# Patient Record
Sex: Female | Born: 1950 | Race: White | Hispanic: No | Smoking: Never smoker
Health system: Southern US, Community
[De-identification: ages and names within clinical notes are randomized; demographics above are authoritative.]

## PROBLEM LIST (undated history)

## (undated) DIAGNOSIS — F988 Other specified behavioral and emotional disorders with onset usually occurring in childhood and adolescence: Secondary | ICD-10-CM

## (undated) DIAGNOSIS — M810 Age-related osteoporosis without current pathological fracture: Secondary | ICD-10-CM

## (undated) DIAGNOSIS — I10 Essential (primary) hypertension: Secondary | ICD-10-CM

## (undated) DIAGNOSIS — F419 Anxiety disorder, unspecified: Secondary | ICD-10-CM

## (undated) DIAGNOSIS — Z9889 Other specified postprocedural states: Secondary | ICD-10-CM

## (undated) DIAGNOSIS — E559 Vitamin D deficiency, unspecified: Secondary | ICD-10-CM

## (undated) DIAGNOSIS — F329 Major depressive disorder, single episode, unspecified: Secondary | ICD-10-CM

## (undated) DIAGNOSIS — E785 Hyperlipidemia, unspecified: Secondary | ICD-10-CM

## (undated) DIAGNOSIS — E039 Hypothyroidism, unspecified: Secondary | ICD-10-CM

## (undated) DIAGNOSIS — K219 Gastro-esophageal reflux disease without esophagitis: Secondary | ICD-10-CM

## (undated) DIAGNOSIS — R112 Nausea with vomiting, unspecified: Secondary | ICD-10-CM

## (undated) DIAGNOSIS — F32A Depression, unspecified: Secondary | ICD-10-CM

## (undated) DIAGNOSIS — T7840XA Allergy, unspecified, initial encounter: Secondary | ICD-10-CM

## (undated) DIAGNOSIS — G4730 Sleep apnea, unspecified: Secondary | ICD-10-CM

## (undated) DIAGNOSIS — M1990 Unspecified osteoarthritis, unspecified site: Secondary | ICD-10-CM

## (undated) DIAGNOSIS — D649 Anemia, unspecified: Secondary | ICD-10-CM

## (undated) HISTORY — DX: Unspecified osteoarthritis, unspecified site: M19.90

## (undated) HISTORY — DX: Vitamin D deficiency, unspecified: E55.9

## (undated) HISTORY — DX: Allergy, unspecified, initial encounter: T78.40XA

## (undated) HISTORY — DX: Gastro-esophageal reflux disease without esophagitis: K21.9

## (undated) HISTORY — DX: Depression, unspecified: F32.A

## (undated) HISTORY — DX: Anemia, unspecified: D64.9

## (undated) HISTORY — PX: FRACTURE SURGERY: SHX138

## (undated) HISTORY — DX: Essential (primary) hypertension: I10

## (undated) HISTORY — DX: Hypothyroidism, unspecified: E03.9

## (undated) HISTORY — DX: Hyperlipidemia, unspecified: E78.5

## (undated) HISTORY — DX: Other specified behavioral and emotional disorders with onset usually occurring in childhood and adolescence: F98.8

## (undated) HISTORY — DX: Age-related osteoporosis without current pathological fracture: M81.0

## (undated) HISTORY — DX: Major depressive disorder, single episode, unspecified: F32.9

## (undated) HISTORY — DX: Anxiety disorder, unspecified: F41.9

## (undated) HISTORY — DX: Sleep apnea, unspecified: G47.30

## (undated) HISTORY — PX: FOOT SURGERY: SHX648

---

## 1998-01-02 ENCOUNTER — Other Ambulatory Visit: Admission: RE | Admit: 1998-01-02 | Discharge: 1998-01-02 | Payer: Self-pay | Admitting: Orthopedic Surgery

## 1998-06-04 ENCOUNTER — Other Ambulatory Visit: Admission: RE | Admit: 1998-06-04 | Discharge: 1998-06-04 | Payer: Self-pay | Admitting: *Deleted

## 1998-11-25 ENCOUNTER — Encounter: Payer: Self-pay | Admitting: Pulmonary Disease

## 1998-11-25 ENCOUNTER — Ambulatory Visit: Admission: RE | Admit: 1998-11-25 | Discharge: 1998-11-25 | Payer: Self-pay | Admitting: Otolaryngology

## 1999-01-15 ENCOUNTER — Encounter: Payer: Self-pay | Admitting: Pulmonary Disease

## 1999-06-03 ENCOUNTER — Encounter (INDEPENDENT_AMBULATORY_CARE_PROVIDER_SITE_OTHER): Payer: Self-pay | Admitting: Specialist

## 1999-06-03 ENCOUNTER — Other Ambulatory Visit: Admission: RE | Admit: 1999-06-03 | Discharge: 1999-06-03 | Payer: Self-pay | Admitting: *Deleted

## 1999-08-19 ENCOUNTER — Other Ambulatory Visit: Admission: RE | Admit: 1999-08-19 | Discharge: 1999-08-19 | Payer: Self-pay | Admitting: *Deleted

## 2000-02-22 HISTORY — PX: TONSILLECTOMY: SUR1361

## 2000-08-03 ENCOUNTER — Encounter (INDEPENDENT_AMBULATORY_CARE_PROVIDER_SITE_OTHER): Payer: Self-pay | Admitting: *Deleted

## 2000-08-03 ENCOUNTER — Ambulatory Visit (HOSPITAL_COMMUNITY): Admission: RE | Admit: 2000-08-03 | Discharge: 2000-08-04 | Payer: Self-pay | Admitting: *Deleted

## 2000-09-26 ENCOUNTER — Other Ambulatory Visit: Admission: RE | Admit: 2000-09-26 | Discharge: 2000-09-26 | Payer: Self-pay | Admitting: *Deleted

## 2000-11-02 ENCOUNTER — Ambulatory Visit (HOSPITAL_BASED_OUTPATIENT_CLINIC_OR_DEPARTMENT_OTHER): Admission: RE | Admit: 2000-11-02 | Discharge: 2000-11-02 | Payer: Self-pay | Admitting: *Deleted

## 2001-01-08 ENCOUNTER — Inpatient Hospital Stay (HOSPITAL_COMMUNITY): Admission: EM | Admit: 2001-01-08 | Discharge: 2001-01-17 | Payer: Self-pay

## 2001-01-08 ENCOUNTER — Encounter: Payer: Self-pay | Admitting: Orthopedic Surgery

## 2001-01-09 ENCOUNTER — Encounter: Payer: Self-pay | Admitting: Orthopedic Surgery

## 2001-01-09 HISTORY — PX: IM NAILING TIBIA: SUR734

## 2001-01-10 ENCOUNTER — Encounter: Payer: Self-pay | Admitting: Orthopedic Surgery

## 2001-01-10 ENCOUNTER — Encounter: Payer: Self-pay | Admitting: Dentistry

## 2001-09-26 ENCOUNTER — Other Ambulatory Visit: Admission: RE | Admit: 2001-09-26 | Discharge: 2001-09-26 | Payer: Self-pay | Admitting: *Deleted

## 2002-06-20 ENCOUNTER — Ambulatory Visit (HOSPITAL_COMMUNITY): Admission: RE | Admit: 2002-06-20 | Discharge: 2002-06-20 | Payer: Self-pay | Admitting: Gastroenterology

## 2002-09-30 ENCOUNTER — Other Ambulatory Visit: Admission: RE | Admit: 2002-09-30 | Discharge: 2002-09-30 | Payer: Self-pay | Admitting: *Deleted

## 2003-03-18 ENCOUNTER — Ambulatory Visit (HOSPITAL_COMMUNITY): Admission: RE | Admit: 2003-03-18 | Discharge: 2003-03-18 | Payer: Self-pay | Admitting: *Deleted

## 2003-10-01 ENCOUNTER — Other Ambulatory Visit: Admission: RE | Admit: 2003-10-01 | Discharge: 2003-10-01 | Payer: Self-pay | Admitting: *Deleted

## 2003-10-13 ENCOUNTER — Encounter: Admission: RE | Admit: 2003-10-13 | Discharge: 2003-10-13 | Payer: Self-pay | Admitting: *Deleted

## 2004-04-20 ENCOUNTER — Ambulatory Visit (HOSPITAL_COMMUNITY): Admission: RE | Admit: 2004-04-20 | Discharge: 2004-04-20 | Payer: Self-pay | Admitting: *Deleted

## 2004-10-12 ENCOUNTER — Other Ambulatory Visit: Admission: RE | Admit: 2004-10-12 | Discharge: 2004-10-12 | Payer: Self-pay | Admitting: *Deleted

## 2005-06-07 ENCOUNTER — Ambulatory Visit (HOSPITAL_COMMUNITY): Admission: RE | Admit: 2005-06-07 | Discharge: 2005-06-07 | Payer: Self-pay | Admitting: Family Medicine

## 2005-09-02 ENCOUNTER — Encounter: Admission: RE | Admit: 2005-09-02 | Discharge: 2005-09-02 | Payer: Self-pay | Admitting: Gastroenterology

## 2005-11-03 ENCOUNTER — Other Ambulatory Visit: Admission: RE | Admit: 2005-11-03 | Discharge: 2005-11-03 | Payer: Self-pay | Admitting: *Deleted

## 2005-12-20 ENCOUNTER — Encounter: Admission: RE | Admit: 2005-12-20 | Discharge: 2005-12-20 | Payer: Self-pay | Admitting: *Deleted

## 2006-08-14 ENCOUNTER — Ambulatory Visit (HOSPITAL_COMMUNITY): Admission: RE | Admit: 2006-08-14 | Discharge: 2006-08-14 | Payer: Self-pay | Admitting: *Deleted

## 2006-11-21 ENCOUNTER — Other Ambulatory Visit: Admission: RE | Admit: 2006-11-21 | Discharge: 2006-11-21 | Payer: Self-pay | Admitting: *Deleted

## 2007-04-24 ENCOUNTER — Emergency Department (HOSPITAL_COMMUNITY): Admission: EM | Admit: 2007-04-24 | Discharge: 2007-04-24 | Payer: Self-pay | Admitting: Family Medicine

## 2007-08-08 ENCOUNTER — Encounter: Admission: RE | Admit: 2007-08-08 | Discharge: 2007-08-08 | Payer: Self-pay | Admitting: Gastroenterology

## 2007-08-28 ENCOUNTER — Ambulatory Visit (HOSPITAL_COMMUNITY): Admission: RE | Admit: 2007-08-28 | Discharge: 2007-08-28 | Payer: Self-pay | Admitting: *Deleted

## 2007-09-12 ENCOUNTER — Encounter: Admission: RE | Admit: 2007-09-12 | Discharge: 2007-09-12 | Payer: Self-pay | Admitting: Gastroenterology

## 2007-10-23 LAB — CONVERTED CEMR LAB: Pap Smear: NORMAL

## 2007-11-16 ENCOUNTER — Ambulatory Visit: Payer: Self-pay | Admitting: Pulmonary Disease

## 2007-11-16 DIAGNOSIS — R51 Headache: Secondary | ICD-10-CM

## 2007-11-16 DIAGNOSIS — Z86718 Personal history of other venous thrombosis and embolism: Secondary | ICD-10-CM | POA: Insufficient documentation

## 2007-11-16 DIAGNOSIS — J309 Allergic rhinitis, unspecified: Secondary | ICD-10-CM | POA: Insufficient documentation

## 2007-11-16 DIAGNOSIS — G4733 Obstructive sleep apnea (adult) (pediatric): Secondary | ICD-10-CM | POA: Insufficient documentation

## 2007-11-22 ENCOUNTER — Encounter: Payer: Self-pay | Admitting: Pulmonary Disease

## 2007-11-22 ENCOUNTER — Ambulatory Visit (HOSPITAL_BASED_OUTPATIENT_CLINIC_OR_DEPARTMENT_OTHER): Admission: RE | Admit: 2007-11-22 | Discharge: 2007-11-22 | Payer: Self-pay | Admitting: Pulmonary Disease

## 2007-12-15 ENCOUNTER — Ambulatory Visit: Payer: Self-pay | Admitting: Pulmonary Disease

## 2007-12-17 ENCOUNTER — Telehealth (INDEPENDENT_AMBULATORY_CARE_PROVIDER_SITE_OTHER): Payer: Self-pay | Admitting: *Deleted

## 2007-12-26 ENCOUNTER — Ambulatory Visit: Payer: Self-pay | Admitting: Pulmonary Disease

## 2008-01-16 ENCOUNTER — Telehealth (INDEPENDENT_AMBULATORY_CARE_PROVIDER_SITE_OTHER): Payer: Self-pay | Admitting: *Deleted

## 2008-01-21 ENCOUNTER — Ambulatory Visit: Payer: Self-pay | Admitting: Pulmonary Disease

## 2008-03-21 ENCOUNTER — Encounter: Payer: Self-pay | Admitting: Pulmonary Disease

## 2008-04-25 ENCOUNTER — Ambulatory Visit: Payer: Self-pay | Admitting: Internal Medicine

## 2008-04-25 DIAGNOSIS — D509 Iron deficiency anemia, unspecified: Secondary | ICD-10-CM | POA: Insufficient documentation

## 2008-04-25 DIAGNOSIS — E039 Hypothyroidism, unspecified: Secondary | ICD-10-CM | POA: Insufficient documentation

## 2008-04-25 LAB — CONVERTED CEMR LAB
ALT: 22 units/L (ref 0–35)
AST: 17 units/L (ref 0–37)
Albumin: 4 g/dL (ref 3.5–5.2)
Alkaline Phosphatase: 95 units/L (ref 39–117)
BUN: 9 mg/dL (ref 6–23)
Bacteria, UA: NEGATIVE
Basophils Absolute: 0.1 10*3/uL (ref 0.0–0.1)
Basophils Relative: 1.7 % (ref 0.0–3.0)
Bilirubin Urine: NEGATIVE
Bilirubin, Direct: 0.1 mg/dL (ref 0.0–0.3)
CO2: 32 meq/L (ref 19–32)
Calcium: 9.6 mg/dL (ref 8.4–10.5)
Chloride: 104 meq/L (ref 96–112)
Cholesterol: 233 mg/dL (ref 0–200)
Creatinine, Ser: 0.9 mg/dL (ref 0.4–1.2)
Crystals: NEGATIVE
Direct LDL: 138.2 mg/dL
Eosinophils Absolute: 0 10*3/uL (ref 0.0–0.7)
Eosinophils Relative: 0.3 % (ref 0.0–5.0)
GFR calc Af Amer: 83 mL/min
GFR calc non Af Amer: 69 mL/min
Glucose, Bld: 93 mg/dL (ref 70–99)
HCT: 39.5 % (ref 36.0–46.0)
HDL: 48.6 mg/dL (ref >39.0)
Hemoglobin, Urine: NEGATIVE
Hemoglobin: 13.7 g/dL (ref 12.0–15.0)
Ketones, ur: NEGATIVE mg/dL
Lymphocytes Relative: 25.3 % (ref 12.0–46.0)
MCHC: 34.7 g/dL (ref 30.0–36.0)
MCV: 87.1 fL (ref 78.0–100.0)
Monocytes Absolute: 0.5 10*3/uL (ref 0.1–1.0)
Monocytes Relative: 7.1 % (ref 3.0–12.0)
Mucus, UA: NEGATIVE
Neutro Abs: 4.3 10*3/uL (ref 1.4–7.7)
Neutrophils Relative %: 65.6 % (ref 43.0–77.0)
Nitrite: NEGATIVE
Platelets: 282 10*3/uL (ref 150–400)
Potassium: 3.7 meq/L (ref 3.5–5.1)
RBC / HPF: NONE SEEN
RBC: 4.53 M/uL (ref 3.87–5.11)
RDW: 12.7 % (ref 11.5–14.6)
Sodium: 143 meq/L (ref 135–145)
Specific Gravity, Urine: <=1.005 (ref 1.000–1.035)
TSH: 0.45 microintl units/mL (ref 0.35–5.50)
Total Bilirubin: 0.8 mg/dL (ref 0.3–1.2)
Total CHOL/HDL Ratio: 4.8
Total Protein, Urine: NEGATIVE mg/dL
Total Protein: 6.4 g/dL (ref 6.0–8.3)
Triglycerides: 217 mg/dL (ref 0–149)
Urine Glucose: NEGATIVE mg/dL
Urobilinogen, UA: 0.2 (ref 0.0–1.0)
VLDL: 43 mg/dL — ABNORMAL HIGH (ref 0–40)
WBC: 6.6 10*3/uL (ref 4.5–10.5)
pH: 6.5 (ref 5.0–8.0)

## 2008-04-28 LAB — CONVERTED CEMR LAB: Vit D, 25-Hydroxy: 26 ng/mL — ABNORMAL LOW (ref 30–89)

## 2008-09-16 ENCOUNTER — Ambulatory Visit (HOSPITAL_COMMUNITY): Admission: RE | Admit: 2008-09-16 | Discharge: 2008-09-16 | Payer: Self-pay | Admitting: Internal Medicine

## 2008-12-06 ENCOUNTER — Encounter: Admission: RE | Admit: 2008-12-06 | Discharge: 2008-12-06 | Payer: Self-pay | Admitting: Family Medicine

## 2009-10-21 ENCOUNTER — Ambulatory Visit (HOSPITAL_COMMUNITY): Admission: RE | Admit: 2009-10-21 | Discharge: 2009-10-21 | Payer: Self-pay | Admitting: Family Medicine

## 2010-03-25 NOTE — Consult Note (Signed)
Summary: Sleep Medicine/[facility]  Sleep Medicine/[facility]   Imported By: Esmeralda Links D'jimraou 11/19/2007 13:28:08  _____________________________________________________________________  External Attachment:    Type:   Image     Comment:   External Document

## 2010-03-25 NOTE — Progress Notes (Signed)
Summary: fax request/ surgery pend  Phone Note From Other Clinic   Caller: Nicholos Johns @ [facility] Call For: clance Summary of Call: needs sleep study and ov faxed to them as pt has surgery pending next week.  fax to attnNicholos Johns at [phone]. office ph# is [phone] Initial call taken by: Tivis Ringer,  January 16, 2008 11:34 AM  Follow-up for Phone Call        Faxed last OV and sleep study report to fax number provided. Attempted to notify Nicholos Johns.  [facility] records faxed.  Follow-up by: Cloyde Reams RN,  January 16, 2008 11:51 AM

## 2010-03-25 NOTE — Progress Notes (Signed)
Summary: needs ov with kc to discuss sleep study results  ---- Converted from flag ---- ---- 12/17/2007 9:58 AM, Cyndia Diver LPN wrote: pt needs ov with KC to discuss sleep study results. ------------------------------  LMOMTCB.  Cyndia Diver LPN  December 17, 2007 1:47 PM  pt is scheduled to see dr. Shelle Iron nov 4 at 12:00pm.  Cyndia Diver LPN  December 17, 2007 2:18 PM

## 2010-03-25 NOTE — Assessment & Plan Note (Signed)
Summary: NEW TO ESTABLISH/[employer]/$50/CD   Vital Signs:  Patient Profile:   60 Years Old Female Weight:      155.50 pounds O2 Sat:      92 % Temp:     97.6 degrees F oral Pulse rate:   72 / minute BP sitting:   132 / 80  (right arm) Cuff size:   regular  Vitals Entered By: Windell Norfolk (April 25, 2008 1:15 PM)                 Preventive Care Screening  Last Tetanus Booster:    Date:  04/25/2008    Next Due:  04/2018    Results:  Tdap  Colonoscopy:    Date:  02/21/2002    Next Due:  02/2012    Results:  normal   Bone Density:    Date:  11/22/2006    Next Due:  11/2008    Results:  normal std dev  Last Flu Shot:    Date:  11/22/2007    Results:  given   Pap Smear:    Date:  10/23/2007    Results:  normal   Mammogram:    Date:  07/23/2007    Results:  normal      declines h1n1 shot   Referred by:  Self Referral  Chief Complaint:  new pt .  History of Present Illness: overall doing well - did notice some lumps to the back of the neck she thinks may be lymph nodes but not sure; no URI symtoms or other in the recent past; no recent cxr; no wt loss, night sweats or other constitutional symptoms, good compliance with meds     Updated Prior Medication List: NEXIUM 40 MG  CPDR (ESOMEPRAZOLE MAGNESIUM) Take 1 tablet by mouth two times a day WELLBUTRIN XL 300 MG XR24H-TAB (BUPROPION HCL) Take 1 tablet by mouth once a day FEXOFENADINE HCL 60 MG TABS (FEXOFENADINE HCL) Take 1 tablet by mouth once a day ADDERALL 15 MG TABS (AMPHETAMINE-DEXTROAMPHETAMINE) Take 1 tablet by mouth once a day as needed when working and driving SYNTHROID 161 MCG TABS (LEVOTHYROXINE SODIUM) Take 1 tablet by mouth once a day  Current Allergies (reviewed today): ! MORPHINE ! VICODIN ! LEVAQUIN ! * YELLOW FOOD DYE ! 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) after leg fracture  ALLERGIC RHINITIS (ICD-477.9)       History of OSA, treated with surgery       Hypothyroidism       Anemia-iron deficiency       GERD       Depression - dr Nolen Mu  Past Surgical History:    Reviewed history from 11/16/2007 and no changes required:       status post NSR and UP3 (nasal septal repair)       Caesarean section       s/p foot neuroma       s/p ORIF leg after left tib/fib fx       s/p ovary cyst removed   Family History:    Reviewed history from 11/16/2007 and no changes required:       allergies: mother, sister       asthma: sister, mother       heart disease: father       cancer: father( prostate) mother (breast)       Copd:  sister. 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[employer]       Alcohol use-yes       1 child    Risk Factors:  Tobacco use:  never Alcohol use:  yes  PAP Smear History:     Date of Last PAP Smear:  10/23/2007    Results:  normal   Mammogram History:     Date of Last Mammogram:  07/23/2007    Results:  normal   Colonoscopy History:     Date of Last Colonoscopy:  02/21/2002    Results:  normal    Review of Systems  The patient denies anorexia, fever, weight loss, weight gain, vision loss, decreased hearing, hoarseness, chest pain, syncope, dyspnea on exertion, peripheral edema, prolonged cough, headaches, hemoptysis, abdominal pain, melena, hematochezia, severe indigestion/heartburn, hematuria, incontinence, muscle weakness, suspicious skin lesions, transient blindness, difficulty walking, depression, unusual weight change, abnormal bleeding, enlarged lymph nodes, angioedema, and breast masses.         all otherwise negative ,    Physical Exam  General:     alert and  overweight-appearing  (mildly) Head:     Normocephalic and atraumatic without obvious abnormalities. No apparent alopecia or balding. Eyes:     No corneal or conjunctival inflammation noted. EOMI. Perrla Ears:     External ear exam shows no significant lesions or deformities.  Otoscopic examination reveals clear canals, tympanic membranes are intact bilaterally without bulging, retraction, inflammation or discharge. Hearing is grossly normal bilaterally. Nose:     External nasal examination shows no deformity or inflammation. Nasal mucosa are pink and moist without lesions or exudates. Mouth:     Oral mucosa and oropharynx without lesions or exudates.  Neck:     supple, but 4 discrete masses noted - 2 just behind the right SCM in the post chain mid aspect (1 "pea" sized, firm and mobile, second slightly large approx 3/4 cm firm but not hard, mobile); 2 other masses noted to the are post to the Left SCM mild aspec - 1 mass subcutaneously firm, mobile and 1/2 cm, but another quite a bit larger and not contiguous, approx 1.5 cm, mobile and firm (not hard or rubbery) - all just Subcutaneously it seems  - no other LA noted Lungs:     normal respiratory effort and normal breath sounds.   Heart:     normal rate and regular rhythm.   Abdomen:     soft, non-tender, and normal bowel sounds.  no organomegaly Msk:     no joint tenderness and no joint swelling.   Extremities:     no edema, no ulcers  Neurologic:     cranial nerves II-XII intact and strength normal in all extremities.   Cervical Nodes:     as above, o/w neeg Axillary Nodes:     no R axillary adenopathy and no L axillary adenopathy.   Inguinal Nodes:     no R inguinal adenopathy and no L inguinal adenopathy.   Psych:     tense in demeanor, moderately anxious.      Impression & Recommendations:  Problem # 1:  Preventive Health Care (ICD-V70.0) Overall doing well, up to date, counseled on routine health concerns for screening  and prevention, immunizations up to date or declined, labs ordered   Orders: T-Vitamin D (25-Hydroxy) (04540-98119) TLB-BMP (Basic Metabolic Panel-BMET) (80048-METABOL) TLB-CBC Platelet - w/Differential (85025-CBCD) TLB-Hepatic/Liver Function Pnl (80076-HEPATIC) TLB-Lipid Panel (80061-LIPID) TLB-TSH (Thyroid Stimulating Hormone) (84443-TSH) TLB-Udip ONLY (81003-UDIP)   Problem # 2:  SWELLING MASS OR LUMP IN HEAD AND NECK (ICD-784.2) for cxr today, unclear etiology but somewhat suspicious as she has had this for > 2 mo, and one lump somewhat larger - she absolutetly refuses referral to ENT or gen surgury at this time, but will do the cxr Orders: T-2 View CXR, Same Day (71020.5TC)   Complete Medication List: 1)  Nexium 40 Mg Cpdr (Esomeprazole magnesium) .... Take 1 tablet by mouth two times a day 2)  Wellbutrin Xl 300 Mg Xr24h-tab (Bupropion hcl) .... Take 1 tablet by mouth once a day 3)  Fexofenadine Hcl 60 Mg Tabs (Fexofenadine hcl) .... Take 1 tablet by mouth once a day 4)  Adderall 15 Mg Tabs (Amphetamine-dextroamphetamine) .... Take 1 tablet by mouth once a day as needed when working and driving 5)  Synthroid 147 Mcg Tabs (Levothyroxine sodium) .... Take 1 tablet by mouth once a day  Other Orders: Tdap => 73yrs IM (82956) Admin 1st Vaccine (21308) TLB-Udip w/ Micro (81001-URINE)   Patient Instructions: 1)  Please go to Radiology in the basement level for your X-Ray today  2)  Please go to the Lab in the basement for your blood tests today 3)  Continue all medications that you may have been taking previously  4)  please continue your followup with your specialists as you do 5)  Please schedule a follow-up appointment in 1 year or sooner if needed     Tetanus/Td Vaccine    Vaccine Type: Tdap    Site: right deltoid    Mfr: [email or web address]    Dose: 0.5 ml    Route: IM    Given by: Windell Norfolk    Exp. Date: 04/16/2010    Lot #: [id number]

## 2010-03-25 NOTE — Assessment & Plan Note (Signed)
Summary: self referral for osa   Referred by:  Self Referral  Chief Complaint:  Sleep Consult.  History of Present Illness: the patient is a 60 year old female who comes in today for evaluation of sleep apnea and daytime hypersomnia. She has been diagnosed with obstructive sleep apnea that was moderate in nature with an RDI of 25 events per hour. This was done in October 2000. The patient elected to undergo nasal septal reconstruction as well as UP3. The patient did feel better after the surgery, but it is unclear to her whether it resolved totally her sleep apnea. The patient has been told currently that she has loud snoring, but does not know if she has pauses in her breathing during sleep. She typically goes to bed at 11 PM, and arises at 6 AM to start her day. She is not rested upon arising. She complains of 4-5 awakenings a night for unknown reasons. The patient works in [employer], and notes significant daytime sleepiness with periods of in activity and driving. She has been placed on Adderall by a psychiatric nurse practitioner, and this has helped to some degree. The patient does not have difficulty getting through movies or TV programs in the evening. Of note, the patient's weight is neutral from her sleep study in 2000. Her Epworth score today is 14.     Current Allergies: ! MORPHINE ! VICODIN ! LEVAQUIN ! * YELLOW FOOD DYE ! 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     pt is divorced.   Risk Factors:  Tobacco use:  never   Review of  Systems      See HPI   Vital Signs:  Patient Profile:   60 Years Old Female Weight:      153 pounds O2 Sat:      97 % O2 treatment:    Room Air Temp:     97.6 degrees F oral Pulse rate:   84 / minute BP sitting:   128 / 86  (left arm) Cuff size:   regular  Vitals Entered By: Cyndia Diver LPN (November 16, 2007 11:32 AM)             Is Patient Diabetic? No Comments Medications reviewed with patient Cyndia Diver LPN  November 16, 2007 11:32 AM      Physical Exam  General:     mildly overweight female in no acute distress Eyes:     PERRLA and EOMI.   Nose:     deviated septum to the right with partial obstruction Mouth:     trimmed palate and no significant narrowing posteriorly Neck:     no JVD, thyromegaly, or lymphadenopathy. Lungs:     clear to auscultation Heart:     regular rate and rhythm, no MRG Abdomen:     soft and nontender, bowel sounds present Extremities:     no significant edema, pulses intact distally Neurologic:     alert and  oriented, moves all 4 extremities      Impression & Recommendations:  Problem # 1:  OBSTRUCTIVE SLEEP APNEA (ICD-327.23) the patient has a history of moderate obstructive sleep apnea that was treated with upper airway surgery 9 years ago. Now she is having increasing symptoms during the day, and nonrestorative sleep. It is unclear whether this is due to sleep apnea or not, and I also wonder about the possibility of depression playing a role. At this point, she really needs to have a repeat sleep study in order to document whether sleep apnea is still an issue for her. This can also look for other things which may disrupt her sleep.  Medications Added to Medication List This Visit: 1)  Nexium 40 Mg Cpdr (Esomeprazole magnesium) .... Take 1 tablet by mouth two times a day 2)  Wellbutrin Xl 300 Mg Xr24h-tab (Bupropion hcl) .... Take 1 tablet by mouth once a day 3)  Fexofenadine Hcl 60 Mg Tabs (Fexofenadine hcl) .... Take  1 tablet by mouth once a day 4)  Adderall 15 Mg Tabs (Amphetamine-dextroamphetamine) .... Take 1 tablet by mouth once a day as needed when working and driving 5)  Abx For Sinus Infection    Patient Instructions: 1)  will schedule for sleep study 2)  will call for f/u apptm once the results are available.   ]

## 2010-03-25 NOTE — Assessment & Plan Note (Signed)
Summary: rov to discuss npsg/osa   Referred by:  Self Referral  Chief Complaint:  Pt is here for a f/u appt to discuss sleep study results. .  History of Present Illness: the pt comes in today for f/u of her recent sleep study.  She was found to have an ahi of 5/hr, and a rdi of 12/hr.  There was oxygen desaturation as low as 77%.  I have gone over the study in great detail with her, and answered all questions.     Prior Medications Reviewed Using: Patient Recall  Updated Prior Medication List: NEXIUM 40 MG  CPDR (ESOMEPRAZOLE MAGNESIUM) Take 1 tablet by mouth two times a day WELLBUTRIN XL 300 MG XR24H-TAB (BUPROPION HCL) Take 1 tablet by mouth once a day FEXOFENADINE HCL 60 MG TABS (FEXOFENADINE HCL) Take 1 tablet by mouth once a day ADDERALL 15 MG TABS (AMPHETAMINE-DEXTROAMPHETAMINE) Take 1 tablet by mouth once a day as needed when working and driving * ABX FOR SINUS INFECTION  SYNTHROID 125 MCG TABS (LEVOTHYROXINE SODIUM) Take 1 tablet by mouth once a day  Current Allergies (reviewed today): ! MORPHINE ! VICODIN ! LEVAQUIN ! * YELLOW FOOD DYE ! * CEFZIL      Vital Signs:  Patient Profile:   60 Years Old Female Weight:      155.13 pounds O2 Sat:      99 % O2 treatment:    Room Air Temp:     98.0 degrees F oral Pulse rate:   89 / minute BP sitting:   128 / 70  (right arm) Cuff size:   regular  Vitals Entered By: Cyndia Diver LPN (December 26, 2007 12:06 PM)             Is Patient Diabetic? No Comments Medications reviewed with patient Cyndia Diver LPN  December 26, 2007 12:06 PM      Physical Exam  General:     wd female in nad      Impression & Recommendations:  Problem # 1:  OBSTRUCTIVE SLEEP APNEA (ICD-327.23) the pt has mild osa despite surgery in the past.  I suspect it is responsible for her current symptoms, although I can't be 100% sure.  I discussed various treatment options available, including dental appliance and cpap.  She uses a  bruxism guard periodically, and really cannot tolerate it.  She would not be able to tolerate a dental applaince if this is the case.  She is willing to try cpap to see if it helps her.  If it does not, I suspect a lot of her symptoms may be due to depression.  Medications Added to Medication List This Visit: 1)  Synthroid 125 Mcg Tabs (Levothyroxine sodium) .... Take 1 tablet by mouth once a day   Patient Instructions: 1)  will set up on cpap as a trial.  please call if having issues. 2)  f/u with me in 4 weeks.   ]

## 2010-07-06 NOTE — Procedures (Signed)
NAMEMANNA, Diana Wolfe             ACCOUNT NO.:  [id number]   MEDICAL RECORD NO.:  [id number]          PATIENT TYPE:  OUT   LOCATION:  SLEEP CENTER                 FAC[facility]  [facility]   PHYSICIAN:  Barbaraann Share, MD,FCCPDATE OF BIRTH:  [birth year]   DATE OF STUDY:  11/22/2007                            NOCTURNAL POLYSOMNOGRAM   REFERRING PHYSICIAN:  Barbaraann Share, MD,FCCP   INDICATION FOR STUDY:  Hypersomnia with sleep apnea.   EPWORTH SLEEPINESS SCORE:  13.   MEDICATIONS:   SLEEP ARCHITECTURE:  The patient had a total sleep time of 334 minutes  with no slow wave sleep and significantly decreased REM.  Sleep onset  latency was mildly prolonged at 31 minutes, and REM onset was very  prolonged at 232 minutes.  Sleep efficiency was decreased at 72%.   RESPIRATORY DATA:  The patient was found to have 3 apneas and 24  hypopneas, for an AHI of 5 events per hour.  She was also found to have  40 respiratory effort related arousals, resulting in a respiratory  disturbance index of 12 events per hour.  The events occurred primarily  during REM and supine sleep, and there was mild snoring noted  throughout.   OXYGEN DATA:  There was O2 desaturation as low as 77% with the patient's  obstructive events.   CARDIAC DATA:  No clinically significant arrhythmias were noted.   MOVEMENT/PARASOMNIA:  The patient had no significant leg jerks or  abnormal behaviors.   IMPRESSION/RECOMMENDATIONS:  Mild obstructive sleep apnea with an AHI of  5 events per hour, and an RDI of 12 events per hour.  There was O2  desaturation as low as 77%.  Treatment for this degree of sleep apnea  can include weight loss alone if applicable, upper airway surgery, oral  appliance, and also continuous positive airway  pressure.  The decision to treat this mild degree of sleep apnea should  be dependent upon its impact on the patient's quality of life.      Barbaraann Share, MD,FCCP  Diplomate, American Board of  Sleep  Medicine  Electronically Signed     [facility]/MEDQ  D:  12/15/2007 17:13:08  T:  12/16/2007 03:10:47  Job:  [id number]

## 2010-07-09 NOTE — Op Note (Signed)
   NAME:  Diana Wolfe, Diana Wolfe                       ACCOUNT NO.:  [id number]   MEDICAL RECORD NO.:  [id number]                   PATIENT TYPE:  AMB   LOCATION:  ENDO                                 FACILITY:  [facility]   PHYSICIAN:  Petra Kuba, M.D.                 DATE OF BIRTH:  [birth year]   DATE OF PROCEDURE:  06/20/2002  DATE OF DISCHARGE:                                 OPERATIVE REPORT   PROCEDURE:  Colonoscopy.   INDICATIONS FOR PROCEDURE:  Iron deficiency due for screening.   Consent was signed after risks, benefits, methods, and options were  thoroughly discussed multiple times in the past.   MEDICINES USED:  Demerol 75, Versed 8.   DESCRIPTION OF PROCEDURE:  Rectal inspection was pertinent for external  hemorrhoids, small. Digital exam was negative. The pediatric video  adjustable colonoscope was inserted, easily advanced around the colon to the  cecum. This did require some abdominal pressure but no position changes. No  obvious abnormality was seen on insertion. The cecum was identified by the  appendiceal orifice and the ileocecal valve. In fact, the scope was inserted  a short ways into the terminal ileum which was normal. Photo documentation  was obtained. The scope was slowly withdrawn. The prep was adequate. There  was some liquid stool that required washing and suctioning, but on slow  withdrawal through the colon no abnormalities were seen as we slowly  withdrew back to the rectum. Specifically, no polyps, masses, signs of  bleeding, or diverticula. Once back in the rectum, the scope was retroflexed  pertinent for some internal hemorrhoids. The scope was straightened and  readvanced a short ways up the left side of the colon, air was suctioned,  scope removed. The patient tolerated the procedure well. There was no  obvious or immediate complications.   ENDOSCOPIC DIAGNOSES:  1. Internal and external hemorrhoids, small.  2. Otherwise within normal limits to the  terminal ileum.   PLAN:  Go ahead and get a CBC today since it has been a while for having it  rechecked. Decide followup based on above, but otherwise return care to Dr.  __________ for the customary health care maintenance to include yearly  rectals and guaiacs.                                               Petra Kuba, M.D.    MEM/MEDQ  D:  06/20/2002  T:  06/20/2002  Job:  [id number]   cc:   Dr. ________________________

## 2010-07-09 NOTE — Discharge Summary (Signed)
[place]. [facility]  Patient:    Diana Wolfe, Diana Wolfe Visit Number: [id number] MRN: [id number]          Service Type: Attending:  Jearld Adjutant, M.D. Dictated by:   Anise Salvo. Clark, P.A.-C. Adm. Date:  01/08/01                             Discharge Summary  REASON FOR ADMISSION:  This 60 year old female fell during martial arts training, twisting onto her right leg.  She noted a sudden onset of pain, swelling, and inability to bear weight.  She was taken to the emergency room at [facility] on January 08, 2001.  X-rays showed a closed comminuted displaced oblique mid-shaft fracture of the right tibia and right fibula.  She was admitted on January 08, 2001.  PROCEDURES/TREATMENT:  She was taken to the OR on January 09, 2001 for a closed intramedullary nailing of the right tibial with two proximal and two distal interlocking screws.  Procedure was performed under general anesthesia with a right mid-thigh tourniquet and preoperative Ancef IV.  Procedure was performed under fluoroscopic visualization and near-anatomic reapproximation of the proximal distal tibial fragments was achieved.  SIGNIFICANT FINDINGS DURING THE POSTOPERATIVE COURSE:  The patient was noted to have respiratory distress with decreasing O2 saturation and somnolence. She became tachypneic with an O2 saturation dropping to 60, she was given nasal trumpets and O2 at 10 L/min, her saturation increased to 80%.  Arterial blood gas showed a pH of 7.29, a pCO2 of 53, a pO2 of 101, and a bicarb of 23. A pulmonary consult was obtained.  They performed a spiral chest CT which revealed a pulmonary embolus with right lower lobe infiltrates.  She was placed on BiPAP, her PCA was discontinued, and she showed improvement in pulmonary status over the next 2 days.  She was placed on IV heparin and started on Coumadin, heparin was continued until Coumadin was therapeutic at which point the heparin was  discontinued.  She spent the night of November 19 and 20, 2002 in the ICU, was transferred to telemetry floor on January 10, 2001 and maintained there for 1-1/2 days prior to transfer to the fifth floor orthopedic unit.  While on the orthopedic floor, consults were obtained from PT, OT, discharge planning, and [facility] for home health followup and maintenance of her Coumadin and INR after hospitalization.  On January 16, 2001, she was noted to have some swollen lymph nodes on the right side of her neck in the cervical region, this was documented by her nurse, reevaluated on January 17, 2001, noted that the lymphadenopathy was decreasing and was then nontender.  CONDITION ON DISCHARGE:  Her fracture is repaired, it is stable with fixation, and she is tolerating 50-75% weightbearing with a CAM walker and a rolling walker.  Her pulmonary status is stable.  She is having decreasing frequency, occasional shortness of breath with exertion on room air, she is having no shortness of breath at rest on room air.  She is Coumadin for her DVT prophylaxis, her INR has been stable between 2 and 3 for the last 2-3 days, she has been taking 3.5 mg of Coumadin a day.  The right-sided cervical lymphadenopathy has decreased since yesterday, is nontender at this point, and she has no symptoms of a URI.  DISCHARGE INSTRUCTIONS:  She is sent home with a rolling walker and a CAM walker, instructed to weightbear as tolerated.  She is to follow up with [facility] for home health RN/PT/OT.  They are going to follow her Coumadin dosage and INRs.  She is sent home on the following medications.  DISCHARGE MEDICATIONS: 1. Coumadin 1 mg three p.o. q.d. until instructed otherwise by [place]. 2. Tylox 5/500 one p.o. q.4h. p.r.n. pain. 3. She is sent home on her preop dosages of Synthroid, Nexium, and Celexa. 4. She is also sent home with iron supplements and calcium with vitamin D.  DIET INSTRUCTIONS:  Regular diet as  tolerated.  ACTIVITIES:  Weightbearing as tolerated with CAM walker and rolling walker to assist with ambulation.  FOLLOWUP INSTRUCTIONS:  She has followup appointments with Dr. Shelle Iron from pulmonary, with her ENT doctor today to evaluate the lymphadenopathy, and she is to follow up Monday morning with Dr. Renae Fickle at 9 a.m. in his office.  She is also being followed by [place] for home health RN/PT/OT.  Since she is going to be on Coumadin therapy for 6 months the [facility] will arrange for transfer of her Coumadin/INR care to primary care [facility] in the near future. Dictated by:   Anise Salvo. Clark, P.A.-C. Attending:  Jearld Adjutant, M.D. DD:  01/17/01 TD:  01/18/01 Job: [id number]

## 2010-07-09 NOTE — Op Note (Signed)
[place]. [facility]  Patient:    Diana Wolfe, Diana Wolfe                      MRN: [id number] Proc. Date: 08/03/00 Adm. Date:  [id number] Attending:  Carlena Sax CC:         Barbaraann Share, M.D. [facility]   Operative Report  PREOPERATIVE DIAGNOSIS:  Obstructive sleep apnea, tonsil hypertrophy, elongated soft palate and uvula.  POSTOPERATIVE DIAGNOSIS:  Obstructive sleep apnea, tonsil hypertrophy, elongated soft palate and uvula.  OPERATION PERFORMED:  Tonsillectomy and uvulopalatopharyngoplasty using the Harmonic scalpel.  SURGEON:  Veverly Fells. Arletha Grippe, M.D.  ANESTHESIA:  General endotracheal.  INDICATIONS FOR PROCEDURE:  The patient is a 60 year old white female who give a history of daytime somnolence and morning fatigue and loud snoring at night. She is status post nasal septal reconstruction, bilateral functional endoscopic sinus surgery on June 03, 1999.  She has had an inpatient polysomnogram which did reveal an RDI of 25 events per hour with desaturations down to 92%.  She has seen Dr. Marcelyn Bruins and has failed nasal CPAP usage. Physical examination does show elongated soft palate and uvula, moderate tonsillar hypertrophy and based on her history and physical examination and failure of medical therapy, I have recommended proceeding with the above noted surgical procedure.  I have discussed extensively with her and her family the risks and benefits of surgery including the risks of general anesthesia, infection and bleeding and the possibility of velopharyngeal insufficiency with her.  I have entertained any questions, answered them appropriately. Informed consent was then obtained and the patient presents with the above noted procedure.  OPERATIVE FINDINGS:  Bilaterally enlarged tonsils and elongated soft palate and uvula.  DESCRIPTION OF PROCEDURE:  The patient was brought to the operating room and placed in supine position.  General endotracheal  anesthesia was administered via the anesthesiologist, without complication.  The patient was administered 1 gm of Ancef IV x 1 and 10 mg of Decadon IV x 1.  The head of the table was turned 90 degrees.  The patient face was draped in standard fashion.  A Crowe-Davis mouth gag was inserted into the oral cavity.  This was used to retract the mouth open.  First attention was turned to the right tonsil.  A curved Allis clamp was used to grasp the tonsil and retract it medially.  A Harmonic scalpel was then used to dissect the tonsil free from the tonsillar fossa.  Bleeding was controlled with a combination of Harmonic scalpel and suction cautery without difficulty.  The tonsil was then transected at the tongue base, removed from the oral cavity and sent to surgical pathology for permanent section analysis.  Next, attention was turned to the left tonsil. An identical procedure was carried out on this side compared to the right side with identical results.  After this was done, bleeding from the tonsillar fossa was controlled meticulously with suction cautery without difficulty. Next, the uvula and a portion of the soft palate was horizontally transected in such away as to leave enough soft palate behind to prevent velopharyngeal insufficiency.  This was also done in a way to have a posterior pharyngeal flap for flap closure.  This was performed using a Harmonic scalpel without difficulty.  After this was done, the uvula and a portion of the soft palate was removed from the oral cavity without incident.  Bleeding was controlled with the harmonic scalpel without difficulty.  The anterior and posterior  tonsillar pillars and the cut edges of the palate were then reapproximated with multiple interrupted 4-0 Vicryl suture without difficulty.  An orogastric tube was placed.  This was used to decompress the stomach contents.  It was then removed without incident. A total of 4 cc of 0.5% Marcaine solution  with 1:200,000 epinephrine were infiltrated in the palate, anterior tonsillar pillar and tongue base bilaterally.  Crowe-Davis mouth retractor was released and brought out through the oral cavity without incident.  Fluids given through the procedure were approximately 1L of crystalloid.   Estimated blood loss was less than 30 cc.  Urine output was not measured.  There were no drains, no packs.  Specimens sent were tonsils x 2, uvula and portion of soft palate.  The patient tolerated the procedure well without complications, was extubated in the operating room and transferred to the recovery room in stable condition.  Sponge, needle and instrument counts were correct at procedure.  Total duration of procedure was approximately one hour.  The patient will be admitted for overnight recovery.  If she is recovered well, she will be sent home on August 04, 2000.  She will be sent home on Augmentin elixir 400 mg p.o. t.i.d. for 10 days, Tylenol with codeine elixir 250 cc with two refills, 10 to 15 cc p.o. q.4h. p.r.n. pain and Vioxx 50 mg tablets #10 one The abdomen was soft with no tenderness, no hepatosplenomegaly appreciated. p.o. q.d. for 10 days.  She is to be on a liquid and soft diet for two weeks after surgery and light activity.  She and her family were given oral and written instructions.  They are to call for any problems with bleeding, fever or vomiting, pain, reaction to medications or any questions. She will follow up in the office for postoperative check on Thursday June 27 at 1:25 p.m. DD:  08/03/00 TD:  08/03/00 Job: [id number]

## 2010-07-09 NOTE — Op Note (Signed)
[place]. [facility]  Patient:    Diana Wolfe, Diana Wolfe Visit Number: [id number] MRN: [id number]          Service Type: MED Location: MICU 2105 01 Attending Physician:  Drema Pry Dictated by:   Jearld Adjutant, M.D. Proc. Date: 01/09/01 Admit Date:  01/08/2001                             Operative Report  PREOPERATIVE DIAGNOSIS: Closed comminuted, displaced left tibia-fibula fracture just distal to midshaft.  POSTOPERATIVE DIAGNOSIS:  Closed comminuted, displaced left tibia-fibula fracture just distal to midshaft.  PROCEDURE:  Closed intramedullary nailing with two proximal and two distal interlocks with Ace nail.  SURGEON:  Jearld Adjutant, M.D.  ANESTHESIA:  General endotracheal.  CULTURES:  None.  DRAINS:  None.  ESTIMATED BLOOD LOSS:  Less than 100 cc.  REPLACED:  Without.  TOURNIQUET TIME:  1 hour 20 minutes.  PATHOLOGIC FINDINGS AND HISTORY:  Diana Wolfe is a 60 year old female in martial arts yesterday, who was kickboxing, sustaining this fracture.  She was splinted, admitted, and taken to the operating room today, where the closed IM nailing was performed.  We used a 28 cm x 8 mm nail, reamed to 9 mm, and placed two proximal and two distal interlock screws with excellent position and alignment of the fracture on both AP and lateral views.  We slightly countersunk the proximal nail and used an end-tap, and distally we were just above the old epiphyseal line.  DESCRIPTION OF PROCEDURE:  With adequate anesthesia obtained using endotracheal technique, 1 g Ancef given IV prophylaxis, the patient was placed in supine position.  The left lower extremity was prepped from the toes to the upper thigh in the standard fashion.  After standard prepping and draping, Esmarch exsanguination was used and the tourniquet was let up to 350 mmHg.  We then made an incision just medial to the patella.  We dissected down to the medial retinaculum and  incised that longitudinally.  We then started with an awl and then the starter hand reamer and then first tried the sharp guide pin but later used the ball-tip guide pin.  This was _____ and ultimately passed it down and across the fracture.  We then subsequently reamed, and it was rather tight up to a 9, very tight intramedullary canal in the cortical endosteum, but we did pass it.  Then we reamed approximately up to an 11.  We then measured and passed a 28 cm nail down to just above the epiphyseal line and countersunk it.  This was with the fracture held reduced across as the nail went down.  We then with the proximal interlock guide system placed two interlock oblique screws.  The shortest screw we had was a 40, and it was a little long on each side, but nevertheless that was what we had to use, with good position obtained with C-arm fluoroscopy.  The end-cap was then put in place.  We then used the radiolucent guide and placed two distal interlock screws from medial to lateral.  C-arm fluoroscopy confirmed position on AP, lateral, and oblique view proximally, and AP and lateral distally, and also at the fracture site.  We then irrigated all wounds, closed the proximal wound at the knee with #1 Vicryl running on the retinaculum, 2-0 Vicryl on the subcu, and a running 3-0 Monocryl subcuticular with Steri-Strips.  All other small holes for the interlock  screws were closed with 3-0 Monocryl and Steri-Strips and benzoin.  A bulky sterile compressive dressing was applied with Ace and the patient, having tolerated the procedure well, was awakened, taken to the recovery room in satisfactory condition, to be admitted to the floor for analgesia, routine postoperative care, and physical therapy. Dictated by:   Jearld Adjutant, M.D. Attending Physician:  Drema Pry DD:  01/09/01 TD:  01/10/01 Job: [id number]

## 2010-11-15 LAB — INFLUENZA A AND B ANTIGEN (CONVERTED LAB)
Inflenza A Ag: NEGATIVE
Influenza B Ag: NEGATIVE

## 2010-12-08 ENCOUNTER — Other Ambulatory Visit (HOSPITAL_COMMUNITY): Payer: Self-pay | Admitting: Internal Medicine

## 2010-12-08 DIAGNOSIS — Z1231 Encounter for screening mammogram for malignant neoplasm of breast: Secondary | ICD-10-CM

## 2011-01-04 ENCOUNTER — Ambulatory Visit (HOSPITAL_COMMUNITY): Payer: Self-pay

## 2011-01-27 ENCOUNTER — Ambulatory Visit (HOSPITAL_COMMUNITY)
Admission: RE | Admit: 2011-01-27 | Discharge: 2011-01-27 | Disposition: A | Discharge status: Home / Self Care | Payer: PRIVATE HEALTH INSURANCE | Source: Ambulatory Visit | Attending: Internal Medicine | Admitting: Internal Medicine

## 2011-01-27 DIAGNOSIS — Z1231 Encounter for screening mammogram for malignant neoplasm of breast: Secondary | ICD-10-CM

## 2012-04-17 ENCOUNTER — Other Ambulatory Visit (HOSPITAL_COMMUNITY): Payer: Self-pay | Admitting: Internal Medicine

## 2012-04-17 DIAGNOSIS — Z1231 Encounter for screening mammogram for malignant neoplasm of breast: Secondary | ICD-10-CM

## 2012-04-24 ENCOUNTER — Ambulatory Visit (HOSPITAL_COMMUNITY)
Admission: RE | Admit: 2012-04-24 | Discharge: 2012-04-24 | Disposition: A | Discharge status: Home / Self Care | Payer: No Typology Code available for payment source | Source: Ambulatory Visit | Attending: Internal Medicine | Admitting: Internal Medicine

## 2012-04-24 DIAGNOSIS — Z1231 Encounter for screening mammogram for malignant neoplasm of breast: Secondary | ICD-10-CM

## 2013-01-08 ENCOUNTER — Other Ambulatory Visit: Payer: Self-pay | Admitting: Physician Assistant

## 2013-01-08 MED ORDER — FLUCONAZOLE 150 MG PO TABS: 150.0000 mg | ORAL_TABLET | Freq: Once | ORAL | Status: DC

## 2013-03-25 ENCOUNTER — Other Ambulatory Visit: Payer: Self-pay | Admitting: Physician Assistant

## 2013-03-25 MED ORDER — MECLIZINE HCL 25 MG PO TABS: 25.0000 mg | ORAL_TABLET | Freq: Three times a day (TID) | ORAL | Status: AC | PRN

## 2013-06-16 DIAGNOSIS — F329 Major depressive disorder, single episode, unspecified: Secondary | ICD-10-CM | POA: Insufficient documentation

## 2013-06-16 DIAGNOSIS — I10 Essential (primary) hypertension: Secondary | ICD-10-CM | POA: Insufficient documentation

## 2013-06-16 DIAGNOSIS — K219 Gastro-esophageal reflux disease without esophagitis: Secondary | ICD-10-CM | POA: Insufficient documentation

## 2013-06-16 DIAGNOSIS — F32A Depression, unspecified: Secondary | ICD-10-CM | POA: Insufficient documentation

## 2013-06-16 DIAGNOSIS — F988 Other specified behavioral and emotional disorders with onset usually occurring in childhood and adolescence: Secondary | ICD-10-CM | POA: Insufficient documentation

## 2013-06-16 DIAGNOSIS — F419 Anxiety disorder, unspecified: Secondary | ICD-10-CM | POA: Insufficient documentation

## 2013-06-16 DIAGNOSIS — E559 Vitamin D deficiency, unspecified: Secondary | ICD-10-CM | POA: Insufficient documentation

## 2013-06-16 DIAGNOSIS — E785 Hyperlipidemia, unspecified: Secondary | ICD-10-CM | POA: Insufficient documentation

## 2013-06-19 ENCOUNTER — Encounter: Payer: Self-pay | Admitting: Physician Assistant

## 2013-06-19 ENCOUNTER — Ambulatory Visit (INDEPENDENT_AMBULATORY_CARE_PROVIDER_SITE_OTHER): Payer: BC Managed Care – PPO | Admitting: Physician Assistant

## 2013-06-19 VITALS — BP 124/68 | HR 60 | Temp 98.1°F | Resp 16 | Ht 62.5 in | Wt 159.6 lb

## 2013-06-19 DIAGNOSIS — R682 Dry mouth, unspecified: Secondary | ICD-10-CM

## 2013-06-19 DIAGNOSIS — Z79899 Other long term (current) drug therapy: Secondary | ICD-10-CM

## 2013-06-19 DIAGNOSIS — K117 Disturbances of salivary secretion: Secondary | ICD-10-CM

## 2013-06-19 DIAGNOSIS — E559 Vitamin D deficiency, unspecified: Secondary | ICD-10-CM

## 2013-06-19 DIAGNOSIS — R7303 Prediabetes: Secondary | ICD-10-CM

## 2013-06-19 DIAGNOSIS — R7309 Other abnormal glucose: Secondary | ICD-10-CM

## 2013-06-19 DIAGNOSIS — M2550 Pain in unspecified joint: Secondary | ICD-10-CM

## 2013-06-19 DIAGNOSIS — Z0000 Encounter for general adult medical examination without abnormal findings: Secondary | ICD-10-CM

## 2013-06-19 LAB — HEMOGLOBIN A1C
Hgb A1c MFr Bld: 5.8 % — ABNORMAL HIGH (ref <5.7)
Mean Plasma Glucose: 120 mg/dL — ABNORMAL HIGH (ref <117)

## 2013-06-19 LAB — CBC WITH DIFFERENTIAL/PLATELET
Basophils Absolute: 0.1 10*3/uL (ref 0.0–0.1)
Basophils Relative: 1 % (ref 0–1)
Eosinophils Absolute: 0.1 10*3/uL (ref 0.0–0.7)
Eosinophils Relative: 2 % (ref 0–5)
HCT: 46 % (ref 36.0–46.0)
Hemoglobin: 15.9 g/dL — ABNORMAL HIGH (ref 12.0–15.0)
Lymphocytes Relative: 32 % (ref 12–46)
Lymphs Abs: 2.4 10*3/uL (ref 0.7–4.0)
MCH: 29.2 pg (ref 26.0–34.0)
MCHC: 34.6 g/dL (ref 30.0–36.0)
MCV: 84.6 fL (ref 78.0–100.0)
Monocytes Absolute: 0.6 10*3/uL (ref 0.1–1.0)
Monocytes Relative: 8 % (ref 3–12)
Neutro Abs: 4.2 10*3/uL (ref 1.7–7.7)
Neutrophils Relative %: 57 % (ref 43–77)
Platelets: 376 10*3/uL (ref 150–400)
RBC: 5.44 MIL/uL — ABNORMAL HIGH (ref 3.87–5.11)
RDW: 13.6 % (ref 11.5–15.5)
WBC: 7.4 10*3/uL (ref 4.0–10.5)

## 2013-06-19 LAB — SEDIMENTATION RATE: Sed Rate: 1 mm/hr (ref 0–22)

## 2013-06-19 MED ORDER — GABAPENTIN 300 MG PO CAPS: ORAL_CAPSULE | ORAL | Status: DC

## 2013-06-19 MED ORDER — LIDOCAINE 5 % EX PTCH: 1.0000 | MEDICATED_PATCH | CUTANEOUS | Status: DC

## 2013-06-19 MED ORDER — MELOXICAM 15 MG PO TABS: 15.0000 mg | ORAL_TABLET | Freq: Every day | ORAL | Status: DC

## 2013-06-19 MED ORDER — AMPHETAMINE-DEXTROAMPHETAMINE 20 MG PO TABS: 20.0000 mg | ORAL_TABLET | Freq: Every day | ORAL | Status: DC

## 2013-06-19 NOTE — Progress Notes (Signed)
Complete Physical HPI 63 y.o. female  presents for a complete physical. Her blood pressure has been controlled at home, today their BP is BP: 124/68 mmHg She does not workout but she walks a lot for her job. She denies chest pain, shortness of breath, dizziness.  She is not on cholesterol medication and denies myalgias. Her cholesterol is at goal. The cholesterol last visit was:   Lab Results  Component Value Date   CHOL 233* 04/25/2008   HDL 48.6 04/25/2008   LDLDIRECT 138.2 04/25/2008   TRIG 217* 04/25/2008   CHOLHDL 4.8 CALC 04/25/2008   She has been working on diet and exercise for prediabetes, and denies paresthesia of the feet, polydipsia and polyuria. Last A1C in the office was: 5.9 Patient is on Vitamin D supplement.   She is on thyroid medication. Her medication was not changed last visit. Patient denies heat / cold intolerance, nervousness and palpitations.  Lab Results  Component Value Date   TSH 0.45 04/25/2008  .  Patient had shingles on right upper back and states that she thinks she has continuing pain at the area that flares up. She uses lido derm patches occ and they help. She states the pain is burning/stabbing quality. She denies neck pain. Denies numbness/tingling in the right arm.   Current Medications:  Current Outpatient Prescriptions on File Prior to Visit  Medication Sig Dispense Refill  . atenolol (TENORMIN) 50 MG tablet Take 50 mg by mouth daily.      . fluconazole (DIFLUCAN) 150 MG tablet Take 1 tablet (150 mg total) by mouth once.  1 tablet  3  . levothyroxine (SYNTHROID, LEVOTHROID) 125 MCG tablet Take 125 mcg by mouth daily before breakfast.      . meclizine (ANTIVERT) 25 MG tablet Take 1 tablet (25 mg total) by mouth 3 (three) times daily as needed for dizziness or nausea.  30 tablet  1  . meloxicam (MOBIC) 15 MG tablet Take 15 mg by mouth daily.       No current facility-administered medications on file prior to visit.   Health Maintenance:   Immunization  History  Administered Date(s) Administered  . Influenza Whole 11/22/2007  . Td 04/25/2008   Tetanus: 2010 Pneumovax: declines Flu vaccine: 2014 Zostavax: declines Pap  Dr. Comer Locket 03/2012 MGM: 04/2012 DUE DEXA: 2007- would like to wait Colonoscopy: had at age 3, and has scheduled, was due last year but weather did not permit it.  EGD: N/A Eye doctor: April 2014 [facility] Dentist: Dr. Denyse Amass in [place]  Allergies:  Allergies  Allergen Reactions  . Cefprozil   . Codeine     REACTION: vomiting  . Hydrocodone-Acetaminophen   . Levofloxacin   . Morphine    Medical History:  Past Medical History  Diagnosis Date  . Hyperlipidemia   . Hypertension   . Anxiety   . GERD (gastroesophageal reflux disease)   . ADD (attention deficit disorder)   . Depression   . Vitamin D deficiency   . Hypothyroid    Surgical History:  Past Surgical History  Procedure Laterality Date  . Foot surgery Right   . Cesarean section    . Tonsillectomy  2002   Family History:  Family History  Problem Relation Age of Onset  . Heart disease Father   . Hypertension Father    Social History:  History  Substance Use Topics  . Smoking status: Never Smoker   . Smokeless tobacco: Not on file  . Alcohol Use:  Yes     Comment: occasional   Review of Systems: [X]  = complains of  [ ]  = denies  General: Fatigue [ ]  Fever [ ]  Chills [ ]  Weakness [ ]   Insomnia [X ]Weight change [ ]  Night sweats [ ]   Change in appetite [ ]  Eyes: Redness [ ]  Blurred vision [ ]  Diplopia [ ]  Discharge [ ]   ENT: Congestion [ ]  Sinus Pain [ ]  Post Nasal Drip [ ]  Sore Throat [ ]  Earache [ ]  hearing loss [ ]  Tinnitus [ ]  Snoring [ ]   Dry eye and dry throat [X]  Cardiac: Chest pain/pressure [ ]  SOB [ ]  Orthopnea [ ]   Palpitations [ ]   Paroxysmal nocturnal dyspnea[ ]  Claudication [ ]  Edema [ ]   Pulmonary: Cough [ ]  Wheezing[ ]   SOB [ ]   Pleurisy [ ]   GI: Nausea [ ]  Vomiting[ ]  Dysphagia[ ]  Heartburn[ ]  Abdominal pain [ ]   Constipation [ ] ; Diarrhea [ ]  BRBPR [ ]  Melena[ ]  Bloating [ ]  Hemorrhoids [ ]   GU: Hematuria[ ]  Dysuria [ ]  Nocturia[ ]  Urgency [ ]   Hesitancy [ ]  Discharge [ ]  Frequency [ ]   Breast:  Breast lumps [ ]   nipple discharge [ ]    Neuro: Headaches[ ]  Vertigo[ ]  Paresthesias[ ]  Spasm [ ]  Speech changes [ ]  Incoordination [ ]   Ortho: Arthritis [ ]  Joint pain [ ]  Muscle pain [ X] Joint swelling [ ]  Back Pain [ ]  Skin:  Rash [ ]   Pruritis [ ]  Change in skin lesion [ ]   Psych: Depression[ ]  Anxiety[ ]  Confusion [ ]  Memory loss [ ]   Heme/Lypmh: Bleeding [ ]  Bruising [ ]  Enlarged lymph nodes [ ]   Endocrine: Visual blurring [ ]  Paresthesia [ ]  Polyuria [ ]  Polydypsea [ ]    Heat/cold intolerance [ ]  Hypoglycemia [ ]   Physical Exam: Estimated body mass index is 28.71 kg/(m^2) as calculated from the following:   Height as of this encounter: 5' 2.5" (1.588 m).   Weight as of this encounter: 159 lb 9.6 oz (72.394 kg). BP 124/68  Pulse 60  Temp(Src) 98.1 F (36.7 C)  Resp 16  Ht 5' 2.5" (1.588 m)  Wt 159 lb 9.6 oz (72.394 kg)  BMI 28.71 kg/m2 General Appearance: Well nourished, in no apparent distress. Eyes: PERRLA, EOMs, conjunctiva no swelling or erythema, normal fundi and vessels. Sinuses: No Frontal/maxillary tenderness ENT/Mouth: Ext aud canals clear, normal light reflex with TMs without erythema, bulging.  Good dentition. No erythema, swelling, or exudate on post pharynx. Tonsils not swollen or erythematous. Hearing normal.  Neck: Supple, thyroid normal. No bruits Respiratory: Respiratory effort normal, BS equal bilaterally without rales, rhonchi, wheezing or stridor. Cardio: RRR without murmurs, rubs or gallops. Brisk peripheral pulses without edema.  Chest: symmetric, with normal excursions and percussion. Breasts: defer Abdomen: Soft, +BS. Non tender, no guarding, rebound, hernias, masses, or organomegaly. .  Lymphatics: Non tender without lymphadenopathy.  Genitourinary:  defer Musculoskeletal: Full ROM all peripheral extremities,5/5 strength, and normal gait. Skin: Warm, dry without rashes, lesions, ecchymosis.  Neuro: Cranial nerves intact, reflexes equal bilaterally. Normal muscle tone, no cerebellar symptoms. Sensation intact.  Psych: Awake and oriented X 3, normal affect, Insight and Judgment appropriate.   EKG: WNL no changes.  Assessment and Plan: Hyperlipidemia--continue medications, check lipids, decrease fatty foods, increase activity.   Hypertension-- continue medications, DASH diet, exercise and monitor at home. Call if greater than 130/80.   Anxiety- controlled  GERD (gastroesophageal reflux disease)-controlled  ADD (attention deficit disorder)- controlled, script filled  Depression- in remission  Vitamin D deficiency- check level  Hypothyroid--check TSH level, continue medications the same.   PreDM-Discussed general issues about diabetes pathophysiology and management., Educational material distributed., Suggested low cholesterol diet., Encouraged aerobic exercise., Discussed foot care., Reminded to get yearly retinal exam. Right shoulder pain- ? herpatic pain versus neck- gabapentin and lidoderm patch Dry eyes/mouth and unable to wear sleep appliance due to this, some joint pain- check for sjogrens MGM  Colonoscopy  Discussed med's effects and SE's. Screening labs and tests as requested with regular follow-up as recommended.   Quentin MullingAmanda Jemeka Wagler 10:23 AM

## 2013-06-19 NOTE — Patient Instructions (Signed)

## 2013-06-20 LAB — IRON AND TIBC
%SAT: 19 % — ABNORMAL LOW (ref 20–55)
Iron: 78 ug/dL (ref 42–145)
TIBC: 405 ug/dL (ref 250–470)
UIBC: 327 ug/dL (ref 125–400)

## 2013-06-20 LAB — SJOGRENS SYNDROME-B EXTRACTABLE NUCLEAR ANTIBODY: SSB (La) (ENA) Antibody, IgG: <1

## 2013-06-20 LAB — BASIC METABOLIC PANEL WITH GFR
BUN: 12 mg/dL (ref 6–23)
CO2: 27 mEq/L (ref 19–32)
Calcium: 9.9 mg/dL (ref 8.4–10.5)
Chloride: 103 mEq/L (ref 96–112)
Creat: 0.69 mg/dL (ref 0.50–1.10)
GFR, Est African American: >89 mL/min
GFR, Est Non African American: >89 mL/min
Glucose, Bld: 97 mg/dL (ref 70–99)
Potassium: 4.9 mEq/L (ref 3.5–5.3)
Sodium: 141 mEq/L (ref 135–145)

## 2013-06-20 LAB — HEPATIC FUNCTION PANEL
ALT: 22 U/L (ref 0–35)
AST: 17 U/L (ref 0–37)
Albumin: 4.6 g/dL (ref 3.5–5.2)
Alkaline Phosphatase: 93 U/L (ref 39–117)
Bilirubin, Direct: 0.1 mg/dL (ref 0.0–0.3)
Indirect Bilirubin: 0.4 mg/dL (ref 0.2–1.2)
Total Bilirubin: 0.5 mg/dL (ref 0.2–1.2)
Total Protein: 6.9 g/dL (ref 6.0–8.3)

## 2013-06-20 LAB — INSULIN, FASTING: Insulin fasting, serum: 11 u[IU]/mL (ref 3–28)

## 2013-06-20 LAB — URINALYSIS, ROUTINE W REFLEX MICROSCOPIC
Bilirubin Urine: NEGATIVE
Glucose, UA: NEGATIVE mg/dL
Hgb urine dipstick: NEGATIVE
Ketones, ur: NEGATIVE mg/dL
Leukocytes, UA: NEGATIVE
Nitrite: NEGATIVE
Protein, ur: NEGATIVE mg/dL
Specific Gravity, Urine: 1.023 (ref 1.005–1.030)
Urobilinogen, UA: 0.2 mg/dL (ref 0.0–1.0)
pH: 6 (ref 5.0–8.0)

## 2013-06-20 LAB — LIPID PANEL
Cholesterol: 196 mg/dL (ref 0–200)
HDL: 48 mg/dL (ref >39)
LDL Cholesterol: 97 mg/dL (ref 0–99)
Total CHOL/HDL Ratio: 4.1 Ratio
Triglycerides: 255 mg/dL — ABNORMAL HIGH (ref <150)
VLDL: 51 mg/dL — ABNORMAL HIGH (ref 0–40)

## 2013-06-20 LAB — VITAMIN D 25 HYDROXY (VIT D DEFICIENCY, FRACTURES): Vit D, 25-Hydroxy: 53 ng/mL (ref 30–89)

## 2013-06-20 LAB — VITAMIN B12: Vitamin B-12: 301 pg/mL (ref 211–911)

## 2013-06-20 LAB — MICROALBUMIN / CREATININE URINE RATIO
Creatinine, Urine: 192.8 mg/dL
Microalb Creat Ratio: 5.9 mg/g (ref 0.0–30.0)
Microalb, Ur: 1.14 mg/dL (ref 0.00–1.89)

## 2013-06-20 LAB — TSH: TSH: 0.391 u[IU]/mL (ref 0.350–4.500)

## 2013-06-20 LAB — SJOGRENS SYNDROME-A EXTRACTABLE NUCLEAR ANTIBODY: SSA (Ro) (ENA) Antibody, IgG: <1

## 2013-06-20 LAB — MAGNESIUM: Magnesium: 2 mg/dL (ref 1.5–2.5)

## 2013-07-02 ENCOUNTER — Other Ambulatory Visit: Payer: Self-pay | Admitting: Physician Assistant

## 2013-07-10 ENCOUNTER — Encounter: Payer: Self-pay | Admitting: Internal Medicine

## 2013-08-19 ENCOUNTER — Other Ambulatory Visit: Payer: Self-pay | Admitting: Physician Assistant

## 2013-08-19 MED ORDER — AMPHETAMINE-DEXTROAMPHETAMINE 20 MG PO TABS: 20.0000 mg | ORAL_TABLET | Freq: Every day | ORAL | Status: DC

## 2013-09-04 ENCOUNTER — Ambulatory Visit (INDEPENDENT_AMBULATORY_CARE_PROVIDER_SITE_OTHER): Payer: BC Managed Care – PPO | Admitting: Physician Assistant

## 2013-09-04 ENCOUNTER — Encounter: Payer: Self-pay | Admitting: Physician Assistant

## 2013-09-04 VITALS — BP 120/62 | HR 68 | Temp 97.7°F | Resp 16 | Wt 164.0 lb

## 2013-09-04 DIAGNOSIS — H9202 Otalgia, left ear: Secondary | ICD-10-CM

## 2013-09-04 DIAGNOSIS — M7711 Lateral epicondylitis, right elbow: Secondary | ICD-10-CM

## 2013-09-04 DIAGNOSIS — M7710 Lateral epicondylitis, unspecified elbow: Secondary | ICD-10-CM

## 2013-09-04 DIAGNOSIS — H9209 Otalgia, unspecified ear: Secondary | ICD-10-CM

## 2013-09-04 DIAGNOSIS — R51 Headache: Secondary | ICD-10-CM

## 2013-09-04 LAB — CBC WITH DIFFERENTIAL/PLATELET
Basophils Absolute: 0.1 10*3/uL (ref 0.0–0.1)
Basophils Relative: 1 % (ref 0–1)
Eosinophils Absolute: 0.2 10*3/uL (ref 0.0–0.7)
Eosinophils Relative: 3 % (ref 0–5)
HCT: 45.2 % (ref 36.0–46.0)
Hemoglobin: 15.5 g/dL — ABNORMAL HIGH (ref 12.0–15.0)
Lymphocytes Relative: 36 % (ref 12–46)
Lymphs Abs: 3 10*3/uL (ref 0.7–4.0)
MCH: 29 pg (ref 26.0–34.0)
MCHC: 34.3 g/dL (ref 30.0–36.0)
MCV: 84.6 fL (ref 78.0–100.0)
Monocytes Absolute: 0.6 10*3/uL (ref 0.1–1.0)
Monocytes Relative: 7 % (ref 3–12)
Neutro Abs: 4.3 10*3/uL (ref 1.7–7.7)
Neutrophils Relative %: 53 % (ref 43–77)
Platelets: 335 10*3/uL (ref 150–400)
RBC: 5.34 MIL/uL — ABNORMAL HIGH (ref 3.87–5.11)
RDW: 14.1 % (ref 11.5–15.5)
WBC: 8.2 10*3/uL (ref 4.0–10.5)

## 2013-09-04 MED ORDER — TRAMADOL HCL 50 MG PO TABS: 50.0000 mg | ORAL_TABLET | Freq: Four times a day (QID) | ORAL | Status: DC | PRN

## 2013-09-04 MED ORDER — CARISOPRODOL 250 MG PO TABS: 250.0000 mg | ORAL_TABLET | Freq: Every day | ORAL | Status: DC

## 2013-09-04 NOTE — Progress Notes (Signed)
   Subjective:    Patient ID: Diana Wolfe, female    DOB: May 07, 1950, 63 y.o.   MRN: [id number]  HPI 63 y.o. female with sharp stabbing left ear pain with headache. She travels a lot for her job, started when she was driving down from [place]. Feels some fluid in that ear, has constant post nasal drip that is unchanged, + nausea without vomiting. Sharp/stabbing pain in her left ear and temple pain. She did forget to take her allergy med while traveling. Worse with chewing, has woken her up in the middle of the night. Denies teeth pain, broken teeth, changes vision, fever, chills, CP, SOB.   Able to walk several miles with her dog, does stairs with no CP, SOB, dizziness, nausea.    Review of Systems  Constitutional: Negative for fever, chills, diaphoresis, activity change, appetite change, fatigue and unexpected weight change.  HENT: Positive for ear pain and postnasal drip. Negative for congestion, dental problem, drooling, ear discharge, facial swelling, hearing loss, mouth sores, nosebleeds, rhinorrhea, sinus pressure, sneezing, sore throat, tinnitus, trouble swallowing and voice change.   Eyes: Positive for visual disturbance. Negative for photophobia, pain, discharge, redness and itching.  Respiratory: Negative.  Negative for cough and shortness of breath.   Cardiovascular: Negative for chest pain, palpitations and leg swelling.  Gastrointestinal: Negative.   Genitourinary: Negative.   Musculoskeletal: Negative.   Neurological: Positive for headaches. Negative for dizziness, tremors, seizures, syncope, facial asymmetry, speech difficulty, weakness, light-headedness and numbness.  Psychiatric/Behavioral: Negative.        Objective:   Physical Exam  Constitutional: She is oriented to person, place, and time. She appears well-developed and well-nourished.  HENT:  Head: Normocephalic and atraumatic.  Right Ear: External ear normal.  Left Ear: External ear normal.  Nose: Nose normal.   Mouth/Throat: Oropharynx is clear and moist.  Good dentition, no abscess, + right TMJ  Eyes: Conjunctivae and EOM are normal. Pupils are equal, round, and reactive to light.  Neck: Normal range of motion. Neck supple.  Cardiovascular: Normal rate, regular rhythm and normal heart sounds.   Pulmonary/Chest: Effort normal and breath sounds normal.  Abdominal: Soft. Bowel sounds are normal. There is no tenderness.  Genitourinary: No vaginal discharge found.  Musculoskeletal: Normal range of motion.  Lymphadenopathy:    She has no cervical adenopathy.  Neurological: She is alert and oriented to person, place, and time. She has normal reflexes. She displays normal reflexes. No cranial nerve deficit. She exhibits normal muscle tone. Coordination normal.  Skin: Skin is warm and dry. No rash noted.      Assessment & Plan:  Left ear pain/jaw pain, no CP/SOB- no signs of infection but will get CBC, get back on allergy pill and will do nasal spray, get ESR to rule out TA, normal neuro exam, no exertional symptoms and normal EKG - ? TMJ, given information/meds, continue heat.   Go to the ER if any CP, SOB, nausea, dizziness, severe HA, changes vision/speech

## 2013-09-04 NOTE — Patient Instructions (Signed)
Lateral Epicondylitis (Tennis Elbow) with Rehab Lateral epicondylitis involves inflammation and pain around the outer portion of the elbow. The pain is caused by inflammation of the tendons in the forearm that bring back (extend) the wrist. Lateral epicondylittis is also called tennis elbow, because it is very common in tennis players. However, it may occur in any individual who extends the wrist repetitively. If lateral epicondylitis is left untreated, it may become a chronic problem. SYMPTOMS   Pain, tenderness, and inflammation on the outer (lateral) side of the elbow.  Pain or weakness with gripping activities.  Pain that increases with wrist twisting motions (playing tennis, using a screwdriver, opening a door or a jar).  Pain with lifting objects, including a coffee cup. CAUSES  Lateral epicondylitis is caused by inflammation of the tendons that extend the wrist. Causes of injury may include:  Repetitive stress and strain on the muscles and tendons that extend the wrist.  Sudden change in activity level or intensity.  Incorrect grip in racquet sports.  Incorrect grip size of racquet (often too large).  Incorrect hitting position or technique (usually backhand, leading with the elbow).  Using a racket that is too heavy. RISK INCREASES WITH:  Sports or occupations that require repetitive and/or strenuous forearm and wrist movements (tennis, squash, racquetball, carpentry).  Poor wrist and forearm strength and flexibility.  Failure to warm up properly before activity.  Resuming activity before healing, rehabilitation, and conditioning are complete. PREVENTION   Warm up and stretch properly before activity.  Maintain physical fitness:  Strength, flexibility, and endurance.  Cardiovascular fitness.  Wear and use properly fitted equipment.  Learn and use proper technique and have a coach correct improper technique.  Wear a tennis elbow (counterforce) brace. PROGNOSIS   The course of this condition depends on the degree of the injury. If treated properly, acute cases (symptoms lasting less than 4 weeks) are often resolved in 2 to 6 weeks. Chronic (longer lasting cases) often resolve in 3 to 6 months, but may require physical therapy. RELATED COMPLICATIONS   Frequently recurring symptoms, resulting in a chronic problem. Properly treating the problem the first time decreases frequency of recurrence.  Chronic inflammation, scarring tendon degeneration, and partial tendon tear, requiring surgery.  Delayed healing or resolution of symptoms. TREATMENT  Treatment first involves the use of ice and medicine, to reduce pain and inflammation. Strengthening and stretching exercises may help reduce discomfort, if performed regularly. These exercises may be performed at home, if the condition is an acute injury. Chronic cases may require a referral to a physical therapist for evaluation and treatment. Your caregiver may advise a corticosteroid injection, to help reduce inflammation. Rarely, surgery is needed. MEDICATION  If pain medicine is needed, nonsteroidal anti-inflammatory medicines (aspirin and ibuprofen), or other minor pain relievers (acetaminophen), are often advised.  Do not take pain medicine for 7 days before surgery.  Prescription pain relievers may be given, if your caregiver thinks they are needed. Use only as directed and only as much as you need.  Corticosteroid injections may be recommended. These injections should be reserved only for the most severe cases, because they can only be given a certain number of times. HEAT AND COLD  Cold treatment (icing) should be applied for 10 to 15 minutes every 2 to 3 hours for inflammation and pain, and immediately after activity that aggravates your symptoms. Use ice packs or an ice massage.  Heat treatment may be used before performing stretching and strengthening activities prescribed by your   caregiver, physical  therapist, or athletic trainer. Use a heat pack or a warm water soak. SEEK MEDICAL CARE IF: Symptoms get worse or do not improve in 2 weeks, despite treatment. EXERCISES  RANGE OF MOTION (ROM) AND STRETCHING EXERCISES - Epicondylitis, Lateral (Tennis Elbow) These exercises may help you when beginning to rehabilitate your injury. Your symptoms may go away with or without further involvement from your physician, physical therapist or athletic trainer. While completing these exercises, remember:   Restoring tissue flexibility helps normal motion to return to the joints. This allows healthier, less painful movement and activity.  An effective stretch should be held for at least 30 seconds.  A stretch should never be painful. You should only feel a gentle lengthening or release in the stretched tissue. RANGE OF MOTION - Wrist Flexion, Active-Assisted  Extend your right / left elbow with your fingers pointing down.*  Gently pull the back of your hand towards you, until you feel a gentle stretch on the top of your forearm.  Hold this position for __________ seconds. Repeat __________ times. Complete this exercise __________ times per day.  *If directed by your physician, physical therapist or athletic trainer, complete this stretch with your elbow bent, rather than extended. RANGE OF MOTION - Wrist Extension, Active-Assisted  Extend your right / left elbow and turn your palm upwards.*  Gently pull your palm and fingertips back, so your wrist extends and your fingers point more toward the ground.  You should feel a gentle stretch on the inside of your forearm.  Hold this position for __________ seconds. Repeat __________ times. Complete this exercise __________ times per day. *If directed by your physician, physical therapist or athletic trainer, complete this stretch with your elbow bent, rather than extended. STRETCH - Wrist Flexion  Place the back of your right / left hand on a tabletop,  leaving your elbow slightly bent. Your fingers should point away from your body.  Gently press the back of your hand down onto the table by straightening your elbow. You should feel a stretch on the top of your forearm.  Hold this position for __________ seconds. Repeat __________ times. Complete this stretch __________ times per day.  STRETCH - Wrist Extension   Place your right / left fingertips on a tabletop, leaving your elbow slightly bent. Your fingers should point backwards.  Gently press your fingers and palm down onto the table by straightening your elbow. You should feel a stretch on the inside of your forearm.  Hold this position for __________ seconds. Repeat __________ times. Complete this stretch __________ times per day.  STRENGTHENING EXERCISES - Epicondylitis, Lateral (Tennis Elbow) These exercises may help you when beginning to rehabilitate your injury. They may resolve your symptoms with or without further involvement from your physician, physical therapist or athletic trainer. While completing these exercises, remember:   Muscles can gain both the endurance and the strength needed for everyday activities through controlled exercises.  Complete these exercises as instructed by your physician, physical therapist or athletic trainer. Increase the resistance and repetitions only as guided.  You may experience muscle soreness or fatigue, but the pain or discomfort you are trying to eliminate should never worsen during these exercises. If this pain does get worse, stop and make sure you are following the directions exactly. If the pain is still present after adjustments, discontinue the exercise until you can discuss the trouble with your caregiver. STRENGTH - Wrist Flexors  Sit with your right / left forearm palm-up   and fully supported on a table or countertop. Your elbow should be resting below the height of your shoulder. Allow your wrist to extend over the edge of the  surface.  Loosely holding a __________ weight, or a piece of rubber exercise band or tubing, slowly curl your hand up toward your forearm.  Hold this position for __________ seconds. Slowly lower the wrist back to the starting position in a controlled manner. Repeat __________ times. Complete this exercise __________ times per day.  STRENGTH - Wrist Extensors  Sit with your right / left forearm palm-down and fully supported on a table or countertop. Your elbow should be resting below the height of your shoulder. Allow your wrist to extend over the edge of the surface.  Loosely holding a __________ weight, or a piece of rubber exercise band or tubing, slowly curl your hand up toward your forearm.  Hold this position for __________ seconds. Slowly lower the wrist back to the starting position in a controlled manner. Repeat __________ times. Complete this exercise __________ times per day.  STRENGTH - Ulnar Deviators  Stand with a ____________________ weight in your right / left hand, or sit while holding a rubber exercise band or tubing, with your healthy arm supported on a table or countertop.  Move your wrist, so that your pinkie travels toward your forearm and your thumb moves away from your forearm.  Hold this position for __________ seconds and then slowly lower the wrist back to the starting position. Repeat __________ times. Complete this exercise __________ times per day STRENGTH - Radial Deviators  Stand with a ____________________ weight in your right / left hand, or sit while holding a rubber exercise band or tubing, with your injured arm supported on a table or countertop.  Raise your hand upward in front of you or pull up on the rubber tubing.  Hold this position for __________ seconds and then slowly lower the wrist back to the starting position. Repeat __________ times. Complete this exercise __________ times per day. STRENGTH - Forearm Supinators   Sit with your right /  left forearm supported on a table, keeping your elbow below shoulder height. Rest your hand over the edge, palm down.  Gently grip a hammer or a soup ladle.  Without moving your elbow, slowly turn your palm and hand upward to a "thumbs-up" position.  Hold this position for __________ seconds. Slowly return to the starting position. Repeat __________ times. Complete this exercise __________ times per day.  STRENGTH - Forearm Pronators   Sit with your right / left forearm supported on a table, keeping your elbow below shoulder height. Rest your hand over the edge, palm up.  Gently grip a hammer or a soup ladle.  Without moving your elbow, slowly turn your palm and hand upward to a "thumbs-up" position.  Hold this position for __________ seconds. Slowly return to the starting position. Repeat __________ times. Complete this exercise __________ times per day.  STRENGTH - Grip  Grasp a tennis ball, a dense sponge, or a large, rolled sock in your hand.  Squeeze as hard as you can, without increasing any pain.  Hold this position for __________ seconds. Release your grip slowly. Repeat __________ times. Complete this exercise __________ times per day.  STRENGTH - Elbow Extensors, Isometric  Stand or sit upright, on a firm surface. Place your right / left arm so that your palm faces your stomach, and it is at the height of your waist.  Place your opposite hand  on the underside of your forearm. Gently push up as your right / left arm resists. Push as hard as you can with both arms, without causing any pain or movement at your right / left elbow. Hold this stationary position for __________ seconds. Gradually release the tension in both arms. Allow your muscles to relax completely before repeating. Document Released: 02/07/2005 Document Revised: 05/02/2011 Document Reviewed: 05/22/2008 [facility] Patient Information 2015 [employer] [place]. This information is not intended to replace advice given  to you by your health care provider. Make sure you discuss any questions you have with your health care provider.   What is the TMJ? The temporomandibular (tem-PUH-ro-man-DIB-yoo-ler) joint, or the TMJ, connects the upper and lower jawbones. This joint allows the jaw to open wide and move back and forth when you chew, talk, or yawn.There are also several muscles that help this joint move. There can be muscle tightness and pain in the muscle that can cause several symptoms.  What causes TMJ pain? There are many causes of TMJ pain. Repeated chewing (for example, chewing gum) and clenching your teeth can cause pain in the joint. Some TMJ pain has no obvious cause. What can I do to ease the pain? There are many things you can do to help your pain get better. When you have pain:  Eat soft foods and stay away from chewy foods (for example, taffy) Try to use both sides of your mouth to chew Don't chew gum Don't open your mouth wide (for example, during yawning or singing) Don't bite your cheeks or fingernails Lower your amount of stress and worry Applying a warm, damp washcloth to the joint may help. Over-the-counter pain medicines such as ibuprofen (one brand: Advil) or acetaminophen (one brand: Tylenol) might also help. Do not use these medicines if you are allergic to them or if your doctor told you not to use them. How can I stop the pain from coming back? When your pain is better, you can do these exercises to make your muscles stronger and to keep the pain from coming back:  Resisted mouth opening: Place your thumb or two fingers under your chin and open your mouth slowly, pushing up lightly on your chin with your thumb. Hold for three to six seconds. Close your mouth slowly. Resisted mouth closing: Place your thumbs under your chin and your two index fingers on the ridge between your mouth and the bottom of your chin. Push down lightly on your chin as you close your mouth. Tongue up: Slowly  open and close your mouth while keeping the tongue touching the roof of the mouth. Side-to-side jaw movement: Place an object about one fourth of an inch thick (for example, two tongue depressors) between your front teeth. Slowly move your jaw from side to side. Increase the thickness of the object as the exercise becomes easier Forward jaw movement: Place an object about one fourth of an inch thick between your front teeth and move the bottom jaw forward so that the bottom teeth are in front of the top teeth. Increase the thickness of the object as the exercise becomes easier. These exercises should not be painful. If it hurts to do these exercises, stop doing them and talk to your family doctor.

## 2013-09-05 LAB — SEDIMENTATION RATE: Sed Rate: 1 mm/hr (ref 0–22)

## 2013-10-15 ENCOUNTER — Other Ambulatory Visit: Payer: Self-pay | Admitting: Physician Assistant

## 2013-10-15 MED ORDER — ESTRADIOL 10 MCG VA TABS: 10.0000 ug | ORAL_TABLET | VAGINAL | Status: DC

## 2013-10-30 ENCOUNTER — Other Ambulatory Visit: Payer: Self-pay | Admitting: Physician Assistant

## 2013-10-30 MED ORDER — AMPHETAMINE-DEXTROAMPHETAMINE 20 MG PO TABS: 20.0000 mg | ORAL_TABLET | Freq: Every day | ORAL | Status: DC

## 2013-11-22 ENCOUNTER — Other Ambulatory Visit: Payer: Self-pay | Admitting: Physician Assistant

## 2013-12-03 ENCOUNTER — Other Ambulatory Visit: Payer: Self-pay | Admitting: Internal Medicine

## 2013-12-03 DIAGNOSIS — Z1231 Encounter for screening mammogram for malignant neoplasm of breast: Secondary | ICD-10-CM

## 2013-12-12 ENCOUNTER — Ambulatory Visit (HOSPITAL_COMMUNITY)
Admission: RE | Admit: 2013-12-12 | Discharge: 2013-12-12 | Disposition: A | Discharge status: Home / Self Care | Payer: BC Managed Care – PPO | Source: Ambulatory Visit | Attending: Internal Medicine | Admitting: Internal Medicine

## 2013-12-12 DIAGNOSIS — Z1231 Encounter for screening mammogram for malignant neoplasm of breast: Secondary | ICD-10-CM | POA: Insufficient documentation

## 2014-01-08 ENCOUNTER — Other Ambulatory Visit: Payer: Self-pay | Admitting: Internal Medicine

## 2014-01-09 MED ORDER — AMPHETAMINE-DEXTROAMPHETAMINE 20 MG PO TABS: ORAL_TABLET | ORAL | Status: DC

## 2014-01-23 ENCOUNTER — Other Ambulatory Visit: Payer: Self-pay

## 2014-01-23 MED ORDER — LEVOTHYROXINE SODIUM 125 MCG PO TABS: 125.0000 ug | ORAL_TABLET | Freq: Every day | ORAL | Status: DC

## 2014-03-26 ENCOUNTER — Other Ambulatory Visit: Payer: Self-pay | Admitting: Physician Assistant

## 2014-03-27 ENCOUNTER — Encounter: Payer: Self-pay | Admitting: Physician Assistant

## 2014-03-27 ENCOUNTER — Ambulatory Visit (INDEPENDENT_AMBULATORY_CARE_PROVIDER_SITE_OTHER): Payer: BLUE CROSS/BLUE SHIELD | Admitting: Physician Assistant

## 2014-03-27 VITALS — BP 130/72 | HR 60 | Temp 97.7°F | Resp 16 | Ht 62.5 in | Wt 162.0 lb

## 2014-03-27 DIAGNOSIS — I10 Essential (primary) hypertension: Secondary | ICD-10-CM

## 2014-03-27 DIAGNOSIS — N952 Postmenopausal atrophic vaginitis: Secondary | ICD-10-CM | POA: Insufficient documentation

## 2014-03-27 DIAGNOSIS — E559 Vitamin D deficiency, unspecified: Secondary | ICD-10-CM

## 2014-03-27 DIAGNOSIS — E785 Hyperlipidemia, unspecified: Secondary | ICD-10-CM

## 2014-03-27 DIAGNOSIS — G4733 Obstructive sleep apnea (adult) (pediatric): Secondary | ICD-10-CM

## 2014-03-27 DIAGNOSIS — E039 Hypothyroidism, unspecified: Secondary | ICD-10-CM

## 2014-03-27 DIAGNOSIS — F329 Major depressive disorder, single episode, unspecified: Secondary | ICD-10-CM

## 2014-03-27 DIAGNOSIS — R7309 Other abnormal glucose: Secondary | ICD-10-CM | POA: Insufficient documentation

## 2014-03-27 DIAGNOSIS — R7303 Prediabetes: Secondary | ICD-10-CM

## 2014-03-27 DIAGNOSIS — F909 Attention-deficit hyperactivity disorder, unspecified type: Secondary | ICD-10-CM

## 2014-03-27 DIAGNOSIS — F32A Depression, unspecified: Secondary | ICD-10-CM

## 2014-03-27 DIAGNOSIS — F988 Other specified behavioral and emotional disorders with onset usually occurring in childhood and adolescence: Secondary | ICD-10-CM

## 2014-03-27 LAB — CBC WITH DIFFERENTIAL/PLATELET
Basophils Absolute: 0.1 10*3/uL (ref 0.0–0.1)
Basophils Relative: 1 % (ref 0–1)
Eosinophils Absolute: 0.1 10*3/uL (ref 0.0–0.7)
Eosinophils Relative: 2 % (ref 0–5)
HCT: 46.4 % — ABNORMAL HIGH (ref 36.0–46.0)
Hemoglobin: 15.5 g/dL — ABNORMAL HIGH (ref 12.0–15.0)
Lymphocytes Relative: 28 % (ref 12–46)
Lymphs Abs: 2.1 10*3/uL (ref 0.7–4.0)
MCH: 29.1 pg (ref 26.0–34.0)
MCHC: 33.4 g/dL (ref 30.0–36.0)
MCV: 87.2 fL (ref 78.0–100.0)
MPV: 9.7 fL (ref 8.6–12.4)
Monocytes Absolute: 0.4 10*3/uL (ref 0.1–1.0)
Monocytes Relative: 6 % (ref 3–12)
Neutro Abs: 4.7 10*3/uL (ref 1.7–7.7)
Neutrophils Relative %: 63 % (ref 43–77)
Platelets: 353 10*3/uL (ref 150–400)
RBC: 5.32 MIL/uL — ABNORMAL HIGH (ref 3.87–5.11)
RDW: 14 % (ref 11.5–15.5)
WBC: 7.4 10*3/uL (ref 4.0–10.5)

## 2014-03-27 LAB — LIPID PANEL
Cholesterol: 196 mg/dL (ref 0–200)
HDL: 45 mg/dL (ref >39)
LDL Cholesterol: 98 mg/dL (ref 0–99)
Total CHOL/HDL Ratio: 4.4 Ratio
Triglycerides: 263 mg/dL — ABNORMAL HIGH (ref <150)
VLDL: 53 mg/dL — ABNORMAL HIGH (ref 0–40)

## 2014-03-27 LAB — BASIC METABOLIC PANEL WITH GFR
BUN: 10 mg/dL (ref 6–23)
CO2: 28 mEq/L (ref 19–32)
Calcium: 10.1 mg/dL (ref 8.4–10.5)
Chloride: 105 mEq/L (ref 96–112)
Creat: 0.84 mg/dL (ref 0.50–1.10)
GFR, Est African American: 87 mL/min
GFR, Est Non African American: 75 mL/min
Glucose, Bld: 88 mg/dL (ref 70–99)
Potassium: 4.2 mEq/L (ref 3.5–5.3)
Sodium: 141 mEq/L (ref 135–145)

## 2014-03-27 LAB — HEPATIC FUNCTION PANEL
ALT: 21 U/L (ref 0–35)
AST: 18 U/L (ref 0–37)
Albumin: 4.1 g/dL (ref 3.5–5.2)
Alkaline Phosphatase: 88 U/L (ref 39–117)
Bilirubin, Direct: 0.1 mg/dL (ref 0.0–0.3)
Indirect Bilirubin: 0.4 mg/dL (ref 0.2–1.2)
Total Bilirubin: 0.5 mg/dL (ref 0.2–1.2)
Total Protein: 6.6 g/dL (ref 6.0–8.3)

## 2014-03-27 LAB — HEMOGLOBIN A1C
Hgb A1c MFr Bld: 5.8 % — ABNORMAL HIGH (ref <5.7)
Mean Plasma Glucose: 120 mg/dL — ABNORMAL HIGH (ref <117)

## 2014-03-27 MED ORDER — AMPHETAMINE-DEXTROAMPHETAMINE 20 MG PO TABS: ORAL_TABLET | ORAL | Status: DC

## 2014-03-27 MED ORDER — OSPEMIFENE 60 MG PO TABS: ORAL_TABLET | ORAL | Status: DC

## 2014-03-27 MED ORDER — MELOXICAM 15 MG PO TABS: 15.0000 mg | ORAL_TABLET | Freq: Every day | ORAL | Status: DC

## 2014-03-27 NOTE — Progress Notes (Signed)
Assessment and Plan:  Hypertension: Continue medication, monitor blood pressure at home. Continue DASH diet.  Reminder to go to the ER if any CP, SOB, nausea, dizziness, severe HA, changes vision/speech, left arm numbness and tingling, and jaw pain. Cholesterol: Continue diet and exercise. Check cholesterol.  Pre-diabetes-Continue diet and exercise. Check A1C Vitamin D Def- check level and continue medications.  ADD-  Continue ADD medication, helps with focus, no AE's. The patient was counseled on the addictive nature of the medication and was encouraged to take drug holidays when not needed.  Vaginal atrophy- vagifem is very expensive- would like to try to osphena- will give samples/card  Continue diet and meds as discussed. Further disposition pending results of labs.  HPI 64 y.o. female  presents for overdue 3 month follow up with hypertension, hyperlipidemia, prediabetes and vitamin D.  Her blood pressure has been controlled at home, today their BP is BP: 130/72 mmHg  She does not workout but walks a lot for her job and still having foot pain. She denies chest pain, shortness of breath, dizziness.  She is not on cholesterol medication and denies myalgias. Her cholesterol is at goal. The cholesterol last visit was:   Lab Results  Component Value Date   CHOL 196 06/19/2013   HDL 48 06/19/2013   LDLCALC 97 06/19/2013   LDLDIRECT 138.2 04/25/2008   TRIG 255* 06/19/2013   CHOLHDL 4.1 06/19/2013    She has been working on diet and exercise for prediabetes, and denies paresthesia of the feet, polydipsia, polyuria and visual disturbances. Last A1C in the office was:  Lab Results  Component Value Date   HGBA1C 5.8* 06/19/2013  Patient is on Vitamin D supplement.   Lab Results  Component Value Date   VD25OH 47 06/19/2013  She is on the adderall, 1/2 tablet 1-2 x a day mainly when she drives.    She is on thyroid medication. Her medication was not changed last visit.   Lab Results   Component Value Date   TSH 0.391 06/19/2013  .   Current Medications:  Current Outpatient Prescriptions on File Prior to Visit  Medication Sig Dispense Refill  . atenolol (TENORMIN) 50 MG tablet TAKE 1/2 TO 1 TABLET BY MOUTH EVERY NIGHT AT BEDTIME 90 tablet 0  . carisoprodol (SOMA) 250 MG tablet Take 1 tablet (250 mg total) by mouth at bedtime. 30 tablet 0  . Estradiol (VAGIFEM) 10 MCG TABS vaginal tablet Place 1 tablet (10 mcg total) vaginally as directed. 8 tablet 3  . fluconazole (DIFLUCAN) 150 MG tablet Take 1 tablet (150 mg total) by mouth once. 1 tablet 3  . gabapentin (NEURONTIN) 300 MG capsule 1-2 pills at night, can take one during the day 90 capsule 0  . levothyroxine (SYNTHROID, LEVOTHROID) 125 MCG tablet Take 1 tablet (125 mcg total) by mouth daily. as directed 30 tablet 3  . lidocaine (LIDODERM) 5 % Place 1 patch onto the skin daily. Remove & Discard patch within 12 hours or as directed by MD 30 patch 3  . meloxicam (MOBIC) 15 MG tablet Take 1 tablet (15 mg total) by mouth daily. 90 tablet 1  . traMADol (ULTRAM) 50 MG tablet Take 1 tablet (50 mg total) by mouth every 6 (six) hours as needed. 90 tablet 0  . amphetamine-dextroamphetamine (ADDERALL) 20 MG tablet Take 1/2 to 1 tablet if needed for ADD - Need Office before Next Refill 30 tablet 0   No current facility-administered medications on file prior to visit.  Medical History:  Past Medical History  Diagnosis Date  . Hyperlipidemia   . Hypertension   . Anxiety   . GERD (gastroesophageal reflux disease)   . ADD (attention deficit disorder)   . Depression   . Vitamin D deficiency   . Hypothyroid    Allergies:  Allergies  Allergen Reactions  . Cefprozil   . Codeine     REACTION: vomiting  . Hydrocodone-Acetaminophen   . Levofloxacin   . Morphine   . Pravastatin Swelling     Review of Systems:  Review of Systems  Constitutional: Negative.   Eyes: Negative.   Respiratory: Negative.   Cardiovascular:  Negative.   Gastrointestinal: Negative.   Genitourinary: Negative.        + vaginal atrophy, normal PAP 2 years ago  Musculoskeletal: Positive for myalgias. Negative for back pain, joint pain, falls and neck pain.  Skin: Negative.   Neurological: Negative.   Endo/Heme/Allergies: Negative.   Psychiatric/Behavioral: Negative.     Family history- Review and unchanged Social history- Review and unchanged Physical Exam: BP 130/72 mmHg  Pulse 60  Temp(Src) 97.7 F (36.5 C)  Resp 16  Ht 5' 2.5" (1.588 m)  Wt 162 lb (73.483 kg)  BMI 29.14 kg/m2 Wt Readings from Last 3 Encounters:  03/27/14 162 lb (73.483 kg)  09/04/13 164 lb (74.39 kg)  06/19/13 159 lb 9.6 oz (72.394 kg)   General Appearance: Well nourished, in no apparent distress. Eyes: PERRLA, EOMs, conjunctiva no swelling or erythema Sinuses: No Frontal/maxillary tenderness ENT/Mouth: Ext aud canals clear, TMs without erythema, bulging. No erythema, swelling, or exudate on post pharynx.  Tonsils not swollen or erythematous. Hearing normal.  Neck: Supple, thyroid normal.  Respiratory: Respiratory effort normal, BS equal bilaterally without rales, rhonchi, wheezing or stridor.  Cardio: RRR with no MRGs. Brisk peripheral pulses without edema.  Abdomen: Soft, + BS,  Non tender, no guarding, rebound, hernias, masses. Lymphatics: Non tender without lymphadenopathy.  Musculoskeletal: Full ROM, 5/5 strength, Normal Skin: Warm, dry without rashes, lesions, ecchymosis.  Neuro: Cranial nerves intact. Normal muscle tone, no cerebellar symptoms. Psych: Awake and oriented X 3, normal affect, Insight and Judgment appropriate.    Quentin Mullingollier, Kenyetta Fife, PA-C 9:46 AM [facility]

## 2014-03-27 NOTE — Patient Instructions (Addendum)
Can try osphena- do one pill daily for 2-4 weeks and then can do every other day Add ENTERIC COATED low dose 81 mg Aspirin daily OR can do every other day if you have easy bruising to protect your heart and head.   Benefiber is good for constipation/diarrhea/irritable bowel syndrome, it helps with weight loss and can help lower your bad cholesterol. Please do 1-2 TBSP in the morning in water, coffee, or tea. It can take up to a month before you can see a difference with your bowel movements. It is cheapest from [employer], [employer], [employer].    VAGINAL DRYNESS OVERVIEW  Vaginal dryness, also known as atrophic vaginitis, is a common condition in postmenopausal women. This condition is also common in women who have had both ovaries removed at the time of hysterectomy.   Some women have uncomfortable symptoms of vaginal dryness, such as pain with sex, burning vaginal discomfort or itching, or abnormal vaginal discharge, while others have no symptoms at all.  VAGINAL DRYNESS CAUSES   Estrogen helps to keep the vagina moist and to maintain thickness of the vaginal lining. Vaginal dryness occurs when the ovaries produce a decreased amount of estrogen. This can occur at certain times in a woman's life, and may be permanent or temporary. Times when less estrogen is made include: ?At the time of menopause. ?After surgical removal of the ovaries, chemotherapy, or radiation therapy of the pelvis for cancer. ?After having a baby, particularly in women who breastfeed. ?While using certain medications, such as danazol, medroxyprogesterone (brand names: Provera or DepoProvera), leuprolide (brand name: Lupron), or nafarelin. When these medications are stopped, estrogen production resumes.  Women who smoke cigarettes have been shown to have an increased risk of an earlier menopause transition as compared to non-smokers. Therefore, atrophic vaginitis symptoms may appear at a younger age in this population.  VAGINAL  DRYNESS TREATMENT   There are three treatment options for women with vaginal dryness:  Vaginal lubricants and moisturizers - Vaginal lubricants and moisturizers can be purchased without a prescription. These products do not contain any hormones and have virtually no side effects. - Albolene is found in the facial cleanser section at [facility], [facility], or [employer]. It is a large jar with a blue top. This is the best lubricant for women because it is hypoallergenic. -Natural lubricants, such as olive, avocado or peanut oil, are easily available products that may be used as a lubricant with sex.  -Vaginal moisturizes (eg, Replens, Moist Again, Vagisil, K-Y Silk-E, and Feminease) are formulated to allow water to be retained in the vaginal tissues. Moisturizers are applied into the vagina three times weekly to allow a continued moisturizing effect. These should not be used just before having sex, as they can be irritating.  Vaginal estrogen - Vaginal estrogen is the most effective treatment option for women with vaginal dryness. Vaginal estrogen must be prescribed by a healthcare provider. Very low doses of vaginal estrogen can be used when it is put into the vagina to treat vaginal dryness. A small amount of estrogen is absorbed into the bloodstream, but only about 100 times less than when using estrogen pills or tablets. As a result, there is a much lower risk of side effects, such as blood clots, breast cancer, and heart attack, compared with other estrogen-containing products (birth control pills, menopausal hormone therapy).   Ospemifene - Ospemifene is a prescription medication that is similar to estrogen, but is not estrogen. In the vaginal tissue, it acts similarly to estrogen.  In the breast tissue, it acts as an estrogen blocker. It comes in a pill, and is prescribed for women who want to use an estrogen-like medication for vaginal dryness or painful sex associated with vaginal dryness, but prefer not to  use a vaginal medication. The medication may cause hot flashes as a side effect. This type of medication may increase the risk of blood clots or uterine cancer. Further study of ospemifene is needed to evaluate the risk of these complications. This medication has not been tested in women who have had breast cancer or are at a high risk of developing breast cancer.    Sexual activity - Vaginal estrogen improves vaginal dryness quickly, usually within a few weeks. You may continue to have sex as you treat vaginal dryness because sex itself can help to keep the vaginal tissues healthy. Vaginal intercourse may help the vaginal tissues by keeping them soft and stretchable and preventing the tissues from shrinking.  If sex continues to be painful despite treatment for vaginal dryness, talk to your healthcare provider.

## 2014-03-28 LAB — TSH: TSH: 1.002 u[IU]/mL (ref 0.350–4.500)

## 2014-04-07 ENCOUNTER — Encounter: Payer: Self-pay | Admitting: Physician Assistant

## 2014-04-07 ENCOUNTER — Other Ambulatory Visit: Payer: Self-pay | Admitting: Physician Assistant

## 2014-04-07 MED ORDER — ALPRAZOLAM 1 MG PO TABS: ORAL_TABLET | ORAL | Status: DC

## 2014-04-08 ENCOUNTER — Other Ambulatory Visit: Payer: Self-pay | Admitting: Physician Assistant

## 2014-04-08 MED ORDER — ESTROGENS, CONJUGATED 0.625 MG/GM VA CREA: 1.0000 | TOPICAL_CREAM | Freq: Every day | VAGINAL | Status: DC

## 2014-04-09 ENCOUNTER — Other Ambulatory Visit: Payer: Self-pay

## 2014-04-09 ENCOUNTER — Telehealth: Payer: Self-pay

## 2014-04-09 MED ORDER — ESTROGENS, CONJUGATED 0.625 MG/GM VA CREA: 1.0000 | TOPICAL_CREAM | Freq: Every day | VAGINAL | Status: DC

## 2014-04-09 NOTE — Telephone Encounter (Signed)
Advised patient that the Arna SnipeOsphena is denied per [employer] , she has tried Vagifem and is now trying the  Premarin cream, advised patient to let us know how this works and if not successful we can attempt to retry a precert for the Osphena.

## 2014-05-15 ENCOUNTER — Encounter: Payer: Self-pay | Admitting: Physician Assistant

## 2014-05-15 ENCOUNTER — Other Ambulatory Visit: Payer: Self-pay | Admitting: Physician Assistant

## 2014-05-15 MED ORDER — AMPHETAMINE-DEXTROAMPHETAMINE 20 MG PO TABS: ORAL_TABLET | ORAL | Status: DC

## 2014-06-23 ENCOUNTER — Encounter: Payer: Self-pay | Admitting: Physician Assistant

## 2014-06-23 ENCOUNTER — Ambulatory Visit (INDEPENDENT_AMBULATORY_CARE_PROVIDER_SITE_OTHER): Payer: BLUE CROSS/BLUE SHIELD | Admitting: Physician Assistant

## 2014-06-23 VITALS — BP 120/62 | HR 60 | Temp 97.7°F | Resp 16 | Ht 62.5 in | Wt 160.0 lb

## 2014-06-23 DIAGNOSIS — Z79899 Other long term (current) drug therapy: Secondary | ICD-10-CM | POA: Insufficient documentation

## 2014-06-23 DIAGNOSIS — F419 Anxiety disorder, unspecified: Secondary | ICD-10-CM

## 2014-06-23 DIAGNOSIS — F988 Other specified behavioral and emotional disorders with onset usually occurring in childhood and adolescence: Secondary | ICD-10-CM

## 2014-06-23 DIAGNOSIS — K219 Gastro-esophageal reflux disease without esophagitis: Secondary | ICD-10-CM

## 2014-06-23 DIAGNOSIS — E039 Hypothyroidism, unspecified: Secondary | ICD-10-CM

## 2014-06-23 DIAGNOSIS — F32A Depression, unspecified: Secondary | ICD-10-CM

## 2014-06-23 DIAGNOSIS — R519 Headache, unspecified: Secondary | ICD-10-CM

## 2014-06-23 DIAGNOSIS — I10 Essential (primary) hypertension: Secondary | ICD-10-CM

## 2014-06-23 DIAGNOSIS — B354 Tinea corporis: Secondary | ICD-10-CM

## 2014-06-23 DIAGNOSIS — Z0001 Encounter for general adult medical examination with abnormal findings: Secondary | ICD-10-CM

## 2014-06-23 DIAGNOSIS — E559 Vitamin D deficiency, unspecified: Secondary | ICD-10-CM

## 2014-06-23 DIAGNOSIS — N952 Postmenopausal atrophic vaginitis: Secondary | ICD-10-CM

## 2014-06-23 DIAGNOSIS — F909 Attention-deficit hyperactivity disorder, unspecified type: Secondary | ICD-10-CM

## 2014-06-23 DIAGNOSIS — F329 Major depressive disorder, single episode, unspecified: Secondary | ICD-10-CM

## 2014-06-23 DIAGNOSIS — R6889 Other general symptoms and signs: Secondary | ICD-10-CM

## 2014-06-23 DIAGNOSIS — R51 Headache: Secondary | ICD-10-CM

## 2014-06-23 DIAGNOSIS — E785 Hyperlipidemia, unspecified: Secondary | ICD-10-CM

## 2014-06-23 DIAGNOSIS — J309 Allergic rhinitis, unspecified: Secondary | ICD-10-CM

## 2014-06-23 DIAGNOSIS — D509 Iron deficiency anemia, unspecified: Secondary | ICD-10-CM

## 2014-06-23 DIAGNOSIS — R5383 Other fatigue: Secondary | ICD-10-CM

## 2014-06-23 DIAGNOSIS — Z86718 Personal history of other venous thrombosis and embolism: Secondary | ICD-10-CM

## 2014-06-23 DIAGNOSIS — N3281 Overactive bladder: Secondary | ICD-10-CM

## 2014-06-23 DIAGNOSIS — G4733 Obstructive sleep apnea (adult) (pediatric): Secondary | ICD-10-CM

## 2014-06-23 DIAGNOSIS — R7303 Prediabetes: Secondary | ICD-10-CM

## 2014-06-23 LAB — CBC WITH DIFFERENTIAL/PLATELET
Basophils Absolute: 0.1 10*3/uL (ref 0.0–0.1)
Basophils Relative: 1 % (ref 0–1)
Eosinophils Absolute: 0.2 10*3/uL (ref 0.0–0.7)
Eosinophils Relative: 2 % (ref 0–5)
HCT: 47.3 % — ABNORMAL HIGH (ref 36.0–46.0)
Hemoglobin: 15.9 g/dL — ABNORMAL HIGH (ref 12.0–15.0)
Lymphocytes Relative: 29 % (ref 12–46)
Lymphs Abs: 2.3 10*3/uL (ref 0.7–4.0)
MCH: 29.4 pg (ref 26.0–34.0)
MCHC: 33.6 g/dL (ref 30.0–36.0)
MCV: 87.6 fL (ref 78.0–100.0)
MPV: 9.9 fL (ref 8.6–12.4)
Monocytes Absolute: 0.6 10*3/uL (ref 0.1–1.0)
Monocytes Relative: 8 % (ref 3–12)
Neutro Abs: 4.7 10*3/uL (ref 1.7–7.7)
Neutrophils Relative %: 60 % (ref 43–77)
Platelets: 334 10*3/uL (ref 150–400)
RBC: 5.4 MIL/uL — ABNORMAL HIGH (ref 3.87–5.11)
RDW: 14 % (ref 11.5–15.5)
WBC: 7.9 10*3/uL (ref 4.0–10.5)

## 2014-06-23 LAB — HEMOGLOBIN A1C
Hgb A1c MFr Bld: 5.6 % (ref <5.7)
Mean Plasma Glucose: 114 mg/dL (ref <117)

## 2014-06-23 MED ORDER — AMPHETAMINE-DEXTROAMPHETAMINE 20 MG PO TABS: ORAL_TABLET | ORAL | Status: DC

## 2014-06-23 MED ORDER — CLOTRIMAZOLE-BETAMETHASONE 1-0.05 % EX CREA: 1.0000 "application " | TOPICAL_CREAM | Freq: Two times a day (BID) | CUTANEOUS | Status: DC

## 2014-06-23 MED ORDER — RANITIDINE HCL 300 MG PO TABS: ORAL_TABLET | ORAL | Status: DC

## 2014-06-23 NOTE — Progress Notes (Signed)
Complete Physical  Assessment and Plan: 1. Essential hypertension - continue medications, DASH diet, exercise and monitor at home. Call if greater than 130/80.  Cut atenolol in half due to fatigue/sinus brady, monitor BP/HR, Go to the ER if any CP, SOB, nausea, dizziness, severe HA, changes vision/speech - CBC with Differential/Platelet - BASIC METABOLIC PANEL WITH GFR - Hepatic function panel - Urinalysis, Routine w reflex microscopic - Microalbumin / creatinine urine ratio - EKG 12-Lead - [place], RETROPERITNL ABD,  LTD  2. Prediabetes Discussed general issues about diabetes pathophysiology and management., Educational material distributed., Suggested low cholesterol diet., Encouraged aerobic exercise., Discussed foot care., Reminded to get yearly retinal exam. - Hemoglobin A1c - Insulin, fasting  3. Hypothyroidism, unspecified hypothyroidism type Hypothyroidism-check TSH level, continue medications the same, reminded to take on an empty stomach 30-22mins before food.  - TSH  4. Hyperlipidemia -continue medications, check lipids, decrease fatty foods, increase activity. - Lipid panel  5. Vitamin D deficiency - Vit D  25 hydroxy (rtn osteoporosis monitoring)  6. Medication management - Magnesium  7. Obstructive sleep apnea Has been 5 years since mouth piece, getting up 3-4 times a night with increased fatigue, suggest follow up with dental sleep doctor for evaluation, given numbers  8. Gastroesophageal reflux disease without esophagitis GERD- will try to get off PPiI given info for taper and zantac sent in  9. Allergic rhinitis, unspecified allergic rhinitis type Continue OTC allergy pills  10. Nonintractable headache, unspecified chronicity pattern, unspecified headache type Check labs, normal neuro  11. Vaginal atrophy Continue premarin cream  12. Iron deficiency anemia Check CBC  13. PULMONARY EMBOLISM, HX OF S/p fracture, monitor  14.  Depression remission  15. Anxiety Declines meds at this time, continue xanax PRN  16. ADD (attention deficit disorder) - amphetamine-dextroamphetamine (ADDERALL) 20 MG tablet; Take 1/2 to 1 tablet if needed for ADD  Dispense: 30 tablet; Refill: 0  17. OAB (overactive bladder) Declines meds at this time, bladder matters given  18. Other fatigue ? From sinus brady cut atenolol in half and monitor BP/HR - Iron and TIBC - Ferritin - Vitamin B12  19. Tinea corporis - clotrimazole-betamethasone (LOTRISONE) cream; Apply 1 application topically 2 (two) times daily.  Dispense: 45 g; Refill: 2   Discussed med's effects and SE's. Screening labs and tests as requested with regular follow-up as recommended.  HPI 64 y.o. female  presents for a complete physical. Her blood pressure has been controlled at home, today their BP is BP: 120/62 mmHg She does not workout but she walks a lot for her job. She denies chest pain, shortness of breath, dizziness.  She is not on cholesterol medication and denies myalgias. Her cholesterol is at goal. The cholesterol last visit was:   Lab Results  Component Value Date   CHOL 196 03/27/2014   HDL 45 03/27/2014   LDLCALC 98 03/27/2014   LDLDIRECT 138.2 04/25/2008   TRIG 263* 03/27/2014   CHOLHDL 4.4 03/27/2014   She has been working on diet and exercise for prediabetes, and denies paresthesia of the feet, polydipsia and polyuria. Last A1C in the office was: Lab Results  Component Value Date   HGBA1C 5.8* 03/27/2014  Patient is on Vitamin D supplement.   Lab Results  Component Value Date   VD25OH 90 06/19/2013  She is on the adderall, 1/2 tablet 1-2 x a day mainly when she drives. She states for the last year she has had worsening fatigue for 1 year, she gets up 3-4  times to go to the bathroom. She has OSA and is on mouth guard x 5 years.  She has urinary frequency during the day as well, declines incontinence, does have urgency. She does have vaginal  atrophy and does premarin 2 x a week.  She has chronic joint pain bilateral feet and left knee, was on NSAIDS but decreased with last GFR being 75.  She is on thyroid medication. Her medication was not changed last visit. Patient denies heat / cold intolerance, nervousness and palpitations.  Lab Results  Component Value Date   TSH 1.002 03/27/2014  .   Current Medications:  Current Outpatient Prescriptions on File Prior to Visit  Medication Sig Dispense Refill  . ALPRAZolam (XANAX) 1 MG tablet 1/2-1 tablet as needed for sleep/anxiety 30 tablet 0  . atenolol (TENORMIN) 50 MG tablet TAKE 1/2 TO 1 TABLET BY MOUTH EVERY NIGHT AT BEDTIME 90 tablet 0  . carisoprodol (SOMA) 250 MG tablet Take 1 tablet (250 mg total) by mouth at bedtime. 30 tablet 0  . conjugated estrogens (PREMARIN) vaginal cream Place 1 Applicatorful vaginally daily. 42.5 g 3  . fluconazole (DIFLUCAN) 150 MG tablet Take 1 tablet (150 mg total) by mouth once. 1 tablet 3  . gabapentin (NEURONTIN) 300 MG capsule 1-2 pills at night, can take one during the day 90 capsule 0  . levothyroxine (SYNTHROID, LEVOTHROID) 125 MCG tablet Take 1 tablet (125 mcg total) by mouth daily. as directed 30 tablet 3  . lidocaine (LIDODERM) 5 % Place 1 patch onto the skin daily. Remove & Discard patch within 12 hours or as directed by MD 30 patch 3  . meloxicam (MOBIC) 15 MG tablet Take 1 tablet (15 mg total) by mouth daily. 90 tablet 1  . traMADol (ULTRAM) 50 MG tablet Take 1 tablet (50 mg total) by mouth every 6 (six) hours as needed. 90 tablet 0  . amphetamine-dextroamphetamine (ADDERALL) 20 MG tablet Take 1/2 to 1 tablet if needed for ADD 30 tablet 0   No current facility-administered medications on file prior to visit.   Health Maintenance:   Immunization History  Administered Date(s) Administered  . Influenza Whole 11/22/2007  . Td 04/25/2008   Tetanus: 2010 Pneumovax: declines Prevnar 13: N/A Flu vaccine: 2015 Zostavax: declines Pap  Dr.  Comer Locket 03/2012 due 2019, never abnormal MGM: 11/2013 DUE 11/2014 DEXA: 2007- would like to wait Colonoscopy: 07/2013 Dr. Ewing Schlein, due 5 years EGD: N/A [place] AB 2009 CT neck 2007 Eye doctor: April 2015 My eye doctor Dentist: Dr. Denyse Amass in [place], last year  Allergies:  Allergies  Allergen Reactions  . Cefprozil   . Codeine     REACTION: vomiting  . Hydrocodone-Acetaminophen   . Levofloxacin   . Morphine   . Pravastatin Swelling   Medical History:  Past Medical History  Diagnosis Date  . Hyperlipidemia   . Hypertension   . Anxiety   . GERD (gastroesophageal reflux disease)   . ADD (attention deficit disorder)   . Depression   . Vitamin D deficiency   . Hypothyroid    Surgical History:  Past Surgical History  Procedure Laterality Date  . Foot surgery Right   . Cesarean section    . Tonsillectomy  2002   Family History:  Family History  Problem Relation Age of Onset  . Heart disease Father   . Hypertension Father    Social History:  History  Substance Use Topics  . Smoking status: Never Smoker   . Smokeless tobacco:  Not on file  . Alcohol Use: Yes     Comment: occasional   Review of Systems  Constitutional: Positive for malaise/fatigue. Negative for fever, chills, weight loss and diaphoresis.  HENT: Negative.   Eyes: Negative.   Respiratory: Negative.   Cardiovascular: Positive for palpitations. Negative for chest pain (with chest tightness at night), orthopnea, claudication, leg swelling and PND.  Gastrointestinal: Positive for heartburn. Negative for nausea, vomiting, abdominal pain, diarrhea, constipation, blood in stool and melena.  Genitourinary: Positive for urgency and frequency. Negative for dysuria, hematuria and flank pain.       Vaginal dryness  Musculoskeletal: Positive for joint pain. Negative for myalgias, back pain, falls and neck pain.  Skin: Positive for rash (buttocks). Negative for itching.  Neurological: Negative.  Negative for  dizziness and weakness.  Psychiatric/Behavioral: Negative for depression, suicidal ideas, hallucinations, memory loss and substance abuse. The patient is nervous/anxious. The patient does not have insomnia.     Physical Exam: Estimated body mass index is 28.78 kg/(m^2) as calculated from the following:   Height as of this encounter: 5' 2.5" (1.588 m).   Weight as of this encounter: 160 lb (72.576 kg). BP 120/62 mmHg  Pulse 60  Temp(Src) 97.7 F (36.5 C)  Resp 16  Ht 5' 2.5" (1.588 m)  Wt 160 lb (72.576 kg)  BMI 28.78 kg/m2 General Appearance: Well nourished, in no apparent distress. Eyes: PERRLA, EOMs, conjunctiva no swelling or erythema, normal fundi and vessels. Sinuses: No Frontal/maxillary tenderness ENT/Mouth: Ext aud canals clear, normal light reflex with TMs without erythema, bulging.  Good dentition. No erythema, swelling, or exudate on post pharynx. Tonsils not swollen or erythematous. Hearing normal.  Neck: Supple, thyroid normal. No bruits Respiratory: Respiratory effort normal, BS equal bilaterally without rales, rhonchi, wheezing or stridor. Cardio: RRR without murmurs, rubs or gallops. Brisk peripheral pulses without edema.  Chest: symmetric, with normal excursions and percussion. Breasts: defer Abdomen: Soft, +BS. Non tender, no guarding, rebound, hernias, masses, or organomegaly. .  Lymphatics: Non tender without lymphadenopathy.  Genitourinary: defer Musculoskeletal: Full ROM all peripheral extremities,5/5 strength, and normal gait. Skin: Erythematous rash on gluteal fold with satellite lesions without ulceration, warmth. No lesions, ecchymosis.  Neuro: Cranial nerves intact, reflexes equal bilaterally. Normal muscle tone, no cerebellar symptoms. Sensation intact.  Psych: Awake and oriented X 3, normal affect, Insight and Judgment appropriate.   EKG: sinus bradycardia with no ST changes with fatigue will cut atenolol in half, monitor BP/HR Aorta Scan  Normal    Diana Wolfe, Diana Wolfe 10:15 AM

## 2014-06-23 NOTE — Patient Instructions (Addendum)
Can call Dr. Toy Cookey to get evaluated for dental sleep appliance. # [phone]  OR you can try Dr. Clearence Ped in [facility] # [phone]  We will send notes. Call and get price quote on both.   GETTING OFF OF PPI's    Nexium/protonix/prilosec/Omeprazole/Dexilant/Aciphex are called PPI's, they are great at healing your stomach but should only be taken for a short period of time.     Recent studies have shown that taken for a long time they  can increase the risk of osteoporosis (weakening of your bones), pneumonia, low magnesium, restless legs, Cdiff (infection that causes diarrhea), DEMENTIA and most recently kidney damage / disease / insufficiency.     Due to this information we want to try to stop the PPI but if you try to stop it abruptly this can cause rebound acid and worsening symptoms.   So this is how we want you to get off the PPI:  - Start taking the nexium/protonix/prilosec/PPI  every other day with  zantac (ranitidine) 2 x a day for 2-4 weeks  - then decrease the PPI to every 3 days while taking the zantac (ranitidine) twice a day the other  days for 2-4  Weeks  - then you can try the zantac (ranitidine) once at night or up to 2 x day as needed.  - you can continue on this once at night or stop all together  - Avoid alcohol, spicy foods, NSAIDS (aleve, ibuprofen) at this time. See foods below.   +++++++++++++++++++++++++++++++++++++++++++  Food Choices for Gastroesophageal Reflux Disease  When you have gastroesophageal reflux disease (GERD), the foods you eat and your eating habits are very important. Choosing the right foods can help ease the discomfort of GERD. WHAT GENERAL GUIDELINES DO I NEED TO FOLLOW?  Choose fruits, vegetables, whole grains, low-fat dairy products, and low-fat meat, fish, and poultry.  Limit fats such as oils, salad dressings, butter, nuts, and avocado.  Keep a food diary to identify foods that cause symptoms.  Avoid foods that  cause reflux. These may be different for different people.  Eat frequent small meals instead of three large meals each day.  Eat your meals slowly, in a relaxed setting.  Limit fried foods.  Cook foods using methods other than frying.  Avoid drinking alcohol.  Avoid drinking large amounts of liquids with your meals.  Avoid bending over or lying down until 2-3 hours after eating.   WHAT FOODS ARE NOT RECOMMENDED? The following are some foods and drinks that may worsen your symptoms:  Vegetables Tomatoes. Tomato juice. Tomato and spaghetti sauce. Chili peppers. Onion and garlic. Horseradish. Fruits Oranges, grapefruit, and lemon (fruit and juice). Meats High-fat meats, fish, and poultry. This includes hot dogs, ribs, ham, sausage, salami, and bacon. Dairy Whole milk and chocolate milk. Sour cream. Cream. Butter. Ice cream. Cream cheese.  Beverages Coffee and tea, with or without caffeine. Carbonated beverages or energy drinks. Condiments Hot sauce. Barbecue sauce.  Sweets/Desserts Chocolate and cocoa. Donuts. Peppermint and spearmint. Fats and Oils High-fat foods, including [place] fries and potato chips. Other Vinegar. Strong spices, such as black pepper, white pepper, red pepper, cayenne, curry powder, cloves, ginger, and chili powder. Nexium/protonix/prilosec are called PPI's, they are great at healing your stomach but should only be taken for a short period of time.   Preventive Care for Adults A healthy lifestyle and preventive care can promote health and wellness. Preventive health guidelines for women include the following key practices.  A routine yearly physical is a good way to check with your health care provider about your health and preventive screening. It is a chance to share any concerns and updates on your health and to receive a thorough exam.  Visit your dentist for a routine exam and preventive care every 6 months. Brush your teeth twice a day and floss  once a day. Good oral hygiene prevents tooth decay and gum disease.  The frequency of eye exams is based on your age, health, family medical history, use of contact lenses, and other factors. Follow your health care provider's recommendations for frequency of eye exams.  Eat a healthy diet. Foods like vegetables, fruits, whole grains, low-fat dairy products, and lean protein foods contain the nutrients you need without too many calories. Decrease your intake of foods high in solid fats, added sugars, and salt. Eat the right amount of calories for you.Get information about a proper diet from your health care provider, if necessary.  Regular physical exercise is one of the most important things you can do for your health. Most adults should get at least 150 minutes of moderate-intensity exercise (any activity that increases your heart rate and causes you to sweat) each week. In addition, most adults need muscle-strengthening exercises on 2 or more days a week.  Maintain a healthy weight. The body mass index (BMI) is a screening tool to identify possible weight problems. It provides an estimate of body fat based on height and weight. Your health care provider can find your BMI and can help you achieve or maintain a healthy weight.For adults 20 years and older:  A BMI below 18.5 is considered underweight.  A BMI of 18.5 to 24.9 is normal.  A BMI of 25 to 29.9 is considered overweight.  A BMI of 30 and above is considered obese.  Maintain normal blood lipids and cholesterol levels by exercising and minimizing your intake of saturated fat. Eat a balanced diet with plenty of fruit and vegetables. If your lipid or cholesterol levels are high, you are over 50, or you are at high risk for heart disease, you may need your cholesterol levels checked more frequently.Ongoing high lipid and cholesterol levels should be treated with medicines if diet and exercise are not working.  If you smoke, find out from  your health care provider how to quit. If you do not use tobacco, do not start.  Lung cancer screening is recommended for adults aged 67-80 years who are at high risk for developing lung cancer because of a history of smoking. A yearly low-dose CT scan of the lungs is recommended for people who have at least a 30-pack-year history of smoking and are a current smoker or have quit within the past 15 years. A pack year of smoking is smoking an average of 1 pack of cigarettes a day for 1 year (for example: 1 pack a day for 30 years or 2 packs a day for 15 years). Yearly screening should continue until the smoker has stopped smoking for at least 15 years. Yearly screening should be stopped for people who develop a health problem that would prevent them from having lung cancer treatment.  Avoid use of street drugs. Do not share needles with anyone. Ask for help if you need support or instructions about stopping the use of drugs.  High blood pressure causes heart disease and increases the risk of stroke.  Ongoing high blood pressure should be treated with medicines if weight loss and exercise  do not work.  If you are 3-33 years old, ask your health care provider if you should take aspirin to prevent strokes.  Diabetes screening involves taking a blood sample to check your fasting blood sugar level. This should be done once every 3 years, after age 30, if you are within normal weight and without risk factors for diabetes. Testing should be considered at a younger age or be carried out more frequently if you are overweight and have at least 1 risk factor for diabetes.  Breast cancer screening is essential preventive care for women. You should practice "breast self-awareness." This means understanding the normal appearance and feel of your breasts and may include breast self-examination. Any changes detected, no matter how small, should be reported to a health care provider. Women in their 8s and 30s should have  a clinical breast exam (CBE) by a health care provider as part of a regular health exam every 1 to 3 years. After age 79, women should have a CBE every year. Starting at age 76, women should consider having a mammogram (breast X-ray test) every year. Women who have a family history of breast cancer should talk to their health care provider about genetic screening. Women at a high risk of breast cancer should talk to their health care providers about having an MRI and a mammogram every year.  Breast cancer gene (BRCA)-related cancer risk assessment is recommended for women who have family members with BRCA-related cancers. BRCA-related cancers include breast, ovarian, tubal, and peritoneal cancers. Having family members with these cancers may be associated with an increased risk for harmful changes (mutations) in the breast cancer genes BRCA1 and BRCA2. Results of the assessment will determine the need for genetic counseling and BRCA1 and BRCA2 testing.  Routine pelvic exams to screen for cancer are no longer recommended for nonpregnant women who are considered low risk for cancer of the pelvic organs (ovaries, uterus, and vagina) and who do not have symptoms. Ask your health care provider if a screening pelvic exam is right for you.  If you have had past treatment for cervical cancer or a condition that could lead to cancer, you need Pap tests and screening for cancer for at least 20 years after your treatment. If Pap tests have been discontinued, your risk factors (such as having a new sexual partner) need to be reassessed to determine if screening should be resumed. Some women have medical problems that increase the chance of getting cervical cancer. In these cases, your health care provider may recommend more frequent screening and Pap tests.    Colorectal cancer can be detected and often prevented. Most routine colorectal cancer screening begins at the age of 23 years and continues through age 37  years. However, your health care provider may recommend screening at an earlier age if you have risk factors for colon cancer. On a yearly basis, your health care provider may provide home test kits to check for hidden blood in the stool. Use of a small camera at the end of a tube, to directly examine the colon (sigmoidoscopy or colonoscopy), can detect the earliest forms of colorectal cancer. Talk to your health care provider about this at age 13, when routine screening begins. Direct exam of the colon should be repeated every 5-10 years through age 84 years, unless early forms of pre-cancerous polyps or small growths are found.  Osteoporosis is a disease in which the bones lose minerals and strength with aging. This can result in serious  bone fractures or breaks. The risk of osteoporosis can be identified using a bone density scan. Women ages 4 years and over and women at risk for fractures or osteoporosis should discuss screening with their health care providers. Ask your health care provider whether you should take a calcium supplement or vitamin D to reduce the rate of osteoporosis.  Menopause can be associated with physical symptoms and risks. Hormone replacement therapy is available to decrease symptoms and risks. You should talk to your health care provider about whether hormone replacement therapy is right for you.  Use sunscreen. Apply sunscreen liberally and repeatedly throughout the day. You should seek shade when your shadow is shorter than you. Protect yourself by wearing long sleeves, pants, a wide-brimmed hat, and sunglasses year round, whenever you are outdoors.  Once a month, do a whole body skin exam, using a mirror to look at the skin on your back. Tell your health care provider of new moles, moles that have irregular borders, moles that are larger than a pencil eraser, or moles that have changed in shape or color.  Stay current with required vaccines (immunizations).  Influenza  vaccine. All adults should be immunized every year.  Tetanus, diphtheria, and acellular pertussis (Td, Tdap) vaccine. Pregnant women should receive 1 dose of Tdap vaccine during each pregnancy. The dose should be obtained regardless of the length of time since the last dose. Immunization is preferred during the 27th-36th week of gestation. An adult who has not previously received Tdap or who does not know her vaccine status should receive 1 dose of Tdap. This initial dose should be followed by tetanus and diphtheria toxoids (Td) booster doses every 10 years. Adults with an unknown or incomplete history of completing a 3-dose immunization series with Td-containing vaccines should begin or complete a primary immunization series including a Tdap dose. Adults should receive a Td booster every 10 years.    Zoster vaccine. One dose is recommended for adults aged 1 years or older unless certain conditions are present.    Pneumococcal 13-valent conjugate (PCV13) vaccine. When indicated, a person who is uncertain of her immunization history and has no record of immunization should receive the PCV13 vaccine. An adult aged 29 years or older who has certain medical conditions and has not been previously immunized should receive 1 dose of PCV13 vaccine. This PCV13 should be followed with a dose of pneumococcal polysaccharide (PPSV23) vaccine. The PPSV23 vaccine dose should be obtained at least 8 weeks after the dose of PCV13 vaccine. An adult aged 19 years or older who has certain medical conditions and previously received 1 or more doses of PPSV23 vaccine should receive 1 dose of PCV13. The PCV13 vaccine dose should be obtained 1 or more years after the last PPSV23 vaccine dose.    Pneumococcal polysaccharide (PPSV23) vaccine. When PCV13 is also indicated, PCV13 should be obtained first. All adults aged 40 years and older should be immunized. An adult younger than age 36 years who has certain medical conditions  should be immunized. Any person who resides in a nursing home or long-term care facility should be immunized. An adult smoker should be immunized. People with an immunocompromised condition and certain other conditions should receive both PCV13 and PPSV23 vaccines. People with human immunodeficiency virus (HIV) infection should be immunized as soon as possible after diagnosis. Immunization during chemotherapy or radiation therapy should be avoided. Routine use of PPSV23 vaccine is not recommended for American Indians, [place] Natives, or people younger than 71  years unless there are medical conditions that require PPSV23 vaccine. When indicated, people who have unknown immunization and have no record of immunization should receive PPSV23 vaccine. One-time revaccination 5 years after the first dose of PPSV23 is recommended for people aged 19-64 years who have chronic kidney failure, nephrotic syndrome, asplenia, or immunocompromised conditions. People who received 1-2 doses of PPSV23 before age 52 years should receive another dose of PPSV23 vaccine at age 49 years or later if at least 5 years have passed since the previous dose. Doses of PPSV23 are not needed for people immunized with PPSV23 at or after age 56 years.   Preventive Services / Frequency  Ages 34 years and over  Blood pressure check.  Lipid and cholesterol check.  Lung cancer screening. / Every year if you are aged 102-80 years and have a 30-pack-year history of smoking and currently smoke or have quit within the past 15 years. Yearly screening is stopped once you have quit smoking for at least 15 years or develop a health problem that would prevent you from having lung cancer treatment.  Clinical breast exam.** / Every year after age 10 years.  BRCA-related cancer risk assessment.** / For women who have family members with a BRCA-related cancer (breast, ovarian, tubal, or peritoneal cancers).  Mammogram.** / Every year beginning at age  42 years and continuing for as long as you are in good health. Consult with your health care provider.  Pap test.** / Every 3 years starting at age 71 years through age 57 or 106 years with 3 consecutive normal Pap tests. Testing can be stopped between 65 and 70 years with 3 consecutive normal Pap tests and no abnormal Pap or HPV tests in the past 10 years.  Fecal occult blood test (FOBT) of stool. / Every year beginning at age 67 years and continuing until age 14 years. You may not need to do this test if you get a colonoscopy every 10 years.  Flexible sigmoidoscopy or colonoscopy.** / Every 5 years for a flexible sigmoidoscopy or every 10 years for a colonoscopy beginning at age 54 years and continuing until age 68 years.  Hepatitis C blood test.** / For all people born from [birth year] through 1963 and any individual with known risks for hepatitis C.  Osteoporosis screening.** / A one-time screening for women ages 108 years and over and women at risk for fractures or osteoporosis.  Skin self-exam. / Monthly.  Influenza vaccine. / Every year.  Tetanus, diphtheria, and acellular pertussis (Tdap/Td) vaccine.** / 1 dose of Td every 10 years.  Zoster vaccine.** / 1 dose for adults aged 75 years or older.  Pneumococcal 13-valent conjugate (PCV13) vaccine.** / Consult your health care provider.  Pneumococcal polysaccharide (PPSV23) vaccine.** / 1 dose for all adults aged 60 years and older. Screening for abdominal aortic aneurysm (AAA)  by ultrasound is recommended for people who have history of high blood pressure or who are current or former smokers.

## 2014-06-24 LAB — BASIC METABOLIC PANEL WITH GFR
BUN: 17 mg/dL (ref 6–23)
CO2: 27 mEq/L (ref 19–32)
Calcium: 9.8 mg/dL (ref 8.4–10.5)
Chloride: 103 mEq/L (ref 96–112)
Creat: 0.89 mg/dL (ref 0.50–1.10)
GFR, Est African American: 80 mL/min
GFR, Est Non African American: 70 mL/min
Glucose, Bld: 86 mg/dL (ref 70–99)
Potassium: 4.4 mEq/L (ref 3.5–5.3)
Sodium: 139 mEq/L (ref 135–145)

## 2014-06-24 LAB — HEPATIC FUNCTION PANEL
ALT: 22 U/L (ref 0–35)
AST: 20 U/L (ref 0–37)
Albumin: 4.3 g/dL (ref 3.5–5.2)
Alkaline Phosphatase: 76 U/L (ref 39–117)
Bilirubin, Direct: 0.1 mg/dL (ref 0.0–0.3)
Indirect Bilirubin: 0.5 mg/dL (ref 0.2–1.2)
Total Bilirubin: 0.6 mg/dL (ref 0.2–1.2)
Total Protein: 6.7 g/dL (ref 6.0–8.3)

## 2014-06-24 LAB — IRON AND TIBC
%SAT: 40 % (ref 20–55)
Iron: 147 ug/dL — ABNORMAL HIGH (ref 42–145)
TIBC: 368 ug/dL (ref 250–470)
UIBC: 221 ug/dL (ref 125–400)

## 2014-06-24 LAB — URINALYSIS, ROUTINE W REFLEX MICROSCOPIC
Bilirubin Urine: NEGATIVE
Glucose, UA: NEGATIVE mg/dL
Hgb urine dipstick: NEGATIVE
Ketones, ur: NEGATIVE mg/dL
Leukocytes, UA: NEGATIVE
Nitrite: NEGATIVE
Protein, ur: NEGATIVE mg/dL
Specific Gravity, Urine: 1.022 (ref 1.005–1.030)
Urobilinogen, UA: 0.2 mg/dL (ref 0.0–1.0)
pH: 5.5 (ref 5.0–8.0)

## 2014-06-24 LAB — MICROALBUMIN / CREATININE URINE RATIO
Creatinine, Urine: 190.1 mg/dL
Microalb Creat Ratio: 1.6 mg/g (ref 0.0–30.0)
Microalb, Ur: 0.3 mg/dL (ref <2.0)

## 2014-06-24 LAB — LIPID PANEL
Cholesterol: 185 mg/dL (ref 0–200)
HDL: 43 mg/dL — ABNORMAL LOW (ref >=46)
LDL Cholesterol: 95 mg/dL (ref 0–99)
Total CHOL/HDL Ratio: 4.3 Ratio
Triglycerides: 234 mg/dL — ABNORMAL HIGH (ref <150)
VLDL: 47 mg/dL — ABNORMAL HIGH (ref 0–40)

## 2014-06-24 LAB — INSULIN, FASTING: Insulin fasting, serum: 7.8 u[IU]/mL (ref 2.0–19.6)

## 2014-06-24 LAB — VITAMIN D 25 HYDROXY (VIT D DEFICIENCY, FRACTURES): Vit D, 25-Hydroxy: 33 ng/mL (ref 30–100)

## 2014-06-24 LAB — TSH: TSH: 1.486 u[IU]/mL (ref 0.350–4.500)

## 2014-06-24 LAB — FERRITIN: Ferritin: 29 ng/mL (ref 10–291)

## 2014-06-24 LAB — VITAMIN B12: Vitamin B-12: 249 pg/mL (ref 211–911)

## 2014-06-24 LAB — MAGNESIUM: Magnesium: 2.3 mg/dL (ref 1.5–2.5)

## 2014-08-09 ENCOUNTER — Other Ambulatory Visit: Payer: Self-pay | Admitting: Physician Assistant

## 2014-09-23 ENCOUNTER — Encounter: Payer: Self-pay | Admitting: Physician Assistant

## 2014-09-23 ENCOUNTER — Ambulatory Visit (INDEPENDENT_AMBULATORY_CARE_PROVIDER_SITE_OTHER): Payer: BLUE CROSS/BLUE SHIELD | Admitting: Physician Assistant

## 2014-09-23 ENCOUNTER — Other Ambulatory Visit: Payer: Self-pay | Admitting: Physician Assistant

## 2014-09-23 VITALS — BP 134/70 | HR 64 | Temp 97.3°F | Resp 16 | Ht 62.5 in | Wt 158.0 lb

## 2014-09-23 DIAGNOSIS — M940 Chondrocostal junction syndrome [Tietze]: Secondary | ICD-10-CM

## 2014-09-23 DIAGNOSIS — F988 Other specified behavioral and emotional disorders with onset usually occurring in childhood and adolescence: Secondary | ICD-10-CM

## 2014-09-23 MED ORDER — AMPHETAMINE-DEXTROAMPHETAMINE 20 MG PO TABS
ORAL_TABLET | ORAL | Status: DC
Start: 2014-09-23 — End: 2014-12-22

## 2014-09-23 MED ORDER — DOXYCYCLINE HYCLATE 100 MG PO TABS: 100.0000 mg | ORAL_TABLET | Freq: Two times a day (BID) | ORAL | Status: DC

## 2014-09-23 NOTE — Progress Notes (Signed)
Subjective:    Patient ID: Diana Wolfe, female    DOB: 1950-10-04, 64 y.o.   MRN: [id number]  HPI 64 y.o. female with history of HTN, preDM, thyroid, chol presents with left shoulder pain. She travels for work, states she had URI 2 weeks ago, had coughing x 10 dyas, went to UC in [place] and got zpak, started July 21st. States she had a lot of coughing that could have contributed to her shoulder, feeling better now. Yesterday afternoon, started to have right frontal/clavical pain, then moved to her posterior shoulder under her shoulder blade. Was very sharp but now dull ache. Nothing worse, has gotten better, not taking anything. Had some nausea last night, no CP, SOB, diaphoresis, palpitations, leg edema.   Has also had an itch on her lower back, unable to see it.   Blood pressure 134/70, pulse 64, temperature 97.3 F (36.3 C), resp. rate 16, height 5' 2.5" (1.588 m), weight 158 lb (71.668 kg).   Current Outpatient Prescriptions on File Prior to Visit  Medication Sig Dispense Refill  . ALPRAZolam (XANAX) 1 MG tablet 1/2-1 tablet as needed for sleep/anxiety 30 tablet 0  . amphetamine-dextroamphetamine (ADDERALL) 20 MG tablet Take 1/2 to 1 tablet if needed for ADD 30 tablet 0  . atenolol (TENORMIN) 50 MG tablet TAKE 1/2 TO 1 TABLET BY MOUTH EVERY NIGHT AT BEDTIME 90 tablet 0  . carisoprodol (SOMA) 250 MG tablet Take 1 tablet (250 mg total) by mouth at bedtime. 30 tablet 0  . clotrimazole-betamethasone (LOTRISONE) cream Apply 1 application topically 2 (two) times daily. 45 g 2  . conjugated estrogens (PREMARIN) vaginal cream Place 1 Applicatorful vaginally daily. 42.5 g 3  . fluconazole (DIFLUCAN) 150 MG tablet Take 1 tablet (150 mg total) by mouth once. 1 tablet 3  . gabapentin (NEURONTIN) 300 MG capsule 1-2 pills at night, can take one during the day 90 capsule 0  . levothyroxine (SYNTHROID, LEVOTHROID) 125 MCG tablet Take on an empty stomach for 30 minutes - due 6 month  f/u OV in Oct 90  tablet 0  . lidocaine (LIDODERM) 5 % Place 1 patch onto the skin daily. Remove & Discard patch within 12 hours or as directed by MD 30 patch 3  . meloxicam (MOBIC) 15 MG tablet Take 1 tablet (15 mg total) by mouth daily. 90 tablet 1  . ranitidine (ZANTAC) 300 MG tablet Take twice a day while trying to get off PPI, then can go to once at night 60 tablet 3  . traMADol (ULTRAM) 50 MG tablet Take 1 tablet (50 mg total) by mouth every 6 (six) hours as needed. 90 tablet 0   No current facility-administered medications on file prior to visit.    Past Medical History  Diagnosis Date  . Hyperlipidemia   . Hypertension   . Anxiety   . GERD (gastroesophageal reflux disease)   . ADD (attention deficit disorder)   . Depression   . Vitamin D deficiency   . Hypothyroid     Review of Systems  Constitutional: Negative.   HENT: Negative.   Respiratory: Negative.   Cardiovascular: Positive for chest pain. Negative for palpitations and leg swelling.  Gastrointestinal: Negative.   Genitourinary: Negative.   Musculoskeletal: Positive for arthralgias. Negative for myalgias, back pain, joint swelling, gait problem, neck pain and neck stiffness.  Skin: Positive for rash.       Objective:   Physical Exam  Constitutional: She appears well-developed and well-nourished.  HENT:  Head:  Normocephalic and atraumatic.  Cardiovascular: Normal rate and regular rhythm.   Pulmonary/Chest: Effort normal and breath sounds normal. She has no wheezes.  Abdominal: Soft. Bowel sounds are normal. There is no tenderness.  Musculoskeletal:  + tenderness to palpation left upper chest, full ROM left shoulder without pain  Skin: Skin is warm and dry.  + tick on lower back, unknown period of time, noticed 4-5 days ago.      Assessment & Plan:  Chest pain- likely costochondritis + tenderness to palpations on chest, non exertional, with no accompaniments- start mobic and meds PRN Go to the ER if any CP, SOB, nausea,  dizziness, severe HA, changes vision/speech  Deer Tick lower back Was in [place], longer than 1 week, will give doxycycline 2 weeks

## 2014-09-23 NOTE — Patient Instructions (Addendum)
Costochondritis Costochondritis, sometimes called Tietze syndrome, is a swelling and irritation (inflammation) of the tissue (cartilage) that connects your ribs with your breastbone (sternum). It causes pain in the chest and rib area. Costochondritis usually goes away on its own over time. It can take up to 6 weeks or longer to get better, especially if you are unable to limit your activities. CAUSES  Some cases of costochondritis have no known cause. Possible causes include:  Injury (trauma).  Exercise or activity such as lifting.  Severe coughing. SIGNS AND SYMPTOMS  Pain and tenderness in the chest and rib area.  Pain that gets worse when coughing or taking deep breaths.  Pain that gets worse with specific movements. DIAGNOSIS  Your health care provider will do a physical exam and ask about your symptoms. Chest X-rays or other tests may be done to rule out other problems. TREATMENT  Costochondritis usually goes away on its own over time. Your health care provider may prescribe medicine to help relieve pain. HOME CARE INSTRUCTIONS   Avoid exhausting physical activity. Try not to strain your ribs during normal activity. This would include any activities using chest, abdominal, and side muscles, especially if heavy weights are used.  Apply ice to the affected area for the first 2 days after the pain begins.  Put ice in a plastic bag.  Place a towel between your skin and the bag.  Leave the ice on for 20 minutes, 2-3 times a day.  Only take over-the-counter or prescription medicines as directed by your health care provider. SEEK MEDICAL CARE IF:  You have redness or swelling at the rib joints. These are signs of infection.  Your pain does not go away despite rest or medicine. SEEK IMMEDIATE MEDICAL CARE IF:   Your pain increases or you are very uncomfortable.  You have shortness of breath or difficulty breathing.  You cough up blood.  You have worse chest pains,  sweating, or vomiting.  You have a fever or persistent symptoms for more than 2-3 days.  You have a fever and your symptoms suddenly get worse. MAKE SURE YOU:   Understand these instructions.  Will watch your condition.  Will get help right away if you are not doing well or get worse. Document Released: 11/17/2004 Document Revised: 11/28/2012 Document Reviewed: 09/11/2012 [employer] Patient Information 2015 [employer]. This information is not intended to replace advice given to you by your health care provider. Make sure you discuss any questions you have with your health care provider.  

## 2014-09-25 ENCOUNTER — Encounter: Payer: Self-pay | Admitting: Physician Assistant

## 2014-10-02 ENCOUNTER — Encounter: Payer: Self-pay | Admitting: Physician Assistant

## 2014-10-02 ENCOUNTER — Other Ambulatory Visit: Payer: Self-pay | Admitting: Physician Assistant

## 2014-10-02 MED ORDER — FLUCONAZOLE 150 MG PO TABS: 150.0000 mg | ORAL_TABLET | Freq: Once | ORAL | Status: DC

## 2014-11-12 ENCOUNTER — Other Ambulatory Visit: Payer: Self-pay | Admitting: Internal Medicine

## 2014-11-13 ENCOUNTER — Other Ambulatory Visit: Payer: Self-pay | Admitting: Internal Medicine

## 2014-11-19 ENCOUNTER — Ambulatory Visit (INDEPENDENT_AMBULATORY_CARE_PROVIDER_SITE_OTHER): Payer: BLUE CROSS/BLUE SHIELD | Admitting: Physician Assistant

## 2014-11-19 ENCOUNTER — Encounter: Payer: Self-pay | Admitting: Physician Assistant

## 2014-11-19 VITALS — BP 120/70 | HR 77 | Temp 97.9°F | Resp 16 | Ht 62.5 in | Wt 162.0 lb

## 2014-11-19 DIAGNOSIS — L570 Actinic keratosis: Secondary | ICD-10-CM | POA: Diagnosis not present

## 2014-11-19 DIAGNOSIS — Z79899 Other long term (current) drug therapy: Secondary | ICD-10-CM | POA: Diagnosis not present

## 2014-11-19 DIAGNOSIS — E039 Hypothyroidism, unspecified: Secondary | ICD-10-CM

## 2014-11-19 DIAGNOSIS — Z1839 Other retained organic fragments: Secondary | ICD-10-CM

## 2014-11-19 DIAGNOSIS — S20459D Superficial foreign body of unspecified back wall of thorax, subsequent encounter: Secondary | ICD-10-CM

## 2014-11-19 DIAGNOSIS — E559 Vitamin D deficiency, unspecified: Secondary | ICD-10-CM

## 2014-11-19 DIAGNOSIS — I10 Essential (primary) hypertension: Secondary | ICD-10-CM

## 2014-11-19 LAB — CBC WITH DIFFERENTIAL/PLATELET
Basophils Absolute: 0.1 10*3/uL (ref 0.0–0.1)
Basophils Relative: 1 % (ref 0–1)
Eosinophils Absolute: 0.2 10*3/uL (ref 0.0–0.7)
Eosinophils Relative: 3 % (ref 0–5)
HCT: 46.2 % — ABNORMAL HIGH (ref 36.0–46.0)
Hemoglobin: 16.1 g/dL — ABNORMAL HIGH (ref 12.0–15.0)
Lymphocytes Relative: 24 % (ref 12–46)
Lymphs Abs: 2 10*3/uL (ref 0.7–4.0)
MCH: 30.1 pg (ref 26.0–34.0)
MCHC: 34.8 g/dL (ref 30.0–36.0)
MCV: 86.4 fL (ref 78.0–100.0)
MPV: 9.6 fL (ref 8.6–12.4)
Monocytes Absolute: 0.7 10*3/uL (ref 0.1–1.0)
Monocytes Relative: 8 % (ref 3–12)
Neutro Abs: 5.2 10*3/uL (ref 1.7–7.7)
Neutrophils Relative %: 64 % (ref 43–77)
Platelets: 300 10*3/uL (ref 150–400)
RBC: 5.35 MIL/uL — ABNORMAL HIGH (ref 3.87–5.11)
RDW: 13.8 % (ref 11.5–15.5)
WBC: 8.2 10*3/uL (ref 4.0–10.5)

## 2014-11-19 NOTE — Patient Instructions (Signed)
We want weight loss that will last so you should lose 1-2 pounds a week.  THAT IS IT! Please pick THREE things a month to change. Once it is a habit check off the item. Then pick another three items off the list to become habits.  If you are already doing a habit on the list GREAT!  Cross that item off! o Don't drink your calories. Ie, alcohol, soda, fruit juice, and sweet tea.  o Drink more water. Drink a glass when you feel hungry or before each meal.  o Eat breakfast - Complex carb and protein (likeDannon light and fit yogurt, oatmeal, fruit, eggs, turkey bacon). o Measure your cereal.  Eat no more than one cup a day. (ie Kashi) o Eat an apple a day. o Add a vegetable a day. o Try a new vegetable a month. o Use Pam! Stop using oil or butter to cook. o Don't finish your plate or use smaller plates. o Share your dessert. o Eat sugar free Jello for dessert or frozen grapes. o Don't eat 2-3 hours before bed. o Switch to whole wheat bread, pasta, and brown rice. o Make healthier choices when you eat out. No fries! o Pick baked chicken, NOT fried. o Don't forget to SLOW DOWN when you eat. It is not going anywhere.  o Take the stairs. o Park far away in the parking lot o Lift soup cans (or weights) for 10 minutes while watching TV. o Walk at work for 10 minutes during break. o Walk outside 1 time a week with your friend, kids, dog, or significant other. o Start a walking group at church. o Walk the mall as much as you can tolerate.  o Keep a food diary. o Weigh yourself daily. o Walk for 15 minutes 3 days per week. o Cook at home more often and eat out less.  If life happens and you go back to old habits, it is okay.  Just start over. You can do it!   If you experience chest pain, get short of breath, or tired during the exercise, please stop immediately and inform your doctor.   Before you even begin to attack a weight-loss plan, it pays to remember this: You are not fat. You have fat.  Losing weight isn't about blame or shame; it's simply another achievement to accomplish. Dieting is like any other skill-you have to buckle down and work at it. As long as you act in a smart, reasonable way, you'll ultimately get where you want to be. Here are some weight loss pearls for you.  1. It's Not a Diet. It's a Lifestyle Thinking of a diet as something you're on and suffering through only for the short term doesn't work. To shed weight and keep it off, you need to make permanent changes to the way you eat. It's OK to indulge occasionally, of course, but if you cut calories temporarily and then revert to your old way of eating, you'll gain back the weight quicker than you can say yo-yo. Use it to lose it. Research shows that one of the best predictors of long-term weight loss is how many pounds you drop in the first month. For that reason, nutritionists often suggest being stricter for the first two weeks of your new eating strategy to build momentum. Cut out added sugar and alcohol and avoid unrefined carbs. After that, figure out how you can reincorporate them in a way that's healthy and maintainable.  2. There's a Right   Way to Exercise Working out burns calories and fat and boosts your metabolism by building muscle. But those trying to lose weight are notorious for overestimating the number of calories they burn and underestimating the amount they take in. Unfortunately, your system is biologically programmed to hold on to extra pounds and that means when you start exercising, your body senses the deficit and ramps up its hunger signals. If you're not diligent, you'll eat everything you burn and then some. Use it to lose it. Cardio gets all the exercise glory, but strength and interval training are the real heroes. They help you build lean muscle, which in turn increases your metabolism and calorie-burning ability 3. Don't Overreact to Mild Hunger Some people have a hard time losing weight because  of hunger anxiety. To them, being hungry is bad-something to be avoided at all costs-so they carry snacks with them and eat when they don't need to. Others eat because they're stressed out or bored. While you never want to get to the point of being ravenous (that's when bingeing is likely to happen), a hunger pang, a craving, or the fact that it's 3:00 p.m. should not send you racing for the vending machine or obsessing about the energy bar in your purse. Ideally, you should put off eating until your stomach is growling and it's difficult to concentrate.  Use it to lose it. When you feel the urge to eat, use the HALT method. Ask yourself, Am I really hungry? Or am I angry or anxious, lonely or bored, or tired? If you're still not certain, try the apple test. If you're truly hungry, an apple should seem delicious; if it doesn't, something else is going on. Or you can try drinking water and making yourself busy, if you are still hungry try a healthy snack.  4. Not All Calories Are Created Equal The mechanics of weight loss are pretty simple: Take in fewer calories than you use for energy. But the kind of food you eat makes all the difference. Processed food that's high in saturated fat and refined starch or sugar can cause inflammation that disrupts the hormone signals that tell your brain you're full. The result: You eat a lot more.  Use it to lose it. Clean up your diet. Swap in whole, unprocessed foods, including vegetables, lean protein, and healthy fats that will fill you up and give you the biggest nutritional bang for your calorie buck. In a few weeks, as your brain starts receiving regular hunger and fullness signals once again, you'll notice that you feel less hungry overall and naturally start cutting back on the amount you eat.  5. Protein, Produce, and Plant-Based Fats Are Your Weight-Loss Trinity Here's why eating the three Ps regularly will help you drop pounds. Protein fills you up. You need it  to build lean muscle, which keeps your metabolism humming so that you can torch more fat. People in a weight-loss program who ate double the recommended daily allowance for protein (about 110 grams for a 150-pound woman) lost 70 percent of their weight from fat, while people who ate the RDA lost only about 40 percent, one study found. Produce is packed with filling fiber. "It's very difficult to consume too many calories if you're eating a lot of vegetables. Example: Three cups of broccoli is a lot of food, yet only 93 calories. (Fruit is another story. It can be easy to overeat and can contain a lot of calories from sugar, so be sure to   monitor your intake.) Plant-based fats like olive oil and those in avocados and nuts are healthy and extra satiating.  Use it to lose it. Aim to incorporate each of the three Ps into every meal and snack. People who eat protein throughout the day are able to keep weight off, according to a study in the American Journal of Clinical Nutrition. In addition to meat, poultry and seafood, good sources are beans, lentils, eggs, tofu, and yogurt. As for fat, keep portion sizes in check by measuring out salad dressing, oil, and nut butters (shoot for one to two tablespoons). Finally, eat veggies or a little fruit at every meal. People who did that consumed 308 fewer calories but didn't feel any hungrier than when they didn't eat more produce.  7. How You Eat Is As Important As What You Eat In order for your brain to register that you're full, you need to focus on what you're eating. Sit down whenever you eat, preferably at a table. Turn off the TV or computer, put down your phone, and look at your food. Smell it. Chew slowly, and don't put another bite on your fork until you swallow. When women ate lunch this attentively, they consumed 30 percent less when snacking later than those who listened to an audiobook at lunchtime, according to a study in the British Journal of Nutrition. 8.  Weighing Yourself Really Works The scale provides the best evidence about whether your efforts are paying off. Seeing the numbers tick up or down or stagnate is motivation to keep going-or to rethink your approach. A 2015 study at [employer] found that daily weigh-ins helped people lose more weight, keep it off, and maintain that loss, even after two years. Use it to lose it. Step on the scale at the same time every day for the best results. If your weight shoots up several pounds from one weigh-in to the next, don't freak out. Eating a lot of salt the night before or having your period is the likely culprit. The number should return to normal in a day or two. It's a steady climb that you need to do something about. 9. Too Much Stress and Too Little Sleep Are Your Enemies When you're tired and frazzled, your body cranks up the production of cortisol, the stress hormone that can cause carb cravings. Not getting enough sleep also boosts your levels of ghrelin, a hormone associated with hunger, while suppressing leptin, a hormone that signals fullness and satiety. People on a diet who slept only five and a half hours a night for two weeks lost 55 percent less fat and were hungrier than those who slept eight and a half hours, according to a study in the [employer] Journal. Use it to lose it. Prioritize sleep, aiming for seven hours or more a night, which research shows helps lower stress. And make sure you're getting quality zzz's. If a snoring spouse or a fidgety cat wakes you up frequently throughout the night, you may end up getting the equivalent of just four hours of sleep, according to a study from [employer]. Keep pets out of the bedroom, and use a white-noise app to drown out snoring. 10. You Will Hit a plateau-And You Can Bust Through It As you slim down, your body releases much less leptin, the fullness hormone.  If you're not strength training, start right now.  Building muscle can raise your metabolism to help you overcome a plateau. To keep your body challenged   and burning calories, incorporate new moves and more intense intervals into your workouts or add another sweat session to your weekly routine. Alternatively, cut an extra 100 calories or so a day from your diet. Now that you've lost weight, your body simply doesn't need as much fuel.   

## 2014-11-19 NOTE — Progress Notes (Signed)
Assessment and Plan:  1. Hypertension -Continue medication, monitor blood pressure at home. Continue DASH diet.  Reminder to go to the ER if any CP, SOB, nausea, dizziness, severe HA, changes vision/speech, left arm numbness and tingling and jaw pain.  2. Cholesterol -Continue diet and exercise. Check cholesterol.   3. Actinic keratosis cryo  4. Vitamin D Def - check level and continue medications.   5. Hypothyroidism -check TSH level, continue medications the same, reminded to take on an empty stomach 30-14mins before food.   6. Recent tick exposure, treated with doxy Check labs  Continue diet and meds as discussed. Further disposition pending results of labs. Over 30 minutes of exam, counseling, chart review, and critical decision making was performed  Future Appointments Date Time Provider Department Center  06/23/2015 10:00 AM Quentin Mulling, PA-C [facility] None     HPI 64 y.o. female  presents for 3 month follow up on hypertension, cholesterol, prediabetes, and vitamin D deficiency.   Her blood pressure has been controlled at home, today their BP is BP: 120/70 mmHg  She does not workout. She denies chest pain, shortness of breath, dizziness.  She is on cholesterol medication and denies myalgias. Her cholesterol is not at goal. The cholesterol last visit was:   Lab Results  Component Value Date   CHOL 185 06/23/2014   HDL 43* 06/23/2014   LDLCALC 95 06/23/2014   LDLDIRECT 138.2 04/25/2008   TRIG 234* 06/23/2014   CHOLHDL 4.3 06/23/2014   Last Z6X in the office was:  Lab Results  Component Value Date   HGBA1C 5.6 06/23/2014   Patient is on Vitamin D supplement.   Lab Results  Component Value Date   VD25OH 33 06/23/2014      Current Medications:  Current Outpatient Prescriptions on File Prior to Visit  Medication Sig Dispense Refill  . ALPRAZolam (XANAX) 1 MG tablet 1/2-1 tablet as needed for sleep/anxiety 30 tablet 0  . atenolol (TENORMIN) 50 MG tablet TAKE  1/2 TO 1 TABLET BY MOUTH EVERY NIGHT AT BEDTIME 90 tablet 2  . carisoprodol (SOMA) 250 MG tablet Take 1 tablet (250 mg total) by mouth at bedtime. 30 tablet 0  . clotrimazole-betamethasone (LOTRISONE) cream Apply 1 application topically 2 (two) times daily. 45 g 2  . conjugated estrogens (PREMARIN) vaginal cream Place 1 Applicatorful vaginally daily. 42.5 g 3  . doxycycline (VIBRA-TABS) 100 MG tablet Take 1 tablet (100 mg total) by mouth 2 (two) times daily. 20 tablet 0  . levothyroxine (SYNTHROID, LEVOTHROID) 125 MCG tablet TAKE 1 TABLET BY MOUTH ON AN EMPTY STOMACH  - DUE FOR 6 MONTH  FOLLOW UP OFFICE VISIT IN OCT 90 tablet 1  . lidocaine (LIDODERM) 5 % Place 1 patch onto the skin daily. Remove & Discard patch within 12 hours or as directed by MD 30 patch 3  . meloxicam (MOBIC) 15 MG tablet Take 1 tablet (15 mg total) by mouth daily. 90 tablet 1  . ranitidine (ZANTAC) 300 MG tablet Take twice a day while trying to get off PPI, then can go to once at night 60 tablet 3  . traMADol (ULTRAM) 50 MG tablet Take 1 tablet (50 mg total) by mouth every 6 (six) hours as needed. 90 tablet 0  . amphetamine-dextroamphetamine (ADDERALL) 20 MG tablet Take 1/2 to 1 tablet if needed for ADD 30 tablet 0   No current facility-administered medications on file prior to visit.   Medical History:  Past Medical History  Diagnosis Date  .  Hyperlipidemia   . Hypertension   . Anxiety   . GERD (gastroesophageal reflux disease)   . ADD (attention deficit disorder)   . Depression   . Vitamin D deficiency   . Hypothyroid    Allergies:  Allergies  Allergen Reactions  . Cefprozil   . Codeine     REACTION: vomiting  . Hydrocodone-Acetaminophen   . Levofloxacin   . Morphine   . Pravastatin Swelling     Review of Systems:  Review of Systems  Constitutional: Negative.   HENT: Negative.   Eyes: Negative.   Respiratory: Negative.   Cardiovascular: Negative.   Gastrointestinal: Negative.   Genitourinary:  Negative.   Musculoskeletal: Negative.   Skin: Positive for rash. Negative for itching.  Neurological: Negative.   Endo/Heme/Allergies: Negative.   Psychiatric/Behavioral: Negative.     Family history- Review and unchanged Social history- Review and unchanged Physical Exam: BP 120/70 mmHg  Pulse 77  Temp(Src) 97.9 F (36.6 C) (Temporal)  Resp 16  Ht 5' 2.5" (1.588 m)  Wt 162 lb (73.483 kg)  BMI 29.14 kg/m2  SpO2 96% Wt Readings from Last 3 Encounters:  11/19/14 162 lb (73.483 kg)  09/23/14 158 lb (71.668 kg)  06/23/14 160 lb (72.576 kg)   General Appearance: Well nourished, in no apparent distress. Eyes: PERRLA, EOMs, conjunctiva no swelling or erythema Sinuses: No Frontal/maxillary tenderness ENT/Mouth: Ext aud canals clear, TMs without erythema, bulging. No erythema, swelling, or exudate on post pharynx.  Tonsils not swollen or erythematous. Hearing normal.  Neck: Supple, thyroid normal.  Respiratory: Respiratory effort normal, BS equal bilaterally without rales, rhonchi, wheezing or stridor.  Cardio: RRR with no MRGs. Brisk peripheral pulses without edema.  Abdomen: Soft, + BS,  Non tender, no guarding, rebound, hernias, masses. Lymphatics: Non tender without lymphadenopathy.  Musculoskeletal: Full ROM, 5/5 strength, Normal gait Skin: Scaly erythematous patch on right cheek, several sun spots on legs/arms. 17000 Warm, dry without rashes, lesions, ecchymosis.  Neuro: Cranial nerves intact. Normal muscle tone, no cerebellar symptoms. Psych: Awake and oriented X 3, normal affect, Insight and Judgment appropriate.    Quentin Mulling, PA-C 4:03 PM [facility]

## 2014-11-20 LAB — HEPATIC FUNCTION PANEL
ALT: 18 U/L (ref 6–29)
AST: 17 U/L (ref 10–35)
Albumin: 4.3 g/dL (ref 3.6–5.1)
Alkaline Phosphatase: 77 U/L (ref 33–130)
Bilirubin, Direct: 0.1 mg/dL (ref <=0.2)
Indirect Bilirubin: 0.4 mg/dL (ref 0.2–1.2)
Total Bilirubin: 0.5 mg/dL (ref 0.2–1.2)
Total Protein: 6.4 g/dL (ref 6.1–8.1)

## 2014-11-20 LAB — BASIC METABOLIC PANEL WITH GFR
BUN: 14 mg/dL (ref 7–25)
CO2: 26 mmol/L (ref 20–31)
Calcium: 9.8 mg/dL (ref 8.6–10.4)
Chloride: 104 mmol/L (ref 98–110)
Creat: 0.83 mg/dL (ref 0.50–0.99)
GFR, Est African American: 87 mL/min (ref >=60)
GFR, Est Non African American: 76 mL/min (ref >=60)
Glucose, Bld: 87 mg/dL (ref 65–99)
Potassium: 4.3 mmol/L (ref 3.5–5.3)
Sodium: 139 mmol/L (ref 135–146)

## 2014-11-20 LAB — TSH: TSH: 0.758 u[IU]/mL (ref 0.350–4.500)

## 2014-11-20 LAB — MAGNESIUM: Magnesium: 2.1 mg/dL (ref 1.5–2.5)

## 2014-11-20 LAB — VITAMIN D 25 HYDROXY (VIT D DEFICIENCY, FRACTURES): Vit D, 25-Hydroxy: 28 ng/mL — ABNORMAL LOW (ref 30–100)

## 2014-11-21 ENCOUNTER — Encounter: Payer: Self-pay | Admitting: Physician Assistant

## 2014-11-21 LAB — LYME ABY, WSTRN BLT IGG & IGM W/BANDS

## 2014-11-21 LAB — ROCKY MTN SPOTTED FVR ABS PNL(IGG+IGM)
RMSF IgG: 0.2 IV
RMSF IgM: 0.18 IV

## 2014-11-23 MED ORDER — VITAMIN D (ERGOCALCIFEROL) 1.25 MG (50000 UNIT) PO CAPS: ORAL_CAPSULE | ORAL | Status: DC

## 2014-12-09 ENCOUNTER — Other Ambulatory Visit: Payer: Self-pay

## 2014-12-09 DIAGNOSIS — Z1231 Encounter for screening mammogram for malignant neoplasm of breast: Secondary | ICD-10-CM

## 2014-12-22 ENCOUNTER — Other Ambulatory Visit: Payer: Self-pay | Admitting: *Deleted

## 2014-12-22 DIAGNOSIS — F988 Other specified behavioral and emotional disorders with onset usually occurring in childhood and adolescence: Secondary | ICD-10-CM

## 2014-12-22 MED ORDER — AMPHETAMINE-DEXTROAMPHETAMINE 20 MG PO TABS: ORAL_TABLET | ORAL | Status: DC

## 2014-12-31 ENCOUNTER — Ambulatory Visit (INDEPENDENT_AMBULATORY_CARE_PROVIDER_SITE_OTHER): Payer: BLUE CROSS/BLUE SHIELD | Admitting: Physician Assistant

## 2014-12-31 ENCOUNTER — Encounter: Payer: Self-pay | Admitting: Physician Assistant

## 2014-12-31 VITALS — BP 130/66 | HR 71 | Temp 98.2°F | Resp 16 | Ht 62.5 in | Wt 162.0 lb

## 2014-12-31 DIAGNOSIS — Z23 Encounter for immunization: Secondary | ICD-10-CM

## 2014-12-31 DIAGNOSIS — L570 Actinic keratosis: Secondary | ICD-10-CM

## 2014-12-31 DIAGNOSIS — F988 Other specified behavioral and emotional disorders with onset usually occurring in childhood and adolescence: Secondary | ICD-10-CM

## 2014-12-31 DIAGNOSIS — L918 Other hypertrophic disorders of the skin: Secondary | ICD-10-CM | POA: Diagnosis not present

## 2014-12-31 DIAGNOSIS — F909 Attention-deficit hyperactivity disorder, unspecified type: Secondary | ICD-10-CM

## 2014-12-31 DIAGNOSIS — G4733 Obstructive sleep apnea (adult) (pediatric): Secondary | ICD-10-CM | POA: Diagnosis not present

## 2014-12-31 DIAGNOSIS — R350 Frequency of micturition: Secondary | ICD-10-CM

## 2014-12-31 MED ORDER — BUPROPION HCL ER (XL) 150 MG PO TB24: 150.0000 mg | ORAL_TABLET | ORAL | Status: DC

## 2014-12-31 NOTE — Progress Notes (Signed)
Subjective:    Patient ID: Diana Wolfe, female    DOB: October 17, 1950, 64 y.o.   MRN: [id number]  HPI 64 y.o. WF with history of shingles presents with painful blisters on right right scalp/face. Tender, painful, feels like shingles before x 1 week, feeling better and it is drying up. Has not had the shingles vaccine yet.   Has been having urinary frequency, no dysuria, no pressure, lower back pain.   She is on thyroid medication. Her medication was not changed last visit.  She has been having some brain fog, trouble concentrating. She is taking adderall when she is driving. No anxiety/depression.  Lab Results  Component Value Date   TSH 0.758 11/19/2014  .   Blood pressure 130/66, pulse 71, temperature 98.2 F (36.8 C), temperature source Temporal, resp. rate 16, height 5' 2.5" (1.588 m), weight 162 lb (73.483 kg), SpO2 95 %.  Current Outpatient Prescriptions on File Prior to Visit  Medication Sig Dispense Refill  . ALPRAZolam (XANAX) 1 MG tablet 1/2-1 tablet as needed for sleep/anxiety 30 tablet 0  . amphetamine-dextroamphetamine (ADDERALL) 20 MG tablet Take 1/2 to 1 tablet if needed for ADD 30 tablet 0  . atenolol (TENORMIN) 50 MG tablet TAKE 1/2 TO 1 TABLET BY MOUTH EVERY NIGHT AT BEDTIME 90 tablet 2  . carisoprodol (SOMA) 250 MG tablet Take 1 tablet (250 mg total) by mouth at bedtime. 30 tablet 0  . clotrimazole-betamethasone (LOTRISONE) cream Apply 1 application topically 2 (two) times daily. 45 g 2  . conjugated estrogens (PREMARIN) vaginal cream Place 1 Applicatorful vaginally daily. 42.5 g 3  . levothyroxine (SYNTHROID, LEVOTHROID) 125 MCG tablet TAKE 1 TABLET BY MOUTH ON AN EMPTY STOMACH  - DUE FOR 6 MONTH  FOLLOW UP OFFICE VISIT IN OCT 90 tablet 1  . lidocaine (LIDODERM) 5 % Place 1 patch onto the skin daily. Remove & Discard patch within 12 hours or as directed by MD 30 patch 3  . meloxicam (MOBIC) 15 MG tablet Take 1 tablet (15 mg total) by mouth daily. 90 tablet 1  .  ranitidine (ZANTAC) 300 MG tablet Take twice a day while trying to get off PPI, then can go to once at night 60 tablet 3  . traMADol (ULTRAM) 50 MG tablet Take 1 tablet (50 mg total) by mouth every 6 (six) hours as needed. 90 tablet 0  . Vitamin D, Ergocalciferol, (DRISDOL) 50000 UNITS CAPS capsule 1 pill 3 days a week 30 capsule 1   No current facility-administered medications on file prior to visit.   Past Medical History  Diagnosis Date  . Hyperlipidemia   . Hypertension   . Anxiety   . GERD (gastroesophageal reflux disease)   . ADD (attention deficit disorder)   . Depression   . Vitamin D deficiency   . Hypothyroid     Review of Systems  Constitutional: Negative.   HENT: Negative.   Eyes: Negative.   Respiratory: Negative.   Cardiovascular: Negative.   Gastrointestinal: Negative.   Genitourinary: Positive for urgency and frequency. Negative for dysuria, hematuria, flank pain, decreased urine volume, vaginal bleeding, vaginal discharge, enuresis, difficulty urinating, genital sores, vaginal pain, menstrual problem, pelvic pain and dyspareunia.  Musculoskeletal: Negative.   Skin: Positive for rash. Negative for color change, pallor and wound.  Neurological: Negative.  Negative for dizziness.  Psychiatric/Behavioral: Positive for sleep disturbance and decreased concentration. Negative for suicidal ideas, hallucinations, behavioral problems, confusion, self-injury, dysphoric mood and agitation. The patient is not nervous/anxious and  is not hyperactive.        Objective:   Physical Exam  Constitutional: She is oriented to person, place, and time. She appears well-developed and well-nourished.  HENT:  Head: Normocephalic and atraumatic.  Eyes: Conjunctivae are normal. Pupils are equal, round, and reactive to light.  Neck: Normal range of motion. Neck supple.  Cardiovascular: Normal rate and regular rhythm.   Pulmonary/Chest: Effort normal and breath sounds normal.  Abdominal:  Soft. Bowel sounds are normal. There is no tenderness.  Musculoskeletal: Normal range of motion.  Lymphadenopathy:    She has no cervical adenopathy.  Neurological: She is alert and oriented to person, place, and time. She has normal reflexes.  Skin: Skin is warm and dry. Rash (along right scalp linear drying vesicles) noted.  Has several scaly stuck on seb keratosis on legs, left hand      Assessment & Plan:  ADD- add wellbutrin, continue adderall PRN OSA/fatigue- on dental appliance x 8 years, may need adjustment, given numbers Urinary frequency- vaginal atrophy versus UTI versus OAB- check labs, start back on premarin, declines meds at this time, bladder matters.  Shingles- getting better, will get vaccine Seb dermatitis- several on legs and left hand, 3 freeze and thaw technique

## 2014-12-31 NOTE — Patient Instructions (Addendum)
Can call Dr. Toni Arthurs to get evaluated for dental sleep appliance. # [phone]  OR you can try Dr. Reatha Armour in [facility] # [phone]  We will send notes. Let them know you have a mouth piece, and ask if they could potenially adjust it, Marchelle Folks from Dr. Oneta Rack sent.   Shingles Vaccine: What You Need to Know WHAT IS SHINGLES?  Shingles is a painful skin rash, often with blisters. It is also called Herpes Zoster or just Zoster.  A shingles rash usually appears on one side of the face or body and lasts from 2 to 4 weeks. Its main symptom is pain, which can be quite severe. Other symptoms of shingles can include fever, headache, chills, and upset stomach. Very rarely, a shingles infection can lead to pneumonia, hearing problems, blindness, brain inflammation (encephalitis), or death.  For about 1 person in 5, severe pain can continue even after the rash clears up. This is called post-herpetic neuralgia.  Shingles is caused by the Varicella Zoster virus. This is the same virus that causes chickenpox. Only someone who has had a case of chickenpox or rarely, has gotten chickenpox vaccine, can get shingles. The virus stays in your body. It can reappear many years later to cause a case of shingles.  You cannot catch shingles from another person with shingles. However, a person who has never had chickenpox (or chickenpox vaccine) could get chickenpox from someone with shingles. This is not very common.  Shingles is far more common in people 69 and older than in younger people. It is also more common in people whose immune systems are weakened because of a disease such as cancer or drugs such as steroids or chemotherapy.  At least 1 million people get shingles per year in the [place]. SHINGLES VACCINE  A vaccine for shingles was licensed in 2006. In clinical trials, the vaccine reduced the risk of shingles by 50%. It can also reduce the pain in people who still get shingles after  being vaccinated.  A single dose of shingles vaccine is recommended for adults 46 years of age and older. SOME PEOPLE SHOULD NOT GET SHINGLES VACCINE OR SHOULD WAIT A person should not get shingles vaccine if he or she:  Has ever had a life-threatening allergic reaction to gelatin, the antibiotic neomycin, or any other component of shingles vaccine. Tell your caregiver if you have any severe allergies.  Has a weakened immune system because of current:  AIDS or another disease that affects the immune system.  Treatment with drugs that affect the immune system, such as prolonged use of high-dose steroids.  Cancer treatment, such as radiation or chemotherapy.  Cancer affecting the bone marrow or lymphatic system, such as leukemia or lymphoma.  Is pregnant, or might be pregnant. Women should not become pregnant until at least 4 weeks after getting shingles vaccine. Someone with a minor illness, such as a cold, may be vaccinated. Anyone with a moderate or severe acute illness should usually wait until he or she recovers before getting the vaccine. This includes anyone with a temperature of 101.3 F (38 C) or higher. WHAT ARE THE RISKS FROM SHINGLES VACCINE?  A vaccine, like any medicine, could possibly cause serious problems, such as severe allergic reactions. However, the risk of a vaccine causing serious harm, or death, is extremely small.  No serious problems have been identified with shingles vaccine. Mild Problems  Redness, soreness, swelling, or itching at the site of the injection (  about 1 person in 3).  Headache (about 1 person in 70). Like all vaccines, shingles vaccine is being closely monitored for unusual or severe problems. WHAT IF THERE IS A MODERATE OR SEVERE REACTION? What should I look for? Any unusual condition, such as a severe allergic reaction or a high fever. If a severe allergic reaction occurred, it would be within a few minutes to an hour after the shot. Signs of  a serious allergic reaction can include difficulty breathing, weakness, hoarseness or wheezing, a fast heartbeat, hives, dizziness, paleness, or swelling of the throat. What should I do?  Call your caregiver, or get the person to a caregiver right away.  Tell the caregiver what happened, the date and time it happened, and when the vaccination was given.  Ask the caregiver to report the reaction by filing a Vaccine Adverse Event Reporting System (VAERS) form. Or, you can file this report through the VAERS web site at www.vaers.LAgents.no or by calling 1-272 209 1040. VAERS does not provide medical advice. HOW CAN I LEARN MORE?  Ask your caregiver. He or she can give you the vaccine package insert or suggest other sources of information.  Contact the Centers for Disease Control and Prevention (CDC):  Call (503)866-7334 (1-800-CDC-INFO).  Visit the CDC website at PicCapture.uy CDC Shingles Vaccine VIS (11/27/07)   This information is not intended to replace advice given to you by your health care provider. Make sure you discuss any questions you have with your health care provider.   Document Released: 12/05/2005 Document Revised: 06/24/2014 Document Reviewed: 05/30/2012 Elsevier Interactive Patient Education 2016 Else[employer] Inc.   Overactive Bladder, Adult Overactive bladder is a group of urinary symptoms. With overactive bladder, you may suddenly feel the need to pass urine (urinate) right away. After feeling this sudden urge, you might also leak urine if you cannot get to the bathroom fast enough (urinary incontinence). These symptoms might interfere with your daily work or social activities. Overactive bladder symptoms may also wake you up at night. Overactive bladder affects the nerve signals between your bladder and your brain. Your bladder may get the signal to empty before it is full. Very sensitive muscles can also make your bladder squeeze too soon. CAUSES Many things can  cause an overactive bladder. Possible causes include:  Urinary tract infection.  Infection of nearby tissues, such as the prostate.  Prostate enlargement.  Being pregnant with twins or more (multiples).  Surgery on the uterus or urethra.  Bladder stones, inflammation, or tumors.  Drinking too much caffeine or alcohol.  Certain medicines, especially those that you take to help your body get rid of extra fluid (diuretics) by increasing urine production.  Muscle or nerve weakness, especially from:  A spinal cord injury.  Stroke.  Multiple sclerosis.  Parkinson disease.  Diabetes. This can cause a high urine volume that fills the bladder so quickly that the normal urge to urinate is triggered very strongly.  Constipation. A buildup of too much stool can put pressure on your bladder. RISK FACTORS You may be at greater risk for overactive bladder if you:  Are an older adult.  Smoke.  Are going through menopause.  Have prostate problems.  Have a neurological disease, such as stroke, dementia, Parkinson disease, or multiple sclerosis (MS).  Eat or drink things that irritate the bladder. These include alcohol, spicy food, and caffeine.  Are overweight or obese. SIGNS AND SYMPTOMS  The signs and symptoms of an overactive bladder include:  Sudden, strong urges to urinate.  Leaking urine.  Urinating eight or more times per day.  Waking up to urinate two or more times per night. DIAGNOSIS Your health care provider may suspect overactive bladder based on your symptoms. The health care provider will do a physical exam and take your medical history. Blood or urine tests may also be done. For example, you might need to have a bladder function test to check how well you can hold your urine. You might also need to see a health care provider who specializes in the urinary tract (urologist). TREATMENT Treatment for overactive bladder depends on the cause of your condition and  whether it is mild or severe. Certain treatments can be done in your health care provider's office or clinic. You can also make lifestyle changes at home. Options include: Behavioral Treatments  Biofeedback. A specialist uses sensors to help you become aware of your body's signals.  Keeping a daily log of when you need to urinate and what happens after the urge. This may help you manage your condition.  Bladder training. This helps you learn to control the urge to urinate by following a schedule that directs you to urinate at regular intervals (timed voiding). At first, you might have to wait a few minutes after feeling the urge. In time, you should be able to schedule bathroom visits an hour or more apart.  Kegel exercises. These are exercises to strengthen the pelvic floor muscles, which support the bladder. Toning these muscles can help you control urination, even if your bladder muscles are overactive. A specialist will teach you how to do these exercises correctly. They require daily practice.  Weight loss. If you are obese or overweight, losing weight might relieve your symptoms of overactive bladder. Talk to your health care provider about losing weight and whether there is a specific program or method that would work best for you.  Diet change. This might help if constipation is making your overactive bladder worse. Your health care provider or a dietitian can explain ways to change what you eat to ease constipation. You might also need to consume less alcohol and caffeine or drink other fluids at different times of the day.  Stopping smoking.  Wearing pads to absorb leakage while you wait for other treatments to take effect. Physical Treatments  Electrical stimulation. Electrodes send gentle pulses of electricity to strengthen the nerves or muscles that help to control the bladder. Sometimes, the electrodes are placed outside of the body. In other cases, they might be placed inside the  body (implanted). This treatment can take several months to have an effect.  Supportive devices. Women may need a plastic device that fits into the vagina and supports the bladder (pessary). Medicines Several medicines can help treat overactive bladder and are usually used along with other treatments. Some are injected into the muscles involved in urination. Others come in pill form. Your health care provider may prescribe:  Antispasmodics. These medicines block the signals that the nerves send to the bladder. This keeps the bladder from releasing urine at the wrong time.  Tricyclic antidepressants. These types of antidepressants also relax bladder muscles. Surgery  You may have a device implanted to help manage the nerve signals that indicate when you need to urinate.  You may have surgery to implant electrodes for electrical stimulation.  Sometimes, very severe cases of overactive bladder require surgery to change the shape of the bladder. HOME CARE INSTRUCTIONS   Take medicines only as directed by your health care provider.  Use any implants or a pessary as directed by your health care provider.  Make any diet or lifestyle changes that are recommended by your health care provider. These might include:  Drinking less fluid or drinking at different times of the day. If you need to urinate often during the night, you may need to stop drinking fluids early in the evening.  Cutting down on caffeine or alcohol. Both can make an overactive bladder worse. Caffeine is found in coffee, tea, and sodas.  Doing Kegel exercises to strengthen muscles.  Losing weight if you need to.  Eating a healthy and balanced diet to prevent constipation.  Keep a journal or log to track how much and when you drink and also when you feel the need to urinate. This will help your health care provider to monitor your condition. SEEK MEDICAL CARE IF:  Your symptoms do not get better after treatment.  Your  pain and discomfort are getting worse.  You have more frequent urges to urinate.  You have a fever. SEEK IMMEDIATE MEDICAL CARE IF: You are not able to control your bladder at all.   This information is not intended to replace advice given to you by your health care provider. Make sure you discuss any questions you have with your health care provider.   Document Released: 12/04/2008 Document Revised: 02/28/2014 Document Reviewed: 07/03/2013 Elsevier Interactive Patient Education [employer]

## 2015-01-01 LAB — URINE CULTURE: Colony Count: 30000

## 2015-01-01 LAB — URINALYSIS, ROUTINE W REFLEX MICROSCOPIC
Bilirubin Urine: NEGATIVE
Glucose, UA: NEGATIVE
Hgb urine dipstick: NEGATIVE
Ketones, ur: NEGATIVE
Leukocytes, UA: NEGATIVE
Nitrite: NEGATIVE
Protein, ur: NEGATIVE
Specific Gravity, Urine: 1.015 (ref 1.001–1.035)
pH: 5.5 (ref 5.0–8.0)

## 2015-01-03 ENCOUNTER — Encounter: Payer: Self-pay | Admitting: Physician Assistant

## 2015-01-04 MED ORDER — MUPIROCIN CALCIUM 2 % EX CREA: 1.0000 "application " | TOPICAL_CREAM | Freq: Three times a day (TID) | CUTANEOUS | Status: DC

## 2015-01-18 ENCOUNTER — Encounter: Payer: Self-pay | Admitting: Physician Assistant

## 2015-01-26 ENCOUNTER — Other Ambulatory Visit: Payer: Self-pay | Admitting: Physician Assistant

## 2015-01-26 DIAGNOSIS — F988 Other specified behavioral and emotional disorders with onset usually occurring in childhood and adolescence: Secondary | ICD-10-CM

## 2015-01-26 MED ORDER — VITAMIN D (ERGOCALCIFEROL) 1.25 MG (50000 UNIT) PO CAPS: ORAL_CAPSULE | ORAL | Status: DC

## 2015-01-26 MED ORDER — AMPHETAMINE-DEXTROAMPHETAMINE 20 MG PO TABS: ORAL_TABLET | ORAL | Status: DC

## 2015-01-30 ENCOUNTER — Ambulatory Visit
Admission: RE | Admit: 2015-01-30 | Discharge: 2015-01-30 | Disposition: A | Discharge status: Home / Self Care | Payer: BLUE CROSS/BLUE SHIELD | Source: Ambulatory Visit

## 2015-01-30 DIAGNOSIS — Z1231 Encounter for screening mammogram for malignant neoplasm of breast: Secondary | ICD-10-CM

## 2015-02-05 ENCOUNTER — Encounter: Payer: Self-pay | Admitting: Physician Assistant

## 2015-02-05 ENCOUNTER — Other Ambulatory Visit: Payer: Self-pay | Admitting: Physician Assistant

## 2015-02-05 MED ORDER — MECLIZINE HCL 25 MG PO TABS: ORAL_TABLET | ORAL | Status: DC

## 2015-03-27 ENCOUNTER — Ambulatory Visit (INDEPENDENT_AMBULATORY_CARE_PROVIDER_SITE_OTHER): Payer: BLUE CROSS/BLUE SHIELD | Admitting: Internal Medicine

## 2015-03-27 ENCOUNTER — Encounter: Payer: Self-pay | Admitting: Internal Medicine

## 2015-03-27 VITALS — BP 124/78 | HR 68 | Temp 98.0°F | Resp 16 | Ht 62.5 in | Wt 157.0 lb

## 2015-03-27 DIAGNOSIS — J0110 Acute frontal sinusitis, unspecified: Secondary | ICD-10-CM | POA: Diagnosis not present

## 2015-03-27 MED ORDER — METOCLOPRAMIDE HCL 10 MG PO TABS: 10.0000 mg | ORAL_TABLET | Freq: Three times a day (TID) | ORAL | Status: DC

## 2015-03-27 MED ORDER — MOMETASONE FUROATE 50 MCG/ACT NA SUSP: 2.0000 | Freq: Every day | NASAL | Status: DC

## 2015-03-27 MED ORDER — KETOROLAC TROMETHAMINE 30 MG/ML IJ SOLN
30.0000 mg | Freq: Once | INTRAMUSCULAR | Status: AC
  Administered 2015-03-27: 30 mg via INTRAMUSCULAR

## 2015-03-27 NOTE — Patient Instructions (Signed)
Please take nasonex 2 sprays per nostril daily.  Please start taking your antihistamine daily.  Please take reglan as needed for severe headaches.    Ibuprofen, advil, aleve, or naproxen are going to be better options for headache medications than excedrin.  You can take tylenol at the same time you take an antiinflammatory for better coverage and relief.    Please go to the ER if you have weakness, confusion, or any other concerning symptoms.    General Headache Without Cause A headache is pain or discomfort felt around the head or neck area. The specific cause of a headache may not be found. There are many causes and types of headaches. A few common ones are:  Tension headaches.  Migraine headaches.  Cluster headaches.  Chronic daily headaches. HOME CARE INSTRUCTIONS  Watch your condition for any changes. Take these steps to help with your condition: Managing Pain  Take over-the-counter and prescription medicines only as told by your health care provider.  Lie down in a dark, quiet room when you have a headache.  If directed, apply ice to the head and neck area:  Put ice in a plastic bag.  Place a towel between your skin and the bag.  Leave the ice on for 20 minutes, 2-3 times per day.  Use a heating pad or hot shower to apply heat to the head and neck area as told by your health care provider.  Keep lights dim if bright lights bother you or make your headaches worse. Eating and Drinking  Eat meals on a regular schedule.  Limit alcohol use.  Decrease the amount of caffeine you drink, or stop drinking caffeine. General Instructions  Keep all follow-up visits as told by your health care provider. This is important.  Keep a headache journal to help find out what may trigger your headaches. For example, write down:  What you eat and drink.  How much sleep you get.  Any change to your diet or medicines.  Try massage or other relaxation techniques.  Limit  stress.  Sit up straight, and do not tense your muscles.  Do not use tobacco products, including cigarettes, chewing tobacco, or e-cigarettes. If you need help quitting, ask your health care provider.  Exercise regularly as told by your health care provider.  Sleep on a regular schedule. Get 7-9 hours of sleep, or the amount recommended by your health care provider. SEEK MEDICAL CARE IF:   Your symptoms are not helped by medicine.  You have a headache that is different from the usual headache.  You have nausea or you vomit.  You have a fever. SEEK IMMEDIATE MEDICAL CARE IF:   Your headache becomes severe.  You have repeated vomiting.  You have a stiff neck.  You have a loss of vision.  You have problems with speech.  You have pain in the eye or ear.  You have muscular weakness or loss of muscle control.  You lose your balance or have trouble walking.  You feel faint or pass out.  You have confusion.   This information is not intended to replace advice given to you by your health care provider. Make sure you discuss any questions you have with your health care provider.   Document Released: 02/07/2005 Document Revised: 10/29/2014 Document Reviewed: 06/02/2014 Elsevier Interactive Patient Education [employer]

## 2015-03-27 NOTE — Progress Notes (Signed)
   Subjective:    Patient ID: Diana Wolfe, female    DOB: 1950/10/03, 65 y.o.   MRN: [id number]  Headache  Associated symptoms include neck pain and rhinorrhea. Pertinent negatives include no dizziness, fever, nausea, photophobia, sinus pressure, sore throat or vomiting.   Patient presents to the office for evaluation of right sided headaches which has been persistent for the last two weeks, globally the whole right side.  She reports that she has noticed some swelling in the right side of her face.  She reports that she has tried taking excedrin and has also taken some naproxen which didn't help a whole lot.  She reports that she has never had migraines in the past.  No vision changes. She reports that it is slightly debilitating.  She does notice she has been clenching her teeth a lot.  She doesn't think that she has had a fever.  Gradual onset of headache. It is waxing and waning but near constantly.  She does report that it has been worse the last 2-3 days.  She has been dieting, but no other changes.  She does not drink a lot.       Review of Systems  Constitutional: Negative for fever, chills and fatigue.  HENT: Positive for congestion, postnasal drip, rhinorrhea and voice change. Negative for sinus pressure and sore throat.   Eyes: Negative for photophobia and visual disturbance.  Gastrointestinal: Negative for nausea and vomiting.  Musculoskeletal: Positive for neck pain. Negative for neck stiffness.  Neurological: Positive for headaches. Negative for dizziness.       Objective:   Physical Exam  Constitutional: She is oriented to person, place, and time. She appears well-developed and well-nourished. No distress.  HENT:  Head: Normocephalic.  Mouth/Throat: Oropharynx is clear and moist. No oropharyngeal exudate.  Eyes: Conjunctivae are normal. No scleral icterus.  Neck: Normal range of motion. Neck supple. No JVD present. No Brudzinski's sign and no Kernig's sign noted. No  thyromegaly present.  Cardiovascular: Normal rate, regular rhythm, normal heart sounds and intact distal pulses.  Exam reveals no gallop and no friction rub.   No murmur heard. Pulmonary/Chest: Effort normal and breath sounds normal. No respiratory distress. She has no wheezes. She has no rales. She exhibits no tenderness.  Abdominal: Soft. Bowel sounds are normal. She exhibits no distension and no mass. There is no tenderness. There is no rebound and no guarding.  Musculoskeletal: Normal range of motion.  Lymphadenopathy:    She has no cervical adenopathy.  Neurological: She is alert and oriented to person, place, and time.  Skin: Skin is warm and dry. She is not diaphoretic.  Psychiatric: She has a normal mood and affect. Her behavior is normal. Judgment and thought content normal.  Nursing note and vitals reviewed.   Filed Vitals:   03/27/15 1123  BP: 124/78  Pulse: 68  Temp: 98 F (36.7 C)  Resp: 16         Assessment & Plan:    1. Acute frontal sinusitis, recurrence not specified -flonase/nasonex if insurance allow -reglan prn for headache -NSAIDs prn -take daily antihistamine - ketorolac (TORADOL) 30 MG/ML injection 30 mg; Inject 1 mL (30 mg total) into the muscle once.

## 2015-04-16 ENCOUNTER — Encounter: Payer: Self-pay | Admitting: Physician Assistant

## 2015-04-28 ENCOUNTER — Other Ambulatory Visit: Payer: Self-pay | Admitting: Physician Assistant

## 2015-04-29 ENCOUNTER — Other Ambulatory Visit: Payer: Self-pay | Admitting: Physician Assistant

## 2015-04-29 DIAGNOSIS — F988 Other specified behavioral and emotional disorders with onset usually occurring in childhood and adolescence: Secondary | ICD-10-CM

## 2015-04-29 MED ORDER — AMPHETAMINE-DEXTROAMPHETAMINE 20 MG PO TABS: ORAL_TABLET | ORAL | Status: DC

## 2015-05-24 ENCOUNTER — Other Ambulatory Visit: Payer: Self-pay | Admitting: Physician Assistant

## 2015-06-23 ENCOUNTER — Encounter: Payer: Self-pay | Admitting: Physician Assistant

## 2015-06-23 ENCOUNTER — Ambulatory Visit (INDEPENDENT_AMBULATORY_CARE_PROVIDER_SITE_OTHER): Payer: BLUE CROSS/BLUE SHIELD | Admitting: Physician Assistant

## 2015-06-23 VITALS — BP 130/64 | HR 69 | Temp 97.5°F | Resp 16 | Ht 62.0 in | Wt 157.4 lb

## 2015-06-23 DIAGNOSIS — N952 Postmenopausal atrophic vaginitis: Secondary | ICD-10-CM

## 2015-06-23 DIAGNOSIS — I10 Essential (primary) hypertension: Secondary | ICD-10-CM | POA: Diagnosis not present

## 2015-06-23 DIAGNOSIS — F32A Depression, unspecified: Secondary | ICD-10-CM

## 2015-06-23 DIAGNOSIS — D509 Iron deficiency anemia, unspecified: Secondary | ICD-10-CM | POA: Diagnosis not present

## 2015-06-23 DIAGNOSIS — F329 Major depressive disorder, single episode, unspecified: Secondary | ICD-10-CM

## 2015-06-23 DIAGNOSIS — J309 Allergic rhinitis, unspecified: Secondary | ICD-10-CM | POA: Diagnosis not present

## 2015-06-23 DIAGNOSIS — E559 Vitamin D deficiency, unspecified: Secondary | ICD-10-CM

## 2015-06-23 DIAGNOSIS — G4733 Obstructive sleep apnea (adult) (pediatric): Secondary | ICD-10-CM | POA: Diagnosis not present

## 2015-06-23 DIAGNOSIS — E039 Hypothyroidism, unspecified: Secondary | ICD-10-CM | POA: Diagnosis not present

## 2015-06-23 DIAGNOSIS — F988 Other specified behavioral and emotional disorders with onset usually occurring in childhood and adolescence: Secondary | ICD-10-CM

## 2015-06-23 DIAGNOSIS — R6889 Other general symptoms and signs: Secondary | ICD-10-CM | POA: Diagnosis not present

## 2015-06-23 DIAGNOSIS — Z79899 Other long term (current) drug therapy: Secondary | ICD-10-CM

## 2015-06-23 DIAGNOSIS — K219 Gastro-esophageal reflux disease without esophagitis: Secondary | ICD-10-CM | POA: Diagnosis not present

## 2015-06-23 DIAGNOSIS — Z0001 Encounter for general adult medical examination with abnormal findings: Secondary | ICD-10-CM

## 2015-06-23 DIAGNOSIS — Z86718 Personal history of other venous thrombosis and embolism: Secondary | ICD-10-CM

## 2015-06-23 DIAGNOSIS — R7303 Prediabetes: Secondary | ICD-10-CM | POA: Diagnosis not present

## 2015-06-23 DIAGNOSIS — F419 Anxiety disorder, unspecified: Secondary | ICD-10-CM

## 2015-06-23 DIAGNOSIS — B3789 Other sites of candidiasis: Secondary | ICD-10-CM

## 2015-06-23 DIAGNOSIS — Z136 Encounter for screening for cardiovascular disorders: Secondary | ICD-10-CM | POA: Diagnosis not present

## 2015-06-23 DIAGNOSIS — R519 Headache, unspecified: Secondary | ICD-10-CM

## 2015-06-23 DIAGNOSIS — R51 Headache: Secondary | ICD-10-CM

## 2015-06-23 DIAGNOSIS — E785 Hyperlipidemia, unspecified: Secondary | ICD-10-CM

## 2015-06-23 LAB — LIPID PANEL
Cholesterol: 197 mg/dL (ref 125–200)
HDL: 47 mg/dL (ref >=46)
LDL Cholesterol: 113 mg/dL (ref <130)
Total CHOL/HDL Ratio: 4.2 Ratio (ref <=5.0)
Triglycerides: 184 mg/dL — ABNORMAL HIGH (ref <150)
VLDL: 37 mg/dL — ABNORMAL HIGH (ref <30)

## 2015-06-23 LAB — CBC WITH DIFFERENTIAL/PLATELET
Basophils Absolute: 66 cells/uL (ref 0–200)
Basophils Relative: 1 %
Eosinophils Absolute: 132 cells/uL (ref 15–500)
Eosinophils Relative: 2 %
HCT: 46.6 % — ABNORMAL HIGH (ref 35.0–45.0)
Hemoglobin: 15.6 g/dL — ABNORMAL HIGH (ref 11.7–15.5)
Lymphocytes Relative: 33 %
Lymphs Abs: 2178 cells/uL (ref 850–3900)
MCH: 28.8 pg (ref 27.0–33.0)
MCHC: 33.5 g/dL (ref 32.0–36.0)
MCV: 86 fL (ref 80.0–100.0)
MPV: 9.7 fL (ref 7.5–12.5)
Monocytes Absolute: 528 cells/uL (ref 200–950)
Monocytes Relative: 8 %
Neutro Abs: 3696 cells/uL (ref 1500–7800)
Neutrophils Relative %: 56 %
Platelets: 315 10*3/uL (ref 140–400)
RBC: 5.42 MIL/uL — ABNORMAL HIGH (ref 3.80–5.10)
RDW: 14.2 % (ref 11.0–15.0)
WBC: 6.6 10*3/uL (ref 3.8–10.8)

## 2015-06-23 LAB — HEPATIC FUNCTION PANEL
ALT: 19 U/L (ref 6–29)
AST: 17 U/L (ref 10–35)
Albumin: 4.2 g/dL (ref 3.6–5.1)
Alkaline Phosphatase: 76 U/L (ref 33–130)
Bilirubin, Direct: 0.1 mg/dL (ref <=0.2)
Indirect Bilirubin: 0.5 mg/dL (ref 0.2–1.2)
Total Bilirubin: 0.6 mg/dL (ref 0.2–1.2)
Total Protein: 6.1 g/dL (ref 6.1–8.1)

## 2015-06-23 LAB — BASIC METABOLIC PANEL WITH GFR
BUN: 16 mg/dL (ref 7–25)
CO2: 26 mmol/L (ref 20–31)
Calcium: 9.5 mg/dL (ref 8.6–10.4)
Chloride: 102 mmol/L (ref 98–110)
Creat: 0.93 mg/dL (ref 0.50–0.99)
GFR, Est African American: 76 mL/min (ref >=60)
GFR, Est Non African American: 66 mL/min (ref >=60)
Glucose, Bld: 89 mg/dL (ref 65–99)
Potassium: 4.3 mmol/L (ref 3.5–5.3)
Sodium: 137 mmol/L (ref 135–146)

## 2015-06-23 LAB — HEMOGLOBIN A1C
Hgb A1c MFr Bld: 5.5 % (ref <5.7)
Mean Plasma Glucose: 111 mg/dL

## 2015-06-23 LAB — TSH: TSH: 0.48 mIU/L

## 2015-06-23 MED ORDER — FLUCONAZOLE 150 MG PO TABS: 150.0000 mg | ORAL_TABLET | Freq: Every day | ORAL | Status: DC

## 2015-06-23 MED ORDER — NYSTATIN 100000 UNIT/GM EX POWD: CUTANEOUS | Status: DC

## 2015-06-23 MED ORDER — AMPHETAMINE-DEXTROAMPHETAMINE 20 MG PO TABS: ORAL_TABLET | ORAL | Status: DC

## 2015-06-23 NOTE — Patient Instructions (Addendum)
[facility]  7 a.m.-6:30 p.m., Monday 7 a.m.-5 p.m., Tuesday-Friday Schedule an appointment by calling [phone].  Encourage you to get the 3D Mammogram  The 3D Mammogram is much more specific and sensitive to pick up breast cancer. For women with fibrocystic breast or lumpy breast it can be hard to determine if it is cancer or not but the 3D mammogram is able to tell this difference which cuts back on unneeded additional tests or scary call backs.   - over 40% increase in detection of breast cancer - over 40% reduction in false positives.  - fewer call backs - reduced anxiety - improved outcomes - [facility]  Preventive Care for Adults  A healthy lifestyle and preventive care can promote health and wellness. Preventive health guidelines for women include the following key practices.  A routine yearly physical is a good way to check with your health care provider about your health and preventive screening. It is a chance to share any concerns and updates on your health and to receive a thorough exam.  Visit your dentist for a routine exam and preventive care every 6 months. Brush your teeth twice a day and floss once a day. Good oral hygiene prevents tooth decay and gum disease.  The frequency of eye exams is based on your age, health, family medical history, use of contact lenses, and other factors. Follow your health care provider's recommendations for frequency of eye exams.  Eat a healthy diet. Foods like vegetables, fruits, whole grains, low-fat dairy products, and lean protein foods contain the nutrients you need without too many calories. Decrease your intake of foods high in solid fats, added sugars, and salt. Eat the right amount of calories for you.Get information about a proper diet from your health care provider, if necessary.  Regular physical exercise is one of the most important things you can do for your health. Most adults should get  at least 150 minutes of moderate-intensity exercise (any activity that increases your heart rate and causes you to sweat) each week. In addition, most adults need muscle-strengthening exercises on 2 or more days a week.  Maintain a healthy weight. The body mass index (BMI) is a screening tool to identify possible weight problems. It provides an estimate of body fat based on height and weight. Your health care provider can find your BMI and can help you achieve or maintain a healthy weight.For adults 20 years and older:  A BMI below 18.5 is considered underweight.  A BMI of 18.5 to 24.9 is normal.  A BMI of 25 to 29.9 is considered overweight.  A BMI of 30 and above is considered obese.  Maintain normal blood lipids and cholesterol levels by exercising and minimizing your intake of saturated fat. Eat a balanced diet with plenty of fruit and vegetables. Blood tests for lipids and cholesterol should begin at age 20 and be repeated every 5 years. If your lipid or cholesterol levels are high, you are over 50, or you are at high risk for heart disease, you may need your cholesterol levels checked more frequently.Ongoing high lipid and cholesterol levels should be treated with medicines if diet and exercise are not working.  If you smoke, find out from your health care provider how to quit. If you do not use tobacco, do not start.  Lung cancer screening is recommended for adults aged 55-80 years who are at high risk for developing lung cancer because of a history   of smoking. A yearly low-dose CT scan of the lungs is recommended for people who have at least a 30-pack-year history of smoking and are a current smoker or have quit within the past 15 years. A pack year of smoking is smoking an average of 1 pack of cigarettes a day for 1 year (for example: 1 pack a day for 30 years or 2 packs a day for 15 years). Yearly screening should continue until the smoker has stopped smoking for at least 15 years. Yearly  screening should be stopped for people who develop a health problem that would prevent them from having lung cancer treatment.  High blood pressure causes heart disease and increases the risk of stroke. Your blood pressure should be checked at least every 1 to 2 years. Ongoing high blood pressure should be treated with medicines if weight loss and exercise do not work.  If you are 63-79 years old, ask your health care provider if you should take aspirin to prevent strokes.  Diabetes screening involves taking a blood sample to check your fasting blood sugar level. This should be done once every 3 years, after age 63, if you are within normal weight and without risk factors for diabetes. Testing should be considered at a younger age or be carried out more frequently if you are overweight and have at least 1 risk factor for diabetes.  Breast cancer screening is essential preventive care for women. You should practice "breast self-awareness." This means understanding the normal appearance and feel of your breasts and may include breast self-examination. Any changes detected, no matter how small, should be reported to a health care provider. Women in their 20s and 30s should have a clinical breast exam (CBE) by a health care provider as part of a regular health exam every 1 to 3 years. After age 40, women should have a CBE every year. Starting at age 40, women should consider having a mammogram (breast X-ray test) every year. Women who have a family history of breast cancer should talk to their health care provider about genetic screening. Women at a high risk of breast cancer should talk to their health care providers about having an MRI and a mammogram every year.  Breast cancer gene (BRCA)-related cancer risk assessment is recommended for women who have family members with BRCA-related cancers. BRCA-related cancers include breast, ovarian, tubal, and peritoneal cancers. Having family members with these  cancers may be associated with an increased risk for harmful changes (mutations) in the breast cancer genes BRCA1 and BRCA2. Results of the assessment will determine the need for genetic counseling and BRCA1 and BRCA2 testing.  Routine pelvic exams to screen for cancer are no longer recommended for nonpregnant women who are considered low risk for cancer of the pelvic organs (ovaries, uterus, and vagina) and who do not have symptoms. Ask your health care provider if a screening pelvic exam is right for you.  If you have had past treatment for cervical cancer or a condition that could lead to cancer, you need Pap tests and screening for cancer for at least 20 years after your treatment. If Pap tests have been discontinued, your risk factors (such as having a new sexual partner) need to be reassessed to determine if screening should be resumed. Some women have medical problems that increase the chance of getting cervical cancer. In these cases, your health care provider may recommend more frequent screening and Pap tests.  Colorectal cancer can be detected and often prevented. Most   routine colorectal cancer screening begins at the age of 50 years and continues through age 75 years. However, your health care provider may recommend screening at an earlier age if you have risk factors for colon cancer. On a yearly basis, your health care provider may provide home test kits to check for hidden blood in the stool. Use of a small camera at the end of a tube, to directly examine the colon (sigmoidoscopy or colonoscopy), can detect the earliest forms of colorectal cancer. Talk to your health care provider about this at age 50, when routine screening begins. Direct exam of the colon should be repeated every 5-10 years through age 75 years, unless early forms of pre-cancerous polyps or small growths are found.  Hepatitis C blood testing is recommended for all people born from 1945 through 1965 and any individual with  known risks for hepatitis C.  Pra  Osteoporosis is a disease in which the bones lose minerals and strength with aging. This can result in serious bone fractures or breaks. The risk of osteoporosis can be identified using a bone density scan. Women ages 65 years and over and women at risk for fractures or osteoporosis should discuss screening with their health care providers. Ask your health care provider whether you should take a calcium supplement or vitamin D to reduce the rate of osteoporosis.  Menopause can be associated with physical symptoms and risks. Hormone replacement therapy is available to decrease symptoms and risks. You should talk to your health care provider about whether hormone replacement therapy is right for you.  Use sunscreen. Apply sunscreen liberally and repeatedly throughout the day. You should seek shade when your shadow is shorter than you. Protect yourself by wearing long sleeves, pants, a wide-brimmed hat, and sunglasses year round, whenever you are outdoors.  Once a month, do a whole body skin exam, using a mirror to look at the skin on your back. Tell your health care provider of new moles, moles that have irregular borders, moles that are larger than a pencil eraser, or moles that have changed in shape or color.  Stay current with required vaccines (immunizations).  Influenza vaccine. All adults should be immunized every year.  Tetanus, diphtheria, and acellular pertussis (Td, Tdap) vaccine. Pregnant women should receive 1 dose of Tdap vaccine during each pregnancy. The dose should be obtained regardless of the length of time since the last dose. Immunization is preferred during the 27th-36th week of gestation. An adult who has not previously received Tdap or who does not know her vaccine status should receive 1 dose of Tdap. This initial dose should be followed by tetanus and diphtheria toxoids (Td) booster doses every 10 years. Adults with an unknown or incomplete  history of completing a 3-dose immunization series with Td-containing vaccines should begin or complete a primary immunization series including a Tdap dose. Adults should receive a Td booster every 10 years.  Varicella vaccine. An adult without evidence of immunity to varicella should receive 2 doses or a second dose if she has previously received 1 dose. Pregnant females who do not have evidence of immunity should receive the first dose after pregnancy. This first dose should be obtained before leaving the health care facility. The second dose should be obtained 4-8 weeks after the first dose.  Human papillomavirus (HPV) vaccine. Females aged 13-26 years who have not received the vaccine previously should obtain the 3-dose series. The vaccine is not recommended for use in pregnant females. However, pregnancy testing is   not needed before receiving a dose. If a female is found to be pregnant after receiving a dose, no treatment is needed. In that case, the remaining doses should be delayed until after the pregnancy. Immunization is recommended for any person with an immunocompromised condition through the age of 59 years if she did not get any or all doses earlier. During the 3-dose series, the second dose should be obtained 4-8 weeks after the first dose. The third dose should be obtained 24 weeks after the first dose and 16 weeks after the second dose.  Zoster vaccine. One dose is recommended for adults aged 13 years or older unless certain conditions are present.  Measles, mumps, and rubella (MMR) vaccine. Adults born before 12 generally are considered immune to measles and mumps. Adults born in [birth year] or later should have 1 or more doses of MMR vaccine unless there is a contraindication to the vaccine or there is laboratory evidence of immunity to each of the three diseases. A routine second dose of MMR vaccine should be obtained at least 28 days after the first dose for students attending postsecondary  schools, health care workers, or international travelers. People who received inactivated measles vaccine or an unknown type of measles vaccine during 1963-1967 should receive 2 doses of MMR vaccine. People who received inactivated mumps vaccine or an unknown type of mumps vaccine before 1979 and are at high risk for mumps infection should consider immunization with 2 doses of MMR vaccine. For females of childbearing age, rubella immunity should be determined. If there is no evidence of immunity, females who are not pregnant should be vaccinated. If there is no evidence of immunity, females who are pregnant should delay immunization until after pregnancy. Unvaccinated health care workers born before [birth year] who lack laboratory evidence of measles, mumps, or rubella immunity or laboratory confirmation of disease should consider measles and mumps immunization with 2 doses of MMR vaccine or rubella immunization with 1 dose of MMR vaccine.  Pneumococcal 13-valent conjugate (PCV13) vaccine. When indicated, a person who is uncertain of her immunization history and has no record of immunization should receive the PCV13 vaccine. An adult aged 79 years or older who has certain medical conditions and has not been previously immunized should receive 1 dose of PCV13 vaccine. This PCV13 should be followed with a dose of pneumococcal polysaccharide (PPSV23) vaccine. The PPSV23 vaccine dose should be obtained at least 8 weeks after the dose of PCV13 vaccine. An adult aged 79 years or older who has certain medical conditions and previously received 1 or more doses of PPSV23 vaccine should receive 1 dose of PCV13. The PCV13 vaccine dose should be obtained 1 or more years after the last PPSV23 vaccine dose.    Pneumococcal polysaccharide (PPSV23) vaccine. When PCV13 is also indicated, PCV13 should be obtained first. All adults aged 100 years and older should be immunized. An adult younger than age 53 years who has certain medical  conditions should be immunized. Any person who resides in a nursing home or long-term care facility should be immunized. An adult smoker should be immunized. People with an immunocompromised condition and certain other conditions should receive both PCV13 and PPSV23 vaccines. People with human immunodeficiency virus (HIV) infection should be immunized as soon as possible after diagnosis. Immunization during chemotherapy or radiation therapy should be avoided. Routine use of PPSV23 vaccine is not recommended for American Indians, [place]atives, or people younger than 65 years unless there are medical conditions that require PPSV23 vaccine.  When indicated, people who have unknown immunization and have no record of immunization should receive PPSV23 vaccine. One-time revaccination 5 years after the first dose of PPSV23 is recommended for people aged 19-64 years who have chronic kidney failure, nephrotic syndrome, asplenia, or immunocompromised conditions. People who received 1-2 doses of PPSV23 before age 65 years should receive another dose of PPSV23 vaccine at age 65 years or later if at least 5 years have passed since the previous dose. Doses of PPSV23 are not needed for people immunized with PPSV23 at or after age 65 years.  Preventive Services / Frequency   Ages 40 to 64 years  Blood pressure check.  Lipid and cholesterol check.  Lung cancer screening. / Every year if you are aged 55-80 years and have a 30-pack-year history of smoking and currently smoke or have quit within the past 15 years. Yearly screening is stopped once you have quit smoking for at least 15 years or develop a health problem that would prevent you from having lung cancer treatment.  Clinical breast exam.** / Every year after age 40 years.  BRCA-related cancer risk assessment.** / For women who have family members with a BRCA-related cancer (breast, ovarian, tubal, or peritoneal cancers).  Mammogram.** / Every year beginning  at age 40 years and continuing for as long as you are in good health. Consult with your health care provider.  Pap test.** / Every 3 years starting at age 30 years through age 65 or 70 years with a history of 3 consecutive normal Pap tests.  HPV screening.** / Every 3 years from ages 30 years through ages 65 to 70 years with a history of 3 consecutive normal Pap tests.  Fecal occult blood test (FOBT) of stool. / Every year beginning at age 50 years and continuing until age 75 years. You may not need to do this test if you get a colonoscopy every 10 years.  Flexible sigmoidoscopy or colonoscopy.** / Every 5 years for a flexible sigmoidoscopy or every 10 years for a colonoscopy beginning at age 50 years and continuing until age 75 years.  Hepatitis C blood test.** / For all people born from 1945 through 1965 and any individual with known risks for hepatitis C.  Skin self-exam. / Monthly.  Influenza vaccine. / Every year.  Tetanus, diphtheria, and acellular pertussis (Tdap/Td) vaccine.** / Consult your health care provider. Pregnant women should receive 1 dose of Tdap vaccine during each pregnancy. 1 dose of Td every 10 years.  Varicella vaccine.** / Consult your health care provider. Pregnant females who do not have evidence of immunity should receive the first dose after pregnancy.  Zoster vaccine.** / 1 dose for adults aged 60 years or older.  Pneumococcal 13-valent conjugate (PCV13) vaccine.** / Consult your health care provider.  Pneumococcal polysaccharide (PPSV23) vaccine.** / 1 to 2 doses if you smoke cigarettes or if you have certain conditions.  Meningococcal vaccine.** / Consult your health care provider.  Hepatitis A vaccine.** / Consult your health care provider.  Hepatitis B vaccine.** / Consult your health care provider. Screening for abdominal aortic aneurysm (AAA)  by ultrasound is recommended for people over 50 who have history of high blood pressure or who are current  or former smokers.   

## 2015-06-23 NOTE — Progress Notes (Signed)
Complete Physical  Assessment and Plan: 1. Essential hypertension - continue medications, DASH diet, exercise and monitor at home. Call if greater than 130/80.  Cut atenolol in half due to fatigue/sinus brady, monitor BP/HR, Go to the ER if any CP, SOB, nausea, dizziness, severe HA, changes vision/speech - CBC with Differential/Platelet - BASIC METABOLIC PANEL WITH GFR - Hepatic function panel - Urinalysis, Routine w reflex microscopic - Microalbumin / creatinine urine ratio - EKG 12-Lead  2. Prediabetes Discussed general issues about diabetes pathophysiology and management., Educational material distributed., Suggested low cholesterol diet., Encouraged aerobic exercise., Discussed foot care., Reminded to get yearly retinal exam. - Hemoglobin A1c - Insulin, fasting  3. Hypothyroidism, unspecified hypothyroidism type Hypothyroidism-check TSH level, continue medications the same, reminded to take on an empty stomach 30-29mins before food.  - TSH  4. Hyperlipidemia -continue medications, check lipids, decrease fatty foods, increase activity. - Lipid panel  5. Vitamin D deficiency - Vit D  25 hydroxy (rtn osteoporosis monitoring)  6. Medication management - Magnesium  7. Obstructive sleep apnea Has been 5 years since mouth piece, getting up 3-4 times a night with increased fatigue, suggest follow up with dental sleep doctor for evaluation, given numbers  8. Gastroesophageal reflux disease without esophagitis GERD- will try to get off PPiI given info for taper and zantac sent in  9. Allergic rhinitis, unspecified allergic rhinitis type Continue OTC allergy pills  10. Nonintractable headache, unspecified chronicity pattern, unspecified headache type Check labs, normal neuro  11. Vaginal atrophy Continue premarin cream  12. Iron deficiency anemia Check CBC  13. PULMONARY EMBOLISM, HX OF S/p fracture, monitor  14. Depression remission  15. Anxiety Declines meds at  this time, continue xanax PRN  16. ADD (attention deficit disorder) - amphetamine-dextroamphetamine (ADDERALL) 20 MG tablet; Take 1/2 to 1 tablet if needed for ADD  Dispense: 30 tablet; Refill: 0  17. OAB (overactive bladder) Declines meds at this time, bladder matters given   Discussed med's effects and SE's. Screening labs and tests as requested with regular follow-up as recommended.  HPI 65 y.o. female  presents for a complete physical. Her blood pressure has been controlled at home, today their BP is BP: 130/64 mmHg She does not workout but she walks a lot for her job. She denies chest pain, shortness of breath, dizziness.  She is not on cholesterol medication and denies myalgias. Her cholesterol is at goal. The cholesterol last visit was:   Lab Results  Component Value Date   CHOL 185 06/23/2014   HDL 43* 06/23/2014   LDLCALC 95 06/23/2014   LDLDIRECT 138.2 04/25/2008   TRIG 234* 06/23/2014   CHOLHDL 4.3 06/23/2014   She has been working on diet and exercise for prediabetes, and denies paresthesia of the feet, polydipsia and polyuria. Last A1C in the office was: Lab Results  Component Value Date   HGBA1C 5.6 06/23/2014  Patient is on Vitamin D supplement, 10,000.   Lab Results  Component Value Date   VD25OH 28* 11/19/2014  She is on the adderall, 1/2 tablet 1-2 x a day mainly when she drives. She has urinary frequency during the day as well, declines incontinence, does have urgency. She does have vaginal atrophy and does premarin 2 x a week.  She has chronic joint pain bilateral feet and left knee, was on NSAIDS but decreased with last GFR being 75.  She is on thyroid medication. Her medication was not changed last visit. Patient denies heat / cold intolerance, nervousness and palpitations.  Lab Results  Component Value Date   TSH 0.758 11/19/2014  BMI is Body mass index is 28.78 kg/(m^2)., she is working on diet and exercise. Wt Readings from Last 3 Encounters:   06/23/15 157 lb 6.4 oz (71.396 kg)  03/27/15 157 lb (71.215 kg)  12/31/14 162 lb (73.483 kg)      Current Medications:  Current Outpatient Prescriptions on File Prior to Visit  Medication Sig Dispense Refill  . ALPRAZolam (XANAX) 1 MG tablet 1/2-1 tablet as needed for sleep/anxiety 30 tablet 0  . amphetamine-dextroamphetamine (ADDERALL) 20 MG tablet Take 1/2 to 1 tablet if needed for ADD 30 tablet 0  . atenolol (TENORMIN) 50 MG tablet TAKE 1/2 TO 1 TABLET BY MOUTH EVERY NIGHT AT BEDTIME 90 tablet 2  . carisoprodol (SOMA) 250 MG tablet Take 1 tablet (250 mg total) by mouth at bedtime. 30 tablet 0  . clotrimazole-betamethasone (LOTRISONE) cream Apply 1 application topically 2 (two) times daily. 45 g 2  . conjugated estrogens (PREMARIN) vaginal cream Place 1 Applicatorful vaginally daily. 42.5 g 3  . levothyroxine (SYNTHROID, LEVOTHROID) 125 MCG tablet TAKE 1 TABLET BY MOUTH ON AN EMPTY STOMACH  - DUE FOR 6 MONTH  FOLLOWUP OFFICE VISIT IN OCT 90 tablet 1  . lidocaine (LIDODERM) 5 % Place 1 patch onto the skin daily. Remove & Discard patch within 12 hours or as directed by MD 30 patch 3  . meclizine (ANTIVERT) 25 MG tablet 1/2-1 pill up to 3 times daily for motion sickness/dizziness 30 tablet 0  . meloxicam (MOBIC) 15 MG tablet Take 1 tablet (15 mg total) by mouth daily. 90 tablet 1  . metoCLOPramide (REGLAN) 10 MG tablet Take 1 tablet (10 mg total) by mouth 3 (three) times daily with meals. 90 tablet 1  . ranitidine (ZANTAC) 300 MG tablet TAKE 1 TABLET BY MOUTH TWICE A DAY WHILE TRYING TO GET OFF OF PPI, THEN CAN USE 1 TABLET AT NIGHT. 60 tablet 2  . Vitamin D, Ergocalciferol, (DRISDOL) 50000 UNITS CAPS capsule 1 pill 3 days a week 30 capsule 1   No current facility-administered medications on file prior to visit.   Health Maintenance:   Immunization History  Administered Date(s) Administered  . Influenza Whole 11/22/2007  . Td 04/25/2008  . Varicella Zoster Immune Globulin 12/31/2014    Tetanus: 2010 Pneumovax: declines Prevnar 13: N/A Flu vaccine: 2016 Zostavax: 2016 Pap  Dr. Comer LocketLavouei 03/2012 due 2019, never abnormal MGM: 01/2015 DEXA: 2007- would like to wait Colonoscopy: 07/2013 Dr. Ewing SchleinMagod, due 5 years EGD: N/A US AB 2009 CT neck 2007 Eye doctor: May 2016 [facility] Dentist: Dr. Denyse AmassWillson in [place], last year  Allergies:  Allergies  Allergen Reactions  . Cefprozil   . Codeine     REACTION: vomiting  . Hydrocodone-Acetaminophen   . Levofloxacin   . Morphine   . Pravastatin Swelling   Medical History:  Past Medical History  Diagnosis Date  . Hyperlipidemia   . Hypertension   . Anxiety   . GERD (gastroesophageal reflux disease)   . ADD (attention deficit disorder)   . Depression   . Vitamin D deficiency   . Hypothyroid    Surgical History:  Past Surgical History  Procedure Laterality Date  . Foot surgery Right   . Cesarean section    . Tonsillectomy  2002   Family History:  Family History  Problem Relation Age of Onset  . Heart disease Father   . Hypertension Father    Social History:  Social History  Substance Use Topics  . Smoking status: Never Smoker   . Smokeless tobacco: None  . Alcohol Use: Yes     Comment: occasional   Review of Systems  Constitutional: Positive for malaise/fatigue. Negative for fever, chills, weight loss and diaphoresis.  HENT: Negative.   Eyes: Negative.   Respiratory: Negative.   Cardiovascular: Negative for chest pain, palpitations, orthopnea, claudication, leg swelling and PND.  Gastrointestinal: Positive for heartburn. Negative for nausea, vomiting, abdominal pain, diarrhea, constipation, blood in stool and melena.  Genitourinary: Positive for frequency. Negative for dysuria, urgency, hematuria and flank pain.       Vaginal dryness  Musculoskeletal: Negative for myalgias, back pain, joint pain, falls and neck pain.  Skin: Negative for itching and rash.  Neurological: Negative.  Negative for dizziness  and weakness.  Psychiatric/Behavioral: Negative for depression, suicidal ideas, hallucinations, memory loss and substance abuse. The patient is nervous/anxious. The patient does not have insomnia.     Physical Exam: Estimated body mass index is 28.78 kg/(m^2) as calculated from the following:   Height as of this encounter:  (1.575 m).   Weight as of this encounter: 157 lb 6.4 oz (71.396 kg). BP 130/64 mmHg  Pulse 69  Temp(Src) 97.5 F (36.4 C) (Temporal)  Resp 16  Ht  (1.575 m)  Wt 157 lb 6.4 oz (71.396 kg)  BMI 28.78 kg/m2  SpO2 96% General Appearance: Well nourished, in no apparent distress. Eyes: PERRLA, EOMs, conjunctiva no swelling or erythema, normal fundi and vessels. Sinuses: No Frontal/maxillary tenderness ENT/Mouth: Ext aud canals clear, normal light reflex with TMs without erythema, bulging.  Good dentition. No erythema, swelling, or exudate on post pharynx. Tonsils not swollen or erythematous. Hearing normal.  Neck: Supple, thyroid normal. No bruits Respiratory: Respiratory effort normal, BS equal bilaterally without rales, rhonchi, wheezing or stridor. Cardio: RRR without murmurs, rubs or gallops. Brisk peripheral pulses without edema.  Chest: symmetric, with normal excursions and percussion. Breasts: defer Abdomen: Soft, +BS. Non tender, no guarding, rebound, hernias, masses, or organomegaly. .  Lymphatics: Non tender without lymphadenopathy.  Genitourinary: defer Musculoskeletal: Full ROM all peripheral extremities,5/5 strength, and normal gait. Skin: right posterior calf presents with non erythematous scaly macule  No lesions, ecchymosis.  Neuro: Cranial nerves intact, reflexes equal bilaterally. Normal muscle tone, no cerebellar symptoms. Sensation intact.  Psych: Awake and oriented X 3, normal affect, Insight and Judgment appropriate.   EKG: no ST changes Aorta Scan Normal    Quentin Mulling 10:18 AM

## 2015-06-24 LAB — URINALYSIS, ROUTINE W REFLEX MICROSCOPIC
Bilirubin Urine: NEGATIVE
Glucose, UA: NEGATIVE
Hgb urine dipstick: NEGATIVE
Ketones, ur: NEGATIVE
Leukocytes, UA: NEGATIVE
Nitrite: NEGATIVE
Protein, ur: NEGATIVE
Specific Gravity, Urine: 1.018 (ref 1.001–1.035)
pH: 6 (ref 5.0–8.0)

## 2015-06-24 LAB — MICROALBUMIN / CREATININE URINE RATIO
Creatinine, Urine: 126 mg/dL (ref 20–320)
Microalb Creat Ratio: 2 mcg/mg creat (ref <30)
Microalb, Ur: 0.3 mg/dL

## 2015-09-02 ENCOUNTER — Ambulatory Visit (HOSPITAL_COMMUNITY): Payer: BLUE CROSS/BLUE SHIELD | Admitting: Certified Registered Nurse Anesthetist

## 2015-09-02 ENCOUNTER — Ambulatory Visit (HOSPITAL_COMMUNITY)
Admission: RE | Admit: 2015-09-02 | Discharge: 2015-09-02 | Disposition: A | Discharge status: Home / Self Care | Payer: BLUE CROSS/BLUE SHIELD | Source: Ambulatory Visit | Attending: Gastroenterology | Admitting: Gastroenterology

## 2015-09-02 ENCOUNTER — Encounter (HOSPITAL_COMMUNITY)
Admission: RE | Disposition: A | Discharge status: Home / Self Care | Payer: Self-pay | Source: Ambulatory Visit | Attending: Gastroenterology

## 2015-09-02 ENCOUNTER — Encounter (HOSPITAL_COMMUNITY): Payer: Self-pay | Admitting: Certified Registered Nurse Anesthetist

## 2015-09-02 DIAGNOSIS — G4733 Obstructive sleep apnea (adult) (pediatric): Secondary | ICD-10-CM | POA: Diagnosis not present

## 2015-09-02 DIAGNOSIS — R1310 Dysphagia, unspecified: Secondary | ICD-10-CM | POA: Insufficient documentation

## 2015-09-02 DIAGNOSIS — E785 Hyperlipidemia, unspecified: Secondary | ICD-10-CM | POA: Diagnosis not present

## 2015-09-02 DIAGNOSIS — N952 Postmenopausal atrophic vaginitis: Secondary | ICD-10-CM | POA: Insufficient documentation

## 2015-09-02 DIAGNOSIS — T18108A Unspecified foreign body in esophagus causing other injury, initial encounter: Secondary | ICD-10-CM | POA: Insufficient documentation

## 2015-09-02 DIAGNOSIS — E039 Hypothyroidism, unspecified: Secondary | ICD-10-CM | POA: Insufficient documentation

## 2015-09-02 DIAGNOSIS — R7303 Prediabetes: Secondary | ICD-10-CM | POA: Insufficient documentation

## 2015-09-02 DIAGNOSIS — E559 Vitamin D deficiency, unspecified: Secondary | ICD-10-CM | POA: Diagnosis not present

## 2015-09-02 DIAGNOSIS — F419 Anxiety disorder, unspecified: Secondary | ICD-10-CM | POA: Diagnosis not present

## 2015-09-02 DIAGNOSIS — F988 Other specified behavioral and emotional disorders with onset usually occurring in childhood and adolescence: Secondary | ICD-10-CM

## 2015-09-02 DIAGNOSIS — Z86711 Personal history of pulmonary embolism: Secondary | ICD-10-CM | POA: Insufficient documentation

## 2015-09-02 DIAGNOSIS — I10 Essential (primary) hypertension: Secondary | ICD-10-CM | POA: Diagnosis not present

## 2015-09-02 DIAGNOSIS — K219 Gastro-esophageal reflux disease without esophagitis: Secondary | ICD-10-CM | POA: Diagnosis not present

## 2015-09-02 DIAGNOSIS — Z791 Long term (current) use of non-steroidal anti-inflammatories (NSAID): Secondary | ICD-10-CM | POA: Diagnosis not present

## 2015-09-02 DIAGNOSIS — X58XXXA Exposure to other specified factors, initial encounter: Secondary | ICD-10-CM | POA: Insufficient documentation

## 2015-09-02 DIAGNOSIS — Z79899 Other long term (current) drug therapy: Secondary | ICD-10-CM | POA: Diagnosis not present

## 2015-09-02 DIAGNOSIS — Z79890 Hormone replacement therapy (postmenopausal): Secondary | ICD-10-CM | POA: Insufficient documentation

## 2015-09-02 HISTORY — DX: Nausea with vomiting, unspecified: R11.2

## 2015-09-02 HISTORY — PX: ESOPHAGOGASTRODUODENOSCOPY: SHX5428

## 2015-09-02 HISTORY — DX: Other specified postprocedural states: Z98.890

## 2015-09-02 SURGERY — EGD (ESOPHAGOGASTRODUODENOSCOPY)
Anesthesia: Monitor Anesthesia Care

## 2015-09-02 SURGERY — EGD (ESOPHAGOGASTRODUODENOSCOPY)
Anesthesia: General

## 2015-09-02 MED ORDER — SODIUM CHLORIDE 0.9 % IV SOLN: INTRAVENOUS | Status: DC

## 2015-09-02 MED ORDER — LACTATED RINGERS IV SOLN
INTRAVENOUS | Status: DC
  Administered 2015-09-02: 1000 mL via INTRAVENOUS
  Administered 2015-09-02: 14:00:00 via INTRAVENOUS

## 2015-09-02 MED ORDER — PROPOFOL 10 MG/ML IV BOLUS
INTRAVENOUS | Status: DC | PRN
  Administered 2015-09-02: 120 mg via INTRAVENOUS

## 2015-09-02 MED ORDER — HYDROMORPHONE HCL 1 MG/ML IJ SOLN: 0.2500 mg | INTRAMUSCULAR | Status: DC | PRN

## 2015-09-02 MED ORDER — FENTANYL CITRATE (PF) 250 MCG/5ML IJ SOLN
INTRAMUSCULAR | Status: AC
  Filled 2015-09-02: qty 5

## 2015-09-02 MED ORDER — MIDAZOLAM HCL 2 MG/2ML IJ SOLN
INTRAMUSCULAR | Status: AC
  Filled 2015-09-02: qty 2

## 2015-09-02 MED ORDER — ONDANSETRON HCL 4 MG/2ML IJ SOLN
4.0000 mg | Freq: Once | INTRAMUSCULAR | Status: AC | PRN
  Administered 2015-09-02: 4 mg via INTRAVENOUS

## 2015-09-02 MED ORDER — MIDAZOLAM HCL 5 MG/5ML IJ SOLN
INTRAMUSCULAR | Status: DC | PRN
  Administered 2015-09-02: 2 mg via INTRAVENOUS

## 2015-09-02 MED ORDER — MEPERIDINE HCL 100 MG/ML IJ SOLN: 6.2500 mg | INTRAMUSCULAR | Status: DC | PRN

## 2015-09-02 MED ORDER — FENTANYL CITRATE (PF) 100 MCG/2ML IJ SOLN
INTRAMUSCULAR | Status: DC | PRN
  Administered 2015-09-02: 125 ug via INTRAVENOUS

## 2015-09-02 MED ORDER — LIDOCAINE HCL (CARDIAC) 20 MG/ML IV SOLN
INTRAVENOUS | Status: DC | PRN
  Administered 2015-09-02: 100 mg via INTRAVENOUS

## 2015-09-02 MED ORDER — AMPHETAMINE-DEXTROAMPHETAMINE 20 MG PO TABS: ORAL_TABLET | ORAL | Status: DC

## 2015-09-02 NOTE — Discharge Instructions (Addendum)
Esophagogastroduodenoscopy, Care After Refer to this sheet in the next few weeks. These instructions provide you with information about caring for yourself after your procedure. Your health care provider may also give you more specific instructions. Your treatment has been planned according to current medical practices, but problems sometimes occur. Call your health care provider if you have any problems or questions after your procedure. WHAT TO EXPECT AFTER THE PROCEDURE After your procedure, it is typical to feel:  Soreness in your throat.  Pain with swallowing.  Sick to your stomach (nauseous).  Bloated.  Dizzy.  Fatigued. HOME CARE INSTRUCTIONS  Do not eat or drink anything until the numbing medicine (local anesthetic) has worn off and your gag reflex has returned. You will know that the local anesthetic has worn off when you can swallow comfortably.  Do not drive or operate machinery until directed by your health care provider.  Take medicines only as directed by your health care provider. SEEK MEDICAL CARE IF:   You cannot stop coughing.  You are not urinating at all or less than usual. SEEK IMMEDIATE MEDICAL CARE IF:  You have difficulty swallowing.  You cannot eat or drink.  You have worsening throat or chest pain.  You have dizziness or lightheadedness or you faint.  You have nausea or vomiting.  You have chills.  You have a fever.  You have severe abdominal pain.  You have black, tarry, or bloody stools.   This information is not intended to replace advice given to you by your health care provider. Make sure you discuss any questions you have with your health care provider.   Document Released: 01/25/2012 Document Revised: 02/28/2014 Document Reviewed: 01/25/2012 Elsevier Interactive Patient Education 2016 Else[employer] Inc[employer]  General Anesthesia, Adult, Care After Refer to this sheet in the next few weeks. These instructions provide you with information on  caring for yourself after your procedure. Your health care provider may also give you more specific instructions. Your treatment has been planned according to current medical practices, but problems sometimes occur. Call your health care provider if you have any problems or questions after your procedure. WHAT TO EXPECT AFTER THE PROCEDURE After the procedure, it is typical to experience:  Sleepiness.  Nausea and vomiting. HOME CARE INSTRUCTIONS  For the first 24 hours after general anesthesia:  Have a responsible person with you.  Do not drive a car. If you are alone, do not take public transportation.  Do not drink alcohol.  Do not take medicine that has not been prescribed by your health care provider.  Do not sign important papers or make important decisions.  You may resume a normal diet and activities as directed by your health care provider.  Change bandages (dressings) as directed.  If you have questions or problems that seem related to general anesthesia, call the hospital and ask for the anesthetist or anesthesiologist on call. SEEK MEDICAL CARE IF:  You have nausea and vomiting that continue the day after anesthesia.  You develop a rash. SEEK IMMEDIATE MEDICAL CARE IF:   You have difficulty breathing.  You have chest pain.  You have any allergic problems.   This information is not intended to replace advice given to you by your health care provider. Make sure you discuss any questions you have with your health care provider.   Document Released: 05/16/2000 Document Revised: 02/28/2014 Document Reviewed: 06/08/2011 Elsevier Interactive Patient Education [employer] Inc[employer]

## 2015-09-02 NOTE — Op Note (Signed)
[facility] Patient Name: Diana Wolfe Procedure Date : 09/02/2015 MRN: [id number] Attending MD: Tresea Mall Dr., MD Date of Birth: 1950/03/26 CSN: [id number] Age: 65 Admit Type: Outpatient Procedure:                Upper GI endoscopy Indications:              Dysphagia, Foreign body in the esophagus,has been                            unable to swallow ever since eating [place] food                            yesterday. In view of likelihood of food impaction                            patient was intimated by anesthesia prior to EGD Providers:                Fayrene Fearing L. Rhandi Despain Dr., MD, Will Bonnet RN, RN,                            Arlee Muslim, Technician, Levora Angel, CRNA Referring MD:              Medicines:                Propofol total dose 120 mg IV Complications:            No immediate complications. Estimated Blood Loss:     Estimated blood loss: none. Procedure:                Pre-Anesthesia Assessment:                           - Prior to the procedure, a History and Physical                            was performed, and patient medications and                            allergies were reviewed. The patient's tolerance of                            previous anesthesia was also reviewed. The risks                            and benefits of the procedure and the sedation                            options and risks were discussed with the patient.                            All questions were answered, and informed consent                            was obtained. Prior Anticoagulants: The patient has  taken no previous anticoagulant or antiplatelet                            agents. ASA Grade Assessment: II - A patient with                            mild systemic disease. After reviewing the risks                            and benefits, the patient was deemed in                            satisfactory condition to  undergo the procedure.                           After obtaining informed consent, the endoscope was                            passed under direct vision. Throughout the                            procedure, the patient's blood pressure, pulse, and                            oxygen saturations were monitored continuously. The                            [id number] ([id number] scope was introduced through the                            mouth, and advanced to the second part of duodenum.                            The upper GI endoscopy was accomplished without                            difficulty. The patient tolerated the procedure                            well. Scope In: Scope Out: Findings:      The examined esophagus was normal. there was no sign of any retained       food at this time. Minimal narrowing at the distal esophagus      The entire examined stomach was normal.      The examined duodenum was normal. Impression:               - Normal esophagus. patient has been unable to                            swallow since yesterday and suspect that the                            impaction that she had recently passed.                           -  Normal stomach.                           - Normal examined duodenum.                           - No specimens collected. Moderate Sedation:      MAC by anesthesia Recommendation:           - Patient has a contact number available for                            emergencies. The signs and symptoms of potential                            delayed complications were discussed with the                            patient. Return to normal activities tomorrow.                            Written discharge instructions were provided to the                            patient.                           - Resume previous diet.                           - Continue present medications.                           - Return to GI clinic in 1  month. Procedure Code(s):        --- Professional ---                           [phone], Esophagogastroduodenoscopy, flexible,                            transoral; diagnostic, including collection of                            specimen(s) by brushing or washing, when performed                            (separate procedure) Diagnosis Code(s):        --- Professional ---                           R13.10, Dysphagia, unspecified                           T18.108A, Unspecified foreign body in esophagus                            causing other injury, initial encounter CPT copyright 2016 American Medical Association. All rights reserved. The codes  documented in this report are preliminary and upon coder review may  be revised to meet current compliance requirements. Tresea Mall Dr., MD 09/02/2015 2:51:21 PM This report has been signed electronically. Number of Addenda: 0

## 2015-09-02 NOTE — Transfer of Care (Signed)
Immediate Anesthesia Transfer of Care Note  Patient: Diana Wolfe  Procedure(s) Performed: Procedure(s): ESOPHAGOGASTRODUODENOSCOPY (EGD) (N/A)  Patient Location: PACU  Anesthesia Type:General  Level of Consciousness: awake, alert , oriented and patient cooperative  Airway & Oxygen Therapy: Patient Spontanous Breathing  Post-op Assessment: Report given to RN and Post -op Vital signs reviewed and stable  Post vital signs: Reviewed and stable  Last Vitals:  Filed Vitals:   09/02/15 1331  BP: 148/58  Pulse: 75  Temp: 36.7 C  Resp: 19    Last Pain:  Filed Vitals:   09/02/15 1334  PainSc: 3          Complications: No apparent anesthesia complications

## 2015-09-02 NOTE — Anesthesia Procedure Notes (Signed)
Procedure Name: Intubation Date/Time: 09/02/2015 2:30 PM Performed by: Roney MansSMITH, Vernita Tague P Pre-anesthesia Checklist: Patient identified, Emergency Drugs available, Suction available, Patient being monitored and Timeout performed Patient Re-evaluated:Patient Re-evaluated prior to inductionOxygen Delivery Method: Circle system utilized Preoxygenation: Pre-oxygenation with 100% oxygen Intubation Type: IV induction, Rapid sequence and Cricoid Pressure applied Laryngoscope Size: Mac and 3 Grade View: Grade I Tube type: Oral Tube size: 7.0 mm Number of attempts: 1 Airway Equipment and Method: Stylet Placement Confirmation: ETT inserted through vocal cords under direct vision,  positive ETCO2 and breath sounds checked- equal and bilateral Secured at: 23 cm Tube secured with: Tape Dental Injury: Teeth and Oropharynx as per pre-operative assessment

## 2015-09-02 NOTE — Anesthesia Preprocedure Evaluation (Signed)
Anesthesia Evaluation  Patient identified by MRN, date of birth, ID band Patient awake    Reviewed: Allergy & Precautions, NPO status , Patient's Chart, lab work & pertinent test results  History of Anesthesia Complications (+) PONV  Airway Mallampati: I  TM Distance: >3 FB Neck ROM: Full    Dental   Pulmonary sleep apnea ,    Pulmonary exam normal        Cardiovascular hypertension, Pt. on medications Normal cardiovascular exam     Neuro/Psych Anxiety Depression    GI/Hepatic GERD  Medicated and Controlled,  Endo/Other  Hypothyroidism   Renal/GU      Musculoskeletal   Abdominal   Peds  Hematology  (+) anemia ,   Anesthesia Other Findings   Reproductive/Obstetrics                             Anesthesia Physical Anesthesia Plan  ASA: II  Anesthesia Plan: General   Post-op Pain Management:    Induction: Intravenous  Airway Management Planned: Oral ETT  Additional Equipment:   Intra-op Plan:   Post-operative Plan: Extubation in OR  Informed Consent: I have reviewed the patients History and Physical, chart, labs and discussed the procedure including the risks, benefits and alternatives for the proposed anesthesia with the patient or authorized representative who has indicated his/her understanding and acceptance.     Plan Discussed with: CRNA and Surgeon  Anesthesia Plan Comments:         Anesthesia Quick Evaluation

## 2015-09-02 NOTE — Anesthesia Postprocedure Evaluation (Signed)
Anesthesia Post Note  Patient: Diana Wolfe  Procedure(s) Performed: Procedure(s) (LRB): ESOPHAGOGASTRODUODENOSCOPY (EGD) (N/A)  Patient location during evaluation: PACU Anesthesia Type: General Level of consciousness: awake and alert Pain management: pain level controlled Vital Signs Assessment: post-procedure vital signs reviewed and stable Respiratory status: spontaneous breathing, nonlabored ventilation, respiratory function stable and patient connected to nasal cannula oxygen Cardiovascular status: blood pressure returned to baseline and stable Postop Assessment: no signs of nausea or vomiting Anesthetic complications: no    Last Vitals:  Filed Vitals:   09/02/15 1520 09/02/15 1530  BP: 114/46 109/50  Pulse: 75 72  Temp:    Resp: 19 19    Last Pain:  Filed Vitals:   09/02/15 1530  PainSc: 3                  [name]

## 2015-09-02 NOTE — H&P (Signed)
Subjective:   Patient is a 65 y.o. female presents with Food impaction. She has had a long history of reflux and difficulty with food going down there is only had one prior food impaction approximately 20 years ago which was removed with subsequent dilatation. She was eating [place] food last night and has been unable to swallow since that time. No anticoagulation.. Procedure including risks and benefits discussed in office.  Patient Active Problem List   Diagnosis Date Noted  . Medication management 06/23/2014  . Prediabetes 03/27/2014  . Vaginal atrophy 03/27/2014  . Hyperlipidemia   . Hypertension   . Anxiety   . GERD (gastroesophageal reflux disease)   . ADD (attention deficit disorder)   . Depression   . Vitamin D deficiency   . Hypothyroidism 04/25/2008  . Iron deficiency anemia 04/25/2008  . Obstructive sleep apnea 11/16/2007  . Allergic rhinitis 11/16/2007  . Headache 11/16/2007  . PULMONARY EMBOLISM, HX OF 11/16/2007   Past Medical History  Diagnosis Date  . Hyperlipidemia   . Hypertension   . Anxiety   . GERD (gastroesophageal reflux disease)   . ADD (attention deficit disorder)   . Depression   . Vitamin D deficiency   . Hypothyroid   . PONV (postoperative nausea and vomiting)     Past Surgical History  Procedure Laterality Date  . Foot surgery Right   . Cesarean section    . Tonsillectomy  2002    Prescriptions prior to admission  Medication Sig Dispense Refill Last Dose  . ALPRAZolam (XANAX) 1 MG tablet 1/2-1 tablet as needed for sleep/anxiety 30 tablet 0 Past Week at Unknown time  . atenolol (TENORMIN) 50 MG tablet TAKE 1/2 TO 1 TABLET BY MOUTH EVERY NIGHT AT BEDTIME 90 tablet 2 09/01/2015 at Unknown time  . levothyroxine (SYNTHROID, LEVOTHROID) 125 MCG tablet TAKE 1 TABLET BY MOUTH ON AN EMPTY STOMACH  - DUE FOR 6 MONTH  FOLLOWUP OFFICE VISIT IN OCT 90 tablet 1 09/01/2015 at Unknown time  . ranitidine (ZANTAC) 300 MG tablet TAKE 1 TABLET BY MOUTH TWICE A  DAY WHILE TRYING TO GET OFF OF PPI, THEN CAN USE 1 TABLET AT NIGHT. 60 tablet 2 09/01/2015 at Unknown time  . amphetamine-dextroamphetamine (ADDERALL) 20 MG tablet Take 1/2 to 1 tablet if needed for ADD 30 tablet 0   . carisoprodol (SOMA) 250 MG tablet Take 1 tablet (250 mg total) by mouth at bedtime. 30 tablet 0 Taking  . clotrimazole-betamethasone (LOTRISONE) cream Apply 1 application topically 2 (two) times daily. 45 g 2 Taking  . conjugated estrogens (PREMARIN) vaginal cream Place 1 Applicatorful vaginally daily. 42.5 g 3 Taking  . fluconazole (DIFLUCAN) 150 MG tablet Take 1 tablet (150 mg total) by mouth daily. 1 tablet 3   . lidocaine (LIDODERM) 5 % Place 1 patch onto the skin daily. Remove & Discard patch within 12 hours or as directed by MD 30 patch 3 Taking  . meclizine (ANTIVERT) 25 MG tablet 1/2-1 pill up to 3 times daily for motion sickness/dizziness 30 tablet 0 Taking  . meloxicam (MOBIC) 15 MG tablet Take 1 tablet (15 mg total) by mouth daily. 90 tablet 1 Taking  . metoCLOPramide (REGLAN) 10 MG tablet Take 1 tablet (10 mg total) by mouth 3 (three) times daily with meals. 90 tablet 1 Taking  . nystatin (NYSTATIN) powder Apply daily after a shower as needed 60 g 2   . Vitamin D, Ergocalciferol, (DRISDOL) 50000 UNITS CAPS capsule 1 pill 3 days a week  30 capsule 1 Taking   Allergies  Allergen Reactions  . Cefprozil   . Codeine     REACTION: vomiting  . Hydrocodone-Acetaminophen   . Levofloxacin   . Morphine   . Pravastatin Swelling    Social History  Substance Use Topics  . Smoking status: Never Smoker   . Smokeless tobacco: Not on file  . Alcohol Use: Yes     Comment: occasional    Family History  Problem Relation Age of Onset  . Heart disease Father   . Hypertension Father      Objective:   Patient Vitals for the past 8 hrs:  BP Temp Temp src Pulse Resp SpO2 Height Weight  09/02/15 1331 (!) 148/58 mmHg 98.1 F (36.7 C) Oral 75 19 98 %  (1.575 m) 71.215 kg (157  lb)         See MD Preop evaluation      Assessment:   1. Food impaction of the esophagus  Plan:   We will plan on proceeding with EGD to remove the food impaction. This is been discussed with the patient including the risk of aspiration, bleeding, and slight risk of perforation.

## 2015-09-23 ENCOUNTER — Other Ambulatory Visit: Payer: Self-pay | Admitting: Physician Assistant

## 2015-10-22 ENCOUNTER — Other Ambulatory Visit: Payer: Self-pay | Admitting: Physician Assistant

## 2015-10-29 ENCOUNTER — Other Ambulatory Visit: Payer: Self-pay | Admitting: Physician Assistant

## 2015-10-29 DIAGNOSIS — F988 Other specified behavioral and emotional disorders with onset usually occurring in childhood and adolescence: Secondary | ICD-10-CM

## 2015-10-29 MED ORDER — AMPHETAMINE-DEXTROAMPHETAMINE 20 MG PO TABS: ORAL_TABLET | ORAL | 0 refills | Status: DC

## 2015-11-25 ENCOUNTER — Encounter: Payer: Self-pay | Admitting: Physician Assistant

## 2015-11-26 ENCOUNTER — Other Ambulatory Visit: Payer: Self-pay | Admitting: Internal Medicine

## 2015-12-27 ENCOUNTER — Encounter: Payer: Self-pay | Admitting: *Deleted

## 2015-12-30 ENCOUNTER — Ambulatory Visit (INDEPENDENT_AMBULATORY_CARE_PROVIDER_SITE_OTHER): Payer: BLUE CROSS/BLUE SHIELD | Admitting: Physician Assistant

## 2015-12-30 ENCOUNTER — Encounter: Payer: Self-pay | Admitting: Physician Assistant

## 2015-12-30 VITALS — BP 126/64 | HR 83 | Temp 97.3°F | Resp 16 | Ht 62.0 in | Wt 163.0 lb

## 2015-12-30 DIAGNOSIS — K219 Gastro-esophageal reflux disease without esophagitis: Secondary | ICD-10-CM

## 2015-12-30 DIAGNOSIS — B3789 Other sites of candidiasis: Secondary | ICD-10-CM

## 2015-12-30 DIAGNOSIS — E039 Hypothyroidism, unspecified: Secondary | ICD-10-CM

## 2015-12-30 DIAGNOSIS — Z79899 Other long term (current) drug therapy: Secondary | ICD-10-CM | POA: Diagnosis not present

## 2015-12-30 DIAGNOSIS — E559 Vitamin D deficiency, unspecified: Secondary | ICD-10-CM | POA: Diagnosis not present

## 2015-12-30 DIAGNOSIS — E785 Hyperlipidemia, unspecified: Secondary | ICD-10-CM

## 2015-12-30 DIAGNOSIS — F988 Other specified behavioral and emotional disorders with onset usually occurring in childhood and adolescence: Secondary | ICD-10-CM

## 2015-12-30 DIAGNOSIS — I10 Essential (primary) hypertension: Secondary | ICD-10-CM

## 2015-12-30 DIAGNOSIS — L570 Actinic keratosis: Secondary | ICD-10-CM

## 2015-12-30 MED ORDER — ATENOLOL 50 MG PO TABS: ORAL_TABLET | ORAL | 2 refills | Status: DC

## 2015-12-30 MED ORDER — AMPHETAMINE-DEXTROAMPHETAMINE 20 MG PO TABS: ORAL_TABLET | ORAL | 0 refills | Status: DC

## 2015-12-30 MED ORDER — TRIAMCINOLONE ACETONIDE 0.5 % EX CREA: 1.0000 "application " | TOPICAL_CREAM | Freq: Two times a day (BID) | CUTANEOUS | 2 refills | Status: DC

## 2015-12-30 MED ORDER — NYSTATIN 100000 UNIT/GM EX CREA: 1.0000 "application " | TOPICAL_CREAM | Freq: Two times a day (BID) | CUTANEOUS | 1 refills | Status: DC

## 2015-12-30 MED ORDER — TERBINAFINE HCL 250 MG PO TABS: 250.0000 mg | ORAL_TABLET | Freq: Every day | ORAL | 0 refills | Status: AC

## 2015-12-30 NOTE — Progress Notes (Signed)
Assessment and Plan:  1. Hypertension -Continue medication, monitor blood pressure at home. Continue DASH diet.  Reminder to go to the ER if any CP, SOB, nausea, dizziness, severe HA, changes vision/speech, left arm numbness and tingling and jaw pain.  2. Cholesterol -Continue diet and exercise. Check cholesterol.   3. Actinic keratosis cryo  4. Vitamin D Def - check level and continue medications.   5. Hypothyroidism -check TSH level, continue medications the same, reminded to take on an empty stomach 30-3960mins before food.   6. Rash Nystatin cream/trimacinolone If not better refer derm  7. Seb Keratosis 3 freeze and thaw technique   Continue diet and meds as discussed. Further disposition pending results of labs. Over 30 minutes of exam, counseling, chart review, and critical decision making was performed  Future Appointments Date Time Provider Department Center  06/23/2016 10:00 AM Quentin MullingAmanda Willys Salvino, PA-C [facility] None     HPI 65 y.o. female  presents for 6 month follow up on hypertension, cholesterol, prediabetes, and vitamin D deficiency.   Her blood pressure has been controlled at home, today their BP is BP: 126/64  She does not workout. She denies chest pain, shortness of breath, dizziness.  She is on cholesterol medication and denies myalgias. Her cholesterol is not at goal. The cholesterol last visit was:   Lab Results  Component Value Date   CHOL 197 06/23/2015   HDL 47 06/23/2015   LDLCALC 113 06/23/2015   LDLDIRECT 138.2 04/25/2008   TRIG 184 (H) 06/23/2015   CHOLHDL 4.2 06/23/2015   Last Z6XA1C in the office was:  Lab Results  Component Value Date   HGBA1C 5.5 06/23/2015   Patient is on Vitamin D supplement.   Lab Results  Component Value Date   VD25OH 28 (L) 11/19/2014     She is on thyroid medication. Her medication was not changed last visit.   Lab Results  Component Value Date   TSH 0.48 06/23/2015  .   Current Medications:  Current Outpatient  Prescriptions on File Prior to Visit  Medication Sig Dispense Refill  . ALPRAZolam (XANAX) 1 MG tablet 1/2-1 tablet as needed for sleep/anxiety 30 tablet 0  . amphetamine-dextroamphetamine (ADDERALL) 20 MG tablet Take 1/2 to 1 tablet if needed for ADD 30 tablet 0  . atenolol (TENORMIN) 50 MG tablet TAKE 1/2 TO 1 TABLET BY MOUTH EVERY NIGHT AT BEDTIME 90 tablet 2  . carisoprodol (SOMA) 250 MG tablet Take 1 tablet (250 mg total) by mouth at bedtime. 30 tablet 0  . clotrimazole-betamethasone (LOTRISONE) cream Apply 1 application topically 2 (two) times daily. 45 g 2  . levothyroxine (SYNTHROID, LEVOTHROID) 125 MCG tablet TAKE 1 TABLET BY MOUTH ON AN EMPTY STOMACH  - DUE FOR 6 MONTH  FOLLOWUP OFFICE VISIT IN OCT 90 tablet 0  . lidocaine (LIDODERM) 5 % Place 1 patch onto the skin daily. Remove & Discard patch within 12 hours or as directed by MD 30 patch 3  . meclizine (ANTIVERT) 25 MG tablet 1/2-1 pill up to 3 times daily for motion sickness/dizziness 30 tablet 0  . meloxicam (MOBIC) 15 MG tablet TAKE 1 TABLET (15 MG TOTAL) BY MOUTH DAILY. 90 tablet 0  . nystatin (NYSTATIN) powder Apply daily after a shower as needed 60 g 2  . PREMARIN vaginal cream PLACE 1 APPLICATORFUL VAGINALLY DAILY. 30 g 2  . ranitidine (ZANTAC) 300 MG tablet TAKE 1 TABLET BY MOUTH TWICE A DAY WHILE TRYING TO GET OFF OF PPI, THEN CAN USE  1 TABLET AT NIGHT. 60 tablet 2  . Vitamin D, Ergocalciferol, (DRISDOL) 50000 UNITS CAPS capsule 1 pill 3 days a week 30 capsule 1   No current facility-administered medications on file prior to visit.    Medical History:  Past Medical History:  Diagnosis Date  . ADD (attention deficit disorder)   . Anxiety   . Depression   . GERD (gastroesophageal reflux disease)   . Hyperlipidemia   . Hypertension   . Hypothyroid   . PONV (postoperative nausea and vomiting)   . Vitamin D deficiency    Allergies:  Allergies  Allergen Reactions  . Cefprozil   . Codeine     REACTION: vomiting  .  Hydrocodone-Acetaminophen   . Levofloxacin   . Morphine   . Pravastatin Swelling     Review of Systems:  Review of Systems  Constitutional: Negative.   HENT: Negative.   Eyes: Negative.   Respiratory: Negative.   Cardiovascular: Negative.   Gastrointestinal: Negative.   Genitourinary: Negative.   Musculoskeletal: Negative.   Skin: Positive for rash. Negative for itching.  Neurological: Negative.   Endo/Heme/Allergies: Negative.   Psychiatric/Behavioral: Negative.     Family history- Review and unchanged Social history- Review and unchanged Physical Exam: BP 126/64   Pulse 83   Temp 97.3 F (36.3 C)   Resp 16   Ht 5\' 2"  (1.575 m)   Wt 163 lb (73.9 kg)   SpO2 95%   BMI 29.81 kg/m  Wt Readings from Last 3 Encounters:  12/30/15 163 lb (73.9 kg)  09/02/15 157 lb (71.2 kg)  06/23/15 157 lb 6.4 oz (71.4 kg)   General Appearance: Well nourished, in no apparent distress. Eyes: PERRLA, EOMs, conjunctiva no swelling or erythema Sinuses: No Frontal/maxillary tenderness ENT/Mouth: Ext aud canals clear, TMs without erythema, bulging. No erythema, swelling, or exudate on post pharynx.  Tonsils not swollen or erythematous. Hearing normal.  Neck: Supple, thyroid normal.  Respiratory: Respiratory effort normal, BS equal bilaterally without rales, rhonchi, wheezing or stridor.  Cardio: RRR with no MRGs. Brisk peripheral pulses without edema.  Abdomen: Soft, + BS,  Non tender, no guarding, rebound, hernias, masses. Lymphatics: Non tender without lymphadenopathy.  Musculoskeletal: Full ROM, 5/5 strength, Normal gait Skin: gluteal cleft with well demarcated erythematous rash with white exudate, extends to perianal area. Scaly erythematous patch on right thigh, arms. Warm, dry without rashes, lesions, ecchymosis.  Neuro: Cranial nerves intact. Normal muscle tone, no cerebellar symptoms. Psych: Awake and oriented X 3, normal affect, Insight and Judgment appropriate.    Quentin MullingAmanda Sosha Shepherd,  PA-C 11:35 AM [facility]

## 2015-12-30 NOTE — Patient Instructions (Signed)
Actinic Keratosis Actinic keratosis is a precancerous growth on the skin. This means it could develop into skin cancer if it is not treated. About 1% of actinic keratoses turn into skin cancer within a year. It is important to have all such growths removed to prevent them from developing into skin cancer. CAUSES  Actinic keratosis is caused by getting too much ultraviolet (UV) radiation from the sun or other UV light sources. RISK FACTORS Factors that increase your chances of getting actinic keratosis include:  Having light-colored skin and blue eyes.  Having blonde or red hair.  Spending a lot of time in the sun.  Age. The risk of actinic keratosis increases with age. SYMPTOMS  Actinic keratosis growths look like scaly, rough spots of skin. They can be as small as a pinhead or as big as a quarter. They may itch, hurt, or feel sensitive. Sometimes there is a little tag of pink or gray skin growing off them. In some cases, actinic keratoses are easier felt than seen. They do not go away with the use of moisturizing lotions or creams. Actinic keratoses appear most often on areas of skin that get a lot of sun exposure. These areas include the:  Scalp.  Face.  Ears.  Lips.  Upper back.  Backs of the hands.  Forearms. DIAGNOSIS  Your health care provider can usually tell what is wrong by performing a physical exam. A tissue sample (biopsy) may also be taken and examined under a microscope. TREATMENT  Actinic keratosis can be treated several ways. Most treatments can be done in your health care provider's office. Treatment options may include:  Curettage. A tool is used to gently scrape off the growth.  Cryosurgery. Liquid nitrogen is applied to the growth to freeze it. The growth eventually falls off the skin.  Medicated creams, such as 5-fluorouracil or imiquimod. The medicine destroys the cells in the growth.  Chemical peels. Chemicals are applied to the growth and the outer  layers of skin are peeled off.  Photodynamic therapy. A drug that makes your skin more sensitive to light is applied to the skin. A strong, blue light is aimed at the skin and destroys the growth. PREVENTION  To prevent future sun damage:  Try to avoid the sun between 10:00 a.m. and 4:00 p.m. when it is the strongest.  Use a sunscreen or sunblock with SPF 30 or greater.  Apply sunscreen at least 30 minutes before exposure to the sun.  Always wear protective hats, clothing, and sunglasses with UV protection.  Avoid medicines, herbs, and foods that increase your sensitivity to sunlight.  Avoid tanning beds. HOME CARE INSTRUCTIONS   If your skin was covered with a bandage, change and remove the bandage as directed by your health care provider.  Keep the treated area dry as directed by your health care provider.  Apply any creams as prescribed by your health care provider. Follow the directions carefully.  Check your skin regularly for any changes.  Visit a skin doctor (dermatologist) every year for a skin exam. SEEK MEDICAL CARE IF:   Your skin does not heal and becomes irritated, red, or bleeds.  You notice any changes or new growths on your skin.   This information is not intended to replace advice given to you by your health care provider. Make sure you discuss any questions you have with your health care provider.   Document Released: 05/06/2008 Document Revised: 02/28/2014 Document Reviewed: 03/21/2011 Elsevier Interactive Patient Education 2016 Elsevier   Inc.  

## 2016-01-19 ENCOUNTER — Other Ambulatory Visit: Payer: Self-pay | Admitting: Internal Medicine

## 2016-01-19 DIAGNOSIS — Z1231 Encounter for screening mammogram for malignant neoplasm of breast: Secondary | ICD-10-CM

## 2016-01-28 ENCOUNTER — Ambulatory Visit (INDEPENDENT_AMBULATORY_CARE_PROVIDER_SITE_OTHER): Payer: BLUE CROSS/BLUE SHIELD | Admitting: Physician Assistant

## 2016-01-28 ENCOUNTER — Encounter: Payer: Self-pay | Admitting: Physician Assistant

## 2016-01-28 VITALS — BP 124/72 | HR 86 | Temp 97.2°F | Resp 14 | Ht 62.0 in | Wt 163.2 lb

## 2016-01-28 DIAGNOSIS — R2232 Localized swelling, mass and lump, left upper limb: Secondary | ICD-10-CM

## 2016-01-28 MED ORDER — EPINEPHRINE 0.15 MG/0.15ML IJ SOAJ: 0.1500 mg | INTRAMUSCULAR | 2 refills | Status: DC | PRN

## 2016-01-28 NOTE — Progress Notes (Signed)
Subjective:    Patient ID: Diana Wolfe, female    DOB: 02/16/1951, 65 y.o.   MRN: [id number]  HPI 65 y.o. WF presents with lump under left arm x 1-2 weeks.  Tender to palpation, no change in size, no warmth, redness, no discharge. Has not done anything for it. Last MGM was 01/30/2015, has appointment Dec 19th. Breast cancer in her mother, no other family history of breast or ovarian cancer. Denies fever, chills, no weight loss.   Wt Readings from Last 3 Encounters:  01/28/16 163 lb 3.2 oz (74 kg)  12/30/15 163 lb (73.9 kg)  09/02/15 157 lb (71.2 kg)   family history includes Cancer (age of onset: 6070) in her mother; Heart disease in her father; Hypertension in her father.  Blood pressure 124/72, pulse 86, temperature 97.2 F (36.2 C), resp. rate 14, height 5\' 2"  (1.575 m), weight 163 lb 3.2 oz (74 kg), SpO2 96 %.  Medications Current Outpatient Prescriptions on File Prior to Visit  Medication Sig  . ALPRAZolam (XANAX) 1 MG tablet 1/2-1 tablet as needed for sleep/anxiety  . amphetamine-dextroamphetamine (ADDERALL) 20 MG tablet Take 1/2 to 1 tablet if needed for ADD  . atenolol (TENORMIN) 50 MG tablet TAKE 1/2 TO 1 TABLET BY MOUTH EVERY NIGHT AT BEDTIME  . carisoprodol (SOMA) 250 MG tablet Take 1 tablet (250 mg total) by mouth at bedtime.  Marland Kitchen. levothyroxine (SYNTHROID, LEVOTHROID) 125 MCG tablet TAKE 1 TABLET BY MOUTH ON AN EMPTY STOMACH  - DUE FOR 6 MONTH  FOLLOWUP OFFICE VISIT IN OCT  . lidocaine (LIDODERM) 5 % Place 1 patch onto the skin daily. Remove & Discard patch within 12 hours or as directed by MD  . meclizine (ANTIVERT) 25 MG tablet 1/2-1 pill up to 3 times daily for motion sickness/dizziness  . meloxicam (MOBIC) 15 MG tablet TAKE 1 TABLET (15 MG TOTAL) BY MOUTH DAILY.  Marland Kitchen. nystatin cream (MYCOSTATIN) Apply 1 application topically 2 (two) times daily.  Marland Kitchen. PREMARIN vaginal cream PLACE 1 APPLICATORFUL VAGINALLY DAILY.  . ranitidine (ZANTAC) 300 MG tablet TAKE 1 TABLET BY MOUTH  TWICE A DAY WHILE TRYING TO GET OFF OF PPI, THEN CAN USE 1 TABLET AT NIGHT.  Marland Kitchen. triamcinolone cream (KENALOG) 0.5 % Apply 1 application topically 2 (two) times daily.  . Vitamin D, Ergocalciferol, (DRISDOL) 50000 UNITS CAPS capsule 1 pill 3 days a week   No current facility-administered medications on file prior to visit.     Problem list She has Hypothyroidism; Iron deficiency anemia; Obstructive sleep apnea; Allergic rhinitis; Headache; PULMONARY EMBOLISM, HX OF; Hyperlipidemia; Hypertension; Anxiety; GERD (gastroesophageal reflux disease); ADD (attention deficit disorder); Depression; Vitamin D deficiency; Prediabetes; Vaginal atrophy; and Medication management on her problem list.   Review of Systems  Constitutional: Negative for chills, fatigue, fever and unexpected weight change.  Genitourinary:       Axillary mass  Hematological: Negative for adenopathy.       Objective:   Physical Exam  Constitutional: She appears well-developed and well-nourished. No distress.  Cardiovascular: Normal rate and regular rhythm.   Pulmonary/Chest: Effort normal and breath sounds normal.  No breast masses palpated.   Abdominal: Soft. Bowel sounds are normal.  Lymphadenopathy:       Head (right side): No submandibular and no tonsillar adenopathy present.       Head (left side): No submandibular and no tonsillar adenopathy present.    She has no cervical adenopathy.       Right cervical: No superficial  cervical, no deep cervical and no posterior cervical adenopathy present.      Left cervical: No superficial cervical, no deep cervical and no posterior cervical adenopathy present.       Right axillary: No pectoral and no lateral adenopathy present.       Left axillary: No pectoral and no lateral adenopathy present. Left axillary 1cm x 1 cm deep non mobile mass, round, , slightly tender, no erythema, warmth, swelling.   Skin: She is not diaphoretic.       Assessment & Plan:  Left axillary mass-  deep, but small/round Will do warm compresses, will set up for diagnostic MGM on left with screening on right.   Future Appointments Date Time Provider Department Center  02/09/2016 1:40 PM [facility]  06/23/2016 10:00 AM Quentin MullingAmanda Collier, PA-C [facility] None

## 2016-02-04 ENCOUNTER — Encounter: Payer: Self-pay | Admitting: Physician Assistant

## 2016-02-09 ENCOUNTER — Other Ambulatory Visit: Payer: Self-pay | Admitting: Physician Assistant

## 2016-02-09 ENCOUNTER — Ambulatory Visit
Admission: RE | Admit: 2016-02-09 | Discharge: 2016-02-09 | Disposition: A | Discharge status: Home / Self Care | Payer: BLUE CROSS/BLUE SHIELD | Source: Ambulatory Visit | Attending: Physician Assistant | Admitting: Physician Assistant

## 2016-02-09 ENCOUNTER — Ambulatory Visit
Admission: RE | Admit: 2016-02-09 | Discharge: 2016-02-09 | Disposition: A | Discharge status: Home / Self Care | Payer: BLUE CROSS/BLUE SHIELD | Source: Ambulatory Visit | Attending: Internal Medicine | Admitting: Internal Medicine

## 2016-02-09 DIAGNOSIS — R2232 Localized swelling, mass and lump, left upper limb: Secondary | ICD-10-CM

## 2016-02-09 DIAGNOSIS — N63 Unspecified lump in unspecified breast: Secondary | ICD-10-CM

## 2016-02-09 DIAGNOSIS — Z1231 Encounter for screening mammogram for malignant neoplasm of breast: Secondary | ICD-10-CM

## 2016-03-14 ENCOUNTER — Other Ambulatory Visit: Payer: Self-pay | Admitting: Physician Assistant

## 2016-03-14 DIAGNOSIS — F988 Other specified behavioral and emotional disorders with onset usually occurring in childhood and adolescence: Secondary | ICD-10-CM

## 2016-03-14 MED ORDER — LEVOTHYROXINE SODIUM 125 MCG PO TABS: 125.0000 ug | ORAL_TABLET | Freq: Every day | ORAL | 0 refills | Status: DC

## 2016-03-14 MED ORDER — AMPHETAMINE-DEXTROAMPHETAMINE 20 MG PO TABS: ORAL_TABLET | ORAL | 0 refills | Status: DC

## 2016-04-15 ENCOUNTER — Other Ambulatory Visit: Payer: Self-pay | Admitting: Physician Assistant

## 2016-04-26 ENCOUNTER — Other Ambulatory Visit: Payer: Self-pay | Admitting: *Deleted

## 2016-04-26 MED ORDER — LEVOTHYROXINE SODIUM 125 MCG PO TABS: 125.0000 ug | ORAL_TABLET | Freq: Every day | ORAL | 0 refills | Status: DC

## 2016-05-23 ENCOUNTER — Encounter: Payer: Self-pay | Admitting: Physician Assistant

## 2016-05-23 ENCOUNTER — Ambulatory Visit (INDEPENDENT_AMBULATORY_CARE_PROVIDER_SITE_OTHER): Payer: BLUE CROSS/BLUE SHIELD | Admitting: Physician Assistant

## 2016-05-23 VITALS — BP 140/76 | HR 80 | Temp 97.7°F | Resp 16 | Ht 62.0 in | Wt 160.8 lb

## 2016-05-23 DIAGNOSIS — R5383 Other fatigue: Secondary | ICD-10-CM

## 2016-05-23 DIAGNOSIS — R350 Frequency of micturition: Secondary | ICD-10-CM

## 2016-05-23 DIAGNOSIS — M25511 Pain in right shoulder: Secondary | ICD-10-CM

## 2016-05-23 DIAGNOSIS — M25512 Pain in left shoulder: Secondary | ICD-10-CM | POA: Diagnosis not present

## 2016-05-23 LAB — CBC WITH DIFFERENTIAL/PLATELET
Basophils Absolute: 84 cells/uL (ref 0–200)
Basophils Relative: 1 %
Eosinophils Absolute: 252 cells/uL (ref 15–500)
Eosinophils Relative: 3 %
HCT: 44.5 % (ref 35.0–45.0)
Hemoglobin: 15.2 g/dL (ref 11.7–15.5)
Lymphocytes Relative: 38 %
Lymphs Abs: 3192 cells/uL (ref 850–3900)
MCH: 29.7 pg (ref 27.0–33.0)
MCHC: 34.2 g/dL (ref 32.0–36.0)
MCV: 86.9 fL (ref 80.0–100.0)
MPV: 9.8 fL (ref 7.5–12.5)
Monocytes Absolute: 672 cells/uL (ref 200–950)
Monocytes Relative: 8 %
Neutro Abs: 4200 cells/uL (ref 1500–7800)
Neutrophils Relative %: 50 %
Platelets: 336 10*3/uL (ref 140–400)
RBC: 5.12 MIL/uL — ABNORMAL HIGH (ref 3.80–5.10)
RDW: 13.8 % (ref 11.0–15.0)
WBC: 8.4 10*3/uL (ref 3.8–10.8)

## 2016-05-23 LAB — HEPATIC FUNCTION PANEL
ALT: 20 U/L (ref 6–29)
AST: 20 U/L (ref 10–35)
Albumin: 4.1 g/dL (ref 3.6–5.1)
Alkaline Phosphatase: 83 U/L (ref 33–130)
Bilirubin, Direct: 0.1 mg/dL (ref <=0.2)
Indirect Bilirubin: 0.4 mg/dL (ref 0.2–1.2)
Total Bilirubin: 0.5 mg/dL (ref 0.2–1.2)
Total Protein: 6.5 g/dL (ref 6.1–8.1)

## 2016-05-23 LAB — BASIC METABOLIC PANEL WITH GFR
BUN: 14 mg/dL (ref 7–25)
CO2: 25 mmol/L (ref 20–31)
Calcium: 10 mg/dL (ref 8.6–10.4)
Chloride: 104 mmol/L (ref 98–110)
Creat: 0.93 mg/dL (ref 0.50–0.99)
GFR, Est African American: 75 mL/min (ref >=60)
GFR, Est Non African American: 65 mL/min (ref >=60)
Glucose, Bld: 85 mg/dL (ref 65–99)
Potassium: 4.4 mmol/L (ref 3.5–5.3)
Sodium: 140 mmol/L (ref 135–146)

## 2016-05-23 LAB — SEDIMENTATION RATE: Sed Rate: 4 mm/hr (ref 0–30)

## 2016-05-23 MED ORDER — AMOXICILLIN-POT CLAVULANATE 875-125 MG PO TABS: 1.0000 | ORAL_TABLET | Freq: Two times a day (BID) | ORAL | 0 refills | Status: DC

## 2016-05-23 MED ORDER — LOSARTAN POTASSIUM 50 MG PO TABS: 50.0000 mg | ORAL_TABLET | Freq: Every day | ORAL | 0 refills | Status: DC

## 2016-05-23 NOTE — Patient Instructions (Addendum)
Stop the atenolol, start on the losartan 1/2-1 tablet daily for BP Continue to monitor BP and HR Can start Augmentin for possible UTI/diverticulitis  If any worsening chest pain, any shortness of breath, any worsening Ab pain, or any worsening symptoms go to the ER   Weakness Weakness is a lack of strength. You may feel weak all over your body (generalized), or you may feel weak in one specific part of your body (focal). Common causes of weakness include:  Infection and immune system disorders.  Physical exhaustion.  Internal bleeding or other blood loss that results in a lack of red blood cells (anemia).  Dehydration.  An imbalance in mineral (electrolyte) levels, such as potassium.  Heart disease, circulation problems, or stroke. Other causes include:  Some medicines or cancer treatment.  Stress, anxiety, or depression.  Nervous system disorders.  Thyroid disorders.  Loss of muscle strength because of age or inactivity.  Poor sleep quality or sleep disorders. The cause of your weakness may not be known. Some causes of weakness can be serious, so it is important to see your health care provider. Follow these instructions at home:  Rest as needed.  Try to get enough sleep. Talk to your health care provider about how much sleep you need each night.  Take over-the-counter and prescription medicines only as told by your health care provider.  Eat a healthy, well-balanced diet. This includes:  Proteins to build muscles, such as lean meats and fish.  Fresh fruits and vegetables.  Carbohydrates to boost energy, such as whole grains.  Drink enough fluid to keep your urine clear or pale yellow.  Do strength exercises, such as arm curls and leg raises, for 30 minutes at least 2 days a week or as told by your health care provider.  Consider working with a physical therapist or trainer who can develop an exercise plan to help you gain muscle strength.  Keep all follow-up  visits as told by your health care provider. This is important. Contact a health care provider if:  Your weakness does not improve or gets worse.  Your weakness affects your ability to think clearly.  Your weakness affects your ability to do your normal daily activities. Get help right away if:  You develop sudden weakness.  You have trouble breathing or shortness of breath.  You have problems with your vision.  You have trouble talking or swallowing.  You have trouble standing or walking.  You have chest pain.  You are light-headed or lose consciousness. This information is not intended to replace advice given to you by your health care provider. Make sure you discuss any questions you have with your health care provider. Document Released: 02/07/2005 Document Revised: 03/05/2015 Document Reviewed: 11/28/2014 Elsevier Interactive Patient Education  2017 [employer].

## 2016-05-23 NOTE — Progress Notes (Signed)
Subjective:    Patient ID: Diana Wolfe, female    DOB: 11-06-1950, 66 y.o.   MRN: [id number]  HPI 66 y.o. WF with history of GERD, sleep apnea, HTN, hypothyroidism, history of PE after fracture, chol, iron def presents with fatigue.  She states last Monday, 1 week ago, started to have no energy, trouble sleeping, having pain bilateral collar bones feels like a pinching sensation with occ neck tightness, feeling light headed, HA intermittently, some AB cramping, diarrhea x 2 days (4 x total), took lomotil one day elevated BP, lower HR until yesterday. Did not take BP last night but did take this AM. She has had some nausea.   Blood pressure 140/76, pulse 80, temperature 97.7 F (36.5 C), resp. rate 16, height  (1.575 m), weight 160 lb 12.8 oz (72.9 kg), SpO2 95 %.  Medications Current Outpatient Prescriptions on File Prior to Visit  Medication Sig  . ALPRAZolam (XANAX) 1 MG tablet 1/2-1 tablet as needed for sleep/anxiety  . atenolol (TENORMIN) 50 MG tablet TAKE 1/2 TO 1 TABLET BY MOUTH EVERY NIGHT AT BEDTIME  . carisoprodol (SOMA) 250 MG tablet Take 1 tablet (250 mg total) by mouth at bedtime.  Marland Kitchen EPINEPHrine 0.15 MG/0.15ML IJ injection Inject 0.15 mLs (0.15 mg total) into the muscle as needed for anaphylaxis.  Marland Kitchen levothyroxine (SYNTHROID, LEVOTHROID) 125 MCG tablet Take 1 tablet (125 mcg total) by mouth daily before breakfast.  . lidocaine (LIDODERM) 5 % Place 1 patch onto the skin daily. Remove & Discard patch within 12 hours or as directed by MD  . meclizine (ANTIVERT) 25 MG tablet 1/2-1 pill up to 3 times daily for motion sickness/dizziness  . meloxicam (MOBIC) 15 MG tablet TAKE 1 TABLET (15 MG TOTAL) BY MOUTH DAILY.  Marland Kitchen nystatin cream (MYCOSTATIN) Apply 1 application topically 2 (two) times daily.  Marland Kitchen PREMARIN vaginal cream PLACE 1 APPLICATORFUL VAGINALLY DAILY.  . ranitidine (ZANTAC) 300 MG tablet TAKE 1 TABLET BY MOUTH TWICE A DAY WHILE TRYING TO GET OFF OF PPI, THEN CAN USE 1  TABLET AT NIGHT.  Marland Kitchen triamcinolone cream (KENALOG) 0.5 % Apply 1 application topically 2 (two) times daily.  . Vitamin D, Ergocalciferol, (DRISDOL) 50000 units CAPS capsule TAKE 1 CAPSULE BY MOUTH 3 DAYS A WEEK.  Marland Kitchen amphetamine-dextroamphetamine (ADDERALL) 20 MG tablet Take 1/2 to 1 tablet if needed for ADD   No current facility-administered medications on file prior to visit.     Problem list She has Hypothyroidism; Iron deficiency anemia; Obstructive sleep apnea; Allergic rhinitis; Headache; PULMONARY EMBOLISM, HX OF; Hyperlipidemia; Hypertension; Anxiety; GERD (gastroesophageal reflux disease); ADD (attention deficit disorder); Depression; Vitamin D deficiency; Prediabetes; Vaginal atrophy; and Medication management on her problem list.  Review of Systems  Constitutional: Positive for diaphoresis (at night, not waking up soaked) and fatigue. Negative for activity change, appetite change, chills, fever and unexpected weight change.  HENT: Negative.   Eyes: Negative.   Respiratory: Negative.  Negative for shortness of breath.   Cardiovascular: Negative.  Negative for chest pain and palpitations.  Gastrointestinal: Positive for abdominal pain, diarrhea and nausea. Negative for abdominal distention, anal bleeding, blood in stool, constipation, rectal pain and vomiting.  Genitourinary: Positive for frequency.  Musculoskeletal: Positive for arthralgias and neck pain. Negative for back pain, gait problem, joint swelling, myalgias and neck stiffness.  Skin: Negative.  Negative for rash.  Neurological: Positive for light-headedness and headaches. Negative for dizziness, tremors, seizures, syncope, facial asymmetry, speech difficulty, weakness and numbness.  Hematological: Negative.  Psychiatric/Behavioral: Negative.        Objective:   Physical Exam  Constitutional: She is oriented to person, place, and time. She appears well-developed and well-nourished.  HENT:  Head: Normocephalic and  atraumatic.  Right Ear: External ear normal.  Left Ear: External ear normal.  Mouth/Throat: Oropharynx is clear and moist.  Eyes: Conjunctivae and EOM are normal. Pupils are equal, round, and reactive to light.  Neck: Normal range of motion. Neck supple. No thyromegaly present.  Cardiovascular: Normal rate, regular rhythm and normal heart sounds.  Exam reveals no gallop and no friction rub.   No murmur heard. Pulmonary/Chest: Effort normal and breath sounds normal. No respiratory distress. She has no wheezes.  Abdominal: Soft. Bowel sounds are normal. She exhibits no distension and no mass. There is tenderness (right lower quadrant). There is rebound. There is no guarding.  Musculoskeletal: Normal range of motion.  Lymphadenopathy:    She has no cervical adenopathy.  Neurological: She is alert and oriented to person, place, and time. She displays normal reflexes. No cranial nerve deficit. Coordination normal.  Skin: Skin is warm and dry.  Patient's face appears flushed  Psychiatric: She has a normal mood and affect.       Assessment & Plan:  Multitude of symptoms including fatigue, AB pain, diarrhea, urinary frequency, shoulder/chest pain Will get EKG, and cardiac enzymes rule out MI with family history No symptoms of PE, no swelling, no SOB, palpitations, etc, very low risk  Check labs for infection With frequency and diarrhea/RLQ pain will start on Augmentin for possible UTI/diverticulitis If all negative with flushing and diarrhea may want to get labs to rule out carcinoid Will also stop atenolol and switch to losartan Monitor BP/HR at home - CBC with Differential/Platelet - BASIC METABOLIC PANEL WITH GFR - Hepatic function panel - TSH - EKG 12-Lead - DG Chest 2 View; Future - Troponin I - CK total and CKMB (cardiac)not at [facility] - Urinalysis, Routine w reflex microscopic - Urine culture - amoxicillin-clavulanate (AUGMENTIN) 875-125 MG tablet; Take 1 tablet by mouth 2 (two)  times daily. 7 days  Dispense: 14 tablet; Refill: 0 - C-reactive protein - Sedimentation rate

## 2016-05-24 ENCOUNTER — Encounter: Payer: Self-pay | Admitting: Physician Assistant

## 2016-05-24 ENCOUNTER — Other Ambulatory Visit: Payer: Self-pay | Admitting: Physician Assistant

## 2016-05-24 DIAGNOSIS — E039 Hypothyroidism, unspecified: Secondary | ICD-10-CM

## 2016-05-24 LAB — CK TOTAL AND CKMB (NOT AT ARMC)
CK, MB: 1 ng/mL (ref 0.0–5.0)
Relative Index: 1.7 (ref 0.0–4.0)
Total CK: 60 U/L (ref 7–177)

## 2016-05-24 LAB — URINALYSIS, ROUTINE W REFLEX MICROSCOPIC
Bilirubin Urine: NEGATIVE
Glucose, UA: NEGATIVE
Hgb urine dipstick: NEGATIVE
Ketones, ur: NEGATIVE
Leukocytes, UA: NEGATIVE
Nitrite: NEGATIVE
Protein, ur: NEGATIVE
Specific Gravity, Urine: 1.011 (ref 1.001–1.035)
pH: 7 (ref 5.0–8.0)

## 2016-05-24 LAB — TSH: TSH: 0.11 mIU/L — ABNORMAL LOW

## 2016-05-24 LAB — URINE CULTURE

## 2016-05-24 LAB — TROPONIN I: Troponin I: <0.01 ng/mL (ref <=0.05)

## 2016-05-24 LAB — C-REACTIVE PROTEIN: CRP: 6.6 mg/L (ref <8.0)

## 2016-06-22 NOTE — Progress Notes (Signed)
Complete Physical  Assessment and Plan: Essential hypertension - continue medications, DASH diet, exercise and monitor at home. Call if greater than 130/80.  Cut atenolol in half due to fatigue/sinus brady, monitor BP/HR, Go to the ER if any CP, SOB, nausea, dizziness, severe HA, changes vision/speech - CBC with Differential/Platelet - BASIC METABOLIC PANEL WITH GFR - Hepatic function panel - Urinalysis, Routine w reflex microscopic - Microalbumin / creatinine urine ratio - EKG 12-Lead   Prediabetes Discussed general issues about diabetes pathophysiology and management., Educational material distributed., Suggested low cholesterol diet., Encouraged aerobic exercise., Discussed foot care., Reminded to get yearly retinal exam.   Hypothyroidism, unspecified hypothyroidism type Hypothyroidism-check TSH level, continue medications the same, reminded to take on an empty stomach 30-66mins before food.  - TSH  Hyperlipidemia -continue medications, check lipids, decrease fatty foods, increase activity. - Lipid panel  Vitamin D deficiency - Vit D  25 hydroxy (rtn osteoporosis monitoring)  Medication management - Magnesium  Obstructive sleep apnea Has been 5 years since mouth piece, getting up 3-4 times a night with increased fatigue, suggest follow up with dental sleep doctor for evaluation, given numbers  Gastroesophageal reflux disease without esophagitis GERD- will try to get off PPiI given info for taper and zantac sent in   Allergic rhinitis, unspecified allergic rhinitis type Continue OTC allergy pills  Nonintractable headache, unspecified chronicity pattern, unspecified headache type Check labs, normal neuro  Vaginal atrophy Continue premarin cream  Iron deficiency anemia Check CBC  PULMONARY EMBOLISM, HX OF S/p fracture, monitor  Depression remission  Anxiety Declines meds at this time, continue xanax PRN   ADD (attention deficit disorder) -  amphetamine-dextroamphetamine (ADDERALL) 20 MG tablet; Take 1/2 to 1 tablet if needed for ADD  Dispense: 30 tablet; Refill: 0  OAB (overactive bladder) Declines meds at this time, bladder matters given   Discussed med's effects and SE's. Screening labs and tests as requested with regular follow-up as recommended. Future Appointments Date Time Provider Department Center  12/26/2016 10:30 AM Quentin Mulling, PA-C [facility] None  06/27/2017 10:00 AM Quentin Mulling, PA-C [facility] None   HPI 66 y.o. female  presents for a complete physical. Her blood pressure has been controlled at home, HR and BP better with switch to losartan, today their BP is BP: 130/80  States that she is feeling better after switching BP meds, and thyroid pill but she still feels "fuzzy headed".  She does not workout but she walks a lot for her job. She denies chest pain, shortness of breath, dizziness.  She is not on cholesterol medication and denies myalgias. Her cholesterol is at goal. The cholesterol last visit was:   Lab Results  Component Value Date   CHOL 197 06/23/2015   HDL 47 06/23/2015   LDLCALC 113 06/23/2015   LDLDIRECT 138.2 04/25/2008   TRIG 184 (H) 06/23/2015   CHOLHDL 4.2 06/23/2015   She has been working on diet and exercise for prediabetes, and denies paresthesia of the feet, polydipsia and polyuria. Last A1C in the office was: Lab Results  Component Value Date   HGBA1C 5.5 06/23/2015  Patient is on Vitamin D supplement, 10,000.   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 She has urinary frequency during the day as well, declines incontinence, does have urgency. She does have vaginal atrophy and does premarin 2 x a week.    Lab Results  Component Value Date   GFRNONAA 65 05/23/2016   She is on thyroid medication. Her medication was changed last visit, she is on 1 pill daily but 1/2 M,W,F. Lab  Results  Component Value Date   TSH 0.11 (L) 05/23/2016  BMI is Body mass index is 29.04 kg/m., she is working on diet and exercise. Wt Readings from Last 3 Encounters:  06/23/16 158 lb 12.8 oz (72 kg)  05/23/16 160 lb 12.8 oz (72.9 kg)  01/28/16 163 lb 3.2 oz (74 kg)      Current Medications:  Current Outpatient Prescriptions on File Prior to Visit  Medication Sig Dispense Refill  . carisoprodol (SOMA) 250 MG tablet Take 1 tablet (250 mg total) by mouth at bedtime. 30 tablet 0  . EPINEPHrine 0.15 MG/0.15ML IJ injection Inject 0.15 mLs (0.15 mg total) into the muscle as needed for anaphylaxis. 1 Device 2  . levothyroxine (SYNTHROID, LEVOTHROID) 125 MCG tablet Take 1 tablet (125 mcg total) by mouth daily before breakfast. 90 tablet 0  . lidocaine (LIDODERM) 5 % Place 1 patch onto the skin daily. Remove & Discard patch within 12 hours or as directed by MD 30 patch 3  . losartan (COZAAR) 50 MG tablet Take 1 tablet (50 mg total) by mouth daily. 30 tablet 0  . meclizine (ANTIVERT) 25 MG tablet 1/2-1 pill up to 3 times daily for motion sickness/dizziness 30 tablet 0  . meloxicam (MOBIC) 15 MG tablet TAKE 1 TABLET (15 MG TOTAL) BY MOUTH DAILY. 90 tablet 0  . ranitidine (ZANTAC) 300 MG tablet TAKE 1 TABLET BY MOUTH TWICE A DAY WHILE TRYING TO GET OFF OF PPI, THEN CAN USE 1 TABLET AT NIGHT. 60 tablet 2  . Vitamin D, Ergocalciferol, (DRISDOL) 50000 units CAPS capsule TAKE 1 CAPSULE BY MOUTH 3 DAYS A WEEK. 12 capsule 1   No current facility-administered medications on file prior to visit.    Health Maintenance:   Immunization History  Administered Date(s) Administered  . Influenza Whole 11/22/2007  . Td 04/25/2008  . Varicella Zoster Immune Globulin 12/31/2014   Tetanus: 2010 Pneumovax: declines Prevnar 13: N/A Flu vaccine: 2017 Zostavax: 2016  Pap  Dr. Comer Locket 03/2012 due 2019, never abnormal MGM: 01/2016 DEXA: 2007- would like to wait Colonoscopy: 07/2013 Dr. Ewing Schlein, due 5  years EGD: N/A CXr 2010 [place] AB 2009 CT neck 2007 Eye doctor: May 2016 [facility] Dentist: Dr. Denyse Amass in [place], last year  Medical History:  Past Medical History:  Diagnosis Date  . ADD (attention deficit disorder)   . Anxiety   . Depression   . GERD (gastroesophageal reflux disease)   . Hyperlipidemia   . Hypertension   . Hypothyroid   . PONV (postoperative nausea and vomiting)   . Vitamin D deficiency    Allergies Allergies  Allergen Reactions  . Cefprozil   . Codeine     REACTION: vomiting  . Hydrocodone-Acetaminophen   . Levofloxacin   . Morphine   . Pravastatin Swelling    SURGICAL HISTORY She  has a past surgical history that includes Foot surgery (Right); Cesarean section; Tonsillectomy (2002); and Esophagogastroduodenoscopy (N/A, 09/02/2015). FAMILY HISTORY Her family history includes Cancer (age of onset: 19) in her mother; Heart disease in her father; Hypertension in her father. SOCIAL HISTORY She  reports that she has never smoked. She has never used smokeless tobacco. She reports that she drinks alcohol. She reports that she does not use drugs.  Review of Systems  Constitutional: Positive for malaise/fatigue. Negative  for chills, diaphoresis, fever and weight loss.  HENT: Negative.   Eyes: Negative.   Respiratory: Negative.   Cardiovascular: Negative for chest pain, palpitations, orthopnea, claudication, leg swelling and PND.  Gastrointestinal: Positive for heartburn. Negative for abdominal pain, blood in stool, constipation, diarrhea, melena, nausea and vomiting.  Genitourinary: Positive for frequency. Negative for dysuria, flank pain, hematuria and urgency.       Vaginal dryness  Musculoskeletal: Negative for back pain, falls, joint pain, myalgias and neck pain.  Skin: Negative for itching and rash.  Neurological: Negative.  Negative for dizziness and weakness.  Psychiatric/Behavioral: Negative for depression, hallucinations, memory loss, substance  abuse and suicidal ideas. The patient is nervous/anxious. The patient does not have insomnia.     Physical Exam: Estimated body mass index is 29.04 kg/m as calculated from the following:   Height as of this encounter:  (1.575 m).   Weight as of this encounter: 158 lb 12.8 oz (72 kg). BP 130/80   Pulse 83   Temp 97.7 F (36.5 C)   Resp 14   Ht  (1.575 m)   Wt 158 lb 12.8 oz (72 kg)   SpO2 96%   BMI 29.04 kg/m  General Appearance: Well nourished, in no apparent distress. Eyes: PERRLA, EOMs, conjunctiva no swelling or erythema, normal fundi and vessels. Sinuses: No Frontal/maxillary tenderness ENT/Mouth: Ext aud canals clear, normal light reflex with TMs without erythema, bulging.  Good dentition. No erythema, swelling, or exudate on post pharynx. Tonsils not swollen or erythematous. Hearing normal.  Neck: Supple, thyroid normal. No bruits Respiratory: Respiratory effort normal, BS equal bilaterally without rales, rhonchi, wheezing or stridor. Cardio: RRR without murmurs, rubs or gallops. Brisk peripheral pulses without edema.  Chest: symmetric, with normal excursions and percussion. Breasts: defer Abdomen: Soft, +BS. Non tender, no guarding, rebound, hernias, masses, or organomegaly. .  Lymphatics: Non tender without lymphadenopathy.  Genitourinary: defer Musculoskeletal: Full ROM all peripheral extremities,5/5 strength, and normal gait. Skin: right posterior calf presents with non erythematous scaly macule  No lesions, ecchymosis.  Neuro: Cranial nerves intact, reflexes equal bilaterally. Normal muscle tone, no cerebellar symptoms. Sensation intact.  Psych: Awake and oriented X 3, normal affect, Insight and Judgment appropriate.   EKG: no ST changes Aorta Scan Normal    Quentin Mulling 12:00 PM

## 2016-06-23 ENCOUNTER — Encounter: Payer: Self-pay | Admitting: Physician Assistant

## 2016-06-23 ENCOUNTER — Ambulatory Visit (INDEPENDENT_AMBULATORY_CARE_PROVIDER_SITE_OTHER): Payer: BLUE CROSS/BLUE SHIELD | Admitting: Physician Assistant

## 2016-06-23 VITALS — BP 130/80 | HR 83 | Temp 97.7°F | Resp 14 | Ht 62.0 in | Wt 158.8 lb

## 2016-06-23 DIAGNOSIS — Z79899 Other long term (current) drug therapy: Secondary | ICD-10-CM

## 2016-06-23 DIAGNOSIS — F988 Other specified behavioral and emotional disorders with onset usually occurring in childhood and adolescence: Secondary | ICD-10-CM

## 2016-06-23 DIAGNOSIS — J309 Allergic rhinitis, unspecified: Secondary | ICD-10-CM

## 2016-06-23 DIAGNOSIS — R7303 Prediabetes: Secondary | ICD-10-CM

## 2016-06-23 DIAGNOSIS — N952 Postmenopausal atrophic vaginitis: Secondary | ICD-10-CM

## 2016-06-23 DIAGNOSIS — Z0000 Encounter for general adult medical examination without abnormal findings: Secondary | ICD-10-CM

## 2016-06-23 DIAGNOSIS — E785 Hyperlipidemia, unspecified: Secondary | ICD-10-CM

## 2016-06-23 DIAGNOSIS — F419 Anxiety disorder, unspecified: Secondary | ICD-10-CM

## 2016-06-23 DIAGNOSIS — G4733 Obstructive sleep apnea (adult) (pediatric): Secondary | ICD-10-CM

## 2016-06-23 DIAGNOSIS — E559 Vitamin D deficiency, unspecified: Secondary | ICD-10-CM

## 2016-06-23 DIAGNOSIS — E039 Hypothyroidism, unspecified: Secondary | ICD-10-CM

## 2016-06-23 DIAGNOSIS — D509 Iron deficiency anemia, unspecified: Secondary | ICD-10-CM

## 2016-06-23 DIAGNOSIS — K219 Gastro-esophageal reflux disease without esophagitis: Secondary | ICD-10-CM

## 2016-06-23 DIAGNOSIS — Z86718 Personal history of other venous thrombosis and embolism: Secondary | ICD-10-CM

## 2016-06-23 DIAGNOSIS — I10 Essential (primary) hypertension: Secondary | ICD-10-CM

## 2016-06-23 MED ORDER — AMPHETAMINE-DEXTROAMPHETAMINE 20 MG PO TABS: ORAL_TABLET | ORAL | 0 refills | Status: DC

## 2016-06-23 MED ORDER — ESTROGENS, CONJUGATED 0.625 MG/GM VA CREA: 1.0000 | TOPICAL_CREAM | Freq: Every day | VAGINAL | 2 refills | Status: DC

## 2016-06-23 MED ORDER — KETOCONAZOLE 2 % EX CREA: 1.0000 "application " | TOPICAL_CREAM | Freq: Two times a day (BID) | CUTANEOUS | 0 refills | Status: DC

## 2016-06-23 MED ORDER — ESCITALOPRAM OXALATE 10 MG PO TABS: 10.0000 mg | ORAL_TABLET | Freq: Every day | ORAL | 2 refills | Status: DC

## 2016-06-23 MED ORDER — TRIAMCINOLONE ACETONIDE 0.1 % EX OINT: 1.0000 "application " | TOPICAL_OINTMENT | Freq: Two times a day (BID) | CUTANEOUS | 1 refills | Status: DC

## 2016-06-23 MED ORDER — ALPRAZOLAM 1 MG PO TABS: ORAL_TABLET | ORAL | 0 refills | Status: DC

## 2016-06-23 NOTE — Patient Instructions (Signed)
I think it is possible that you have sleep apnea. It can cause interrupted sleep, headaches, frequent awakenings, fatigue, dry mouth, fast/slow heart beats, memory issues, anxiety/depression, swelling, numbness tingling hands/feet, weight gain, shortness of breath, and the list goes on. Sleep apnea needs to be ruled out because if it is left untreated it does eventually lead to abnormal heart beats, lung failure or heart failure as well as increasing the risk of heart attack and stroke. There are masks you can wear OR a mouth piece that I can give you information about. Often times though people feel MUCH better after getting treatment.   Sleep Apnea  Sleep apnea is a sleep disorder characterized by abnormal pauses in breathing while you sleep. When your breathing pauses, the level of oxygen in your blood decreases. This causes you to move out of deep sleep and into light sleep. As a result, your quality of sleep is poor, and the system that carries your blood throughout your body (cardiovascular system) experiences stress. If sleep apnea remains untreated, the following conditions can develop:  High blood pressure (hypertension).  Coronary artery disease.  Inability to achieve or maintain an erection (impotence).  Impairment of your thought process (cognitive dysfunction). There are three types of sleep apnea: 1. Obstructive sleep apnea--Pauses in breathing during sleep because of a blocked airway. 2. Central sleep apnea--Pauses in breathing during sleep because the area of the brain that controls your breathing does not send the correct signals to the muscles that control breathing. 3. Mixed sleep apnea--A combination of both obstructive and central sleep apnea.  RISK FACTORS The following risk factors can increase your risk of developing sleep apnea:  Being overweight.  Smoking.  Having narrow passages in your nose and throat.  Being of older age.  Being female.  Alcohol use.   Sedative and tranquilizer use.  Ethnicity. Among individuals younger than 35 years, African Americans are at increased risk of sleep apnea. SYMPTOMS   Difficulty staying asleep.  Daytime sleepiness and fatigue.  Loss of energy.  Irritability.  Loud, heavy snoring.  Morning headaches.  Trouble concentrating.  Forgetfulness.  Decreased interest in sex. DIAGNOSIS  In order to diagnose sleep apnea, your caregiver will perform a physical examination. Your caregiver may suggest that you take a home sleep test. Your caregiver may also recommend that you spend the night in a sleep lab. In the sleep lab, several monitors record information about your heart, lungs, and brain while you sleep. Your leg and arm movements and blood oxygen level are also recorded. TREATMENT The following actions may help to resolve mild sleep apnea:  Sleeping on your side.   Using a decongestant if you have nasal congestion.   Avoiding the use of depressants, including alcohol, sedatives, and narcotics.   Losing weight and modifying your diet if you are overweight. There also are devices and treatments to help open your airway:  Oral appliances. These are custom-made mouthpieces that shift your lower jaw forward and slightly open your bite. This opens your airway.  Devices that create positive airway pressure. This positive pressure "splints" your airway open to help you breathe better during sleep. The following devices create positive airway pressure:  Continuous positive airway pressure (CPAP) device. The CPAP device creates a continuous level of air pressure with an air pump. The air is delivered to your airway through a mask while you sleep. This continuous pressure keeps your airway open.  Nasal expiratory positive airway pressure (EPAP) device. The EPAP device   creates positive air pressure as you exhale. The device consists of single-use valves, which are inserted into each nostril and held in  place by adhesive. The valves create very little resistance when you inhale but create much more resistance when you exhale. That increased resistance creates the positive airway pressure. This positive pressure while you exhale keeps your airway open, making it easier to breath when you inhale again.  Bilevel positive airway pressure (BPAP) device. The BPAP device is used mainly in patients with central sleep apnea. This device is similar to the CPAP device because it also uses an air pump to deliver continuous air pressure through a mask. However, with the BPAP machine, the pressure is set at two different levels. The pressure when you exhale is lower than the pressure when you inhale.  Surgery. Typically, surgery is only done if you cannot comply with less invasive treatments or if the less invasive treatments do not improve your condition. Surgery involves removing excess tissue in your airway to create a wider passage way. Document Released: 01/28/2002 Document Revised: 06/04/2012 Document Reviewed: 06/16/2011 [employer] Patient Information 2015 [employer]. This information is not intended to replace advice given to you by your health care provider. Make sure you discuss any questions you have with your health care provider.     

## 2016-06-24 ENCOUNTER — Encounter: Payer: Self-pay | Admitting: Physician Assistant

## 2016-06-24 LAB — LIPID PANEL
Cholesterol: 194 mg/dL (ref <200)
HDL: 47 mg/dL — ABNORMAL LOW (ref >50)
LDL Cholesterol: 108 mg/dL — ABNORMAL HIGH (ref <100)
Total CHOL/HDL Ratio: 4.1 Ratio (ref <5.0)
Triglycerides: 196 mg/dL — ABNORMAL HIGH (ref <150)
VLDL: 39 mg/dL — ABNORMAL HIGH (ref <30)

## 2016-06-24 LAB — TSH: TSH: 0.94 mIU/L

## 2016-07-08 ENCOUNTER — Encounter: Payer: Self-pay | Admitting: Physician Assistant

## 2016-07-25 ENCOUNTER — Encounter: Payer: Self-pay | Admitting: Physician Assistant

## 2016-08-15 ENCOUNTER — Encounter: Payer: Self-pay | Admitting: Physician Assistant

## 2016-08-15 ENCOUNTER — Other Ambulatory Visit: Payer: Self-pay | Admitting: Physician Assistant

## 2016-08-15 DIAGNOSIS — F988 Other specified behavioral and emotional disorders with onset usually occurring in childhood and adolescence: Secondary | ICD-10-CM

## 2016-08-15 MED ORDER — AMPHETAMINE-DEXTROAMPHETAMINE 20 MG PO TABS: ORAL_TABLET | ORAL | 0 refills | Status: DC

## 2016-08-19 ENCOUNTER — Ambulatory Visit: Payer: Self-pay | Admitting: Internal Medicine

## 2016-09-14 ENCOUNTER — Other Ambulatory Visit: Payer: Self-pay | Admitting: Physician Assistant

## 2016-10-15 ENCOUNTER — Other Ambulatory Visit: Payer: Self-pay | Admitting: Physician Assistant

## 2016-10-17 ENCOUNTER — Other Ambulatory Visit: Payer: Self-pay | Admitting: Physician Assistant

## 2016-10-17 DIAGNOSIS — F419 Anxiety disorder, unspecified: Secondary | ICD-10-CM

## 2016-10-17 DIAGNOSIS — F988 Other specified behavioral and emotional disorders with onset usually occurring in childhood and adolescence: Secondary | ICD-10-CM

## 2016-10-17 MED ORDER — AMPHETAMINE-DEXTROAMPHETAMINE 20 MG PO TABS: ORAL_TABLET | ORAL | 0 refills | Status: DC

## 2016-10-17 MED ORDER — ALPRAZOLAM 1 MG PO TABS: ORAL_TABLET | ORAL | 1 refills | Status: DC

## 2016-11-23 ENCOUNTER — Encounter: Payer: Self-pay | Admitting: Physician Assistant

## 2016-11-23 ENCOUNTER — Ambulatory Visit (INDEPENDENT_AMBULATORY_CARE_PROVIDER_SITE_OTHER): Payer: BLUE CROSS/BLUE SHIELD | Admitting: Physician Assistant

## 2016-11-23 VITALS — BP 124/86 | HR 92 | Temp 97.3°F | Resp 16 | Ht 62.0 in | Wt 154.0 lb

## 2016-11-23 DIAGNOSIS — L219 Seborrheic dermatitis, unspecified: Secondary | ICD-10-CM | POA: Diagnosis not present

## 2016-11-23 DIAGNOSIS — I10 Essential (primary) hypertension: Secondary | ICD-10-CM

## 2016-11-23 DIAGNOSIS — I889 Nonspecific lymphadenitis, unspecified: Secondary | ICD-10-CM | POA: Diagnosis not present

## 2016-11-23 DIAGNOSIS — E039 Hypothyroidism, unspecified: Secondary | ICD-10-CM

## 2016-11-23 MED ORDER — CLOBETASOL PROPIONATE 0.05 % EX SOLN: 1.0000 "application " | Freq: Two times a day (BID) | CUTANEOUS | 0 refills | Status: DC

## 2016-11-23 NOTE — Progress Notes (Signed)
Follow up  Assessment and Plan:  Preauricular lymphadenitis -     US Soft Tissue Head/Neck; Future  Seborrheic dermatitis of scalp -     clobetasol (TEMOVATE) 0.05 % external solution; Apply 1 application topically 2 (two) times daily.  Essential hypertension -     CBC with Differential/Platelet -     BASIC METABOLIC PANEL WITH GFR -     Hepatic function panel  Hypothyroidism, unspecified type -     TSH   Discussed med's effects and SE's. Screening labs and tests as requested with regular follow-up as recommended. Future Appointments Date Time Provider Department Center  12/26/2016 10:30 AM Quentin Mulling, PA-C [facility] None  06/27/2017 10:00 AM Quentin Mulling, PA-C [facility] None   HPI 66 y.o. female  Presents for follow up and knot on head.  Her blood pressure has been controlled at home, HR and BP better with switch to losartan, today their BP is BP: 124/86  Behind left ear x 1 week has tender bump that is scaly, has had scaling on her scalp x 2 months, using T gel.  She does not workout but she walks a lot for her job. She denies chest pain, shortness of breath, dizziness.  She is not on cholesterol medication and denies myalgias. Her cholesterol is at goal. The cholesterol last visit was:   Lab Results  Component Value Date   CHOL 194 06/23/2016   HDL 47 (L) 06/23/2016   LDLCALC 108 (H) 06/23/2016   LDLDIRECT 138.2 04/25/2008   TRIG 196 (H) 06/23/2016   CHOLHDL 4.1 06/23/2016   She has been working on diet and exercise for prediabetes, and denies paresthesia of the feet, polydipsia and polyuria. Last A1C in the office was: Lab Results  Component Value Date   HGBA1C 5.5 06/23/2015  Patient is on Vitamin D supplement, 10,000.   Lab Results  Component Value Date   VD25OH 28 (L) 11/19/2014  She is on the adderall, 1/2 tablet 1-2 x a day mainly when she drives.  She is on thyroid medication. Her medication was changed last visit, she is on 1 pill 1 day a week but  1/2 other days. Lab Results  Component Value Date   TSH 0.94 06/23/2016   BMI is Body mass index is 28.17 kg/m., she is working on diet and exercise. Wt Readings from Last 3 Encounters:  11/23/16 154 lb (69.9 kg)  06/23/16 158 lb 12.8 oz (72 kg)  05/23/16 160 lb 12.8 oz (72.9 kg)      Current Medications:  Current Outpatient Prescriptions on File Prior to Visit  Medication Sig Dispense Refill  . ALPRAZolam (XANAX) 1 MG tablet 1/2-1 tablet as needed for sleep/anxiety 30 tablet 1  . amphetamine-dextroamphetamine (ADDERALL) 20 MG tablet Take 1/2 to 1 tablet if needed for ADD 30 tablet 0  . conjugated estrogens (PREMARIN) vaginal cream Place 1 Applicatorful vaginally daily. 30 g 2  . EPINEPHrine 0.15 MG/0.15ML IJ injection Inject 0.15 mLs (0.15 mg total) into the muscle as needed for anaphylaxis. 1 Device 2  . escitalopram (LEXAPRO) 10 MG tablet Take 1 tablet (10 mg total) by mouth daily. 30 tablet 2  . ketoconazole (NIZORAL) 2 % cream Apply 1 application topically 2 (two) times daily. 15 g 0  . lidocaine (LIDODERM) 5 % Place 1 patch onto the skin daily. Remove & Discard patch within 12 hours or as directed by MD 30 patch 3  . meclizine (ANTIVERT) 25 MG tablet 1/2-1 pill up to 3 times  daily for motion sickness/dizziness 30 tablet 0  . meloxicam (MOBIC) 15 MG tablet TAKE 1 TABLET (15 MG TOTAL) BY MOUTH DAILY. 90 tablet 0  . ranitidine (ZANTAC) 300 MG tablet TAKE 1 TABLET BY MOUTH TWICE A DAY WHILE TRYING TO GET OFF OF PPI, THEN CAN USE 1 TABLET AT NIGHT. 60 tablet 2  . SYNTHROID 125 MCG tablet Take 1 tablet (125 mcg total) by mouth daily before breakfast. 90 tablet 1  . triamcinolone ointment (KENALOG) 0.1 % Apply 1 application topically 2 (two) times daily. 80 g 1  . Vitamin D, Ergocalciferol, (DRISDOL) 50000 units CAPS capsule TAKE 1 CAPSULE BY MOUTH 3 DAYS A WEEK. 12 capsule 1   No current facility-administered medications on file prior to visit.     Medical History:  Past Medical  History:  Diagnosis Date  . ADD (attention deficit disorder)   . Anxiety   . Depression   . GERD (gastroesophageal reflux disease)   . Hyperlipidemia   . Hypertension   . Hypothyroid   . PONV (postoperative nausea and vomiting)   . Vitamin D deficiency    Allergies Allergies  Allergen Reactions  . Cefprozil   . Codeine     REACTION: vomiting  . Hydrocodone-Acetaminophen   . Levofloxacin   . Morphine   . Pravastatin Swelling    Review of Systems  Constitutional: Positive for malaise/fatigue. Negative for chills, diaphoresis, fever and weight loss.  HENT: Negative.   Eyes: Negative.   Respiratory: Negative.   Cardiovascular: Negative for chest pain, palpitations, orthopnea, claudication, leg swelling and PND.  Gastrointestinal: Positive for heartburn. Negative for abdominal pain, blood in stool, constipation, diarrhea, melena, nausea and vomiting.  Genitourinary: Positive for frequency. Negative for dysuria, flank pain, hematuria and urgency.       Vaginal dryness  Musculoskeletal: Negative for back pain, falls, joint pain, myalgias and neck pain.  Skin: Negative for itching and rash.  Neurological: Negative.  Negative for dizziness and weakness.  Psychiatric/Behavioral: Negative for depression, hallucinations, memory loss, substance abuse and suicidal ideas. The patient is nervous/anxious. The patient does not have insomnia.     Physical Exam: Estimated body mass index is 28.17 kg/m as calculated from the following:   Height as of this encounter:  (1.575 m).   Weight as of this encounter: 154 lb (69.9 kg). BP 124/86   Pulse 92   Temp (!) 97.3 F (36.3 C)   Resp 16   Ht  (1.575 m)   Wt 154 lb (69.9 kg)   SpO2 94%   BMI 28.17 kg/m  General Appearance: Well nourished, in no apparent distress. Eyes: PERRLA, EOMs, conjunctiva no swelling or erythema, normal fundi and vessels. Sinuses: No Frontal/maxillary tenderness ENT/Mouth: Ext aud canals clear, normal  light reflex with TMs without erythema, bulging.  Good dentition. No erythema, swelling, or exudate on post pharynx. Tonsils not swollen or erythematous. Hearing normal.  Neck: Supple, thyroid normal. No bruits Respiratory: Respiratory effort normal, BS equal bilaterally without rales, rhonchi, wheezing or stridor. Cardio: RRR without murmurs, rubs or gallops. Brisk peripheral pulses without edema.  Chest: symmetric, with normal excursions and percussion. Abdomen: Soft, +BS. Non tender, no guarding, rebound, hernias, masses, or organomegaly. .  Lymphatics: prearicular nodule/lymphnode, no warmth, now swelling no other lymphadenopathy.  Musculoskeletal: Full ROM all peripheral extremities,5/5 strength, and normal gait. Skin: right posterior calf presents with non erythematous scaly macule  No lesions, ecchymosis.  Neuro: Cranial nerves intact, reflexes equal bilaterally. Normal muscle tone,  no cerebellar symptoms. Sensation intact.  Psych: Awake and oriented X 3, normal affect, Insight and Judgment appropriate.      Quentin Mulling 9:01 AM

## 2016-11-23 NOTE — Patient Instructions (Signed)
Monitor your blood pressure at home. Go to the ER if any CP, SOB, nausea, dizziness, severe HA, changes vision/speech  Goal BP:  For patients younger than 60: Goal BP < 140/90. For patients 60 and older: Goal BP < 150/90. For patients with diabetes: Goal BP < 140/90. Your most recent BP: BP: 124/86   Take your medications faithfully as instructed. Maintain a healthy weight. Get at least 150 minutes of aerobic exercise per week. Minimize salt intake. Minimize alcohol intake  DASH Eating Plan DASH stands for "Dietary Approaches to Stop Hypertension." The DASH eating plan is a healthy eating plan that has been shown to reduce high blood pressure (hypertension). Additional health benefits may include reducing the risk of type 2 diabetes mellitus, heart disease, and stroke. The DASH eating plan may also help with weight loss. WHAT DO I NEED TO KNOW ABOUT THE DASH EATING PLAN? For the DASH eating plan, you will follow these general guidelines:  Choose foods with a percent daily value for sodium of less than 5% (as listed on the food label).  Use salt-free seasonings or herbs instead of table salt or sea salt.  Check with your health care provider or pharmacist before using salt substitutes.  Eat lower-sodium products, often labeled as "lower sodium" or "no salt added."  Eat fresh foods.  Eat more vegetables, fruits, and low-fat dairy products.  Choose whole grains. Look for the word "whole" as the first word in the ingredient list.  Choose fish and skinless chicken or [place] more often than red meat. Limit fish, poultry, and meat to 6 oz (170 g) each day.  Limit sweets, desserts, sugars, and sugary drinks.  Choose heart-healthy fats.  Limit cheese to 1 oz (28 g) per day.  Eat more home-cooked food and less restaurant, buffet, and fast food.  Limit fried foods.  Cook foods using methods other than frying.  Limit canned vegetables. If you do use them, rinse them well to  decrease the sodium.  When eating at a restaurant, ask that your food be prepared with less salt, or no salt if possible. WHAT FOODS CAN I EAT? Seek help from a dietitian for individual calorie needs. Grains Whole grain or whole wheat bread. Brown rice. Whole grain or whole wheat pasta. Quinoa, bulgur, and whole grain cereals. Low-sodium cereals. Corn or whole wheat flour tortillas. Whole grain cornbread. Whole grain crackers. Low-sodium crackers. Vegetables Fresh or frozen vegetables (raw, steamed, roasted, or grilled). Low-sodium or reduced-sodium tomato and vegetable juices. Low-sodium or reduced-sodium tomato sauce and paste. Low-sodium or reduced-sodium canned vegetables.  Fruits All fresh, canned (in natural juice), or frozen fruits. Meat and Other Protein Products Ground beef (85% or leaner), grass-fed beef, or beef trimmed of fat. Skinless chicken or [place]. Ground chicken or [place]. Pork trimmed of fat. All fish and seafood. Eggs. Dried beans, peas, or lentils. Unsalted nuts and seeds. Unsalted canned beans. Dairy Low-fat dairy products, such as skim or 1% milk, 2% or reduced-fat cheeses, low-fat ricotta or cottage cheese, or plain low-fat yogurt. Low-sodium or reduced-sodium cheeses. Fats and Oils Tub margarines without trans fats. Light or reduced-fat mayonnaise and salad dressings (reduced sodium). Avocado. Safflower, olive, or canola oils. Natural peanut or almond butter. Other Unsalted popcorn and pretzels. The items listed above may not be a complete list of recommended foods or beverages. Contact your dietitian for more options. WHAT FOODS ARE NOT RECOMMENDED? Grains White bread. White pasta. White rice. Refined cornbread. Bagels and croissants. Crackers that contain trans  fat. Vegetables Creamed or fried vegetables. Vegetables in a cheese sauce. Regular canned vegetables. Regular canned tomato sauce and paste. Regular tomato and vegetable juices. Fruits Dried fruits. Canned  fruit in light or heavy syrup. Fruit juice. Meat and Other Protein Products Fatty cuts of meat. Ribs, chicken wings, bacon, sausage, bologna, salami, chitterlings, fatback, hot dogs, bratwurst, and packaged luncheon meats. Salted nuts and seeds. Canned beans with salt. Dairy Whole or 2% milk, cream, half-and-half, and cream cheese. Whole-fat or sweetened yogurt. Full-fat cheeses or blue cheese. Nondairy creamers and whipped toppings. Processed cheese, cheese spreads, or cheese curds. Condiments Onion and garlic salt, seasoned salt, table salt, and sea salt. Canned and packaged gravies. Worcestershire sauce. Tartar sauce. Barbecue sauce. Teriyaki sauce. Soy sauce, including reduced sodium. Steak sauce. Fish sauce. Oyster sauce. Cocktail sauce. Horseradish. Ketchup and mustard. Meat flavorings and tenderizers. Bouillon cubes. Hot sauce. Tabasco sauce. Marinades. Taco seasonings. Relishes. Fats and Oils Butter, stick margarine, lard, shortening, ghee, and bacon fat. Coconut, palm kernel, or palm oils. Regular salad dressings. Other Pickles and olives. Salted popcorn and pretzels. The items listed above may not be a complete list of foods and beverages to avoid. Contact your dietitian for more information. WHERE CAN I FIND MORE INFORMATION? National Heart, [employer], and Blood Institute: travelstabloid.com Document Released: 01/27/2011 Document Revised: 06/24/2013 Document Reviewed: 12/12/2012 [facility] Patient Information 2015 [place], [place]. This information is not intended to replace advice given to you by your health care provider. Make sure you discuss any questions you have with your health care provider.

## 2016-11-24 LAB — CBC WITH DIFFERENTIAL/PLATELET
Basophils Absolute: 63 cells/uL (ref 0–200)
Basophils Relative: 1 %
Eosinophils Absolute: 252 cells/uL (ref 15–500)
Eosinophils Relative: 4 %
HCT: 46.5 % — ABNORMAL HIGH (ref 35.0–45.0)
Hemoglobin: 15.5 g/dL (ref 11.7–15.5)
Lymphs Abs: 2010 cells/uL (ref 850–3900)
MCH: 29.3 pg (ref 27.0–33.0)
MCHC: 33.3 g/dL (ref 32.0–36.0)
MCV: 87.9 fL (ref 80.0–100.0)
MPV: 10 fL (ref 7.5–12.5)
Monocytes Relative: 7.9 %
Neutro Abs: 3478 cells/uL (ref 1500–7800)
Neutrophils Relative %: 55.2 %
Platelets: 305 10*3/uL (ref 140–400)
RBC: 5.29 10*6/uL — ABNORMAL HIGH (ref 3.80–5.10)
RDW: 12.7 % (ref 11.0–15.0)
Total Lymphocyte: 31.9 %
WBC mixed population: 498 cells/uL (ref 200–950)
WBC: 6.3 10*3/uL (ref 3.8–10.8)

## 2016-11-24 LAB — HEPATIC FUNCTION PANEL
AG Ratio: 2 (calc) (ref 1.0–2.5)
ALT: 17 U/L (ref 6–29)
AST: 17 U/L (ref 10–35)
Albumin: 4.3 g/dL (ref 3.6–5.1)
Alkaline phosphatase (APISO): 82 U/L (ref 33–130)
Bilirubin, Direct: 0.1 mg/dL (ref 0.0–0.2)
Globulin: 2.2 g/dL (calc) (ref 1.9–3.7)
Indirect Bilirubin: 0.5 mg/dL (calc) (ref 0.2–1.2)
Total Bilirubin: 0.6 mg/dL (ref 0.2–1.2)
Total Protein: 6.5 g/dL (ref 6.1–8.1)

## 2016-11-24 LAB — BASIC METABOLIC PANEL WITH GFR
BUN: 15 mg/dL (ref 7–25)
CO2: 29 mmol/L (ref 20–32)
Calcium: 9.4 mg/dL (ref 8.6–10.4)
Chloride: 101 mmol/L (ref 98–110)
Creat: 0.91 mg/dL (ref 0.50–0.99)
GFR, Est African American: 77 mL/min/{1.73_m2} (ref >=60)
GFR, Est Non African American: 67 mL/min/{1.73_m2} (ref >=60)
Glucose, Bld: 91 mg/dL (ref 65–99)
Potassium: 4.4 mmol/L (ref 3.5–5.3)
Sodium: 138 mmol/L (ref 135–146)

## 2016-11-24 LAB — TSH: TSH: 6.18 mIU/L — ABNORMAL HIGH (ref 0.40–4.50)

## 2016-11-29 ENCOUNTER — Ambulatory Visit
Admission: RE | Admit: 2016-11-29 | Discharge: 2016-11-29 | Disposition: A | Discharge status: Home / Self Care | Payer: BLUE CROSS/BLUE SHIELD | Source: Ambulatory Visit | Attending: Physician Assistant | Admitting: Physician Assistant

## 2016-11-29 DIAGNOSIS — I889 Nonspecific lymphadenitis, unspecified: Secondary | ICD-10-CM

## 2016-12-03 ENCOUNTER — Other Ambulatory Visit: Payer: Self-pay | Admitting: Physician Assistant

## 2016-12-03 DIAGNOSIS — J309 Allergic rhinitis, unspecified: Secondary | ICD-10-CM

## 2016-12-26 ENCOUNTER — Encounter: Payer: Self-pay | Admitting: Physician Assistant

## 2016-12-26 ENCOUNTER — Ambulatory Visit: Payer: BLUE CROSS/BLUE SHIELD | Admitting: Physician Assistant

## 2016-12-26 VITALS — BP 122/78 | HR 75 | Temp 97.7°F | Resp 14 | Ht 62.0 in | Wt 155.2 lb

## 2016-12-26 DIAGNOSIS — F419 Anxiety disorder, unspecified: Secondary | ICD-10-CM

## 2016-12-26 DIAGNOSIS — Z79899 Other long term (current) drug therapy: Secondary | ICD-10-CM | POA: Diagnosis not present

## 2016-12-26 DIAGNOSIS — R5383 Other fatigue: Secondary | ICD-10-CM | POA: Diagnosis not present

## 2016-12-26 DIAGNOSIS — L821 Other seborrheic keratosis: Secondary | ICD-10-CM | POA: Diagnosis not present

## 2016-12-26 DIAGNOSIS — F988 Other specified behavioral and emotional disorders with onset usually occurring in childhood and adolescence: Secondary | ICD-10-CM

## 2016-12-26 DIAGNOSIS — E039 Hypothyroidism, unspecified: Secondary | ICD-10-CM | POA: Diagnosis not present

## 2016-12-26 LAB — TSH: TSH: 3.62 mIU/L (ref 0.40–4.50)

## 2016-12-26 MED ORDER — AMPHETAMINE-DEXTROAMPHETAMINE 20 MG PO TABS: ORAL_TABLET | ORAL | 0 refills | Status: DC

## 2016-12-26 MED ORDER — ALPRAZOLAM 1 MG PO TABS: ORAL_TABLET | ORAL | 1 refills | Status: DC

## 2016-12-26 NOTE — Progress Notes (Signed)
Assessment and Plan:  Hypertension -Continue medication, monitor blood pressure at home. Continue DASH diet.  Reminder to go to the ER if any CP, SOB, nausea, dizziness, severe HA, changes vision/speech, left arm numbness and tingling and jaw pain.  Cholesterol -Continue diet and exercise. Check cholesterol.     Hypothyroidism -check TSH level, continue medications the same, reminded to take on an empty stomach 30-7360mins before food.    Seb Keratosis 3 freeze and thaw technique   Fatigue, has had negative work up Likely from depression/anxiety Only on lexapro 5 mg, will try to go up to 10 mg Has tried wellbutri, celexa, zoloft, prozac, effexor but did not do well with it  Continue diet and meds as discussed. Further disposition pending results of labs. Over 30 minutes of exam, counseling, chart review, and critical decision making was performed  Future Appointments  Date Time Provider Department Center  06/27/2017 10:00 AM Quentin Mullingollier, Devine Dant, PA-C [facility] None     HPI 66 y.o. female  presents for 6 month follow up on hypertension, cholesterol, prediabetes, and vitamin D deficiency.   Her blood pressure has been controlled at home, today their BP is BP: 122/78  She does not workout. She denies chest pain, shortness of breath, dizziness.  She is on cholesterol medication and denies myalgias. Her cholesterol is not at goal. The cholesterol last visit was:   Lab Results  Component Value Date   CHOL 194 06/23/2016   HDL 47 (L) 06/23/2016   LDLCALC 108 (H) 06/23/2016   LDLDIRECT 138.2 04/25/2008   TRIG 196 (H) 06/23/2016   CHOLHDL 4.1 06/23/2016   Last V2ZA1C in the office was:  Lab Results  Component Value Date   HGBA1C 5.5 06/23/2015   Patient is on Vitamin D supplement.   Lab Results  Component Value Date   VD25OH 28 (L) 11/19/2014     She is on thyroid medication. Her medication was changed last visit, she is on 1/2 pill every day but on 1 pill 2 days a week.   Lab  Results  Component Value Date   TSH 6.18 (H) 11/23/2016  .  BMI is Body mass index is 28.39 kg/m., she is working on diet and exercise. Wt Readings from Last 3 Encounters:  12/26/16 155 lb 3.2 oz (70.4 kg)  11/23/16 154 lb (69.9 kg)  06/23/16 158 lb 12.8 oz (72 kg)    Current Medications:  Current Outpatient Medications on File Prior to Visit  Medication Sig Dispense Refill  . ALPRAZolam (XANAX) 1 MG tablet 1/2-1 tablet as needed for sleep/anxiety 30 tablet 1  . clobetasol (TEMOVATE) 0.05 % external solution Apply 1 application topically 2 (two) times daily. 50 mL 0  . conjugated estrogens (PREMARIN) vaginal cream Place 1 Applicatorful vaginally daily. 30 g 2  . EPINEPHrine 0.15 MG/0.15ML IJ injection Inject 0.15 mLs (0.15 mg total) into the muscle as needed for anaphylaxis. 1 Device 2  . escitalopram (LEXAPRO) 10 MG tablet Take 1 tablet (10 mg total) by mouth daily. 30 tablet 2  . ketoconazole (NIZORAL) 2 % cream APPLY TO AFFECTED AREA(S) TWO TIMES A DAY 60 g 3  . lidocaine (LIDODERM) 5 % Place 1 patch onto the skin daily. Remove & Discard patch within 12 hours or as directed by MD 30 patch 3  . meclizine (ANTIVERT) 25 MG tablet 1/2-1 pill up to 3 times daily for motion sickness/dizziness 30 tablet 0  . meloxicam (MOBIC) 15 MG tablet TAKE 1 TABLET (15 MG TOTAL) BY  MOUTH DAILY. 90 tablet 0  . ranitidine (ZANTAC) 300 MG tablet TAKE 1 TABLET BY MOUTH TWICE A DAY WHILE TRYING TO GET OFF OF PPI, THEN CAN USE 1 TABLET AT NIGHT. 60 tablet 2  . SYNTHROID 125 MCG tablet Take 1 tablet (125 mcg total) by mouth daily before breakfast. 90 tablet 1  . triamcinolone ointment (KENALOG) 0.1 % Apply 1 application topically 2 (two) times daily. 80 g 1  . Vitamin D, Ergocalciferol, (DRISDOL) 50000 units CAPS capsule TAKE 1 CAPSULE BY MOUTH 3 DAYS A WEEK. 12 capsule 1  . amphetamine-dextroamphetamine (ADDERALL) 20 MG tablet Take 1/2 to 1 tablet if needed for ADD 30 tablet 0   No current  facility-administered medications on file prior to visit.    Medical History:  Past Medical History:  Diagnosis Date  . ADD (attention deficit disorder)   . Anxiety   . Depression   . GERD (gastroesophageal reflux disease)   . Hyperlipidemia   . Hypertension   . Hypothyroid   . PONV (postoperative nausea and vomiting)   . Vitamin D deficiency    Allergies:  Allergies  Allergen Reactions  . Cefprozil   . Codeine     REACTION: vomiting  . Hydrocodone-Acetaminophen   . Levofloxacin   . Morphine   . Pravastatin Swelling     Review of Systems:  Review of Systems  Constitutional: Negative.   HENT: Negative.   Eyes: Negative.   Respiratory: Negative.   Cardiovascular: Negative.   Gastrointestinal: Negative.   Genitourinary: Negative.   Musculoskeletal: Negative.   Skin: Positive for rash. Negative for itching.  Neurological: Negative.   Endo/Heme/Allergies: Negative.   Psychiatric/Behavioral: Negative.     Family history- Review and unchanged Social history- Review and unchanged Physical Exam: BP 122/78   Pulse 75   Temp 97.7 F (36.5 C)   Resp 14   Ht 5\' 2"  (1.575 m)   Wt 155 lb 3.2 oz (70.4 kg)   SpO2 98%   BMI 28.39 kg/m  Wt Readings from Last 3 Encounters:  12/26/16 155 lb 3.2 oz (70.4 kg)  11/23/16 154 lb (69.9 kg)  06/23/16 158 lb 12.8 oz (72 kg)   General Appearance: Well nourished, in no apparent distress. Eyes: PERRLA, EOMs, conjunctiva no swelling or erythema Sinuses: No Frontal/maxillary tenderness ENT/Mouth: Ext aud canals clear, TMs without erythema, bulging. No erythema, swelling, or exudate on post pharynx.  Tonsils not swollen or erythematous. Hearing normal.  Neck: Supple, thyroid normal.  Respiratory: Respiratory effort normal, BS equal bilaterally without rales, rhonchi, wheezing or stridor.  Cardio: RRR with no MRGs. Brisk peripheral pulses without edema.  Abdomen: Soft, + BS,  Non tender, no guarding, rebound, hernias,  masses. Lymphatics: Non tender without lymphadenopathy.  Musculoskeletal: Full ROM, 5/5 strength, Normal gait Skin: Scaly erythematous patch on right thigh, arms. Warm, dry without rashes, lesions, ecchymosis.  Neuro: Cranial nerves intact. Normal muscle tone, no cerebellar symptoms. Psych: Awake and oriented X 3, normal affect, Insight and Judgment appropriate.    Quentin Mulling, PA-C 11:06 AM [facility]

## 2017-01-10 ENCOUNTER — Other Ambulatory Visit: Payer: Self-pay | Admitting: Physician Assistant

## 2017-01-10 ENCOUNTER — Other Ambulatory Visit: Payer: Self-pay | Admitting: Internal Medicine

## 2017-01-10 DIAGNOSIS — Z1231 Encounter for screening mammogram for malignant neoplasm of breast: Secondary | ICD-10-CM

## 2017-01-31 ENCOUNTER — Other Ambulatory Visit: Payer: Self-pay | Admitting: Physician Assistant

## 2017-01-31 DIAGNOSIS — F988 Other specified behavioral and emotional disorders with onset usually occurring in childhood and adolescence: Secondary | ICD-10-CM

## 2017-01-31 MED ORDER — AMPHETAMINE-DEXTROAMPHETAMINE 20 MG PO TABS: ORAL_TABLET | ORAL | 0 refills | Status: DC

## 2017-02-03 ENCOUNTER — Other Ambulatory Visit: Payer: Self-pay | Admitting: Physician Assistant

## 2017-02-03 DIAGNOSIS — F419 Anxiety disorder, unspecified: Secondary | ICD-10-CM

## 2017-02-09 ENCOUNTER — Ambulatory Visit
Admission: RE | Admit: 2017-02-09 | Discharge: 2017-02-09 | Disposition: A | Discharge status: Home / Self Care | Payer: BLUE CROSS/BLUE SHIELD | Source: Ambulatory Visit | Attending: Internal Medicine | Admitting: Internal Medicine

## 2017-02-09 DIAGNOSIS — Z1231 Encounter for screening mammogram for malignant neoplasm of breast: Secondary | ICD-10-CM

## 2017-03-29 ENCOUNTER — Other Ambulatory Visit: Payer: Self-pay | Admitting: Internal Medicine

## 2017-03-29 DIAGNOSIS — F988 Other specified behavioral and emotional disorders with onset usually occurring in childhood and adolescence: Secondary | ICD-10-CM

## 2017-03-29 MED ORDER — AMPHETAMINE-DEXTROAMPHETAMINE 20 MG PO TABS: ORAL_TABLET | ORAL | 0 refills | Status: DC

## 2017-04-03 ENCOUNTER — Encounter: Payer: Self-pay | Admitting: Physician Assistant

## 2017-05-02 ENCOUNTER — Encounter: Payer: Self-pay | Admitting: Physician Assistant

## 2017-05-02 ENCOUNTER — Ambulatory Visit (INDEPENDENT_AMBULATORY_CARE_PROVIDER_SITE_OTHER): Payer: Medicare Other | Admitting: Physician Assistant

## 2017-05-02 VITALS — BP 126/74 | HR 73 | Temp 97.3°F | Resp 14 | Ht 62.0 in | Wt 159.0 lb

## 2017-05-02 DIAGNOSIS — Z79899 Other long term (current) drug therapy: Secondary | ICD-10-CM

## 2017-05-02 DIAGNOSIS — F3342 Major depressive disorder, recurrent, in full remission: Secondary | ICD-10-CM

## 2017-05-02 DIAGNOSIS — Z0000 Encounter for general adult medical examination without abnormal findings: Secondary | ICD-10-CM

## 2017-05-02 DIAGNOSIS — E039 Hypothyroidism, unspecified: Secondary | ICD-10-CM

## 2017-05-02 DIAGNOSIS — Z23 Encounter for immunization: Secondary | ICD-10-CM | POA: Diagnosis not present

## 2017-05-02 DIAGNOSIS — R531 Weakness: Secondary | ICD-10-CM | POA: Diagnosis not present

## 2017-05-02 DIAGNOSIS — Z86718 Personal history of other venous thrombosis and embolism: Secondary | ICD-10-CM | POA: Diagnosis not present

## 2017-05-02 DIAGNOSIS — I10 Essential (primary) hypertension: Secondary | ICD-10-CM

## 2017-05-02 DIAGNOSIS — E785 Hyperlipidemia, unspecified: Secondary | ICD-10-CM

## 2017-05-02 DIAGNOSIS — M542 Cervicalgia: Secondary | ICD-10-CM | POA: Diagnosis not present

## 2017-05-02 DIAGNOSIS — Z136 Encounter for screening for cardiovascular disorders: Secondary | ICD-10-CM | POA: Diagnosis not present

## 2017-05-02 DIAGNOSIS — Z6829 Body mass index (BMI) 29.0-29.9, adult: Secondary | ICD-10-CM

## 2017-05-02 DIAGNOSIS — R6889 Other general symptoms and signs: Secondary | ICD-10-CM

## 2017-05-02 DIAGNOSIS — Z0001 Encounter for general adult medical examination with abnormal findings: Secondary | ICD-10-CM

## 2017-05-02 MED ORDER — DEXAMETHASONE SODIUM PHOSPHATE 100 MG/10ML IJ SOLN
10.0000 mg | Freq: Once | INTRAMUSCULAR | Status: AC
  Administered 2017-05-02: 10 mg via INTRAMUSCULAR

## 2017-05-02 MED ORDER — MELOXICAM 15 MG PO TABS: ORAL_TABLET | ORAL | 1 refills | Status: DC

## 2017-05-02 NOTE — Progress Notes (Signed)
MEDICARE ANNUAL WELLNESS VISIT AND FOLLOW UP  Assessment:   BMI 29.0-29.9,adult  Overweight  - long discussion about weight loss, diet, and exercise -recommended diet heavy in fruits and veggies and low in animal meats, cheeses, and dairy products  Need for prophylactic vaccination against Streptococcus pneumoniae (pneumococcus) -     Pneumococcal conjugate vaccine 13-valent IM  Essential hypertension - continue medications, DASH diet, exercise and monitor at home. Call if greater than 130/80.  -     US, RETROPERITNL ABD,  LTD -     CBC with Differential/Platelet -     BASIC METABOLIC PANEL WITH GFR -     Hepatic function panel -     TSH  Hypothyroidism, unspecified type Hypothyroidism-check TSH level, continue medications the same, reminded to take on an empty stomach 30-3360mins before food.  -     TSH  PULMONARY EMBOLISM, HX OF Monitor, was provoked after breaking leg  Hyperlipidemia, unspecified hyperlipidemia type -continue medications, check lipids, decrease fatty foods, increase activity.   Recurrent major depressive disorder, in full remission (HCC) - continue medications, stress management techniques discussed, increase water, good sleep hygiene discussed, increase exercise, and increase veggies.   Medication management -     Magnesium  Neck pain on right side Very mechanical Verbal consent obtained, risk and benefits were addressed with patient. A trigger point injection was performed at the site of maximal tenderness using 1% plain Lidocaine and Dexamethasone. This was well tolerated, and followed by immediate relief of pain. Return precautions discussed with the patient.  -     EKG 12-Lead -     DG Cervical Spine Complete; Future -     meloxicam (MOBIC) 15 MG tablet; Take one daily with food for 2 weeks, can take with tylenol, can not take with aleve, iburpofen, then as needed daily for pain -     dexamethasone (DECADRON) injection 10 mg  Weakness -     EKG  12-Lead -     DG Cervical Spine Complete; Future - check labs  The patient was advised to call immediately if she has any concerning symptoms in the interval. The patient voices understanding of current treatment options and is in agreement with the current care plan.The patient knows to call the clinic with any problems, questions or concerns or go to the ER if any further progression of symptoms.    Over 40 minutes of exam, counseling, chart review and critical decision making was performed Future Appointments  Date Time Provider Department Center  11/16/2017 11:00 AM Quentin Mullingollier, Kiaya Haliburton, PA-C [facility] None  05/16/2018 10:45 AM Quentin Mullingollier, Krysteena Stalker, PA-C [facility] None    Plan:   During the course of the visit the patient was educated and counseled about appropriate screening and preventive services including:    Pneumococcal vaccine   Prevnar 13  Influenza vaccine  Td vaccine  Screening electrocardiogram  Bone densitometry screening  Colorectal cancer screening  Diabetes screening  Glaucoma screening  Nutrition counseling   Advanced directives: requested   Subjective:  Diana Wolfe is a 67 y.o. right handed female female who presents for Medicare Annual Wellness Visit and 3 month follow up.   She has had neck pain no known injury but did switch vehicle recently , intermittent x 1 month, worse x 2 weeks. Has nausea, weakness with it. She has "lump" on her right head with pain that radiates down her neck, into her clavicle/shoulder. States went out Sunday to run errands, when she came back she  was very tired, slept for 3 hours. She has had some right sided chest pain under her clavicle, no SOB. Hurts with and without movement, worse with turning head to the right. Will have numbness tingling down to her elbow, no decreased grip/weakness in that hand/arm. She does work with dogs, some pulling on the leash. Has history of lumbar DDD.    Her blood pressure has been  controlled at home, today their BP is BP: 126/74 She does not workout, she is active with her dog. She denies chest pain, shortness of breath, dizziness.  She is not on cholesterol medication and denies myalgias. Her cholesterol is at goal. The cholesterol last visit was:   Lab Results  Component Value Date   CHOL 194 06/23/2016   HDL 47 (L) 06/23/2016   LDLCALC 108 (H) 06/23/2016   LDLDIRECT 138.2 04/25/2008   TRIG 196 (H) 06/23/2016   CHOLHDL 4.1 06/23/2016   She is on the adderall, 1/2 tablet 1-2 x a day mainly when she drives. She has urinary frequency during the day as well, declines incontinence, does have urgency. She does have vaginal atrophy and does premarin 2 x a week.  She has chronic joint pain bilateral feet and left knee, was on NSAIDS but decreased with last GFR being 75.   Last A1C in the office was:  Lab Results  Component Value Date   HGBA1C 5.5 06/23/2015   Last GFR: Lab Results  Component Value Date   GFRNONAA 67 11/23/2016   She is on thyroid medication. Her medication was not changed last visit.   Lab Results  Component Value Date   TSH 3.62 12/26/2016  .  Patient is on Vitamin D supplement.   Lab Results  Component Value Date   VD25OH 28 (L) 11/19/2014     BMI is Body mass index is 29.08 kg/m., she is working on diet and exercise. Wt Readings from Last 3 Encounters:  05/02/17 159 lb (72.1 kg)  12/26/16 155 lb 3.2 oz (70.4 kg)  11/23/16 154 lb (69.9 kg)    Medication Review: Current Outpatient Medications on File Prior to Visit  Medication Sig Dispense Refill  . ALPRAZolam (XANAX) 1 MG tablet 1/2-1 tablet as needed for sleep/anxiety 30 tablet 1  . amphetamine-dextroamphetamine (ADDERALL) 20 MG tablet Take 1/2 to 1 tablet daily if needed for ADD and please try to limit to 5 days /week to avoid addiction Rx to last 6 weeks 30 tablet 0  . clobetasol (TEMOVATE) 0.05 % external solution Apply 1 application topically 2 (two) times daily. 50 mL 0  .  conjugated estrogens (PREMARIN) vaginal cream Place 1 Applicatorful vaginally daily. 30 g 2  . EPINEPHrine 0.15 MG/0.15ML IJ injection Inject 0.15 mLs (0.15 mg total) into the muscle as needed for anaphylaxis. 1 Device 2  . escitalopram (LEXAPRO) 10 MG tablet TAKE ONE TABLET BY MOUTH DAILY 30 tablet 1  . ketoconazole (NIZORAL) 2 % cream APPLY TO AFFECTED AREA(S) TWO TIMES A DAY 60 g 3  . lidocaine (LIDODERM) 5 % Place 1 patch onto the skin daily. Remove & Discard patch within 12 hours or as directed by MD 30 patch 3  . meclizine (ANTIVERT) 25 MG tablet 1/2-1 pill up to 3 times daily for motion sickness/dizziness 30 tablet 0  . ranitidine (ZANTAC) 300 MG tablet TAKE 1 TABLET BY MOUTH TWICE A DAY WHILE TRYING TO GET OFF OF PPI, THEN CAN USE 1 TABLET AT NIGHT. 60 tablet 2  . SYNTHROID 125 MCG  tablet Take 1 tablet (125 mcg total) by mouth daily before breakfast. 90 tablet 1  . triamcinolone ointment (KENALOG) 0.1 % Apply 1 application topically 2 (two) times daily. 80 g 1  . Vitamin D, Ergocalciferol, (DRISDOL) 50000 units CAPS capsule TAKE 1 CAPSULE BY MOUTH 3 DAYS A WEEK. 12 capsule 1   No current facility-administered medications on file prior to visit.     Allergies  Allergen Reactions  . Cefprozil   . Codeine     REACTION: vomiting  . Hydrocodone-Acetaminophen   . Levofloxacin   . Morphine   . Pravastatin Swelling    Current Problems (verified) Patient Active Problem List   Diagnosis Date Noted  . Medication management 06/23/2014  . Prediabetes 03/27/2014  . Vaginal atrophy 03/27/2014  . Hyperlipidemia   . Hypertension   . Anxiety   . GERD (gastroesophageal reflux disease)   . ADD (attention deficit disorder)   . Depression   . Vitamin D deficiency   . Hypothyroidism 04/25/2008  . Iron deficiency anemia 04/25/2008  . Obstructive sleep apnea 11/16/2007  . Allergic rhinitis 11/16/2007  . Headache 11/16/2007  . PULMONARY EMBOLISM, HX OF 11/16/2007    Screening  Tests Immunization History  Administered Date(s) Administered  . Influenza Whole 11/22/2007  . Influenza-Unspecified 10/28/2016  . Pneumococcal Conjugate-13 05/02/2017  . Td 04/25/2008  . Varicella Zoster Immune Globulin 12/31/2014   Preventative care: Last colonoscopy: 2015 Dr.Magod due 2020 EGD 2017 Last mammogram: 01/2017 Last pap smear/pelvic exam: 2014 Dr. Seymour Bars DEXA: 2007 US soft tissue head 2018 MRI lumbar 2010  Prior vaccinations: TD or Tdap: 2010  Influenza: 2018 Pneumococcal: after prevnar Prevnar13: DUE Shingles/Zostavax: 2016  Names of Other Physician/Practitioners you currently use: 1. [facility] here for primary care 2. [facility], eye doctor, last visit May 2016 3. Dr. Andrey Campanile, dentist, last visit yearly Patient Care Team: Lucky Cowboy, MD as PCP - General (Internal Medicine)  SURGICAL HISTORY She  has a past surgical history that includes Foot surgery (Right); Cesarean section; Tonsillectomy (2002); and Esophagogastroduodenoscopy (N/A, 09/02/2015). FAMILY HISTORY Her family history includes Cancer (age of onset: 45) in her mother; Heart disease in her father; Hypertension in her father. SOCIAL HISTORY She  reports that  has never smoked. she has never used smokeless tobacco. She reports that she drinks alcohol. She reports that she does not use drugs.   MEDICARE WELLNESS OBJECTIVES: Physical activity: Current Exercise Habits: The patient does not participate in regular exercise at present Cardiac risk factors: Cardiac Risk Factors include: advanced age (>24men, >68 women);dyslipidemia;hypertension;sedentary lifestyle Depression/mood screen:   Depression screen [facility] 2/9 05/02/2017  Decreased Interest 0  Down, Depressed, Hopeless 0  PHQ - 2 Score 0    ADLs:  In your present state of health, do you have any difficulty performing the following activities: 05/02/2017  Hearing? N  Vision? N  Difficulty concentrating or  making decisions? N  Walking or climbing stairs? N  Dressing or bathing? N  Doing errands, shopping? N  Some recent data might be hidden    Cognitive Testing  Alert? Yes  Normal Appearance?Yes  Oriented to person? Yes  Place? Yes   Time? Yes  Recall of three objects?  Yes  Can perform simple calculations? Yes  Displays appropriate judgment?Yes  Can read the correct time from a watch face?Yes  EOL planning: Does Patient Have a Medical Advance Directive?: Yes Does patient want to make changes to medical advance directive?: Yes (MAU/Ambulatory/Procedural Areas - Information given)  ROS See HPI  Objective:     Today's Vitals   05/02/17 1041  BP: 126/74  Pulse: 73  Resp: 14  Temp: (!) 97.3 F (36.3 C)  SpO2: 94%  Weight: 159 lb (72.1 kg)  Height: 5\' 2"  (1.575 m)  PainSc: 7   PainLoc: Shoulder   Body mass index is 29.08 kg/m.  General appearance: alert, no distress, WD/WN, female HEENT: normocephalic, sclerae anicteric, TMs pearly, nares patent, no discharge or erythema, pharynx normal Oral cavity: MMM, no lesions Neck: supple, no lymphadenopathy, no thyromegaly, no masses Heart: RRR, normal S1, S2 prominent S2, no murmurs Lungs: CTA bilaterally, no wheezes, rhonchi, or rales, + chest pain with palpation Abdomen: +bs, soft, non tender, non distended, no masses, no hepatomegaly, no splenomegaly Musculoskeletal: nontender, no swelling, no obvious deformity, limited left rotation, without spinous process tenderness, with paraspinal muscle tenderness the right side, normal sensation, reflexes, and pulses distal. Extremities: no edema, no cyanosis, no clubbing Pulses: 2+ symmetric, upper and lower extremities, normal cap refill Neurological: alert, oriented x 3, CN2-12 intact, strength normal upper extremities and lower extremities, sensation normal throughout, DTRs 2+ throughout, no cerebellar signs, gait normal Psychiatric: normal affect, behavior normal, pleasant    Medicare Attestation I have personally reviewed: The patient's medical and social history Their use of alcohol, tobacco or illicit drugs Their current medications and supplements The patient's functional ability including ADLs,fall risks, home safety risks, cognitive, and hearing and visual impairment Diet and physical activities Evidence for depression or mood disorders  The patient's weight, height, BMI, and visual acuity have been recorded in the chart.  I have made referrals, counseling, and provided education to the patient based on review of the above and I have provided the patient with a written personalized care plan for preventive services.     Quentin Mulling, PA-C   05/02/2017

## 2017-05-02 NOTE — Patient Instructions (Signed)
Try the exercises and other information below, meloxicam once during the day as needed (avoid taking other NSAIDS like Alleve or Ibuprofen while taking this) and then flexeril if needed at bedtime for muscle spasm. This can be taken up to every 8 hours, but causes sedation, so should not drive or operate heavy machinery while taking this medicine.   Go to the ER if you have any new weakness in your arms, trouble with your grip, worse headache ever, fever, chills. or have worsening pain.   If you are not better in 1-3 month we will refer you to ortho   Cervical Sprain A cervical sprain is a stretch or tear in one or more of the tough, cord-like tissues that connect bones (ligaments) in the neck. Cervical sprains can range from mild to severe. Severe cervical sprains can cause the spinal bones (vertebrae) in the neck to be unstable. This can lead to spinal cord damage and can result in serious nervous system problems. The amount of time that it takes for a cervical sprain to get better depends on the cause and extent of the injury. Most cervical sprains heal in 4-6 weeks. What are the causes? Cervical sprains may be caused by an injury (trauma), such as from a motor vehicle accident, a fall, or sudden forward and backward whipping movement of the head and neck (whiplash injury). Mild cervical sprains may be caused by wear and tear over time, such as from poor posture, sitting in a chair that does not provide support, or looking up or down for long periods of time. What increases the risk? The following factors may make you more likely to develop this condition:  Participating in activities that have a high risk of trauma to the neck. These include contact sports, auto racing, gymnastics, and diving.  Taking risks when driving or riding in a motor vehicle, such as speeding.  Having osteoarthritis of the spine.  Having poor strength and flexibility of the neck.  A previous neck injury.  Having  poor posture.  Spending a lot of time in certain positions that put stress on the neck, such as sitting at a computer for long periods of time.  What are the signs or symptoms? Symptoms of this condition include:  Pain, soreness, stiffness, tenderness, swelling, or a burning sensation in the front, back, or sides of the neck.  Sudden tightening of neck muscles that you cannot control (muscle spasms).  Pain in the shoulders or upper back.  Limited ability to move the neck.  Headache.  Dizziness.  Nausea.  Vomiting.  Weakness, numbness, or tingling in a hand or an arm.  Symptoms may develop right away after injury, or they may develop over a few days. In some cases, symptoms may go away with treatment and return (recur) over time. How is this diagnosed? This condition may be diagnosed based on:  Your medical history.  Your symptoms.  Any recent injuries or known neck problems that you have, such as arthritis in the neck.  A physical exam.  Imaging tests, such as: ? X-rays. ? MRI. ? CT scan.  How is this treated? This condition is treated by resting and icing the injured area and doing physical therapy exercises. Depending on the severity of your condition, treatment may also include:  Keeping your neck in place (immobilized) for periods of time. This may be done using: ? A cervical collar. This supports your chin and the back of your head. ? A cervical traction device.   This is a sling that holds up your head. This removes weight and pressure from your neck, and it may help to relieve pain.  Medicines that help to relieve pain and inflammation.  Medicines that help to relax your muscles (muscle relaxants).  Surgery. This is rare.  Follow these instructions at home: If you have a cervical collar:  Wear it as told by your health care provider. Do not remove the collar unless instructed by your health care provider.  Ask your health care provider before you make  any adjustments to your collar.  If you have long hair, keep it outside of the collar.  Ask your health care provider if you can remove the collar for cleaning and bathing. If you are allowed to remove the collar for cleaning or bathing: ? Follow instructions from your health care provider about how to remove the collar safely. ? Clean the collar by wiping it with mild soap and water and drying it completely. ? If your collar has removable pads, remove them every 1-2 days and wash them by hand with soap and water. Let them air-dry completely before you put them back in the collar. ? Check your skin under the collar for irritation or sores. If you see any, tell your health care provider. Managing pain, stiffness, and swelling  If directed, use a cervical traction device as told by your health care provider.  If directed, apply heat to the affected area before you do your physical therapy or as often as told by your health care provider. Use the heat source that your health care provider recommends, such as a moist heat pack or a heating pad. ? Place a towel between your skin and the heat source. ? Leave the heat on for 20-30 minutes. ? Remove the heat if your skin turns bright red. This is especially important if you are unable to feel pain, heat, or cold. You may have a greater risk of getting burned.  If directed, put ice on the affected area: ? Put ice in a plastic bag. ? Place a towel between your skin and the bag. ? Leave the ice on for 20 minutes, 2-3 times a day. Activity  Do not drive while wearing a cervical collar. If you do not have a cervical collar, ask your health care provider if it is safe to drive while your neck heals.  Do not drive or use heavy machinery while taking prescription pain medicine or muscle relaxants, unless your health care provider approves.  Do not lift anything that is heavier than 10 lb (4.5 kg) until your health care provider tells you that it is  safe.  Rest as directed by your health care provider. Avoid positions and activities that make your symptoms worse. Ask your health care provider what activities are safe for you.  If physical therapy was prescribed, do exercises as told by your health care provider or physical therapist. General instructions  Take over-the-counter and prescription medicines only as told by your health care provider.  Do not use any products that contain nicotine or tobacco, such as cigarettes and e-cigarettes. These can delay healing. If you need help quitting, ask your health care provider.  Keep all follow-up visits as told by your health care provider or physical therapist. This is important. How is this prevented? To prevent a cervical sprain from happening again:  Use and maintain good posture. Make any needed adjustments to your workstation to help you use good posture.  Exercise   regularly as directed by your health care provider or physical therapist.  Avoid risky activities that may cause a cervical sprain.  Contact a health care provider if:  You have symptoms that get worse or do not get better after 2 weeks of treatment.  You have pain that gets worse or does not get better with medicine.  You develop new, unexplained symptoms.  You have sores or irritated skin on your neck from wearing your cervical collar. Get help right away if:  You have severe pain.  You develop numbness, tingling, or weakness in any part of your body.  You cannot move a part of your body (you have paralysis).  You have neck pain along with: ? Severe dizziness. ? Headache. Summary  A cervical sprain is a stretch or tear in one or more of the tough, cord-like tissues that connect bones (ligaments) in the neck.  Cervical sprains may be caused by an injury (trauma), such as from a motor vehicle accident, a fall, or sudden forward and backward whipping movement of the head and neck (whiplash  injury).  Symptoms may develop right away after injury, or they may develop over a few days.  This condition is treated by resting and icing the injured area and doing physical therapy exercises. This information is not intended to replace advice given to you by your health care provider. Make sure you discuss any questions you have with your health care provider. Document Released: 12/05/2006 Document Revised: 10/07/2015 Document Reviewed: 10/07/2015 Else[employer] Interactive Patient Education  2017 [employer]   Cervical Strain and Sprain Rehab Ask your health care provider which exercises are safe for you. Do exercises exactly as told by your health care provider and adjust them as directed. It is normal to feel mild stretching, pulling, tightness, or discomfort as you do these exercises, but you should stop right away if you feel sudden pain or your pain gets worse.Do not begin these exercises until told by your health care provider. Stretching and range of motion exercises These exercises warm up your muscles and joints and improve the movement and flexibility of your neck. These exercises also help to relieve pain, numbness, and tingling. Exercise A: Cervical side bend  1. Using good posture, sit on a stable chair or stand up. 2. Without moving your shoulders, slowly tilt your left / right ear to your shoulder until you feel a stretch in your neck muscles. You should be looking straight ahead. 3. Hold for __________ seconds. 4. Repeat with the other side of your neck. Repeat __________ times. Complete this exercise __________ times a day. Exercise B: Cervical rotation  1. Using good posture, sit on a stable chair or stand up. 2. Slowly turn your head to the side as if you are looking over your left / right shoulder. ? Keep your eyes level with the ground. ? Stop when you feel a stretch along the side and the back of your neck. 3. Hold for __________ seconds. 4. Repeat this by turning  to your other side. Repeat __________ times. Complete this exercise __________ times a day. Exercise C: Thoracic extension and pectoral stretch 1. Roll a towel or a small blanket so it is about 4 inches (10 cm) in diameter. 2. Lie down on your back on a firm surface. 3. Put the towel lengthwise, under your spine in the middle of your back. It should not be not under your shoulder blades. The towel should line up with your spine from your   middle back to your lower back. 4. Put your hands behind your head and let your elbows fall out to your sides. 5. Hold for __________ seconds. Repeat __________ times. Complete this exercise __________ times a day. Strengthening exercises These exercises build strength and endurance in your neck. Endurance is the ability to use your muscles for a long time, even after your muscles get tired. Exercise D: Upper cervical flexion, isometric 1. Lie on your back with a thin pillow behind your head and a small rolled-up towel under your neck. 2. Gently tuck your chin toward your chest and nod your head down to look toward your feet. Do not lift your head off the pillow. 3. Hold for __________ seconds. 4. Release the tension slowly. Relax your neck muscles completely before you repeat this exercise. Repeat __________ times. Complete this exercise __________ times a day. Exercise E: Cervical extension, isometric  1. Stand about 6 inches (15 cm) away from a wall, with your back facing the wall. 2. Place a soft object, about 6-8 inches (15-20 cm) in diameter, between the back of your head and the wall. A soft object could be a small pillow, a ball, or a folded towel. 3. Gently tilt your head back and press into the soft object. Keep your jaw and forehead relaxed. 4. Hold for __________ seconds. 5. Release the tension slowly. Relax your neck muscles completely before you repeat this exercise. Repeat __________ times. Complete this exercise __________ times a  day. Posture and body mechanics  Body mechanics refers to the movements and positions of your body while you do your daily activities. Posture is part of body mechanics. Good posture and healthy body mechanics can help to relieve stress in your body's tissues and joints. Good posture means that your spine is in its natural S-curve position (your spine is neutral), your shoulders are pulled back slightly, and your head is not tipped forward. The following are general guidelines for applying improved posture and body mechanics to your everyday activities. Standing  When standing, keep your spine neutral and keep your feet about hip-width apart. Keep a slight bend in your knees. Your ears, shoulders, and hips should line up.  When you do a task in which you stand in one place for a long time, place one foot up on a stable object that is 2-4 inches (5-10 cm) high, such as a footstool. This helps keep your spine neutral. Sitting   When sitting, keep your spine neutral and your keep feet flat on the floor. Use a footrest, if necessary, and keep your thighs parallel to the floor. Avoid rounding your shoulders, and avoid tilting your head forward.  When working at a desk or a computer, keep your desk at a height where your hands are slightly lower than your elbows. Slide your chair under your desk so you are close enough to maintain good posture.  When working at a computer, place your monitor at a height where you are looking straight ahead and you do not have to tilt your head forward or downward to look at the screen. Resting When lying down and resting, avoid positions that are most painful for you. Try to support your neck in a neutral position. You can use a contour pillow or a small rolled-up towel. Your pillow should support your neck but not push on it. This information is not intended to replace advice given to you by your health care provider. Make sure you discuss any questions you have   with  your health care provider. Document Released: 02/07/2005 Document Revised: 10/15/2015 Document Reviewed: 01/14/2015 Elsevier Interactive Patient Education  2018 [employer]   

## 2017-05-03 ENCOUNTER — Ambulatory Visit (HOSPITAL_COMMUNITY)
Admission: RE | Admit: 2017-05-03 | Discharge: 2017-05-03 | Disposition: A | Discharge status: Home / Self Care | Payer: Medicare Other | Source: Ambulatory Visit | Attending: Physician Assistant | Admitting: Physician Assistant

## 2017-05-03 DIAGNOSIS — M5030 Other cervical disc degeneration, unspecified cervical region: Secondary | ICD-10-CM | POA: Insufficient documentation

## 2017-05-03 DIAGNOSIS — R531 Weakness: Secondary | ICD-10-CM

## 2017-05-03 DIAGNOSIS — M542 Cervicalgia: Secondary | ICD-10-CM

## 2017-05-03 LAB — BASIC METABOLIC PANEL WITH GFR
BUN: 15 mg/dL (ref 7–25)
CO2: 30 mmol/L (ref 20–32)
Calcium: 9.9 mg/dL (ref 8.6–10.4)
Chloride: 105 mmol/L (ref 98–110)
Creat: 0.87 mg/dL (ref 0.50–0.99)
GFR, Est African American: 81 mL/min/{1.73_m2} (ref >=60)
GFR, Est Non African American: 70 mL/min/{1.73_m2} (ref >=60)
Glucose, Bld: 98 mg/dL (ref 65–99)
Potassium: 4.5 mmol/L (ref 3.5–5.3)
Sodium: 141 mmol/L (ref 135–146)

## 2017-05-03 LAB — CBC WITH DIFFERENTIAL/PLATELET
Basophils Absolute: 69 cells/uL (ref 0–200)
Basophils Relative: 1 %
Eosinophils Absolute: 166 cells/uL (ref 15–500)
Eosinophils Relative: 2.4 %
HCT: 47.7 % — ABNORMAL HIGH (ref 35.0–45.0)
Hemoglobin: 16.5 g/dL — ABNORMAL HIGH (ref 11.7–15.5)
Lymphs Abs: 1732 cells/uL (ref 850–3900)
MCH: 30 pg (ref 27.0–33.0)
MCHC: 34.6 g/dL (ref 32.0–36.0)
MCV: 86.7 fL (ref 80.0–100.0)
MPV: 9.9 fL (ref 7.5–12.5)
Monocytes Relative: 6.9 %
Neutro Abs: 4457 cells/uL (ref 1500–7800)
Neutrophils Relative %: 64.6 %
Platelets: 344 10*3/uL (ref 140–400)
RBC: 5.5 10*6/uL — ABNORMAL HIGH (ref 3.80–5.10)
RDW: 13.3 % (ref 11.0–15.0)
Total Lymphocyte: 25.1 %
WBC mixed population: 476 cells/uL (ref 200–950)
WBC: 6.9 10*3/uL (ref 3.8–10.8)

## 2017-05-03 LAB — HEPATIC FUNCTION PANEL
AG Ratio: 2 (calc) (ref 1.0–2.5)
ALT: 20 U/L (ref 6–29)
AST: 21 U/L (ref 10–35)
Albumin: 4.4 g/dL (ref 3.6–5.1)
Alkaline phosphatase (APISO): 74 U/L (ref 33–130)
Bilirubin, Direct: 0.1 mg/dL (ref 0.0–0.2)
Globulin: 2.2 g/dL (calc) (ref 1.9–3.7)
Indirect Bilirubin: 0.3 mg/dL (calc) (ref 0.2–1.2)
Total Bilirubin: 0.4 mg/dL (ref 0.2–1.2)
Total Protein: 6.6 g/dL (ref 6.1–8.1)

## 2017-05-03 LAB — MAGNESIUM: Magnesium: 2.1 mg/dL (ref 1.5–2.5)

## 2017-05-03 LAB — TSH: TSH: 5.59 mIU/L — ABNORMAL HIGH (ref 0.40–4.50)

## 2017-05-03 MED ORDER — MELOXICAM 15 MG PO TABS: ORAL_TABLET | ORAL | 1 refills | Status: DC

## 2017-05-08 ENCOUNTER — Encounter: Payer: Self-pay | Admitting: Physician Assistant

## 2017-06-06 ENCOUNTER — Encounter: Payer: Self-pay | Admitting: Physician Assistant

## 2017-06-12 ENCOUNTER — Other Ambulatory Visit: Payer: Self-pay | Admitting: Physician Assistant

## 2017-06-12 DIAGNOSIS — F988 Other specified behavioral and emotional disorders with onset usually occurring in childhood and adolescence: Secondary | ICD-10-CM

## 2017-06-12 MED ORDER — AMPHETAMINE-DEXTROAMPHETAMINE 20 MG PO TABS: ORAL_TABLET | ORAL | 0 refills | Status: DC

## 2017-06-15 ENCOUNTER — Other Ambulatory Visit: Payer: Self-pay

## 2017-06-15 DIAGNOSIS — F419 Anxiety disorder, unspecified: Secondary | ICD-10-CM

## 2017-06-15 MED ORDER — ESCITALOPRAM OXALATE 10 MG PO TABS: 10.0000 mg | ORAL_TABLET | Freq: Every day | ORAL | 3 refills | Status: DC

## 2017-06-27 ENCOUNTER — Encounter: Payer: Self-pay | Admitting: Physician Assistant

## 2017-07-04 ENCOUNTER — Other Ambulatory Visit: Payer: Self-pay

## 2017-07-04 MED ORDER — SYNTHROID 125 MCG PO TABS: 125.0000 ug | ORAL_TABLET | Freq: Every day | ORAL | 1 refills | Status: DC

## 2017-07-06 ENCOUNTER — Other Ambulatory Visit: Payer: Self-pay | Admitting: *Deleted

## 2017-07-06 MED ORDER — OMEPRAZOLE 40 MG PO CPDR: 40.0000 mg | DELAYED_RELEASE_CAPSULE | Freq: Every day | ORAL | 1 refills | Status: DC

## 2017-07-07 ENCOUNTER — Other Ambulatory Visit: Payer: Self-pay | Admitting: Physician Assistant

## 2017-07-07 DIAGNOSIS — F419 Anxiety disorder, unspecified: Secondary | ICD-10-CM

## 2017-07-25 ENCOUNTER — Other Ambulatory Visit: Payer: Self-pay | Admitting: Internal Medicine

## 2017-07-25 DIAGNOSIS — F988 Other specified behavioral and emotional disorders with onset usually occurring in childhood and adolescence: Secondary | ICD-10-CM

## 2017-07-25 MED ORDER — AMPHETAMINE-DEXTROAMPHETAMINE 20 MG PO TABS: ORAL_TABLET | ORAL | 0 refills | Status: DC

## 2017-08-04 ENCOUNTER — Other Ambulatory Visit: Payer: Self-pay | Admitting: Internal Medicine

## 2017-08-04 DIAGNOSIS — F419 Anxiety disorder, unspecified: Secondary | ICD-10-CM

## 2017-08-04 MED ORDER — ALPRAZOLAM 1 MG PO TABS: ORAL_TABLET | ORAL | 0 refills | Status: DC

## 2017-09-19 ENCOUNTER — Other Ambulatory Visit: Payer: Self-pay | Admitting: Physician Assistant

## 2017-09-19 DIAGNOSIS — M542 Cervicalgia: Secondary | ICD-10-CM

## 2017-09-27 ENCOUNTER — Other Ambulatory Visit: Payer: Self-pay | Admitting: Internal Medicine

## 2017-09-27 DIAGNOSIS — F988 Other specified behavioral and emotional disorders with onset usually occurring in childhood and adolescence: Secondary | ICD-10-CM

## 2017-09-27 MED ORDER — AMPHETAMINE-DEXTROAMPHETAMINE 20 MG PO TABS: ORAL_TABLET | ORAL | 0 refills | Status: DC

## 2017-10-06 DIAGNOSIS — L290 Pruritus ani: Secondary | ICD-10-CM | POA: Diagnosis not present

## 2017-10-06 DIAGNOSIS — K219 Gastro-esophageal reflux disease without esophagitis: Secondary | ICD-10-CM | POA: Diagnosis not present

## 2017-10-06 DIAGNOSIS — R1084 Generalized abdominal pain: Secondary | ICD-10-CM | POA: Diagnosis not present

## 2017-10-07 NOTE — Progress Notes (Signed)
Assessment and Plan: Hypothyroidism, unspecified type -     TSH  Blood in stool with history of Gastroesophageal reflux disease without esophagitis Iron deficiency anemia, unspecified iron deficiency anemia type Left lower quadrant pain BRBPR with LLQ, nausea, will given ABX for possible diverticulitis/colitis/infection check labs, refer to GI, due for colonoscopy 2020, LLQ tenderness no rebound- discussed diet Melana - get back on Prilosec daily, will check labs, follow up GI for EGD -     metroNIDAZOLE (FLAGYL) 500 MG tablet; Take 1 tablet (500 mg total) by mouth 3 (three) times daily for 7 days. -     CBC with Differential/Platelet -     COMPLETE METABOLIC PANEL WITH GFR -     amoxicillin-clavulanate (AUGMENTIN) 875-125 MG tablet; Take 1 tablet by mouth 2 (two) times daily for 7 days.    HPI 67 y.o.female has Hypothyroidism; Iron deficiency anemia; Obstructive sleep apnea; Allergic rhinitis; Headache; PULMONARY EMBOLISM, HX OF; Hyperlipidemia; Hypertension; Anxiety; GERD (gastroesophageal reflux disease); ADD (attention deficit disorder); Depression; Vitamin D deficiency; Prediabetes; Vaginal atrophy; and Medication management on their problem list. presents for GI issues and mole evaluation.   Week ago Tuesday she had BRBPR x 2 days but then it went to dark tarry stools x 2-3 days, having sharp LLQ AB pain and epigastric discomfort, nausea, no vomiting, no fever or chills. She went to urgent care Friday had H/H of 15.6/45.1, had positive hemoccult. [place]. She did have some minor diarrhea last night but otherwise no diarrhea, constipation.    She is on prilosec 2-3 days a week and she is on zantac since seeing UC.  She has been on mobic 3-4 days week but has stopped since this started.   She is on thyroid medication. Her medication was changed last visit.   Lab Results  Component Value Date   TSH 5.59 (H) 05/02/2017  .  Last colonoscopy:  2015 Dr.Magod due 2020 EGD 2017  Blood pressure 136/80, pulse 86, temperature 98.6 F (37 C), resp. rate 16, height 5\' 2"  (1.575 m), weight 162 lb 12.8 oz (73.8 kg), SpO2 96 %.   Past Medical History:  Diagnosis Date  . ADD (attention deficit disorder)   . Anxiety   . Depression   . GERD (gastroesophageal reflux disease)   . Hyperlipidemia   . Hypertension   . Hypothyroid   . PONV (postoperative nausea and vomiting)   . Vitamin D deficiency      Allergies  Allergen Reactions  . Cefprozil   . Codeine     REACTION: vomiting  . Hydrocodone-Acetaminophen   . Levofloxacin   . Morphine   . Pravastatin Swelling    Current Outpatient Medications on File Prior to Visit  Medication Sig  . ALPRAZolam (XANAX) 1 MG tablet 1/2-1 tablet as needed for sleep/anxiety  . amphetamine-dextroamphetamine (ADDERALL) 20 MG tablet Take 1/2 to 1 tablet daily if needed for ADD and please try to limit to 5 days /week to avoid addiction Rx to last 6 weeks  . clobetasol (TEMOVATE) 0.05 % external solution Apply 1 application topically 2 (two) times daily.  Marland Kitchen. conjugated estrogens (PREMARIN) vaginal cream Place 1 Applicatorful vaginally daily.  Marland Kitchen. EPINEPHrine 0.15 MG/0.15ML IJ injection Inject 0.15 mLs (0.15 mg total) into the muscle as needed for anaphylaxis.  Marland Kitchen. escitalopram (LEXAPRO) 10 MG tablet TAKE 1 TABLET BY MOUTH EVERY DAY  . ketoconazole (NIZORAL) 2 % cream APPLY TO AFFECTED AREA(S) TWO TIMES A DAY  .  lidocaine (LIDODERM) 5 % Place 1 patch onto the skin daily. Remove & Discard patch within 12 hours or as directed by MD  . meclizine (ANTIVERT) 25 MG tablet 1/2-1 pill up to 3 times daily for motion sickness/dizziness  . meloxicam (MOBIC) 15 MG tablet PLEASE SEE ATTACHED FOR DETAILED DIRECTIONS  . omeprazole (PRILOSEC) 40 MG capsule Take 1 capsule (40 mg total) by mouth daily.  . ranitidine (ZANTAC) 300 MG tablet TAKE 1 TABLET BY MOUTH TWICE A DAY WHILE TRYING TO GET OFF OF PPI, THEN CAN USE 1 TABLET  AT NIGHT.  Marland Kitchen. SYNTHROID 125 MCG tablet Take 1 tablet (125 mcg total) by mouth daily before breakfast.  . triamcinolone ointment (KENALOG) 0.1 % Apply 1 application topically 2 (two) times daily.  . Vitamin D, Ergocalciferol, (DRISDOL) 50000 units CAPS capsule TAKE 1 CAPSULE BY MOUTH 3 DAYS A WEEK.   No current facility-administered medications on file prior to visit.     ROS: all negative except above.   Physical Exam: Filed Weights   10/09/17 1417  Weight: 162 lb 12.8 oz (73.8 kg)   BP 136/80   Pulse 86   Temp 98.6 F (37 C)   Resp 16   Ht 5\' 2"  (1.575 m)   Wt 162 lb 12.8 oz (73.8 kg)   SpO2 96%   BMI 29.78 kg/m  General Appearance: Well nourished, in no apparent distress. Eyes: PERRLA, EOMs, conjunctiva no swelling or erythema Sinuses: No Frontal/maxillary tenderness ENT/Mouth: Ext aud canals clear, TMs without erythema, bulging. No erythema, swelling, or exudate on post pharynx.  Tonsils not swollen or erythematous. Hearing normal.  Neck: Supple, thyroid normal.  Respiratory: Respiratory effort normal, BS equal bilaterally without rales, rhonchi, wheezing or stridor.  Cardio: RRR with no MRGs. Brisk peripheral pulses without edema.  Abdomen: Soft, + BS.  + epigastric and LLQ tenderness, no guarding, rebound, hernias, masses. Lymphatics: Non tender without lymphadenopathy.  Musculoskeletal: Full ROM, 5/5 strength, normal gait.  Skin: Warm, dry without rashes, lesions, ecchymosis.  Neuro: Cranial nerves intact. Normal muscle tone, no cerebellar symptoms. Sensation intact.  Psych: Awake and oriented X 3, normal affect, Insight and Judgment appropriate.     Quentin MullingAmanda Darin Redmann, PA-C 2:45 PM [facility]

## 2017-10-09 ENCOUNTER — Ambulatory Visit (INDEPENDENT_AMBULATORY_CARE_PROVIDER_SITE_OTHER): Payer: Medicare Other | Admitting: Physician Assistant

## 2017-10-09 ENCOUNTER — Encounter: Payer: Self-pay | Admitting: Physician Assistant

## 2017-10-09 VITALS — BP 136/80 | HR 86 | Temp 98.6°F | Resp 16 | Ht 62.0 in | Wt 162.8 lb

## 2017-10-09 DIAGNOSIS — R1032 Left lower quadrant pain: Secondary | ICD-10-CM

## 2017-10-09 DIAGNOSIS — K219 Gastro-esophageal reflux disease without esophagitis: Secondary | ICD-10-CM | POA: Diagnosis not present

## 2017-10-09 DIAGNOSIS — E039 Hypothyroidism, unspecified: Secondary | ICD-10-CM

## 2017-10-09 DIAGNOSIS — I10 Essential (primary) hypertension: Secondary | ICD-10-CM | POA: Diagnosis not present

## 2017-10-09 DIAGNOSIS — D509 Iron deficiency anemia, unspecified: Secondary | ICD-10-CM | POA: Diagnosis not present

## 2017-10-09 MED ORDER — METRONIDAZOLE 500 MG PO TABS: 500.0000 mg | ORAL_TABLET | Freq: Three times a day (TID) | ORAL | 0 refills | Status: AC

## 2017-10-09 MED ORDER — AMOXICILLIN-POT CLAVULANATE 875-125 MG PO TABS: 1.0000 | ORAL_TABLET | Freq: Two times a day (BID) | ORAL | 0 refills | Status: AC

## 2017-10-09 NOTE — Patient Instructions (Signed)
Get back on prilosec Start on cipro/flaygl  Follow up with GI   Diverticulitis Diverticulitis is infection or inflammation of small pouches (diverticula) in the colon that form due to a condition called diverticulosis. Diverticula can trap stool (feces) and bacteria, causing infection and inflammation. Diverticulitis may cause severe stomach pain and diarrhea. It may lead to tissue damage in the colon that causes bleeding. The diverticula may also burst (rupture) and cause infected stool to enter other areas of the abdomen. Complications of diverticulitis can include:  Bleeding.  Severe infection.  Severe pain.  Rupture (perforation) of the colon.  Blockage (obstruction) of the colon.  What are the causes? This condition is caused by stool becoming trapped in the diverticula, which allows bacteria to grow in the diverticula. This leads to inflammation and infection. What increases the risk? You are more likely to develop this condition if:  You have diverticulosis. The risk for diverticulosis increases if: ? You are overweight or obese. ? You use tobacco products. ? You do not get enough exercise.  You eat a diet that does not include enough fiber. High-fiber foods include fruits, vegetables, beans, nuts, and whole grains.  What are the signs or symptoms? Symptoms of this condition may include:  Pain and tenderness in the abdomen. The pain is normally located on the left side of the abdomen, but it may occur in other areas.  Fever and chills.  Bloating.  Cramping.  Nausea.  Vomiting.  Changes in bowel routines.  Blood in your stool.  How is this diagnosed? This condition is diagnosed based on:  Your medical history.  A physical exam.  Tests to make sure there is nothing else causing your condition. These tests may include: ? Blood tests. ? Urine tests. ? Imaging tests of the abdomen, including X-rays, ultrasounds, MRIs, or CT scans.  How is this  treated? Most cases of this condition are mild and can be treated at home. Treatment may include:  Taking over-the-counter pain medicines.  Following a clear liquid diet.  Taking antibiotic medicines by mouth.  Rest.  More severe cases may need to be treated at a hospital. Treatment may include:  Not eating or drinking.  Taking prescription pain medicine.  Receiving antibiotic medicines through an IV tube.  Receiving fluids and nutrition through an IV tube.  Surgery.  When your condition is under control, your health care provider may recommend that you have a colonoscopy. This is an exam to look at the entire large intestine. During the exam, a lubricated, bendable tube is inserted into the anus and then passed into the rectum, colon, and other parts of the large intestine. A colonoscopy can show how severe your diverticula are and whether something else may be causing your symptoms. Follow these instructions at home: Medicines  Take over-the-counter and prescription medicines only as told by your health care provider. These include fiber supplements, probiotics, and stool softeners.  If you were prescribed an antibiotic medicine, take it as told by your health care provider. Do not stop taking the antibiotic even if you start to feel better.  Do not drive or use heavy machinery while taking prescription pain medicine. General instructions  Follow a full liquid diet or another diet as directed by your health care provider. After your symptoms improve, your health care provider may tell you to change your diet. He or she may recommend that you eat a diet that contains at least 25 g (25 grams) of fiber daily. Fiber makes  it easier to pass stool. Healthy sources of fiber include: ? Berries. One cup contains 4-8 grams of fiber. ? Beans or lentils. One half cup contains 5-8 grams of fiber. ? Green vegetables. One cup contains 4 grams of fiber.  Exercise for at least 30 minutes, 3  times each week. You should exercise hard enough to raise your heart rate and break a sweat.  Keep all follow-up visits as told by your health care provider. This is important. You may need a colonoscopy. Contact a health care provider if:  Your pain does not improve.  You have a hard time drinking or eating food.  Your bowel movements do not return to normal. Get help right away if:  Your pain gets worse.  Your symptoms do not get better with treatment.  Your symptoms suddenly get worse.  You have a fever.  You vomit more than one time.  You have stools that are bloody, black, or tarry. Summary  Diverticulitis is infection or inflammation of small pouches (diverticula) in the colon that form due to a condition called diverticulosis. Diverticula can trap stool (feces) and bacteria, causing infection and inflammation.  You are at higher risk for this condition if you have diverticulosis and you eat a diet that does not include enough fiber.  Most cases of this condition are mild and can be treated at home. More severe cases may need to be treated at a hospital.  When your condition is under control, your health care provider may recommend that you have an exam called a colonoscopy. This exam can show how severe your diverticula are and whether something else may be causing your symptoms. This information is not intended to replace advice given to you by your health care provider. Make sure you discuss any questions you have with your health care provider. Document Released: 11/17/2004 Document Revised: 03/12/2016 Document Reviewed: 03/12/2016 Elsevier Interactive Patient Education  2018 [employer]  Abdominal Pain, Adult Abdominal pain can be caused by many things. Often, abdominal pain is not serious and it gets better with no treatment or by being treated at home. However, sometimes abdominal pain is serious. Your health care provider will do a medical history and a physical  exam to try to determine the cause of your abdominal pain. Follow these instructions at home:  Take over-the-counter and prescription medicines only as told by your health care provider. Do not take a laxative unless told by your health care provider.  Drink enough fluid to keep your urine clear or pale yellow.  Watch your condition for any changes.  Keep all follow-up visits as told by your health care provider. This is important. Contact a health care provider if:  Your abdominal pain changes or gets worse.  You are not hungry or you lose weight without trying.  You are constipated or have diarrhea for more than 2-3 days.  You have pain when you urinate or have a bowel movement.  Your abdominal pain wakes you up at night.  Your pain gets worse with meals, after eating, or with certain foods.  You are throwing up and cannot keep anything down.  You have a fever. Get help right away if:  Your pain does not go away as soon as your health care provider told you to expect.  You cannot stop throwing up.  Your pain is only in areas of the abdomen, such as the right side or the left lower portion of the abdomen.  You have bloody  or black stools, or stools that look like tar.  You have severe pain, cramping, or bloating in your abdomen.  You have signs of dehydration, such as: ? Dark urine, very little urine, or no urine. ? Cracked lips. ? Dry mouth. ? Sunken eyes. ? Sleepiness. ? Weakness. This information is not intended to replace advice given to you by your health care provider. Make sure you discuss any questions you have with your health care provider. Document Released: 11/17/2004 Document Revised: 08/28/2015 Document Reviewed: 07/22/2015 Elsevier Interactive Patient Education  [employer].

## 2017-10-10 LAB — COMPLETE METABOLIC PANEL WITH GFR
AG Ratio: 1.8 (calc) (ref 1.0–2.5)
ALT: 20 U/L (ref 6–29)
AST: 21 U/L (ref 10–35)
Albumin: 4.6 g/dL (ref 3.6–5.1)
Alkaline phosphatase (APISO): 89 U/L (ref 33–130)
BUN: 16 mg/dL (ref 7–25)
CO2: 28 mmol/L (ref 20–32)
Calcium: 10 mg/dL (ref 8.6–10.4)
Chloride: 100 mmol/L (ref 98–110)
Creat: 0.78 mg/dL (ref 0.50–0.99)
GFR, Est African American: 92 mL/min/{1.73_m2} (ref >=60)
GFR, Est Non African American: 80 mL/min/{1.73_m2} (ref >=60)
Globulin: 2.5 g/dL (calc) (ref 1.9–3.7)
Glucose, Bld: 83 mg/dL (ref 65–99)
Potassium: 4.6 mmol/L (ref 3.5–5.3)
Sodium: 138 mmol/L (ref 135–146)
Total Bilirubin: 0.6 mg/dL (ref 0.2–1.2)
Total Protein: 7.1 g/dL (ref 6.1–8.1)

## 2017-10-10 LAB — CBC WITH DIFFERENTIAL/PLATELET
Basophils Absolute: 88 cells/uL (ref 0–200)
Basophils Relative: 0.9 %
Eosinophils Absolute: 343 cells/uL (ref 15–500)
Eosinophils Relative: 3.5 %
HCT: 46.3 % — ABNORMAL HIGH (ref 35.0–45.0)
Hemoglobin: 15.7 g/dL — ABNORMAL HIGH (ref 11.7–15.5)
Lymphs Abs: 2234 cells/uL (ref 850–3900)
MCH: 28.9 pg (ref 27.0–33.0)
MCHC: 33.9 g/dL (ref 32.0–36.0)
MCV: 85.1 fL (ref 80.0–100.0)
MPV: 10 fL (ref 7.5–12.5)
Monocytes Relative: 8.1 %
Neutro Abs: 6341 cells/uL (ref 1500–7800)
Neutrophils Relative %: 64.7 %
Platelets: 364 10*3/uL (ref 140–400)
RBC: 5.44 10*6/uL — ABNORMAL HIGH (ref 3.80–5.10)
RDW: 13.1 % (ref 11.0–15.0)
Total Lymphocyte: 22.8 %
WBC mixed population: 794 cells/uL (ref 200–950)
WBC: 9.8 10*3/uL (ref 3.8–10.8)

## 2017-10-10 LAB — TSH: TSH: 4.07 mIU/L (ref 0.40–4.50)

## 2017-10-11 ENCOUNTER — Other Ambulatory Visit: Payer: Self-pay | Admitting: Physician Assistant

## 2017-10-11 DIAGNOSIS — R1032 Left lower quadrant pain: Secondary | ICD-10-CM | POA: Diagnosis not present

## 2017-10-11 DIAGNOSIS — Z86010 Personal history of colonic polyps: Secondary | ICD-10-CM | POA: Diagnosis not present

## 2017-10-11 DIAGNOSIS — K625 Hemorrhage of anus and rectum: Secondary | ICD-10-CM | POA: Diagnosis not present

## 2017-10-11 DIAGNOSIS — K921 Melena: Secondary | ICD-10-CM | POA: Diagnosis not present

## 2017-10-19 ENCOUNTER — Ambulatory Visit
Admission: RE | Admit: 2017-10-19 | Discharge: 2017-10-19 | Disposition: A | Discharge status: Home / Self Care | Payer: Medicare Other | Source: Ambulatory Visit | Attending: Physician Assistant | Admitting: Physician Assistant

## 2017-10-19 DIAGNOSIS — R1032 Left lower quadrant pain: Secondary | ICD-10-CM | POA: Diagnosis not present

## 2017-10-19 MED ORDER — IOPAMIDOL (ISOVUE-300) INJECTION 61%
100.0000 mL | Freq: Once | INTRAVENOUS | Status: AC | PRN
  Administered 2017-10-19: 100 mL via INTRAVENOUS

## 2017-10-24 DIAGNOSIS — Z23 Encounter for immunization: Secondary | ICD-10-CM | POA: Diagnosis not present

## 2017-11-14 DIAGNOSIS — K625 Hemorrhage of anus and rectum: Secondary | ICD-10-CM | POA: Diagnosis not present

## 2017-11-14 DIAGNOSIS — R1032 Left lower quadrant pain: Secondary | ICD-10-CM | POA: Diagnosis not present

## 2017-11-14 DIAGNOSIS — Z86010 Personal history of colonic polyps: Secondary | ICD-10-CM | POA: Diagnosis not present

## 2017-11-15 ENCOUNTER — Other Ambulatory Visit: Payer: Self-pay

## 2017-11-15 DIAGNOSIS — F988 Other specified behavioral and emotional disorders with onset usually occurring in childhood and adolescence: Secondary | ICD-10-CM

## 2017-11-16 ENCOUNTER — Other Ambulatory Visit: Payer: Self-pay | Admitting: Physician Assistant

## 2017-11-16 ENCOUNTER — Encounter: Payer: Self-pay | Admitting: Physician Assistant

## 2017-11-16 DIAGNOSIS — F988 Other specified behavioral and emotional disorders with onset usually occurring in childhood and adolescence: Secondary | ICD-10-CM

## 2017-11-16 MED ORDER — AMPHETAMINE-DEXTROAMPHETAMINE 20 MG PO TABS: ORAL_TABLET | ORAL | 0 refills | Status: DC

## 2017-11-16 NOTE — Telephone Encounter (Signed)
-----   Message from Gregery Na, CMA sent at 11/15/2017 11:26 AM EDT ----- Regarding: REFILL PER PT/YELLOW NOTE:  Refill on ADDERALL Please & thank you!  [facility]

## 2017-12-04 ENCOUNTER — Encounter: Payer: Self-pay | Admitting: Physician Assistant

## 2017-12-18 NOTE — Progress Notes (Signed)
FOLLOW UP (declines cpe)  Assessment:    Essential hypertension - continue medications, DASH diet, exercise and monitor at home. Call if greater than 130/80.  -     CBC with Differential/Platelet -     BASIC METABOLIC PANEL WITH GFR -     Hepatic function panel -     TSH  Hypothyroidism, unspecified type Hypothyroidism-check TSH level, continue medications the same, reminded to take on an empty stomach 30-9mins before food.  -     TSH  Hyperlipidemia, unspecified hyperlipidemia type -continue medications, check lipids, decrease fatty foods, increase activity.   Recurrent major depressive disorder, in full remission (HCC) - continue medications, stress management techniques discussed, increase water, good sleep hygiene discussed, increase exercise, and increase veggies.   Leg pain Likely peroneal nerve Will get xray due to previous injury/surgery mobic x 2 weeks If not better will refer ortho   Over 30 minutes of exam, counseling, chart review and critical decision making was performed Future Appointments  Date Time Provider Department Center  05/16/2018 10:45 AM Quentin Mulling, PA-C [facility] None  01/01/2019 10:00 AM Quentin Mulling, PA-C [facility] None      Subjective:  Diana Wolfe is a 67 y.o. right handed female female who presents for 3 month follow up.   She states on her left lateral knee, she hit her leg on the car door several months ago and she now feels like it is a pinched nerve. She states if you touch it on the right spot or if she is on her knees doing yoga. Worse last month. No back pain. No warmth or swelling.    Her blood pressure has been controlled at home, today their BP is BP: 138/76 She does not workout, she is active with her dog. She denies chest pain, shortness of breath, dizziness.  She is not on cholesterol medication and denies myalgias. Her cholesterol is at goal. The cholesterol last visit was:   Lab Results  Component Value  Date   CHOL 194 06/23/2016   HDL 47 (L) 06/23/2016   LDLCALC 108 (H) 06/23/2016   LDLDIRECT 138.2 04/25/2008   TRIG 196 (H) 06/23/2016   CHOLHDL 4.1 06/23/2016   She is on the adderall, 1/2 tablet 1-2 x a day mainly when she drives.   Last A1C in the office was:  Lab Results  Component Value Date   HGBA1C 5.5 06/23/2015   Last GFR: Lab Results  Component Value Date   GFRNONAA 80 10/09/2017   She is on thyroid medication. Her medication was not changed last visit.   Lab Results  Component Value Date   TSH 4.07 10/09/2017  .  Patient is on Vitamin D supplement.   Lab Results  Component Value Date   VD25OH 28 (L) 11/19/2014     BMI is Body mass index is 29.85 kg/m., she is working on diet and exercise. Wt Readings from Last 3 Encounters:  12/19/17 163 lb 3.2 oz (74 kg)  10/09/17 162 lb 12.8 oz (73.8 kg)  05/02/17 159 lb (72.1 kg)    Medication Review: Current Outpatient Medications on File Prior to Visit  Medication Sig Dispense Refill  . ALPRAZolam (XANAX) 1 MG tablet 1/2-1 tablet as needed for sleep/anxiety 30 tablet 0  . amphetamine-dextroamphetamine (ADDERALL) 20 MG tablet Take 1/2 to 1 tablet daily if needed for ADD and please try to limit to 5 days /week to avoid addiction Rx to last 6 weeks 30 tablet 0  . clobetasol (  TEMOVATE) 0.05 % external solution Apply 1 application topically 2 (two) times daily. 50 mL 0  . conjugated estrogens (PREMARIN) vaginal cream Place 1 Applicatorful vaginally daily. 30 g 2  . EPINEPHrine 0.15 MG/0.15ML IJ injection Inject 0.15 mLs (0.15 mg total) into the muscle as needed for anaphylaxis. 1 Device 2  . escitalopram (LEXAPRO) 10 MG tablet TAKE 1 TABLET BY MOUTH EVERY DAY 90 tablet 2  . ketoconazole (NIZORAL) 2 % cream APPLY TO AFFECTED AREA(S) TWO TIMES A DAY 60 g 3  . lidocaine (LIDODERM) 5 % Place 1 patch onto the skin daily. Remove & Discard patch within 12 hours or as directed by MD 30 patch 3  . meclizine (ANTIVERT) 25 MG tablet  1/2-1 pill up to 3 times daily for motion sickness/dizziness 30 tablet 0  . meloxicam (MOBIC) 15 MG tablet PLEASE SEE ATTACHED FOR DETAILED DIRECTIONS 30 tablet 1  . omeprazole (PRILOSEC) 40 MG capsule Take 1 capsule (40 mg total) by mouth daily. 90 capsule 1  . SYNTHROID 125 MCG tablet Take 1 tablet (125 mcg total) by mouth daily before breakfast. 90 tablet 1  . triamcinolone ointment (KENALOG) 0.1 % Apply 1 application topically 2 (two) times daily. 80 g 1   No current facility-administered medications on file prior to visit.     Allergies  Allergen Reactions  . Levofloxacin     Neck sweling/difficult breathing  . Cefprozil   . Codeine     REACTION: vomiting  . Hydrocodone-Acetaminophen   . Morphine   . Pravastatin Swelling    Current Problems (verified) Patient Active Problem List   Diagnosis Date Noted  . Medication management 06/23/2014  . Prediabetes 03/27/2014  . Vaginal atrophy 03/27/2014  . Hyperlipidemia   . Hypertension   . Anxiety   . GERD (gastroesophageal reflux disease)   . ADD (attention deficit disorder)   . Depression   . Vitamin D deficiency   . Hypothyroidism 04/25/2008  . Iron deficiency anemia 04/25/2008  . Obstructive sleep apnea 11/16/2007  . Allergic rhinitis 11/16/2007  . Headache 11/16/2007  . PULMONARY EMBOLISM, HX OF 11/16/2007    Review of Systems  Constitutional: Negative.   HENT: Negative.   Eyes: Negative.   Respiratory: Negative.   Cardiovascular: Negative.   Gastrointestinal: Negative.   Genitourinary: Negative.   Musculoskeletal: Positive for joint pain.  Skin: Negative.   Neurological: Negative.   Endo/Heme/Allergies: Negative.   Psychiatric/Behavioral: Negative.     Objective:     Today's Vitals   12/19/17 1120  BP: 138/76  Pulse: 62  Resp: 16  Temp: 97.6 F (36.4 C)  SpO2: 97%  Weight: 163 lb 3.2 oz (74 kg)  Height: 5\' 2"  (1.575 m)   Body mass index is 29.85 kg/m.  General appearance: alert, no distress,  WD/WN, female HEENT: normocephalic, sclerae anicteric, TMs pearly, nares patent, no discharge or erythema, pharynx normal Oral cavity: MMM, no lesions Neck: supple, no lymphadenopathy, no thyromegaly, no masses Heart: RRR, normal S1, S2 prominent S2, no murmurs Lungs: CTA bilaterally, no wheezes, rhonchi, or rales, + chest pain with palpation Abdomen: +bs, soft, non tender, non distended, no masses, no hepatomegaly, no splenomegaly Musculoskeletal: nontender, no swelling, no obvious deformity, tender pin point lateral left knee Extremities: no edema, no cyanosis, no clubbing Pulses: 2+ symmetric, upper and lower extremities, normal cap refill Neurological: alert, oriented x 3, CN2-12 intact, strength normal upper extremities and lower extremities, sensation normal throughout, DTRs 2+ throughout, no cerebellar signs, gait normal Psychiatric: normal  affect, behavior normal, pleasant     Quentin Mulling, PA-C   12/19/2017

## 2017-12-19 ENCOUNTER — Encounter: Payer: Self-pay | Admitting: Physician Assistant

## 2017-12-19 ENCOUNTER — Ambulatory Visit (HOSPITAL_COMMUNITY)
Admission: RE | Admit: 2017-12-19 | Discharge: 2017-12-19 | Disposition: A | Discharge status: Home / Self Care | Payer: Medicare Other | Source: Ambulatory Visit | Attending: Physician Assistant | Admitting: Physician Assistant

## 2017-12-19 ENCOUNTER — Other Ambulatory Visit: Payer: Self-pay

## 2017-12-19 ENCOUNTER — Ambulatory Visit (INDEPENDENT_AMBULATORY_CARE_PROVIDER_SITE_OTHER): Payer: Medicare Other | Admitting: Physician Assistant

## 2017-12-19 VITALS — BP 138/76 | HR 62 | Temp 97.6°F | Resp 16 | Ht 62.0 in | Wt 163.2 lb

## 2017-12-19 DIAGNOSIS — J309 Allergic rhinitis, unspecified: Secondary | ICD-10-CM | POA: Diagnosis not present

## 2017-12-19 DIAGNOSIS — D509 Iron deficiency anemia, unspecified: Secondary | ICD-10-CM

## 2017-12-19 DIAGNOSIS — N952 Postmenopausal atrophic vaginitis: Secondary | ICD-10-CM | POA: Diagnosis not present

## 2017-12-19 DIAGNOSIS — I10 Essential (primary) hypertension: Secondary | ICD-10-CM

## 2017-12-19 DIAGNOSIS — Z114 Encounter for screening for human immunodeficiency virus [HIV]: Secondary | ICD-10-CM | POA: Diagnosis not present

## 2017-12-19 DIAGNOSIS — E039 Hypothyroidism, unspecified: Secondary | ICD-10-CM | POA: Diagnosis not present

## 2017-12-19 DIAGNOSIS — S8412XA Injury of peroneal nerve at lower leg level, left leg, initial encounter: Secondary | ICD-10-CM | POA: Diagnosis not present

## 2017-12-19 DIAGNOSIS — K219 Gastro-esophageal reflux disease without esophagitis: Secondary | ICD-10-CM | POA: Diagnosis not present

## 2017-12-19 DIAGNOSIS — E785 Hyperlipidemia, unspecified: Secondary | ICD-10-CM | POA: Diagnosis not present

## 2017-12-19 DIAGNOSIS — F3342 Major depressive disorder, recurrent, in full remission: Secondary | ICD-10-CM | POA: Diagnosis not present

## 2017-12-19 DIAGNOSIS — R7303 Prediabetes: Secondary | ICD-10-CM | POA: Diagnosis not present

## 2017-12-19 DIAGNOSIS — E559 Vitamin D deficiency, unspecified: Secondary | ICD-10-CM | POA: Diagnosis not present

## 2017-12-19 DIAGNOSIS — G4733 Obstructive sleep apnea (adult) (pediatric): Secondary | ICD-10-CM

## 2017-12-19 DIAGNOSIS — F419 Anxiety disorder, unspecified: Secondary | ICD-10-CM

## 2017-12-19 DIAGNOSIS — Z86718 Personal history of other venous thrombosis and embolism: Secondary | ICD-10-CM

## 2017-12-19 DIAGNOSIS — Z0001 Encounter for general adult medical examination with abnormal findings: Secondary | ICD-10-CM

## 2017-12-19 DIAGNOSIS — R519 Headache, unspecified: Secondary | ICD-10-CM

## 2017-12-19 DIAGNOSIS — R51 Headache: Secondary | ICD-10-CM

## 2017-12-19 DIAGNOSIS — Z79899 Other long term (current) drug therapy: Secondary | ICD-10-CM | POA: Diagnosis not present

## 2017-12-19 DIAGNOSIS — F988 Other specified behavioral and emotional disorders with onset usually occurring in childhood and adolescence: Secondary | ICD-10-CM

## 2017-12-19 DIAGNOSIS — M25562 Pain in left knee: Secondary | ICD-10-CM | POA: Diagnosis not present

## 2017-12-19 DIAGNOSIS — Z1159 Encounter for screening for other viral diseases: Secondary | ICD-10-CM

## 2017-12-19 MED ORDER — VITAMIN D (ERGOCALCIFEROL) 1.25 MG (50000 UNIT) PO CAPS: ORAL_CAPSULE | ORAL | 3 refills | Status: DC

## 2017-12-19 NOTE — Patient Instructions (Addendum)
Get the left knee x ray Put heat on the vein there and elevate your leg Do mobic x 2 weeks   Superficial Peroneal Nerve Entrapment Superficial peroneal nerve entrapment is a condition that results from pressure on a nerve in the lower leg (superficial peroneal nerve). This nerve provides feeling to the outside half of the front of your lower leg and the top of your foot and toes. It also supplies the outer muscles of your lower leg that help your foot move outward. The superficial peroneal nerve begins below the outside of your knee and runs down along your lower leg bone (fibula) to the top of your foot. Superficial peroneal nerve entrapment can cause weakness and numbness in your leg and foot. It may also cause pain. This can happen anywhere from your lower leg to your ankle. What are the causes? This condition may be caused by:  A hard, direct hit to the outside of your lower leg.  Ankle sprains.  A break (fracture) in the fibula.  What increases the risk? This condition is more likely to develop in people who take part in certain sports or activities, such as:  Ballet dancing.  Contact sports, such as football, lacrosse, or martial arts.  Sports that involve changing direction quickly, such as soccer.  Sports that take place on uneven surfaces, such as trail running.  Sports that involve wearing high boots, such as skiing.  What are the signs or symptoms? Symptoms of this condition include:  Numbness and tingling over the outside half of your shin and the top of your foot and toes.  Pain or tenderness on the outside of your leg or top of your foot.  Inability to move your foot outward or weakness when doing so.  Symptoms of this condition may start quickly or may develop over time. They often get better with rest and get worse after activity. How is this diagnosed? This condition may be diagnosed based on:  Your symptoms and medical history.  A physical exam. During  the exam, your health care provider may: ? Check for numbness and test the strength of your lower leg muscles. ? Tap the side of your leg or ankle to see if it causes a tingling sensation. ? Inject a numbing medicine into the nerve to see if your symptoms go away.  Imaging tests, such as: ? X-rays to check your ankle and fibula for fractures. ? MRI to check tendons and ligaments. ? Ultrasound to check the nerve. ? An electrical study of the nerve's function (electromyography, or EMG).  How is this treated? Treatment for this condition may include:  Avoiding activities that make symptoms worse.  Taking anti-inflammatory pain medicines to relieve swelling and reduce pain.  Having medicines injected near your nerve to reduce pain and swelling.  Starting range-of-motion exercises and strengthening exercises.  Returning gradually to full activity.  Wearing a supportive shoe or a shoe insert (orthotic).  Surgery to take pressure off of the nerve. This may be needed if there is no improvement in 2-3 months or if there is a growth on the nerve.  Follow these instructions at home: Activity  Return to your normal activities as told by your health care provider. Ask your health care provider what activities are safe for you.  Do not do any activities that make pain or swelling worse.  Do not put full weight on your ankle until your health care provider says that you can.  Do exercises as told  by your health care provider. General instructions  Wear your supportive shoe or your shoe insert as told by your health care provider.  Take over-the-counter and prescription medicines only as told by your health care provider.  Keep all follow-up visits as told by your health care provider. This is important. How is this prevented?  Wear supportive footwear that is appropriate for your athletic activity.  Make sure your shoes or boots fit well and are not too tight.  See your health  care provider if you have an ankle sprain that causes pain and swelling for more than 2 weeks.  If you start a new athletic activity, start gradually to build up your strength and flexibility. Contact a health care provider if:  Your symptoms do not improve in 2-3 months.  You have increasing weakness or numbness in your leg or foot. This information is not intended to replace advice given to you by your health care provider. Make sure you discuss any questions you have with your health care provider. Document Released: 02/07/2005 Document Revised: 10/13/2015 Document Reviewed: 12/31/2014 Elsevier Interactive Patient Education  [employer].

## 2017-12-20 LAB — CBC WITH DIFFERENTIAL/PLATELET
Basophils Absolute: 90 cells/uL (ref 0–200)
Basophils Relative: 1 %
Eosinophils Absolute: 270 cells/uL (ref 15–500)
Eosinophils Relative: 3 %
HCT: 48.4 % — ABNORMAL HIGH (ref 35.0–45.0)
Hemoglobin: 16.2 g/dL — ABNORMAL HIGH (ref 11.7–15.5)
Lymphs Abs: 2259 cells/uL (ref 850–3900)
MCH: 29 pg (ref 27.0–33.0)
MCHC: 33.5 g/dL (ref 32.0–36.0)
MCV: 86.7 fL (ref 80.0–100.0)
MPV: 10 fL (ref 7.5–12.5)
Monocytes Relative: 7.5 %
Neutro Abs: 5706 cells/uL (ref 1500–7800)
Neutrophils Relative %: 63.4 %
Platelets: 406 10*3/uL — ABNORMAL HIGH (ref 140–400)
RBC: 5.58 10*6/uL — ABNORMAL HIGH (ref 3.80–5.10)
RDW: 12.6 % (ref 11.0–15.0)
Total Lymphocyte: 25.1 %
WBC mixed population: 675 cells/uL (ref 200–950)
WBC: 9 10*3/uL (ref 3.8–10.8)

## 2017-12-20 LAB — HIV ANTIBODY (ROUTINE TESTING W REFLEX): HIV 1&2 Ab, 4th Generation: NONREACTIVE

## 2017-12-20 LAB — COMPLETE METABOLIC PANEL WITH GFR
AG Ratio: 1.7 (calc) (ref 1.0–2.5)
ALT: 23 U/L (ref 6–29)
AST: 25 U/L (ref 10–35)
Albumin: 4.6 g/dL (ref 3.6–5.1)
Alkaline phosphatase (APISO): 90 U/L (ref 33–130)
BUN: 10 mg/dL (ref 7–25)
CO2: 27 mmol/L (ref 20–32)
Calcium: 10.2 mg/dL (ref 8.6–10.4)
Chloride: 102 mmol/L (ref 98–110)
Creat: 0.93 mg/dL (ref 0.50–0.99)
GFR, Est African American: 75 mL/min/{1.73_m2} (ref >=60)
GFR, Est Non African American: 64 mL/min/{1.73_m2} (ref >=60)
Globulin: 2.7 g/dL (calc) (ref 1.9–3.7)
Glucose, Bld: 84 mg/dL (ref 65–99)
Potassium: 4.8 mmol/L (ref 3.5–5.3)
Sodium: 141 mmol/L (ref 135–146)
Total Bilirubin: 0.5 mg/dL (ref 0.2–1.2)
Total Protein: 7.3 g/dL (ref 6.1–8.1)

## 2017-12-20 LAB — LIPID PANEL
Cholesterol: 248 mg/dL — ABNORMAL HIGH (ref <200)
HDL: 53 mg/dL (ref >50)
LDL Cholesterol (Calc): 143 mg/dL (calc) — ABNORMAL HIGH
Non-HDL Cholesterol (Calc): 195 mg/dL (calc) — ABNORMAL HIGH (ref <130)
Total CHOL/HDL Ratio: 4.7 (calc) (ref <5.0)
Triglycerides: 341 mg/dL — ABNORMAL HIGH (ref <150)

## 2017-12-20 LAB — HEPATITIS C ANTIBODY
Hepatitis C Ab: NONREACTIVE
SIGNAL TO CUT-OFF: 0.01 (ref <1.00)

## 2017-12-20 LAB — TSH: TSH: 4.75 mIU/L — ABNORMAL HIGH (ref 0.40–4.50)

## 2018-01-09 ENCOUNTER — Other Ambulatory Visit: Payer: Self-pay | Admitting: Internal Medicine

## 2018-01-09 DIAGNOSIS — Z1231 Encounter for screening mammogram for malignant neoplasm of breast: Secondary | ICD-10-CM

## 2018-01-26 ENCOUNTER — Encounter: Payer: Self-pay | Admitting: Physician Assistant

## 2018-02-10 ENCOUNTER — Other Ambulatory Visit: Payer: Self-pay | Admitting: Adult Health

## 2018-02-10 DIAGNOSIS — M542 Cervicalgia: Secondary | ICD-10-CM

## 2018-02-15 ENCOUNTER — Encounter: Payer: Self-pay | Admitting: Physician Assistant

## 2018-02-18 ENCOUNTER — Other Ambulatory Visit: Payer: Self-pay | Admitting: Physician Assistant

## 2018-02-26 ENCOUNTER — Encounter: Payer: Self-pay | Admitting: Internal Medicine

## 2018-02-26 ENCOUNTER — Ambulatory Visit
Admission: RE | Admit: 2018-02-26 | Discharge: 2018-02-26 | Disposition: A | Discharge status: Home / Self Care | Payer: Medicare Other | Source: Ambulatory Visit | Attending: Internal Medicine | Admitting: Internal Medicine

## 2018-02-26 DIAGNOSIS — Z1231 Encounter for screening mammogram for malignant neoplasm of breast: Secondary | ICD-10-CM

## 2018-03-09 ENCOUNTER — Encounter: Payer: Self-pay | Admitting: Physician Assistant

## 2018-03-09 ENCOUNTER — Ambulatory Visit (INDEPENDENT_AMBULATORY_CARE_PROVIDER_SITE_OTHER): Payer: Medicare Other | Admitting: Physician Assistant

## 2018-03-09 VITALS — BP 134/76 | HR 77 | Temp 98.0°F | Ht 62.0 in | Wt 164.8 lb

## 2018-03-09 DIAGNOSIS — E039 Hypothyroidism, unspecified: Secondary | ICD-10-CM

## 2018-03-09 DIAGNOSIS — L539 Erythematous condition, unspecified: Secondary | ICD-10-CM | POA: Diagnosis not present

## 2018-03-09 DIAGNOSIS — E348 Other specified endocrine disorders: Secondary | ICD-10-CM | POA: Diagnosis not present

## 2018-03-09 DIAGNOSIS — F419 Anxiety disorder, unspecified: Secondary | ICD-10-CM

## 2018-03-09 DIAGNOSIS — F3342 Major depressive disorder, recurrent, in full remission: Secondary | ICD-10-CM

## 2018-03-09 DIAGNOSIS — L570 Actinic keratosis: Secondary | ICD-10-CM | POA: Diagnosis not present

## 2018-03-09 LAB — TSH: TSH: 2.11 mIU/L (ref 0.40–4.50)

## 2018-03-09 MED ORDER — VENLAFAXINE HCL ER 37.5 MG PO CP24: 37.5000 mg | ORAL_CAPSULE | Freq: Two times a day (BID) | ORAL | 0 refills | Status: DC

## 2018-03-09 NOTE — Progress Notes (Signed)
Chief Complaint: Patient presents for evaluation of skin lesions. Patient has erythematous, scaly lesions on lower leg, changing in shape.   Has been having twitching with toes and fingers, stopped the lexapro and it has helped, still doing it in her toes. She has anxiety and hot flashes  She is on her thyroid 1 pill 4 days a week and 1/2 3 days a week.  Lab Results  Component Value Date   TSH 4.75 (H) 12/19/2017   .   Blood pressure 134/76, pulse 77, temperature 98 F (36.7 C), height 5\' 2"  (1.575 m), weight 164 lb 12.8 oz (74.8 kg), SpO2 99 %.   Exam: normal complete skin exam, no suspicious lesions, seborrheic keratoses  Procedure Details   The risks, benefits, indications, potential complications, and alternatives were explained to the patient and informed consent obtained.  Liquid nitrogen was use in a 3 freeze and thaw technique. The patient tolerated the procedure well.   Condition: Stable  Complications:  None  Diagnosis: Seb. Dermatitis Anxiety  Procedure code:  16109 [id number]  Plan: 1. Patient educated that the area will begin to heal in approximately a week.  2. Warning signs of infection were reviewed.    3. Recommended that the patient use OTC acetaminophen as needed for pain.  4. Try effexor 37.5mg  and can try to increase to 75mg   5. Check TSH 6. Get DEXA, estrogen def  Future Appointments  Date Time Provider Department Center  05/16/2018 10:45 AM Quentin Mulling, PA-C [facility] None  01/01/2019 10:00 AM Quentin Mulling, PA-C [facility] None

## 2018-03-09 NOTE — Patient Instructions (Addendum)
Intermittent fasting is more about strategy than starvation. It's meant to reset your body in different ways, hopefully with fitness and nutrition changes as a result.  Like any big switchover, though, results may vary when it comes down to the individual level. What works for your friends may not work for you, or vice versa. That's why it's helpful to play around with variations on intermittent fasting and healthy habits and find what works best for you.  WHAT IS INTERMITTENT FASTING AND WHY DO IT?  Intermittent fasting doesn't involve specific foods, but rather, a strict schedule regarding when you eat. Also called "time-restricted eating," the tactic has been praised for its contribution to weight loss, improved body composition, and decreased cravings. Preliminary research also suggests it may be beneficial for glucose tolerance, hormone regulation, better muscle mass and lower body fat.  Part of its appeal is the simplicity of the effort. Unlike some other trends, there's no calculations to intermittent fasting.  You simply eat within a certain block of time, usually a window of 8-10 hours. In the other big block of time - about 14-16 hours, including when you're asleep - you don't eat anything, not even snacks. You can drink water, coffee, tea or any other beverage that doesn't have calories.  For example, if you like having a late dinner, you might skip breakfast and have your first meal at noon and your last meal of the day at 8 p.m., and then not eat until noon again the next day.  IDEAS FOR GETTING STARTED  If you're new to the strategy, it may be helpful to eat within the typical circadian rhythm and keep eating within daylight hours. This can be especially beneficial if you're looking at intermittent fasting for weight-loss goals.  So first try only eating between 12pm to 8pm.  Outside of this time you may have water, black coffee, and hot tea. You may not eat it drink anything that  has carbs, sugars, OR artificial sugars like diet soda.   Like any major eating and fitness shift, it can take time to find the perfect fit, so don't be afraid to experiment with different options - including ditching intermittent fasting altogether if it's simply not for you. But if it is, you may be surprised by some of the benefits that come along with the strategy.    

## 2018-03-14 DIAGNOSIS — R5381 Other malaise: Secondary | ICD-10-CM | POA: Diagnosis not present

## 2018-03-14 DIAGNOSIS — R5383 Other fatigue: Secondary | ICD-10-CM | POA: Diagnosis not present

## 2018-03-14 DIAGNOSIS — R112 Nausea with vomiting, unspecified: Secondary | ICD-10-CM | POA: Diagnosis not present

## 2018-03-14 DIAGNOSIS — K529 Noninfective gastroenteritis and colitis, unspecified: Secondary | ICD-10-CM | POA: Diagnosis not present

## 2018-04-01 ENCOUNTER — Other Ambulatory Visit: Payer: Self-pay | Admitting: Physician Assistant

## 2018-04-01 DIAGNOSIS — F419 Anxiety disorder, unspecified: Secondary | ICD-10-CM

## 2018-04-02 ENCOUNTER — Other Ambulatory Visit: Payer: Self-pay

## 2018-04-02 DIAGNOSIS — F419 Anxiety disorder, unspecified: Secondary | ICD-10-CM

## 2018-04-02 MED ORDER — VENLAFAXINE HCL ER 37.5 MG PO CP24: 37.5000 mg | ORAL_CAPSULE | Freq: Every day | ORAL | 1 refills | Status: DC

## 2018-04-02 NOTE — Telephone Encounter (Signed)
Per patient's request only takes 1 tablet daily and would like her Rx to say that.  Has been updated per request

## 2018-04-04 NOTE — Progress Notes (Signed)
[facility] °

## 2018-04-10 ENCOUNTER — Telehealth: Payer: Self-pay | Admitting: Physician Assistant

## 2018-04-10 DIAGNOSIS — F988 Other specified behavioral and emotional disorders with onset usually occurring in childhood and adolescence: Secondary | ICD-10-CM

## 2018-04-10 MED ORDER — AMPHETAMINE-DEXTROAMPHETAMINE 20 MG PO TABS: ORAL_TABLET | ORAL | 0 refills | Status: DC

## 2018-04-10 NOTE — Telephone Encounter (Signed)
-----   Message from Gregery Na, CMA sent at 04/10/2018  3:31 PM EST ----- Regarding: refill Contact: [phone] PER PT/YELLOW NOTE:  Refill on ADDERALL Please & thank you!  [facility] [address]

## 2018-05-15 NOTE — Progress Notes (Signed)
MEDICARE ANNUAL WELLNESS VISIT AND FOLLOW UP  Assessment:   BMI 29.0-29.9,adult  Overweight  - long discussion about weight loss, diet, and exercise -recommended diet heavy in fruits and veggies and low in animal meats, cheeses, and dairy products  Essential hypertension - continue medications, DASH diet, exercise and monitor at home. Call if greater than 130/80.  -     [place], RETROPERITNL ABD,  LTD -     CBC with Differential/Platelet -     BASIC METABOLIC PANEL WITH GFR -     Hepatic function panel -     TSH  Hypothyroidism, unspecified type Hypothyroidism-check TSH level, continue medications the same, reminded to take on an empty stomach 30-62mins before food.  -     TSH  PULMONARY EMBOLISM, HX OF Monitor, was provoked after breaking leg  Hyperlipidemia, unspecified hyperlipidemia type -continue medications, check lipids, decrease fatty foods, increase activity.   Recurrent major depressive disorder, in full remission (HCC) - continue medications, stress management techniques discussed, increase water, good sleep hygiene discussed, increase exercise, and increase veggies.  Increase effexor to 2 a day, or 75mg   Medication management -     Magnesium  Mallet Finger Splint x 8-10 weeks, if not better can refer to ortho  Advanced health discussion Filled out DNR yellow form for patient, given to patient.   Over 40 minutes of exam, counseling, chart review and critical decision making was performed Future Appointments  Date Time Provider Department Center  06/13/2018 10:30 AM [facility] DX DEXA 1 [facility]  01/01/2019 10:00 AM Quentin Mulling, PA-C [facility] None    Plan:   During the course of the visit the patient was educated and counseled about appropriate screening and preventive services including:    Pneumococcal vaccine   Prevnar 13  Influenza vaccine  Td vaccine  Screening electrocardiogram  Bone densitometry screening  Colorectal cancer  screening  Diabetes screening  Glaucoma screening  Nutrition counseling   Advanced directives: requested   Subjective:  Diana Wolfe is a 68 y.o. right handed female female who presents for Medicare Annual Wellness Visit and 3 month follow up.   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 It is not painful at this time but she is unable to straighten it.    Her blood pressure has been controlled at home, today their BP is BP: 140/80 She does not workout, she is active with her dog. She denies chest pain, shortness of breath, dizziness.  She is not on cholesterol medication and denies myalgias. Her cholesterol is at goal. The cholesterol last visit was:   Lab Results  Component Value Date   CHOL 248 (H) 12/19/2017   HDL 53 12/19/2017   LDLCALC 143 (H) 12/19/2017   LDLDIRECT 138.2 04/25/2008   TRIG 341 (H) 12/19/2017   CHOLHDL 4.7 12/19/2017   She is on the adderall, 1/2 tablet 1-2 x a day mainly when she drives. She has urinary frequency during the day as well, declines incontinence, does have urgency. She does have vaginal atrophy and does premarin 2 x a week.  She has chronic joint pain bilateral feet and left knee, on meloxicam 2 days a week.    Lab Results  Component Value Date   GFRNONAA 64 12/19/2017    Last A1C in the office was:  Lab Results  Component Value Date   HGBA1C 5.5 06/23/2015   Last  GFR: Lab Results  Component Value Date   GFRNONAA 64 12/19/2017   She is on thyroid medication. Her medication was not changed last visit.   Lab Results  Component Value Date   TSH 2.11 03/09/2018  .  Patient is on Vitamin D supplement.   Lab Results  Component Value Date   VD25OH 28 (L) 11/19/2014     BMI is Body mass index is 29.85 kg/m., she is working on diet and exercise. Wt Readings from Last 3 Encounters:  05/16/18 163 lb 3.2 oz (74 kg)  03/09/18 164 lb 12.8 oz (74.8  kg)  12/19/17 163 lb 3.2 oz (74 kg)    Medication Review: Current Outpatient Medications on File Prior to Visit  Medication Sig Dispense Refill  . amphetamine-dextroamphetamine (ADDERALL) 20 MG tablet Take 1/2 to 1 tablet daily if needed for ADD and please try to limit to 5 days /week to avoid addiction Rx to last 6 weeks 30 tablet 0  . clobetasol (TEMOVATE) 0.05 % external solution Apply 1 application topically 2 (two) times daily. 50 mL 0  . conjugated estrogens (PREMARIN) vaginal cream Place 1 Applicatorful vaginally daily. 30 g 2  . ketoconazole (NIZORAL) 2 % cream APPLY TO AFFECTED AREA(S) TWO TIMES A DAY (Patient taking differently: as needed. ) 60 g 3  . lidocaine (LIDODERM) 5 % Place 1 patch onto the skin daily. Remove & Discard patch within 12 hours or as directed by MD 30 patch 3  . meclizine (ANTIVERT) 25 MG tablet 1/2-1 pill up to 3 times daily for motion sickness/dizziness (Patient taking differently: as needed. 1/2-1 pill up to 3 times daily for motion sickness/dizziness) 30 tablet 0  . meloxicam (MOBIC) 15 MG tablet Take 1/2 to 1 tablet Daily with food for Pain & Inflammation (Patient taking differently: as needed. Take 1/2 to 1 tablet Daily with food for Pain & Inflammation) 90 tablet 1  . omeprazole (PRILOSEC) 40 MG capsule Take 1 capsule (40 mg total) by mouth daily. 90 capsule 1  . SYNTHROID 125 MCG tablet TAKE 1 TABLET BY MOUTH EVERY DAY BEFORE BREAKFAST 90 tablet 1  . triamcinolone ointment (KENALOG) 0.1 % Apply 1 application topically 2 (two) times daily. (Patient taking differently: Apply 1 application topically as needed. ) 80 g 1  . venlafaxine XR (EFFEXOR-XR) 37.5 MG 24 hr capsule Take 1 capsule (37.5 mg total) by mouth daily. 90 capsule 1  . vitamin C (ASCORBIC ACID) 500 MG tablet Take 1,200 mg by mouth daily.    . Vitamin D, Ergocalciferol, (DRISDOL) 50000 units CAPS capsule TAKE 1 CAPSULE BY MOUTH 3 DAYS A WEEK. 12 capsule 3  . ALPRAZolam (XANAX) 1 MG tablet 1/2-1  tablet as needed for sleep/anxiety 30 tablet 0  . EPINEPHrine 0.15 MG/0.15ML IJ injection Inject 0.15 mLs (0.15 mg total) into the muscle as needed for anaphylaxis. (Patient not taking: Reported on 05/16/2018) 1 Device 2   No current facility-administered medications on file prior to visit.     Allergies  Allergen Reactions  . Levofloxacin     Neck sweling/difficult breathing  . Cefprozil   . Codeine     REACTION: vomiting  . Hydrocodone-Acetaminophen   . Morphine   . Pravastatin Swelling  . Lexapro [Escitalopram Oxalate] Other (See Comments)    Tapping/abnormal movements    Current Problems (verified) Patient Active Problem List   Diagnosis Date Noted  . Medication management 06/23/2014  . Abnormal glucose 03/27/2014  . Vaginal atrophy 03/27/2014  . Hyperlipidemia   .  Hypertension   . Anxiety   . GERD (gastroesophageal reflux disease)   . ADD (attention deficit disorder)   . Depression   . Vitamin D deficiency   . Hypothyroidism 04/25/2008  . Iron deficiency anemia 04/25/2008  . Obstructive sleep apnea 11/16/2007  . Allergic rhinitis 11/16/2007  . Headache 11/16/2007  . PULMONARY EMBOLISM, HX OF 11/16/2007    Screening Tests Immunization History  Administered Date(s) Administered  . Influenza Whole 11/22/2007  . Influenza-Unspecified 10/28/2016  . Pneumococcal Conjugate-13 05/02/2017  . Td 04/25/2008  . Varicella Zoster Immune Globulin 12/31/2014   Preventative care: Last colonoscopy: 2015 Dr.Magod due 2020 EGD 2017 Last mammogram: 02/2018 Last pap smear/pelvic exam: 2014 Dr. Seymour Bars DEXA: 2007 US soft tissue head 2018 MRI lumbar 2010  Prior vaccinations: TD or Tdap: 2020  Influenza: 2019 Pneumococcal: after prevnar Prevnar13: 2019 Shingles/Zostavax: 2016  Names of Other Physician/Practitioners you currently use: 1. [facility] here for primary care 2. [facility], eye doctor, last visit May 2019 3. Dr. Andrey Campanile,  dentist, last visit yearly Patient Care Team: Lucky Cowboy, MD as PCP - General (Internal Medicine)  SURGICAL HISTORY She  has a past surgical history that includes Foot surgery (Right); Cesarean section; Tonsillectomy (2002); and Esophagogastroduodenoscopy (N/A, 09/02/2015). FAMILY HISTORY Her family history includes Cancer (age of onset: 73) in her mother; Heart disease in her father; Hypertension in her father. SOCIAL HISTORY She  reports that she has never smoked. She has never used smokeless tobacco. She reports current alcohol use. She reports that she does not use drugs.   MEDICARE WELLNESS OBJECTIVES: Physical activity:   Cardiac risk factors:   Depression/mood screen:   Depression screen [facility] 2/9 05/02/2017  Decreased Interest 0  Down, Depressed, Hopeless 0  PHQ - 2 Score 0    ADLs:  No flowsheet data found.  Cognitive Testing  Alert? Yes  Normal Appearance?Yes  Oriented to person? Yes  Place? Yes   Time? Yes  Recall of three objects?  Yes  Can perform simple calculations? Yes  Displays appropriate judgment?Yes  Can read the correct time from a watch face?Yes  EOL planning:    Review of Systems  Constitutional: Negative.   HENT: Negative.   Eyes: Negative.   Respiratory: Negative.   Cardiovascular: Negative.   Gastrointestinal: Negative.   Genitourinary: Negative.   Musculoskeletal: Negative.   Skin: Negative.     Objective:     Today's Vitals   05/16/18 1046  BP: 140/80  Pulse: 88  Temp: (!) 96.9 F (36.1 C)  SpO2: 94%  Weight: 163 lb 3.2 oz (74 kg)  Height:  (1.575 m)   Body mass index is 29.85 kg/m.  General appearance: alert, no distress, WD/WN, female HEENT: normocephalic, sclerae anicteric, TMs pearly, nares patent, no discharge or erythema, pharynx normal Oral cavity: MMM, no lesions Neck: supple, no lymphadenopathy, no thyromegaly, no masses Heart: RRR, normal S1, S2 prominent S2, no murmurs Lungs: CTA bilaterally, no wheezes,  rhonchi, or rales Abdomen: +bs, soft, non tender, non distended, no masses, no hepatomegaly, no splenomegaly Musculoskeletal: nontender, no swelling, left middle finger with flexion at DIP, able to extend without discomfort, no redness, warmth, swelling.  Extremities: no edema, no cyanosis, no clubbing Pulses: 2+ symmetric, upper and lower extremities, normal cap refill Neurological: alert, oriented x 3, CN2-12 intact, strength normal upper extremities and lower extremities, sensation normal throughout, DTRs 2+ throughout, no cerebellar signs, gait normal Psychiatric: normal affect, behavior normal, pleasant   Medicare Attestation  I have personally reviewed: The patient's medical and social history Their use of alcohol, tobacco or illicit drugs Their current medications and supplements The patient's functional ability including ADLs,fall risks, home safety risks, cognitive, and hearing and visual impairment Diet and physical activities Evidence for depression or mood disorders  The patient's weight, height, BMI, and visual acuity have been recorded in the chart.  I have made referrals, counseling, and provided education to the patient based on review of the above and I have provided the patient with a written personalized care plan for preventive services.     Quentin Mulling, PA-C   05/16/2018

## 2018-05-16 ENCOUNTER — Ambulatory Visit (INDEPENDENT_AMBULATORY_CARE_PROVIDER_SITE_OTHER): Payer: Medicare Other | Admitting: Physician Assistant

## 2018-05-16 ENCOUNTER — Encounter: Payer: Self-pay | Admitting: Physician Assistant

## 2018-05-16 ENCOUNTER — Other Ambulatory Visit: Payer: Self-pay

## 2018-05-16 VITALS — BP 140/80 | HR 88 | Temp 96.9°F | Ht 62.0 in | Wt 163.2 lb

## 2018-05-16 DIAGNOSIS — K219 Gastro-esophageal reflux disease without esophagitis: Secondary | ICD-10-CM

## 2018-05-16 DIAGNOSIS — I10 Essential (primary) hypertension: Secondary | ICD-10-CM | POA: Diagnosis not present

## 2018-05-16 DIAGNOSIS — F419 Anxiety disorder, unspecified: Secondary | ICD-10-CM | POA: Diagnosis not present

## 2018-05-16 DIAGNOSIS — R6889 Other general symptoms and signs: Secondary | ICD-10-CM | POA: Diagnosis not present

## 2018-05-16 DIAGNOSIS — E039 Hypothyroidism, unspecified: Secondary | ICD-10-CM | POA: Diagnosis not present

## 2018-05-16 DIAGNOSIS — J309 Allergic rhinitis, unspecified: Secondary | ICD-10-CM | POA: Diagnosis not present

## 2018-05-16 DIAGNOSIS — Z0000 Encounter for general adult medical examination without abnormal findings: Secondary | ICD-10-CM

## 2018-05-16 DIAGNOSIS — Z79899 Other long term (current) drug therapy: Secondary | ICD-10-CM | POA: Diagnosis not present

## 2018-05-16 DIAGNOSIS — D509 Iron deficiency anemia, unspecified: Secondary | ICD-10-CM | POA: Diagnosis not present

## 2018-05-16 DIAGNOSIS — Z0001 Encounter for general adult medical examination with abnormal findings: Secondary | ICD-10-CM

## 2018-05-16 DIAGNOSIS — Z86718 Personal history of other venous thrombosis and embolism: Secondary | ICD-10-CM | POA: Diagnosis not present

## 2018-05-16 DIAGNOSIS — E559 Vitamin D deficiency, unspecified: Secondary | ICD-10-CM

## 2018-05-16 DIAGNOSIS — Z7189 Other specified counseling: Secondary | ICD-10-CM

## 2018-05-16 DIAGNOSIS — R7309 Other abnormal glucose: Secondary | ICD-10-CM

## 2018-05-16 DIAGNOSIS — F988 Other specified behavioral and emotional disorders with onset usually occurring in childhood and adolescence: Secondary | ICD-10-CM | POA: Diagnosis not present

## 2018-05-16 DIAGNOSIS — G4733 Obstructive sleep apnea (adult) (pediatric): Secondary | ICD-10-CM | POA: Diagnosis not present

## 2018-05-16 DIAGNOSIS — Z23 Encounter for immunization: Secondary | ICD-10-CM

## 2018-05-16 DIAGNOSIS — M20012 Mallet finger of left finger(s): Secondary | ICD-10-CM

## 2018-05-16 DIAGNOSIS — E785 Hyperlipidemia, unspecified: Secondary | ICD-10-CM

## 2018-05-16 DIAGNOSIS — F3342 Major depressive disorder, recurrent, in full remission: Secondary | ICD-10-CM

## 2018-05-16 NOTE — Patient Instructions (Addendum)
Your LDL could improve, ideally we want it under a 100.  Your LDL is the bad cholesterol that can lead to heart attack and stroke. To lower your number you can decrease your fatty foods, red meat, cheese, milk and increase fiber like whole grains and veggies. You can also add a fiber supplement like Citracel or Benefiber, these do not cause gas and bloating and are safe to use. Especially if you have a strong family history of heart disease or stroke or you have evidence of plaque on any imaging like a chest xray, we may discuss at your next office visit putting you on a medication to get your number below 100.   Try 2 of the effexor at the same time for 2 weeks If this helps I can send in a 75mg  pill to take once a day   Get splint and wear constantly or as much as possible for your finger for 8-10 weeks If not better will refer to ortho  Mallet Finger  Mallet finger is an injury that occurs when an object hits the tip of your straightened finger or thumb. It is also known as baseball finger. The blow to your fingertip causes it to bend more than normal, which tears the cord that attaches to the tip of your finger (extensor tendon).  Your extensor tendon is what straightens the end of your finger. If this tendon is damaged, you will not be able to straighten your fingertip. Sometimes, a piece of bone may be pulled away with the tendon (avulsion injury), or the tendon may tear completely. In some cases, surgery may be required to repair the damage. What are the causes? Mallet finger is caused by a hard, direct hit to the tip of your finger or thumb. This injury often happens from getting hit in the finger with a hard ball, such as a baseball. What increases the risk? This injury is more likely to happen if you play a sport that uses a hard ball. What are the signs or symptoms? The main symptom of this injury is the inability to straighten the tip of your finger. You can manually straighten your  fingertip with your other hand, but the finger cannot straighten on its own. Other symptoms may include:  Pain.  Swelling.  Bruising.  Blood under the fingernail. How is this diagnosed? Your health care provider may suspect mallet finger if you are not able to extend your fingertip, especially if you recently injured your hand. Your health care provider will do a physical exam. This may include X-rays to see if a piece of bone has been pulled away or if the finger joint has separated (dislocated). How is this treated? Mallet finger may be treated with:  A splint on your fingertip to keep it straight (extended) while the tendon heals.  Surgery to repair the tendon. This is done in severe cases. This may involve: ? Using a pin or screw to keep your finger extended and your tendon attached. ? Using a piece of tendon from another part of your body (graft) to replace a torn tendon. Follow these instructions at home: If you have a splint:  Wear the splint as told by your health care provider. Remove it only as told by your health care provider.  Loosen the splint if your fingers tingle, become numb, or turn cold and blue.  Keep the splint clean.  If the splint is not waterproof: ? Do not let it get wet. ? Cover it  with a watertight covering when you take a bath or a shower.  If you take your splint off to dry it or change it: ? Gently press your finger on a flat surface to keep it straight. Failing to do so may lead to a permanent injury, or force you to wear the splint for a longer period of time. ? Check the skin under the splint. Tell your health care provider if you notice a blister or red and raw skin. Managing pain, stiffness, and swelling   If directed, put ice on the injured area: ? If you have a removable splint, remove it as told by your health care provider. ? Put ice in a plastic bag. ? Place a towel between your skin and the bag. ? Leave the ice on for 20 minutes,  2-3 times a day.  Move your fingers often to avoid stiffness and to lessen swelling.  Raise (elevate)the injured hand above the level of your heart while you are sitting or lying down. General instructions  Take over-the-counter and prescription medicines only as told by your health care provider.  Do not drive or use heavy machinery while taking prescription pain medicine.  Keep all follow-up visits as told by your health care provider. This is important. Contact a health care provider if:  You have pain or swelling that is getting worse.  Your finger feels cold.  You cannot extend your finger after treatment.  You notice that the skin under the splint is red, raw, or has a blister. Get help right away if:  Even after loosening your splint, your finger is: ? Very red and swollen. ? White or blue. ? Numb or tingling. Summary  Mallet finger is an injury that occurs from a hard, direct hit to the tip of your finger or thumb.  The blow to your fingertip causes it to bend more than normal, tearing the tendon that straightens the end of your finger. You cannot straighten your fingertip if this tendon is torn.  This injury often happens from getting hit in the finger with a hard ball, such as a baseball.  Treatment will depend on how severe the injury is. You may need to wear a splint to keep the finger straight while it heals. A more severe injury may require surgery to repair the tendon. This information is not intended to replace advice given to you by your health care provider. Make sure you discuss any questions you have with your health care provider. Document Released: 02/05/2000 Document Revised: 02/20/2017 Document Reviewed: 02/20/2017 Elsevier Interactive Patient Education  2019 [employer].

## 2018-05-17 ENCOUNTER — Other Ambulatory Visit: Payer: Self-pay | Admitting: Physician Assistant

## 2018-05-17 DIAGNOSIS — F419 Anxiety disorder, unspecified: Secondary | ICD-10-CM

## 2018-05-17 LAB — CBC WITH DIFFERENTIAL/PLATELET
Absolute Monocytes: 632 cells/uL (ref 200–950)
Basophils Absolute: 62 cells/uL (ref 0–200)
Basophils Relative: 0.7 %
Eosinophils Absolute: 116 cells/uL (ref 15–500)
Eosinophils Relative: 1.3 %
HCT: 45.3 % — ABNORMAL HIGH (ref 35.0–45.0)
Hemoglobin: 15.3 g/dL (ref 11.7–15.5)
Lymphs Abs: 2296 cells/uL (ref 850–3900)
MCH: 29 pg (ref 27.0–33.0)
MCHC: 33.8 g/dL (ref 32.0–36.0)
MCV: 85.8 fL (ref 80.0–100.0)
MPV: 10.3 fL (ref 7.5–12.5)
Monocytes Relative: 7.1 %
Neutro Abs: 5794 cells/uL (ref 1500–7800)
Neutrophils Relative %: 65.1 %
Platelets: 386 10*3/uL (ref 140–400)
RBC: 5.28 10*6/uL — ABNORMAL HIGH (ref 3.80–5.10)
RDW: 13 % (ref 11.0–15.0)
Total Lymphocyte: 25.8 %
WBC: 8.9 10*3/uL (ref 3.8–10.8)

## 2018-05-17 LAB — COMPLETE METABOLIC PANEL WITH GFR
AG Ratio: 2 (calc) (ref 1.0–2.5)
ALT: 22 U/L (ref 6–29)
AST: 20 U/L (ref 10–35)
Albumin: 4.5 g/dL (ref 3.6–5.1)
Alkaline phosphatase (APISO): 89 U/L (ref 37–153)
BUN: 12 mg/dL (ref 7–25)
CO2: 27 mmol/L (ref 20–32)
Calcium: 9.9 mg/dL (ref 8.6–10.4)
Chloride: 105 mmol/L (ref 98–110)
Creat: 0.79 mg/dL (ref 0.50–0.99)
GFR, Est African American: 90 mL/min/{1.73_m2} (ref >=60)
GFR, Est Non African American: 78 mL/min/{1.73_m2} (ref >=60)
Globulin: 2.3 g/dL (calc) (ref 1.9–3.7)
Glucose, Bld: 86 mg/dL (ref 65–99)
Potassium: 4.5 mmol/L (ref 3.5–5.3)
Sodium: 140 mmol/L (ref 135–146)
Total Bilirubin: 0.5 mg/dL (ref 0.2–1.2)
Total Protein: 6.8 g/dL (ref 6.1–8.1)

## 2018-05-17 LAB — LIPID PANEL
Cholesterol: 224 mg/dL — ABNORMAL HIGH (ref <200)
HDL: 56 mg/dL (ref >=50)
LDL Cholesterol (Calc): 128 mg/dL (calc) — ABNORMAL HIGH
Non-HDL Cholesterol (Calc): 168 mg/dL (calc) — ABNORMAL HIGH (ref <130)
Total CHOL/HDL Ratio: 4 (calc) (ref <5.0)
Triglycerides: 246 mg/dL — ABNORMAL HIGH (ref <150)

## 2018-05-17 LAB — TSH: TSH: 1.2 mIU/L (ref 0.40–4.50)

## 2018-05-17 LAB — VITAMIN D 25 HYDROXY (VIT D DEFICIENCY, FRACTURES): Vit D, 25-Hydroxy: 55 ng/mL (ref 30–100)

## 2018-05-17 MED ORDER — ALPRAZOLAM 1 MG PO TABS: ORAL_TABLET | ORAL | 0 refills | Status: DC

## 2018-05-24 ENCOUNTER — Other Ambulatory Visit: Payer: Self-pay | Admitting: Physician Assistant

## 2018-05-24 DIAGNOSIS — F419 Anxiety disorder, unspecified: Secondary | ICD-10-CM

## 2018-05-24 MED ORDER — VENLAFAXINE HCL ER 37.5 MG PO CP24: 37.5000 mg | ORAL_CAPSULE | Freq: Two times a day (BID) | ORAL | 1 refills | Status: DC

## 2018-06-13 ENCOUNTER — Other Ambulatory Visit: Payer: Medicare Other

## 2018-06-16 ENCOUNTER — Other Ambulatory Visit: Payer: Self-pay | Admitting: Physician Assistant

## 2018-06-16 DIAGNOSIS — F419 Anxiety disorder, unspecified: Secondary | ICD-10-CM

## 2018-06-28 ENCOUNTER — Telehealth: Payer: Self-pay | Admitting: Physician Assistant

## 2018-06-28 DIAGNOSIS — F988 Other specified behavioral and emotional disorders with onset usually occurring in childhood and adolescence: Secondary | ICD-10-CM

## 2018-06-28 MED ORDER — AMPHETAMINE-DEXTROAMPHETAMINE 20 MG PO TABS: ORAL_TABLET | ORAL | 0 refills | Status: DC

## 2018-06-28 NOTE — Telephone Encounter (Signed)
-----   Message from Gregery Na, CMA sent at 06/28/2018  4:26 PM EDT ----- Regarding: REFILL PER PT/YELLOW NOTE:  Refill on ADDERALL Please & thank you!  Pharmacy:  [facility]

## 2018-07-02 ENCOUNTER — Telehealth: Payer: Self-pay | Admitting: Physician Assistant

## 2018-07-02 MED ORDER — VENLAFAXINE HCL ER 75 MG PO CP24: 75.0000 mg | ORAL_CAPSULE | Freq: Every day | ORAL | 2 refills | Status: DC

## 2018-07-02 NOTE — Telephone Encounter (Signed)
-----   Message from Gregery Na, CMA sent at 07/02/2018 10:40 AM EDT ----- Regarding: med dose change Contact: [phone] Diana Wolfe                     Patient would like her VENLAFAXINE dose changed to Takes 1 tablet daily instead of take 1 capsule by mouth 2 times daily.   Pharmacy request: by patient.  [facility] [address]  Please & thank you

## 2018-07-04 ENCOUNTER — Other Ambulatory Visit: Payer: Self-pay | Admitting: Physician Assistant

## 2018-07-04 MED ORDER — VENLAFAXINE HCL ER 37.5 MG PO CP24: 37.5000 mg | ORAL_CAPSULE | Freq: Every day | ORAL | 1 refills | Status: DC

## 2018-07-17 ENCOUNTER — Other Ambulatory Visit: Payer: Self-pay | Admitting: Physician Assistant

## 2018-07-18 ENCOUNTER — Encounter: Payer: Self-pay | Admitting: Physician Assistant

## 2018-07-18 ENCOUNTER — Ambulatory Visit: Payer: Medicare Other | Admitting: Physician Assistant

## 2018-07-18 ENCOUNTER — Other Ambulatory Visit: Payer: Self-pay

## 2018-07-18 VITALS — BP 120/70 | HR 62 | Temp 97.1°F | Wt 164.0 lb

## 2018-07-18 DIAGNOSIS — R42 Dizziness and giddiness: Secondary | ICD-10-CM | POA: Diagnosis not present

## 2018-07-18 NOTE — Progress Notes (Signed)
THIS ENCOUNTER IS A VIRTUAL VISIT DUE TO COVID-19 - PATIENT WAS NOT SEEN IN THE OFFICE.  PATIENT HAS CONSENTED TO VIRTUAL VISIT / TELEMEDICINE VISIT   Virtual Visit via telephone Note  I connected with Kayana S Katona on 07/18/2018   by telephone.  I verified that I am speaking with the correct person using two identifiers.    I discussed the limitations of evaluation and management by telemedicine and the availability of in person appointments. The patient expressed understanding and agreed to proceed.  History of Present Illness: 68 y.o. WF call with nausea and vomiting x 1 today.  When she woke up this AM she felt dizzy like the room was spinning. Worse when she got up to go to the bathroom, had nausea all day. She does not have a history of vertigo,has history of motion sickness.   She has had dull headache one and off, not worse ever, she has history of headaches and it is unchanged. No changes in vision. No numbness tingling anywhere.  States she has a pulse ox and O2 was 97-98%, her pulse was 62 which is low for her.   She denies any associated neurological complications or symptoms, such as one-sided weakness, numbness, tingling, slurring of speech, droopy face, swallowing difficulties.  She has had allergies but no sinus infection. She denies fever.   Blood pressure 120/70, pulse 62, temperature (!) 97.1 F (36.2 C), weight 164 lb (74.4 kg), SpO2 97 %.   Medications  Current Outpatient Medications (Endocrine & Metabolic):  .  SYNTHROID 125 MCG tablet, TAKE 1 TABLET BY MOUTH EVERY DAY BEFORE BREAKFAST  Current Outpatient Medications (Cardiovascular):  Marland Kitchen  EPINEPHrine 0.15 MG/0.15ML IJ injection, Inject 0.15 mLs (0.15 mg total) into the muscle as needed for anaphylaxis.   Current Outpatient Medications (Analgesics):  .  meloxicam (MOBIC) 15 MG tablet, Take 1/2 to 1 tablet Daily with food for Pain & Inflammation (Patient taking differently: as needed. Take 1/2 to 1 tablet  Daily with food for Pain & Inflammation)   Current Outpatient Medications (Other):  Marland Kitchen  ALPRAZolam (XANAX) 1 MG tablet, 1/2-1 tablet as needed for sleep/anxiety .  amphetamine-dextroamphetamine (ADDERALL) 20 MG tablet, Take 1/2 to 1 tablet daily if needed for ADD and please try to limit to 5 days /week to avoid addiction Rx to last 6 weeks .  conjugated estrogens (PREMARIN) vaginal cream, Place 1 Applicatorful vaginally daily. Marland Kitchen  ketoconazole (NIZORAL) 2 % cream, APPLY TO AFFECTED AREA(S) TWO TIMES A DAY (Patient taking differently: as needed. ) .  lidocaine (LIDODERM) 5 %, Place 1 patch onto the skin daily. Remove & Discard patch within 12 hours or as directed by MD .  meclizine (ANTIVERT) 25 MG tablet, 1/2-1 pill up to 3 times daily for motion sickness/dizziness (Patient taking differently: as needed. 1/2-1 pill up to 3 times daily for motion sickness/dizziness) .  omeprazole (PRILOSEC) 40 MG capsule, Take 1 capsule (40 mg total) by mouth daily. Marland Kitchen  triamcinolone ointment (KENALOG) 0.1 %, Apply 1 application topically 2 (two) times daily. (Patient taking differently: Apply 1 application topically as needed. ) .  venlafaxine XR (EFFEXOR XR) 37.5 MG 24 hr capsule, Take 1 capsule (37.5 mg total) by mouth daily. .  vitamin C (ASCORBIC ACID) 500 MG tablet, Take 1,200 mg by mouth daily. .  Vitamin D, Ergocalciferol, (DRISDOL) 1.25 MG (50000 UT) CAPS capsule, TAKE 1 CAPSULE BY MOUTH 3 DAYS A WEEK.  Problem list She has Hypothyroidism; Iron deficiency anemia; Obstructive sleep  apnea; Allergic rhinitis; Headache; PULMONARY EMBOLISM, HX OF; Hyperlipidemia; Hypertension; Anxiety; GERD (gastroesophageal reflux disease); ADD (attention deficit disorder); Depression; Vitamin D deficiency; Abnormal glucose; Vaginal atrophy; and Medication management on their problem list.   Observations/Objective: General Appearance:Well sounding, in no apparent distress.  ENT/Mouth: No hoarseness, No cough for duration of  visit.  Respiratory: completing full sentences without distress, without audible wheeze Neuro: Awake and oriented X 3,  Psych:  Insight and Judgment appropriate.   Assessment and Plan: Vertigo Get on allergy pill and nasal spray Take the meclizine She was informed to call 911 if she develop any new symptoms such as worsening headaches, episodes of blurred vision, double vision or complete loss of vision or speech difficulties or motor weakness. Give us a call tomorrow if not better will call to get appointment to get labs and evaluated  Follow Up Instructions:  I discussed the assessment and treatment plan with the patient. The patient was provided an opportunity to ask questions and all were answered. The patient agreed with the plan and demonstrated an understanding of the instructions.   The patient was advised to call back or seek an in-person evaluation if the symptoms worsen or if the condition fails to improve as anticipated.  I provided 20 minutes of non-face-to-face time during this encounter.   Quentin MullingAmanda Renelle Stegenga, PA-C

## 2018-07-19 ENCOUNTER — Other Ambulatory Visit: Payer: Self-pay

## 2018-07-19 ENCOUNTER — Other Ambulatory Visit: Payer: Medicare Other

## 2018-07-19 ENCOUNTER — Other Ambulatory Visit: Payer: Self-pay | Admitting: Physician Assistant

## 2018-07-19 DIAGNOSIS — R8290 Unspecified abnormal findings in urine: Secondary | ICD-10-CM | POA: Diagnosis not present

## 2018-07-19 DIAGNOSIS — Z20828 Contact with and (suspected) exposure to other viral communicable diseases: Secondary | ICD-10-CM

## 2018-07-19 DIAGNOSIS — E611 Iron deficiency: Secondary | ICD-10-CM

## 2018-07-19 DIAGNOSIS — E538 Deficiency of other specified B group vitamins: Secondary | ICD-10-CM | POA: Diagnosis not present

## 2018-07-19 DIAGNOSIS — Z20822 Contact with and (suspected) exposure to covid-19: Secondary | ICD-10-CM

## 2018-07-19 DIAGNOSIS — Z79899 Other long term (current) drug therapy: Secondary | ICD-10-CM

## 2018-07-19 DIAGNOSIS — E039 Hypothyroidism, unspecified: Secondary | ICD-10-CM | POA: Diagnosis not present

## 2018-07-19 DIAGNOSIS — R42 Dizziness and giddiness: Secondary | ICD-10-CM

## 2018-07-19 LAB — SAR COV2 SEROLOGY (COVID19)AB(IGG),IA: SARS CoV2 AB IGG: NEGATIVE

## 2018-07-20 LAB — CBC WITH DIFFERENTIAL/PLATELET
Absolute Monocytes: 680 cells/uL (ref 200–950)
Basophils Absolute: 50 cells/uL (ref 0–200)
Basophils Relative: 0.6 %
Eosinophils Absolute: 143 cells/uL (ref 15–500)
Eosinophils Relative: 1.7 %
HCT: 45.9 % — ABNORMAL HIGH (ref 35.0–45.0)
Hemoglobin: 15.3 g/dL (ref 11.7–15.5)
Lymphs Abs: 2352 cells/uL (ref 850–3900)
MCH: 28.7 pg (ref 27.0–33.0)
MCHC: 33.3 g/dL (ref 32.0–36.0)
MCV: 86.1 fL (ref 80.0–100.0)
MPV: 10.2 fL (ref 7.5–12.5)
Monocytes Relative: 8.1 %
Neutro Abs: 5174 cells/uL (ref 1500–7800)
Neutrophils Relative %: 61.6 %
Platelets: 364 10*3/uL (ref 140–400)
RBC: 5.33 10*6/uL — ABNORMAL HIGH (ref 3.80–5.10)
RDW: 13.1 % (ref 11.0–15.0)
Total Lymphocyte: 28 %
WBC: 8.4 10*3/uL (ref 3.8–10.8)

## 2018-07-20 LAB — COMPLETE METABOLIC PANEL WITH GFR
AG Ratio: 1.8 (calc) (ref 1.0–2.5)
ALT: 19 U/L (ref 6–29)
AST: 20 U/L (ref 10–35)
Albumin: 4.2 g/dL (ref 3.6–5.1)
Alkaline phosphatase (APISO): 91 U/L (ref 37–153)
BUN: 14 mg/dL (ref 7–25)
CO2: 24 mmol/L (ref 20–32)
Calcium: 9.9 mg/dL (ref 8.6–10.4)
Chloride: 102 mmol/L (ref 98–110)
Creat: 0.96 mg/dL (ref 0.50–0.99)
GFR, Est African American: 71 mL/min/{1.73_m2} (ref >=60)
GFR, Est Non African American: 62 mL/min/{1.73_m2} (ref >=60)
Globulin: 2.4 g/dL (calc) (ref 1.9–3.7)
Glucose, Bld: 109 mg/dL — ABNORMAL HIGH (ref 65–99)
Potassium: 4.2 mmol/L (ref 3.5–5.3)
Sodium: 139 mmol/L (ref 135–146)
Total Bilirubin: 0.3 mg/dL (ref 0.2–1.2)
Total Protein: 6.6 g/dL (ref 6.1–8.1)

## 2018-07-20 LAB — URINALYSIS, ROUTINE W REFLEX MICROSCOPIC
Bacteria, UA: NONE SEEN /HPF
Bilirubin Urine: NEGATIVE
Glucose, UA: NEGATIVE
Hgb urine dipstick: NEGATIVE
Hyaline Cast: NONE SEEN /LPF
Ketones, ur: NEGATIVE
Nitrite: NEGATIVE
Protein, ur: NEGATIVE
RBC / HPF: NONE SEEN /HPF (ref 0–2)
Specific Gravity, Urine: 1.01 (ref 1.001–1.03)
Squamous Epithelial / HPF: NONE SEEN /HPF (ref <=5)
pH: 5.5 (ref 5.0–8.0)

## 2018-07-20 LAB — IRON, TOTAL/TOTAL IRON BINDING CAP
%SAT: 14 % (calc) — ABNORMAL LOW (ref 16–45)
Iron: 50 ug/dL (ref 45–160)
TIBC: 361 mcg/dL (calc) (ref 250–450)

## 2018-07-20 LAB — TSH: TSH: 1.81 mIU/L (ref 0.40–4.50)

## 2018-07-20 LAB — URINE CULTURE
MICRO NUMBER:: 514760
SPECIMEN QUALITY:: ADEQUATE

## 2018-07-20 LAB — VITAMIN B12: Vitamin B-12: 210 pg/mL (ref 200–1100)

## 2018-07-20 LAB — MAGNESIUM: Magnesium: 2.2 mg/dL (ref 1.5–2.5)

## 2018-07-20 LAB — FERRITIN: Ferritin: 26 ng/mL (ref 16–288)

## 2018-07-23 MED ORDER — CYANOCOBALAMIN 1000 MCG/ML IJ SOLN: 1000.0000 ug | INTRAMUSCULAR | 0 refills | Status: DC

## 2018-07-24 ENCOUNTER — Ambulatory Visit: Payer: Medicare Other | Admitting: Physician Assistant

## 2018-07-29 ENCOUNTER — Other Ambulatory Visit: Payer: Self-pay | Admitting: Internal Medicine

## 2018-07-29 ENCOUNTER — Other Ambulatory Visit: Payer: Self-pay | Admitting: Adult Health

## 2018-07-29 DIAGNOSIS — M542 Cervicalgia: Secondary | ICD-10-CM

## 2018-08-01 NOTE — Progress Notes (Signed)
Subjective:    Patient ID: Diana Wolfe, female    DOB: 07/08/1950, 68 y.o.   MRN: [id number]  HPI 68 y.o. WF presents with mass on her AB. She is on B12 shots now, had June 2nd, will do once a week for 6 weeks and then go to once a month.  Lab Results  Component Value Date   VITAMINB12 210 07/19/2018   She is here today because she states she has an epigastric mass/nodule x march. States she feels she has a difficult time getting an abdominal breath sometimes. Feels more when she is driving a lot and worse when the dogs have pulled on her a lot. Feels like it may be a hernia. When she is holding her dogs in her right hand and they pull on her, she feels lit pulling on her.   BMI is Body mass index is 30 kg/m., she is working on diet and exercise. Wt Readings from Last 3 Encounters:  08/02/18 164 lb (74.4 kg)  07/18/18 164 lb (74.4 kg)  05/16/18 163 lb 3.2 oz (74 kg)    Blood pressure 130/80, pulse 86, temperature 97.6 F (36.4 C), weight 164 lb (74.4 kg), SpO2 96 %.  09/2017 EXAM: CT ABDOMEN AND PELVIS WITH CONTRAST  TECHNIQUE: Multidetector CT imaging of the abdomen and pelvis was performed using the standard protocol following bolus administration of intravenous contrast.  CONTRAST:  100mL ISOVUE-300 IOPAMIDOL (ISOVUE-300) INJECTION 61%  COMPARISON:  None.  FINDINGS: Lower Chest: No acute findings.  Hepatobiliary: No hepatic masses identified. Gallbladder is unremarkable.  Pancreas:  No mass or inflammatory changes.  Spleen: Within normal limits in size and appearance.  Adrenals/Urinary Tract: No masses identified. No evidence of hydronephrosis.  Stomach/Bowel: A short segment enteroenteric intussusception is seen in the left abdomen, without evidence of abnormal wall thickening. No evidence of bowel obstruction, inflammatory process or abnormal fluid collections. Normal appendix visualized.  Vascular/Lymphatic: No pathologically enlarged  lymph nodes. No abdominal aortic aneurysm. Aortic atherosclerosis.  Reproductive:  No mass or other significant abnormality.  Other:  None.  Musculoskeletal:  No suspicious bone lesions identified.  IMPRESSION: Short segment enteroenteric intussusception in the left abdomen, likely a transient finding. Consider follow-up with CT enterography in 1 month or capsule endoscopy.  Medications Current Outpatient Medications on File Prior to Visit  Medication Sig  . ALPRAZolam (XANAX) 1 MG tablet 1/2-1 tablet as needed for sleep/anxiety  . amphetamine-dextroamphetamine (ADDERALL) 20 MG tablet Take 1/2 to 1 tablet daily if needed for ADD and please try to limit to 5 days /week to avoid addiction Rx to last 6 weeks  . conjugated estrogens (PREMARIN) vaginal cream Place 1 Applicatorful vaginally daily.  . cyanocobalamin (,VITAMIN B-12,) 1000 MCG/ML injection Inject 1 mL (1,000 mcg total) into the skin every 30 (thirty) days.  Marland Kitchen. EPINEPHrine 0.15 MG/0.15ML IJ injection Inject 0.15 mLs (0.15 mg total) into the muscle as needed for anaphylaxis.  Marland Kitchen. ketoconazole (NIZORAL) 2 % cream APPLY TO AFFECTED AREA(S) TWO TIMES A DAY (Patient taking differently: as needed. )  . lidocaine (LIDODERM) 5 % Place 1 patch onto the skin daily. Remove & Discard patch within 12 hours or as directed by MD  . meclizine (ANTIVERT) 25 MG tablet 1/2-1 pill up to 3 times daily for motion sickness/dizziness (Patient taking differently: as needed. 1/2-1 pill up to 3 times daily for motion sickness/dizziness)  . meloxicam (MOBIC) 15 MG tablet TAKE 1/2 TO 1 TABLET DAILY WITH FOOD FOR PAIN & INFLAMMATION  .  omeprazole (PRILOSEC) 40 MG capsule Take 1 capsule (40 mg total) by mouth daily.  Marland Kitchen SYNTHROID 125 MCG tablet Take 1 tablet daily on an empty stomach with only water for 30 minutes & no Antacid meds, Calcium or Magnesium for 4 hours & avoid Biotin  . triamcinolone ointment (KENALOG) 0.1 % Apply 1 application topically 2 (two)  times daily. (Patient taking differently: Apply 1 application topically as needed. )  . venlafaxine XR (EFFEXOR XR) 37.5 MG 24 hr capsule Take 1 capsule (37.5 mg total) by mouth daily.  . vitamin C (ASCORBIC ACID) 500 MG tablet Take 1,200 mg by mouth daily.  . Vitamin D, Ergocalciferol, (DRISDOL) 1.25 MG (50000 UT) CAPS capsule TAKE 1 CAPSULE BY MOUTH 3 DAYS A WEEK.   No current facility-administered medications on file prior to visit.     Problem list She has Hypothyroidism; Iron deficiency anemia; Obstructive sleep apnea; Allergic rhinitis; Headache; PULMONARY EMBOLISM, HX OF; Hyperlipidemia; Hypertension; Anxiety; GERD (gastroesophageal reflux disease); ADD (attention deficit disorder); Depression; Vitamin D deficiency; Abnormal glucose; Vaginal atrophy; and Medication management on their problem list.  Review of Systems     Objective:   Physical Exam Constitutional:      Appearance: Normal appearance.  HENT:     Head: Normocephalic and atraumatic.     Nose: Nose normal.     Mouth/Throat:     Mouth: Mucous membranes are moist.     Pharynx: Oropharynx is clear.  Eyes:     Pupils: Pupils are equal, round, and reactive to light.  Cardiovascular:     Rate and Rhythm: Normal rate and regular rhythm.  Pulmonary:     Effort: Pulmonary effort is normal.     Breath sounds: Normal breath sounds.  Chest:     Chest wall: Tenderness (at xyphoid process with mild swelling) present. No mass.  Abdominal:     General: Bowel sounds are normal.     Palpations: Abdomen is soft. There is no mass.     Tenderness: There is no guarding or rebound.     Hernia: No hernia is present.  Lymphadenopathy:     Upper Body:     Right upper body: No supraclavicular, axillary or pectoral adenopathy.     Left upper body: No supraclavicular, axillary or pectoral adenopathy.  Neurological:     Mental Status: She is alert.           Assessment & Plan:   Xyphoidalgia -     DG Sternum; Future - NO  MASS, JUST TENDER XYPHOID PROCESS - will get xray, rule out lesion - heat/ice, NSAID, rest - patient has had aches/pain- if continues get connective tissue labs  Gastroesophageal reflux disease without esophagitis -     omeprazole (PRILOSEC) 40 MG capsule; Take 1 capsule (40 mg total) by mouth daily.  Rash -     triamcinolone cream (KENALOG) 0.5 %; Apply 1 application topically 2 (two) times daily. -     ketoconazole (NIZORAL) 2 % cream; APPLY TO AFFECTED AREA(S) TWO TIMES A DAY -     omeprazole (PRILOSEC) 40 MG capsule; Take 1 capsule (40 mg total) by mouth daily. -     DG Sternum; Future

## 2018-08-02 ENCOUNTER — Ambulatory Visit
Admission: RE | Admit: 2018-08-02 | Discharge: 2018-08-02 | Disposition: A | Discharge status: Home / Self Care | Payer: Medicare Other | Source: Ambulatory Visit | Attending: Physician Assistant | Admitting: Physician Assistant

## 2018-08-02 ENCOUNTER — Ambulatory Visit (INDEPENDENT_AMBULATORY_CARE_PROVIDER_SITE_OTHER): Payer: Medicare Other | Admitting: Physician Assistant

## 2018-08-02 ENCOUNTER — Other Ambulatory Visit: Payer: Self-pay

## 2018-08-02 ENCOUNTER — Encounter: Payer: Self-pay | Admitting: Physician Assistant

## 2018-08-02 VITALS — BP 130/80 | HR 86 | Temp 97.6°F | Wt 164.0 lb

## 2018-08-02 DIAGNOSIS — E538 Deficiency of other specified B group vitamins: Secondary | ICD-10-CM

## 2018-08-02 DIAGNOSIS — K219 Gastro-esophageal reflux disease without esophagitis: Secondary | ICD-10-CM

## 2018-08-02 DIAGNOSIS — J309 Allergic rhinitis, unspecified: Secondary | ICD-10-CM | POA: Diagnosis not present

## 2018-08-02 DIAGNOSIS — R0789 Other chest pain: Secondary | ICD-10-CM

## 2018-08-02 DIAGNOSIS — R21 Rash and other nonspecific skin eruption: Secondary | ICD-10-CM

## 2018-08-02 DIAGNOSIS — R222 Localized swelling, mass and lump, trunk: Secondary | ICD-10-CM | POA: Diagnosis not present

## 2018-08-02 MED ORDER — CYANOCOBALAMIN 1000 MCG/ML IJ SOLN
1000.0000 ug | Freq: Once | INTRAMUSCULAR | Status: AC
  Administered 2018-08-02: 1000 ug via INTRAMUSCULAR

## 2018-08-02 MED ORDER — OMEPRAZOLE 40 MG PO CPDR: 40.0000 mg | DELAYED_RELEASE_CAPSULE | Freq: Every day | ORAL | 1 refills | Status: DC

## 2018-08-02 MED ORDER — KETOCONAZOLE 2 % EX CREA: TOPICAL_CREAM | CUTANEOUS | 3 refills | Status: DC

## 2018-08-02 MED ORDER — TRIAMCINOLONE ACETONIDE 0.5 % EX CREA: 1.0000 "application " | TOPICAL_CREAM | Freq: Two times a day (BID) | CUTANEOUS | 2 refills | Status: DC

## 2018-08-02 NOTE — Patient Instructions (Addendum)
Xiphoidalgia (xiphoidynia) is another relatively rare syndrome that is characterized by localized discomfort and tenderness over the xiphoid process of the sternum [27]. Symptoms are often aggravated by eating a heavy meal or bending or twisting movements; they may also be associated with resumption of heavy work or a recent cough, suggesting a traumatic cause in some patients.  Try NSIADS and tylenol Rest Can do ice/heat which ever feels better   INFORMATION ABOUT YOUR XRAY  Can walk into [address] for an Insurance account manager. They will have the order and take you back. You do not any paper work, I should get the result back today or tomorrow. This order is good for a year.  Can call [phone] to schedule an appointment if you wish.   If not better or anything else will get rheumatoid labs or connective tissue labs  Do B12 shots once a week for 4 more weeks and then can do once a month.

## 2018-08-09 ENCOUNTER — Ambulatory Visit (INDEPENDENT_AMBULATORY_CARE_PROVIDER_SITE_OTHER): Payer: Medicare Other

## 2018-08-09 ENCOUNTER — Other Ambulatory Visit: Payer: Self-pay

## 2018-08-09 VITALS — BP 140/80 | HR 88 | Temp 97.3°F | Wt 166.2 lb

## 2018-08-09 DIAGNOSIS — E538 Deficiency of other specified B group vitamins: Secondary | ICD-10-CM

## 2018-08-09 NOTE — Progress Notes (Signed)
Patient presents to the office for a nurse visit to provide instructions to administer B12 injections. Patient drew up and properly administered medication successfully without any complications. Vitals taken and recorded.

## 2018-08-21 ENCOUNTER — Other Ambulatory Visit: Payer: Self-pay

## 2018-08-21 ENCOUNTER — Ambulatory Visit
Admission: RE | Admit: 2018-08-21 | Discharge: 2018-08-21 | Disposition: A | Discharge status: Home / Self Care | Payer: Medicare Other | Source: Ambulatory Visit | Attending: Physician Assistant | Admitting: Physician Assistant

## 2018-08-21 DIAGNOSIS — E348 Other specified endocrine disorders: Secondary | ICD-10-CM

## 2018-08-21 DIAGNOSIS — Z780 Asymptomatic menopausal state: Secondary | ICD-10-CM | POA: Diagnosis not present

## 2018-08-21 DIAGNOSIS — M85851 Other specified disorders of bone density and structure, right thigh: Secondary | ICD-10-CM | POA: Diagnosis not present

## 2018-08-23 MED ORDER — CYANOCOBALAMIN 1000 MCG/ML IJ SOLN: INTRAMUSCULAR | 0 refills | Status: DC

## 2018-08-23 MED ORDER — "BD SYRINGE/NEEDLE 25G X 5/8"" 1 ML MISC": 1 refills | Status: DC

## 2018-09-20 ENCOUNTER — Telehealth: Payer: Self-pay | Admitting: Physician Assistant

## 2018-09-20 DIAGNOSIS — F988 Other specified behavioral and emotional disorders with onset usually occurring in childhood and adolescence: Secondary | ICD-10-CM

## 2018-09-20 MED ORDER — AMPHETAMINE-DEXTROAMPHETAMINE 20 MG PO TABS: ORAL_TABLET | ORAL | 0 refills | Status: DC

## 2018-09-20 NOTE — Telephone Encounter (Signed)
-----   Message from Elenor Quinones, [place] sent at 09/20/2018 10:00 AM EDT ----- Regarding: REFILL Contact: [phone] PER PT/YELLOW NOTE:  Refill on ADDERALL Please & thank you!  Pharmacy:  [facility]

## 2018-10-05 DIAGNOSIS — E538 Deficiency of other specified B group vitamins: Secondary | ICD-10-CM

## 2018-10-10 ENCOUNTER — Ambulatory Visit: Payer: Medicare Other

## 2018-10-11 ENCOUNTER — Ambulatory Visit (INDEPENDENT_AMBULATORY_CARE_PROVIDER_SITE_OTHER): Payer: Medicare Other

## 2018-10-11 ENCOUNTER — Ambulatory Visit: Payer: Medicare Other | Admitting: Internal Medicine

## 2018-10-11 ENCOUNTER — Other Ambulatory Visit: Payer: Self-pay

## 2018-10-11 VITALS — HR 81 | Temp 97.3°F | Wt 162.0 lb

## 2018-10-11 DIAGNOSIS — Z23 Encounter for immunization: Secondary | ICD-10-CM

## 2018-10-11 DIAGNOSIS — E538 Deficiency of other specified B group vitamins: Secondary | ICD-10-CM

## 2018-10-12 LAB — VITAMIN B12: Vitamin B-12: 945 pg/mL (ref 200–1100)

## 2018-10-28 DIAGNOSIS — Z23 Encounter for immunization: Secondary | ICD-10-CM | POA: Diagnosis not present

## 2018-11-16 ENCOUNTER — Ambulatory Visit: Payer: Medicare Other | Admitting: Physician Assistant

## 2018-11-28 ENCOUNTER — Telehealth: Payer: Self-pay | Admitting: Physician Assistant

## 2018-11-28 DIAGNOSIS — F988 Other specified behavioral and emotional disorders with onset usually occurring in childhood and adolescence: Secondary | ICD-10-CM

## 2018-11-28 MED ORDER — AMPHETAMINE-DEXTROAMPHETAMINE 20 MG PO TABS: ORAL_TABLET | ORAL | 0 refills | Status: DC

## 2018-11-28 NOTE — Telephone Encounter (Signed)
-----   Message from Elenor Quinones, [place] sent at 11/27/2018  3:12 PM EDT ----- Regarding: med refill Contact: [phone] PER PT/YELLOW NOTE:  Refill on ADDERALL Please & thank you!  Ph[facility]:  [facility] [address]

## 2018-12-06 ENCOUNTER — Encounter: Payer: Self-pay | Admitting: Physician Assistant

## 2019-01-01 ENCOUNTER — Encounter: Payer: Self-pay | Admitting: Physician Assistant

## 2019-01-09 ENCOUNTER — Encounter: Payer: Self-pay | Admitting: Physician Assistant

## 2019-01-31 ENCOUNTER — Other Ambulatory Visit: Payer: Self-pay

## 2019-01-31 ENCOUNTER — Ambulatory Visit (INDEPENDENT_AMBULATORY_CARE_PROVIDER_SITE_OTHER): Payer: Medicare Other | Admitting: Physician Assistant

## 2019-01-31 ENCOUNTER — Encounter: Payer: Self-pay | Admitting: Physician Assistant

## 2019-01-31 VITALS — BP 120/64 | HR 91 | Temp 97.5°F | Ht 62.0 in | Wt 164.4 lb

## 2019-01-31 DIAGNOSIS — F988 Other specified behavioral and emotional disorders with onset usually occurring in childhood and adolescence: Secondary | ICD-10-CM

## 2019-01-31 DIAGNOSIS — H6122 Impacted cerumen, left ear: Secondary | ICD-10-CM

## 2019-01-31 DIAGNOSIS — M545 Low back pain, unspecified: Secondary | ICD-10-CM

## 2019-01-31 DIAGNOSIS — H9202 Otalgia, left ear: Secondary | ICD-10-CM

## 2019-01-31 DIAGNOSIS — I10 Essential (primary) hypertension: Secondary | ICD-10-CM

## 2019-01-31 DIAGNOSIS — E785 Hyperlipidemia, unspecified: Secondary | ICD-10-CM | POA: Diagnosis not present

## 2019-01-31 DIAGNOSIS — Z79899 Other long term (current) drug therapy: Secondary | ICD-10-CM

## 2019-01-31 DIAGNOSIS — Z136 Encounter for screening for cardiovascular disorders: Secondary | ICD-10-CM | POA: Diagnosis not present

## 2019-01-31 DIAGNOSIS — R7309 Other abnormal glucose: Secondary | ICD-10-CM

## 2019-01-31 DIAGNOSIS — Z0001 Encounter for general adult medical examination with abnormal findings: Secondary | ICD-10-CM

## 2019-01-31 DIAGNOSIS — G4733 Obstructive sleep apnea (adult) (pediatric): Secondary | ICD-10-CM

## 2019-01-31 DIAGNOSIS — F3342 Major depressive disorder, recurrent, in full remission: Secondary | ICD-10-CM | POA: Diagnosis not present

## 2019-01-31 DIAGNOSIS — Z86718 Personal history of other venous thrombosis and embolism: Secondary | ICD-10-CM

## 2019-01-31 DIAGNOSIS — K219 Gastro-esophageal reflux disease without esophagitis: Secondary | ICD-10-CM

## 2019-01-31 DIAGNOSIS — E559 Vitamin D deficiency, unspecified: Secondary | ICD-10-CM

## 2019-01-31 DIAGNOSIS — E538 Deficiency of other specified B group vitamins: Secondary | ICD-10-CM

## 2019-01-31 DIAGNOSIS — D509 Iron deficiency anemia, unspecified: Secondary | ICD-10-CM

## 2019-01-31 DIAGNOSIS — E039 Hypothyroidism, unspecified: Secondary | ICD-10-CM

## 2019-01-31 DIAGNOSIS — G8929 Other chronic pain: Secondary | ICD-10-CM

## 2019-01-31 NOTE — Progress Notes (Signed)
CPE AND FOLLOW UP  Assessment:   Cerumen impaction left ear - stop using Qtips, irrigation used in the office without complications after unable to successfully manually remove cerumen, use OTC drops/oil at home to prevent reoccurence  Chronic right-sided low back pain without sciatica -     Ambulatory referral to Orthopedics - had MRI 10 years ago that showed DDD, neg straight leg raise and no radicular symptoms, will refer to ortho for evaluation and possible injections.   Encounter for general adult medical examination with abnormal findings 1 year  Abnormal glucose -     Hemoglobin A1c ([facility])  Obstructive sleep apnea Given numbers for dentist that do mouth piece  Iron deficiency anemia, unspecified iron deficiency anemia type -     Iron,Total/Total Iron Binding Cap -     Ferritin  Attention deficit disorder, unspecified hyperactivity presence Continue medication ? Would benefit from new mouth piece for OSA  Vitamin D deficiency -     Vitamin D (25 hydroxy)  B12 deficiency -     Vitamin B12  BMI 29.0-29.9,adult  Overweight  -  discussion about weight loss, diet, and exercise -recommended diet heavy in fruits and veggies and low in animal meats, cheeses, and dairy products  Essential hypertension - continue medications, DASH diet, exercise and monitor at home. Call if greater than 130/80.  -     CBC with Differential/Platelet -     BASIC METABOLIC PANEL WITH GFR -     Hepatic function panel -     TSH  Hypothyroidism, unspecified type Hypothyroidism-check TSH level, continue medications the same, reminded to take on an empty stomach 30-81mins before food.  -     TSH  PULMONARY EMBOLISM, HX OF Monitor, was provoked after breaking leg  Hyperlipidemia, unspecified hyperlipidemia type -continue medications, check lipids, decrease fatty foods, increase activity.   Recurrent major depressive disorder, in full remission (HCC) -  stress management techniques  discussed, increase water, good sleep hygiene discussed, increase exercise, and increase veggies.  Not on any medications at this time.   Medication management -     Magnesium  Over 40 minutes of exam, counseling, chart review and critical decision making was performed Future Appointments  Date Time Provider Department Center  05/17/2019 10:30 AM Quentin Mulling, PA-C [facility] None  02/04/2020 10:00 AM Quentin Mulling, PA-C [facility] None    Subjective:  Diana Wolfe is a 68 y.o. right handed female female who presents for CPE and 3 month follow up.   She has been having left ear soreness/discomfort for 3-4 months, no decreased hearing. No tinnitus. She has been using peroxide x 2-3 weeks with some help.   She states she has lipoma left lower back, she has left lower back pain, will have a catch with bending over, will come and go, had a massage that helped some. Patient denies fever, hematuria, incontinence, numbness, tingling, weakness and saddle anesthesia   Her blood pressure has been controlled at home, today their BP is BP: 120/64 She does not workout, she is active with her dog. She denies chest pain, shortness of breath, dizziness.  She is not on cholesterol medication and denies myalgias. Her cholesterol is at goal. The cholesterol last visit was:   Lab Results  Component Value Date   CHOL 224 (H) 05/16/2018   HDL 56 05/16/2018   LDLCALC 128 (H) 05/16/2018   LDLDIRECT 138.2 04/25/2008   TRIG 246 (H) 05/16/2018   CHOLHDL 4.0 05/16/2018   She is  on the adderall, 1/2 tablet 1-2 x a day mainly when she drives. She has urinary frequency during the day as well, declines incontinence, does have urgency. She does have vaginal atrophy and does premarin 2 x a week.  She has chronic joint pain bilateral feet and left knee, on meloxicam 2 days a week. Takes prilosec as needed for GERD.    Lab Results  Component Value Date   GFRNONAA 62 07/19/2018    Last A1C in the office  was:  Lab Results  Component Value Date   HGBA1C 5.5 06/23/2015   Last GFR: Lab Results  Component Value Date   GFRNONAA 62 07/19/2018   She is on thyroid medication. Her medication was not changed last visit.   Lab Results  Component Value Date   TSH 1.81 07/19/2018  .  Patient is on Vitamin D supplement.   Lab Results  Component Value Date   VD25OH 55 05/16/2018     BMI is Body mass index is 30.07 kg/m., she is working on diet and exercise. Wt Readings from Last 3 Encounters:  01/31/19 164 lb 6.4 oz (74.6 kg)  10/11/18 162 lb (73.5 kg)  08/09/18 166 lb 3.2 oz (75.4 kg)    Medication Review:  Current Outpatient Medications (Endocrine & Metabolic):  .  SYNTHROID 125 MCG tablet, Take 1 tablet daily on an empty stomach with only water for 30 minutes & no Antacid meds, Calcium or Magnesium for 4 hours & avoid Biotin  Current Outpatient Medications (Cardiovascular):  Marland Kitchen  EPINEPHrine 0.15 MG/0.15ML IJ injection, Inject 0.15 mLs (0.15 mg total) into the muscle as needed for anaphylaxis.   Current Outpatient Medications (Analgesics):  .  meloxicam (MOBIC) 15 MG tablet, TAKE 1/2 TO 1 TABLET DAILY WITH FOOD FOR PAIN & INFLAMMATION  Current Outpatient Medications (Hematological):  .  cyanocobalamin (,VITAMIN B-12,) 1000 MCG/ML injection, Inject 106mcg once weekly for 6-8 weeks, then once a month  Current Outpatient Medications (Other):  Marland Kitchen  ALPRAZolam (XANAX) 1 MG tablet, 1/2-1 tablet as needed for sleep/anxiety .  conjugated estrogens (PREMARIN) vaginal cream, Place 1 Applicatorful vaginally daily. Marland Kitchen  ketoconazole (NIZORAL) 2 % cream, APPLY TO AFFECTED AREA(S) TWO TIMES A DAY .  lidocaine (LIDODERM) 5 %, Place 1 patch onto the skin daily. Remove & Discard patch within 12 hours or as directed by MD .  meclizine (ANTIVERT) 25 MG tablet, 1/2-1 pill up to 3 times daily for motion sickness/dizziness (Patient taking differently: as needed. 1/2-1 pill up to 3 times daily for motion  sickness/dizziness) .  omeprazole (PRILOSEC) 40 MG capsule, Take 1 capsule (40 mg total) by mouth daily. .  SYRINGE/NEEDLE, DISP, 1 ML (B-D SYRINGE/NEEDLE 1CC/25GX5/8) 25G X 5/8" 1 ML MISC, 1 SQ injection once weekly .  triamcinolone cream (KENALOG) 0.5 %, Apply 1 application topically 2 (two) times daily. .  vitamin C (ASCORBIC ACID) 500 MG tablet, Take 1,200 mg by mouth daily. .  Vitamin D, Ergocalciferol, (DRISDOL) 1.25 MG (50000 UT) CAPS capsule, TAKE 1 CAPSULE BY MOUTH 3 DAYS A WEEK. Marland Kitchen  amphetamine-dextroamphetamine (ADDERALL) 20 MG tablet, Take 1/2 to 1 tablet daily if needed for ADD and please try to limit to 5 days /week to avoid addiction Rx to last 6 weeks   Allergies  Allergen Reactions  . Levofloxacin     Neck sweling/difficult breathing  . Cefprozil   . Codeine     REACTION: vomiting  . Hydrocodone-Acetaminophen   . Morphine   . Pravastatin Swelling  .  Lexapro [Escitalopram Oxalate] Other (See Comments)    Tapping/abnormal movements    Current Problems (verified) Patient Active Problem List   Diagnosis Date Noted  . Medication management 06/23/2014  . Abnormal glucose 03/27/2014  . Vaginal atrophy 03/27/2014  . Hyperlipidemia   . Hypertension   . Anxiety   . GERD (gastroesophageal reflux disease)   . ADD (attention deficit disorder)   . Depression   . Vitamin D deficiency   . Hypothyroidism 04/25/2008  . Iron deficiency anemia 04/25/2008  . Obstructive sleep apnea 11/16/2007  . Allergic rhinitis 11/16/2007  . Headache 11/16/2007  . PULMONARY EMBOLISM, HX OF 11/16/2007    Screening Tests Immunization History  Administered Date(s) Administered  . Fluad Quad(high Dose 65+) 10/28/2018  . Influenza Whole 11/22/2007  . Influenza-Unspecified 10/28/2016  . Pneumococcal Conjugate-13 05/02/2017  . Pneumococcal Polysaccharide-23 10/11/2018  . Td 04/25/2008, 05/16/2018  . Varicella Zoster Immune Globulin 12/31/2014   Preventative care: Last colonoscopy: 2015  Dr.Magod due 2020 EGD 2017 Last mammogram: 02/2018 Last pap smear/pelvic exam: 2014 Dr. Seymour BarsLavoie DEXA: 2007 US soft tissue head 2018 MRI lumbar 2010  Prior vaccinations: TD or Tdap: 2020  Influenza: 2020 Pneumococcal: 2020 Prevnar13: 2019 Shingles/Zostavax: 2016  Names of Other Physician/Practitioners you currently use: 1. [facility] here for primary care 2. [facility], eye doctor, last visit May 2019 3. Dr. Andrey CampanileWilson, dentist, last visit yearly Patient Care Team: Lucky CowboyMcKeown, William, MD as PCP - General (Internal Medicine)  SURGICAL HISTORY She  has a past surgical history that includes Foot surgery (Right); Cesarean section; Tonsillectomy (2002); and Esophagogastroduodenoscopy (N/A, 09/02/2015). FAMILY HISTORY Her family history includes Cancer (age of onset: 3570) in her mother; Heart disease in her father; Hypertension in her father. SOCIAL HISTORY She  reports that she has never smoked. She has never used smokeless tobacco. She reports current alcohol use. She reports that she does not use drugs.   Review of Systems  Constitutional: Negative.   HENT: Positive for ear pain (left ear).   Eyes: Negative.   Respiratory: Negative.   Cardiovascular: Negative.   Gastrointestinal: Negative.   Genitourinary: Negative.   Musculoskeletal: Positive for back pain.  Skin: Negative.     Objective:     Today's Vitals   01/31/19 1016  BP: 120/64  Pulse: 91  Temp: (!) 97.5 F (36.4 C)  SpO2: 94%  Weight: 164 lb 6.4 oz (74.6 kg)  Height: 5\' 2"  (1.575 m)   Body mass index is 30.07 kg/m.  General appearance: alert, no distress, WD/WN, female HEENT: normocephalic, sclerae anicteric, TMs pearly on right ear, nares patent, no discharge or erythema, pharynx normal. left with cerumen impaction Oral cavity: MMM, no lesions, scalloped tongue Neck: supple, no lymphadenopathy, no thyromegaly, no masses Heart: RRR, normal S1, S2 prominent S2, no  murmurs Lungs: CTA bilaterally, no wheezes, rhonchi, or rales Abdomen: +bs, soft, non tender, non distended, no masses, no hepatomegaly, no splenomegaly Musculoskeletal: nontender, no swelling, Negative straight leg, + right SI pain.  Extremities: no edema, no cyanosis, no clubbing Pulses: 2+ symmetric, upper and lower extremities, normal cap refill Neurological: alert, oriented x 3, CN2-12 intact, strength normal upper extremities and lower extremities, sensation normal throughout, DTRs 2+ throughout, no cerebellar signs, gait normal Psychiatric: normal affect, behavior normal, pleasant   Medicare Attestation I have personally reviewed: The patient's medical and social history Their use of alcohol, tobacco or illicit drugs Their current medications and supplements The patient's functional ability including ADLs,fall risks, home safety risks,  cognitive, and hearing and visual impairment Diet and physical activities Evidence for depression or mood disorders  The patient's weight, height, BMI, and visual acuity have been recorded in the chart.  I have made referrals, counseling, and provided education to the patient based on review of the above and I have provided the patient with a written personalized care plan for preventive services.     Quentin Mulling, PA-C   01/31/2019

## 2019-01-31 NOTE — Patient Instructions (Addendum)
Can call  Dr. Toy Cookey to get evaluated for dental sleep appliance. # [phone]  Dr. Clearence Ped in [facility] # [phone]  Dr. Oneal Grout  Phone: # [phone]  We will send notes. Call and get price quote.   Call dr. Watt Climes, if can not do colonoscopy will send in cologuard, let us know  COLOGUARD INFORMATION Cologuard is an easy to use noninvasive colon cancer screening test based on the latest advances in stool DNA science.   Colon cancer is 3rd most diagnosed cancer and 2nd leading cause of death in both men and women 21 years of age and older despite being one of the most preventable and treatable cancers if found early.  4 of out 5 people diagnosed with colon cancer have NO prior family history.  When caught EARLY 90% of colon cancer is curable.   More than 92% of cologuard patients have NO out of pocket cost for screening however only your insurer can confirm how Cologuard would be covered for you. [facility] has a team of specialist that can help you contact your insurer and ask the right questions. Please call [phone] so they can help.   You will receive a short call from [place] support center at [employer], when you receive a call they will say they are from [place],  to confirm your mailing address and give you more information.  When they calll you, it will appear on the caller ID as "[employer]" or in some cases only this number will appear, [phone].   [employer] will ship your collection kit directly to you. You will collect a single stool sample in the privacy of your own home, no special preparation required. You will return the kit via [employer] pre-paid shipping or pick-up, in the same box it arrived in. Then I will contact you to discuss your results after I receive them from the laboratory.   If you have any questions or concerns, [facility] Customer Support Specialist are available 24 hours a day, 7  days a week at [phone] or go to [email or web address].    Ask insurance and pharmacy about shingrix - new vaccine   Can go to [email or web address] for more information  Shingrix Vaccination  Two vaccines are licensed and recommended to prevent shingles in the [place] Zoster vaccine live (ZVL, Zostavax) has been in use since 2006. Recombinant zoster vaccine (RZV, Shingrix), has been in use since 2017 and is recommended by ACIP as the preferred shingles vaccine.  What Everyone Should Know about Shingles Vaccine (Shingrix) One of the Recommended Vaccines by Disease Shingles vaccination is the only way to protect against shingles and postherpetic neuralgia (PHN), the most common complication from shingles. CDC recommends that healthy adults 50 years and older get two doses of the shingles vaccine called Shingrix (recombinant zoster vaccine), separated by 2 to 6 months, to prevent shingles and the complications from the disease. Your doctor or pharmacist can give you Shingrix as a shot in your upper arm. Shingrix provides strong protection against shingles and PHN. Two doses of Shingrix is more than 90% effective at preventing shingles and PHN. Protection stays above 85% for at least the first four years after you get vaccinated. Shingrix is the preferred vaccine, over Zostavax (zoster vaccine live), a shingles vaccine in use since 2006. Zostavax may still be used to prevent shingles in healthy adults 60 years and older. For example, you could use Zostavax if a person is  allergic to Shingrix, prefers Zostavax, or requests immediate vaccination and Shingrix is unavailable. Who Should Get Shingrix? Healthy adults 50 years and older should get two doses of Shingrix, separated by 2 to 6 months. You should get Shingrix even if in the past you . had shingles  . received Zostavax  . are not sure if you had chickenpox There is no maximum age for getting  Shingrix. If you had shingles in the past, you can get Shingrix to help prevent future occurrences of the disease. There is no specific length of time that you need to wait after having shingles before you can receive Shingrix, but generally you should make sure the shingles rash has gone away before getting vaccinated. You can get Shingrix whether or not you remember having had chickenpox in the past. Studies show that more than 99% of Americans 40 years and older have had chickenpox, even if they don't remember having the disease. Chickenpox and shingles are related because they are caused by the same virus (varicella zoster virus). After a person recovers from chickenpox, the virus stays dormant (inactive) in the body. It can reactivate years later and cause shingles. If you had Zostavax in the recent past, you should wait at least eight weeks before getting Shingrix. Talk to your healthcare provider to determine the best time to get Shingrix. Shingrix is available in [employer] and pharmacies. To find doctor's offices or pharmacies near you that offer the vaccine, visit HealthMap Vaccine FinderExternal. If you have questions about Shingrix, talk with your healthcare provider. Vaccine for Those 38 Years and Older  Shingrix reduces the risk of shingles and PHN by more than 90% in people 107 and older. CDC recommends the vaccine for healthy adults 17 and older.  Who Should Not Get Shingrix? You should not get Shingrix if you: . have ever had a severe allergic reaction to any component of the vaccine or after a dose of Shingrix  . tested negative for immunity to varicella zoster virus. If you test negative, you should get chickenpox vaccine.  . currently have shingles  . currently are pregnant or breastfeeding. Women who are pregnant or breastfeeding should wait to get Shingrix.  Marland Kitchen receive specific antiviral drugs (acyclovir, famciclovir, or valacyclovir) 24 hours before vaccination (avoid use of  these antiviral drugs for 14 days after vaccination)- zoster vaccine live only If you have a minor acute (starts suddenly) illness, such as a cold, you may get Shingrix. But if you have a moderate or severe acute illness, you should usually wait until you recover before getting the vaccine. This includes anyone with a temperature of 101.24F or higher. The side effects of the Shingrix are temporary, and usually last 2 to 3 days. While you may experience pain for a few days after getting Shingrix, the pain will be less severe than having shingles and the complications from the disease. How Well Does Shingrix Work? Two doses of Shingrix provides strong protection against shingles and postherpetic neuralgia (PHN), the most common complication of shingles. . In adults 4 to 66 years old who got two doses, Shingrix was 97% effective in preventing shingles; among adults 70 years and older, Shingrix was 91% effective.  . In adults 28 to 66 years old who got two doses, Shingrix was 91% effective in preventing PHN; among adults 70 years and older, Shingrix was 89% effective. Shingrix protection remained high (more than 85%) in people 70 years and older throughout the four years following vaccination. Since your  risk of shingles and PHN increases as you get older, it is important to have strong protection against shingles in your older years. Top of Page  What Are the Possible Side Effects of Shingrix? Studies show that Shingrix is safe. The vaccine helps your body create a strong defense against shingles. As a result, you are likely to have temporary side effects from getting the shots. The side effects may affect your ability to do normal daily activities for 2 to 3 days. Most people got a sore arm with mild or moderate pain after getting Shingrix, and some also had redness and swelling where they got the shot. Some people felt tired, had muscle pain, a headache, shivering, fever, stomach pain, or nausea. About 1  out of 6 people who got Shingrix experienced side effects that prevented them from doing regular activities. Symptoms went away on their own in about 2 to 3 days. Side effects were more common in younger people. You might have a reaction to the first or second dose of Shingrix, or both doses. If you experience side effects, you may choose to take over-the-counter pain medicine such as ibuprofen or acetaminophen. If you experience side effects from Shingrix, you should report them to the Vaccine Adverse Event Reporting System (VAERS). Your doctor might file this report, or you can do it yourself through the VAERS websiteExternal, or by calling [phone]. If you have any questions about side effects from Shingrix, talk with your doctor. The shingles vaccine does not contain thimerosal (a preservative containing mercury). Top of Page  When Should I See a Doctor Because of the Side Effects I Experience From Shingrix? In clinical trials, Shingrix was not associated with serious adverse events. In fact, serious side effects from vaccines are extremely rare. For example, for every 1 million doses of a vaccine given, only one or two people may have a severe allergic reaction. Signs of an allergic reaction happen within minutes or hours after vaccination and include hives, swelling of the face and throat, difficulty breathing, a fast heartbeat, dizziness, or weakness. If you experience these or any other life-threatening symptoms, see a doctor right away. Shingrix causes a strong response in your immune system, so it may produce short-term side effects more intense than you are used to from other vaccines. These side effects can be uncomfortable, but they are expected and usually go away on their own in 2 or 3 days. Top of Page  How Can I Pay For Shingrix? There are several ways shingles vaccine may be paid for: Medicare . Medicare Part D plans cover the shingles vaccine, but there may be a cost to you  depending on your plan. There may be a copay for the vaccine, or you may need to pay in full then get reimbursed for a certain amount.  . Medicare Part B does not cover the shingles vaccine. Medicaid . Medicaid may or may not cover the vaccine. Contact your insurer to find out. Private health insurance . Many private health insurance plans will cover the vaccine, but there may be a cost to you depending on your plan. Contact your insurer to find out. Vaccine assistance programs . Some pharmaceutical companies provide vaccines to eligible adults who cannot afford them. You may want to check with the vaccine manufacturer, [employer], about Shingrix. If you do not currently have health insurance, learn more about affordable health coverage optionsExternal. To find doctor's offices or pharmacies near you that offer the vaccine, visit HealthMap Vaccine FinderExternal.

## 2019-02-01 LAB — COMPLETE METABOLIC PANEL WITH GFR
AG Ratio: 1.8 (calc) (ref 1.0–2.5)
ALT: 28 U/L (ref 6–29)
AST: 25 U/L (ref 10–35)
Albumin: 4.4 g/dL (ref 3.6–5.1)
Alkaline phosphatase (APISO): 90 U/L (ref 37–153)
BUN: 17 mg/dL (ref 7–25)
CO2: 27 mmol/L (ref 20–32)
Calcium: 10.1 mg/dL (ref 8.6–10.4)
Chloride: 102 mmol/L (ref 98–110)
Creat: 0.87 mg/dL (ref 0.50–0.99)
GFR, Est African American: 80 mL/min/{1.73_m2} (ref >=60)
GFR, Est Non African American: 69 mL/min/{1.73_m2} (ref >=60)
Globulin: 2.5 g/dL (calc) (ref 1.9–3.7)
Glucose, Bld: 84 mg/dL (ref 65–99)
Potassium: 4.6 mmol/L (ref 3.5–5.3)
Sodium: 139 mmol/L (ref 135–146)
Total Bilirubin: 0.6 mg/dL (ref 0.2–1.2)
Total Protein: 6.9 g/dL (ref 6.1–8.1)

## 2019-02-01 LAB — URINALYSIS, ROUTINE W REFLEX MICROSCOPIC
Bacteria, UA: NONE SEEN /HPF
Bilirubin Urine: NEGATIVE
Glucose, UA: NEGATIVE
Hgb urine dipstick: NEGATIVE
Hyaline Cast: NONE SEEN /LPF
Ketones, ur: NEGATIVE
Nitrite: NEGATIVE
Protein, ur: NEGATIVE
RBC / HPF: NONE SEEN /HPF (ref 0–2)
Specific Gravity, Urine: 1.012 (ref 1.001–1.03)
Squamous Epithelial / HPF: NONE SEEN /HPF (ref <=5)
pH: 5.5 (ref 5.0–8.0)

## 2019-02-01 LAB — CBC WITH DIFFERENTIAL/PLATELET
Absolute Monocytes: 480 cells/uL (ref 200–950)
Basophils Absolute: 51 cells/uL (ref 0–200)
Basophils Relative: 0.8 %
Eosinophils Absolute: 218 cells/uL (ref 15–500)
Eosinophils Relative: 3.4 %
HCT: 47.8 % — ABNORMAL HIGH (ref 35.0–45.0)
Hemoglobin: 15.7 g/dL — ABNORMAL HIGH (ref 11.7–15.5)
Lymphs Abs: 2093 cells/uL (ref 850–3900)
MCH: 28.6 pg (ref 27.0–33.0)
MCHC: 32.8 g/dL (ref 32.0–36.0)
MCV: 87.1 fL (ref 80.0–100.0)
MPV: 10.1 fL (ref 7.5–12.5)
Monocytes Relative: 7.5 %
Neutro Abs: 3558 cells/uL (ref 1500–7800)
Neutrophils Relative %: 55.6 %
Platelets: 356 10*3/uL (ref 140–400)
RBC: 5.49 10*6/uL — ABNORMAL HIGH (ref 3.80–5.10)
RDW: 12.8 % (ref 11.0–15.0)
Total Lymphocyte: 32.7 %
WBC: 6.4 10*3/uL (ref 3.8–10.8)

## 2019-02-01 LAB — HEMOGLOBIN A1C
Hgb A1c MFr Bld: 5.7 % of total Hgb — ABNORMAL HIGH (ref <5.7)
Mean Plasma Glucose: 117 (calc)
eAG (mmol/L): 6.5 (calc)

## 2019-02-01 LAB — LIPID PANEL
Cholesterol: 209 mg/dL — ABNORMAL HIGH (ref <200)
HDL: 46 mg/dL — ABNORMAL LOW (ref >=50)
LDL Cholesterol (Calc): 127 mg/dL (calc) — ABNORMAL HIGH
Non-HDL Cholesterol (Calc): 163 mg/dL (calc) — ABNORMAL HIGH (ref <130)
Total CHOL/HDL Ratio: 4.5 (calc) (ref <5.0)
Triglycerides: 214 mg/dL — ABNORMAL HIGH (ref <150)

## 2019-02-01 LAB — IRON, TOTAL/TOTAL IRON BINDING CAP
%SAT: 23 % (calc) (ref 16–45)
Iron: 92 ug/dL (ref 45–160)
TIBC: 402 mcg/dL (calc) (ref 250–450)

## 2019-02-01 LAB — MICROALBUMIN / CREATININE URINE RATIO
Creatinine, Urine: 83 mg/dL (ref 20–275)
Microalb Creat Ratio: 17 mcg/mg creat (ref <30)
Microalb, Ur: 1.4 mg/dL

## 2019-02-01 LAB — VITAMIN B12: Vitamin B-12: 952 pg/mL (ref 200–1100)

## 2019-02-01 LAB — FERRITIN: Ferritin: 33 ng/mL (ref 16–288)

## 2019-02-01 LAB — TSH: TSH: 1.19 mIU/L (ref 0.40–4.50)

## 2019-02-01 LAB — MAGNESIUM: Magnesium: 2.1 mg/dL (ref 1.5–2.5)

## 2019-02-01 LAB — VITAMIN D 25 HYDROXY (VIT D DEFICIENCY, FRACTURES): Vit D, 25-Hydroxy: 120 ng/mL — ABNORMAL HIGH (ref 30–100)

## 2019-02-26 DIAGNOSIS — M7671 Peroneal tendinitis, right leg: Secondary | ICD-10-CM | POA: Diagnosis not present

## 2019-02-26 DIAGNOSIS — M722 Plantar fascial fibromatosis: Secondary | ICD-10-CM | POA: Diagnosis not present

## 2019-02-26 NOTE — Progress Notes (Signed)
Chief Complaint: Patient presents for evaluation of skin lesions. Patient has erythematous, scaly lesions on lower leg, changing in shape.   Has had MRSA infection in the past, has a bump in the same place that she had the infection before. Has bump on her left lateral thigh  Blood pressure 136/84, pulse 93, temperature (!) 97.2 F (36.2 C), weight 167 lb 3.2 oz (75.8 kg), SpO2 94 %.   Exam: normal complete skin exam, no suspicious lesions, seborrheic keratoses  Left lateral thigh with 5 mm hard tender nodule, no surrounding induration or erythema, no discharge.      Procedure Details   The risks, benefits, indications, potential complications, and alternatives were explained to the patient and informed consent obtained.  Liquid nitrogen was use in a 3 freeze and thaw technique. The patient tolerated the procedure well.   Condition: Stable  Complications:  None  Diagnosis: Seb. Dermatitis History of MRSA infection  Procedure code:  50277 [id number]  Plan: 1. Patient educated that the area will begin to heal in approximately a week.  2. Warning signs of infection were reviewed.    3. Recommended that the patient use OTC acetaminophen as needed for pain.   4. Given mupirocin cream refill, given RX for bactrim to have incase the area gets worse but patient reassured.   Future Appointments  Date Time Provider Department Center  05/17/2019 10:30 AM Quentin Mulling, PA-C [facility] None  02/04/2020 10:00 AM Quentin Mulling, PA-C [facility] None

## 2019-02-27 ENCOUNTER — Other Ambulatory Visit: Payer: Self-pay

## 2019-02-27 ENCOUNTER — Encounter: Payer: Self-pay | Admitting: Physician Assistant

## 2019-02-27 ENCOUNTER — Ambulatory Visit (INDEPENDENT_AMBULATORY_CARE_PROVIDER_SITE_OTHER): Payer: Medicare Other | Admitting: Physician Assistant

## 2019-02-27 VITALS — BP 136/84 | HR 93 | Temp 97.2°F | Wt 167.2 lb

## 2019-02-27 DIAGNOSIS — L089 Local infection of the skin and subcutaneous tissue, unspecified: Secondary | ICD-10-CM

## 2019-02-27 DIAGNOSIS — L570 Actinic keratosis: Secondary | ICD-10-CM | POA: Diagnosis not present

## 2019-02-27 DIAGNOSIS — A4902 Methicillin resistant Staphylococcus aureus infection, unspecified site: Secondary | ICD-10-CM | POA: Diagnosis not present

## 2019-02-27 MED ORDER — "BD SYRINGE/NEEDLE 25G X 5/8"" 1 ML MISC": 1 refills | Status: DC

## 2019-02-27 MED ORDER — MUPIROCIN CALCIUM 2 % EX CREA: TOPICAL_CREAM | CUTANEOUS | 3 refills | Status: AC

## 2019-02-27 NOTE — Patient Instructions (Signed)
MRSA Infection, Self-Care, Adult Methicillin-resistant Staphylococcus aureus (MRSA) infection is caused by bacteria that no longer respond to antibiotic medicines. MRSA infection can be hard to treat. Self-care is important after being diagnosed with MRSA. Following instructions for self-care will:  Help you heal well.  Prevent the infection from spreading to others. Your health care provider may also give you more specific instructions. If you have problems or questions, contact your health care provider. What are the risks? MRSA can be on the skin or in the nose of many people without causing problems. This is called MRSA colonization. However, MRSA can cause problems for you and others if it enters the body through a cut, a sore, or an invasive medical procedure or device.  If MRSA enters your body, it may cause serious problems, such as: ? Skin infections. ? Bone or joint infections. ? Pneumonia. ? Bloodstream infections (sepsis).  If you have these infections, MRSA may spread to others, including: ? Visitors or other patients in the hospital. ? Friends, family, or other people at home or in your community. Supplies needed:  Soap and water.  Germ-free (sterile) dressing.  Alcohol-based hand sanitizer (optional).  Home cleaning solutions that contain bleach.  Clean or disposable towels. How to prevent MRSA infections Take these steps to avoid getting another MRSA infection and to prevent the bacteria from spreading to others. Hand washing   Wash your hands frequently with soap and water. If soap and water are not available, use an alcohol-based hand sanitizer. Dry your hands with a clean or disposable towel.  Make sure that everyone in the household washes their hands often. Wound care  If you have a wound, follow instructions from your health care provider about how to take care of your wound. Make sure you:  Wash your hands with soap and water before and after you  change your bandage (dressing). If soap and water are not available, use an alcohol-based hand sanitizer.  Change your dressing as told by your health care provider.  Leave any stitches (sutures), skin glue, or adhesive strips in place. These skin closures may need to be in place for 2 weeks or longer. If adhesive strip edges start to loosen and curl up, you may trim the loose edges. Do not remove adhesive strips completely unless your health care provider tells you to do that.  Clean wounds, cuts, and abrasions with soap and water and cover them with dry, sterile dressings until they heal.  Check your wound every day for signs of infection. Check for: ? More redness, swelling, or pain. ? More fluid or blood. ? Warmth. ? Pus or a bad smell.  If you have a wound that seems to be infected, ask your health care provider if a culture should be done for MRSA and other bacteria. Personal hygiene  Maintain good hygiene by bathing often and keeping your body clean.  Wash hands before preparing food.  Always shower after exercising.  Wash towels, bedding, and clothes in the washing machine with detergent and hot water. Dry them in a hot dryer. General tips  Take your antibiotic medicine as told by your health care provider. Take them only when absolutely necessary. Do not stop taking the antibiotic even if you start to feel better.  Avoid close contact with others as much as possible.  Do not use towels, razors, toothbrushes, bedding, or other items that will be used by others.  Clean surfaces regularly to remove germs (disinfection). Use products or solutions  that contain bleach. Make sure you disinfect bathroom surfaces, food preparation areas, and doorknobs. Follow these instructions at home:  Take over-the-counter and prescription medicines only as told by your health care provider.  Tell all health care providers who care for you that you have MRSA or that you have had MRSA.  If  you are breastfeeding, talk to your health care provider about MRSA. You may be asked to temporarily stop breastfeeding.  If you have an invasive medical device, make sure that you know how to take care of it to prevent infection.  Keep all follow-up visits as told by your health care provider. This is important. Contact a health care provider if:  Your infection seems to be getting worse. Signs may include: ? Warmth, redness, or tenderness around your wound site. ? A red line that spreads from your infection site. ? A dark color in the area around your infection. ? Wound drainage that is tan, yellow, or green. ? A bad smell coming from your wound.  You have symptoms of a new MRSA infection: ? A pus-filled pimple. ? A boil on your skin. ? Pus draining from your skin. ? A sore (abscess) under your skin or somewhere in your body. ? Fever with or without chills. Get help right away if:  You have trouble breathing.  You feel nauseous, you vomit, or you cannot take medicine without vomiting.  You have chest pain. These symptoms may represent a serious problem that is an emergency. Do not wait to see if the symptoms will go away. Get medical help right away. Call your local emergency services (911 in the [place] Do not drive yourself to the hospital. Summary  Self-care is important after being diagnosed with MRSA. This will help you heal well and prevent the infection from spreading to others.  Wash your hands often, avoid close contact with others, and avoid sharing towels, razors, toothbrushes, and bedding with other people. This will help prevent the infection from spreading to others.  If you are being treated for a MRSA infection, make sure to take over-the-counter and prescription medicines as told. Finish all antibiotic medicine even if you start to feel better.  If you have a wound, follow instructions from your health care provider about how to take care of it. Check your wound  every day for signs of infection. This information is not intended to replace advice given to you by your health care provider. Make sure you discuss any questions you have with your health care provider. Document Revised: 04/26/2018 Document Reviewed: 04/27/2018 Elsevier Patient Education  2020 [employer].

## 2019-02-28 ENCOUNTER — Encounter: Payer: Self-pay | Admitting: Physician Assistant

## 2019-03-08 ENCOUNTER — Other Ambulatory Visit: Payer: Self-pay | Admitting: Internal Medicine

## 2019-03-08 ENCOUNTER — Other Ambulatory Visit: Payer: Self-pay

## 2019-03-08 DIAGNOSIS — F988 Other specified behavioral and emotional disorders with onset usually occurring in childhood and adolescence: Secondary | ICD-10-CM

## 2019-03-09 MED ORDER — AMPHETAMINE-DEXTROAMPHETAMINE 20 MG PO TABS: ORAL_TABLET | ORAL | 0 refills | Status: DC

## 2019-03-10 ENCOUNTER — Other Ambulatory Visit: Payer: Self-pay | Admitting: Physician Assistant

## 2019-04-01 DIAGNOSIS — M47816 Spondylosis without myelopathy or radiculopathy, lumbar region: Secondary | ICD-10-CM | POA: Diagnosis not present

## 2019-04-05 ENCOUNTER — Encounter: Payer: Self-pay | Admitting: Internal Medicine

## 2019-04-06 DIAGNOSIS — Z23 Encounter for immunization: Secondary | ICD-10-CM | POA: Diagnosis not present

## 2019-04-11 ENCOUNTER — Other Ambulatory Visit: Payer: Self-pay | Admitting: Internal Medicine

## 2019-04-11 DIAGNOSIS — Z1231 Encounter for screening mammogram for malignant neoplasm of breast: Secondary | ICD-10-CM

## 2019-04-11 DIAGNOSIS — F419 Anxiety disorder, unspecified: Secondary | ICD-10-CM

## 2019-04-12 MED ORDER — ALPRAZOLAM 1 MG PO TABS: ORAL_TABLET | ORAL | 0 refills | Status: DC

## 2019-04-12 MED ORDER — TRIAMCINOLONE ACETONIDE 0.1 % EX OINT: 1.0000 "application " | TOPICAL_OINTMENT | Freq: Two times a day (BID) | CUTANEOUS | 1 refills | Status: DC

## 2019-05-04 DIAGNOSIS — Z23 Encounter for immunization: Secondary | ICD-10-CM | POA: Diagnosis not present

## 2019-05-06 NOTE — Progress Notes (Addendum)
Assessment and Plan:  Diana Wolfe was seen today for neck pain and shoulder pain.  Diagnoses and all orders for this visit:  Strain of left trapezius muscle, initial encounter Trapezius muscle spasm Discussed flexeril PRN in addition to supportive measure. Continue ice, Voltaren gel -     cyclobenzaprine (FLEXERIL) 5 MG tablet; Take 1 tablet (5 mg total) by mouth 3 (three) times daily as needed for muscle spasms. Continue Chiropractic treatment  Cervical radiculopathy Continue Meloxicam 15mg  daily with food.  Discussed prednisone as change in therapy, patient declined this today. Consider imaging is not improvement, buldging disc? -     Discontinue: lidocaine (LIDODERM) 5 %; Place 1 patch onto the skin daily. Remove & Discard patch within 12 hours or as directed by MD -     lidocaine (LIDODERM) 5 %; Place 1 patch onto the skin daily. Remove & Discard patch within 12 hours or as directed by MD     Further disposition pending results of labs. Discussed med's effects and SE's.   Over 30 minutes of face to face interview, exam, counseling, chart review, and critical decision making was performed.   Future Appointments  Date Time Provider [place]  05/17/2019 10:30 AM Vicie Mutters, PA-C [facility] None  05/20/2019 10:40 AM [facility] MM 3 [facility]  02/04/2020 10:00 AM Vicie Mutters, PA-C [facility] None    ------------------------------------------------------------------------------------------------------------------   HPI 69 y.o.female presents for evaluation of neck and shoulder pain that began little over a week.   She has tried lidoderm patches and reports this has helped some.  She has been taking Meloxicam 15mg  daily with food.  She has tried lidocain gel as she ran out of patches and was not using them at night.  She has also tried Voltaren gel which has not helped with her pain.  She reports she has also taken tylenol.  She is having trouble sleeping at  night and reports this pain wakes her up.  She has also been using ice and heat.  She reports that the heat feels better than the ice.  She has had one visit to the chiropractor for one treatment and has another scheduled for tomorrow.  She works with dogs and walks them daily.  She reports she has not had any decrease in strength or sensation.  She denies any numbness or tingling.  She denies any trauma.  She reports sshe has not had pain like this before in her neck.  She has had lower back pain/arthritis and had injections to help with this.   Her last OV was 01/31/19 for complete physical.  Past Medical History:  Diagnosis Date  . ADD (attention deficit disorder)   . Anxiety   . Depression   . GERD (gastroesophageal reflux disease)   . Hyperlipidemia   . Hypertension   . Hypothyroid   . PONV (postoperative nausea and vomiting)   . Vitamin D deficiency      Allergies  Allergen Reactions  . Levofloxacin     Neck sweling/difficult breathing  . Cefprozil   . Codeine     REACTION: vomiting  . Hydrocodone-Acetaminophen   . Morphine   . Pravastatin Swelling  . Lexapro [Escitalopram Oxalate] Other (See Comments)    Tapping/abnormal movements    Current Outpatient Medications on File Prior to Visit  Medication Sig  . ALPRAZolam (XANAX) 1 MG tablet 1/2-1 tablet as needed for sleep/anxiety  . amphetamine-dextroamphetamine (ADDERALL) 20 MG tablet Take 1/2 to 1 tablet daily if needed for ADD  and please try to limit to 5 days /week to avoid addiction Rx to last 6 weeks  . conjugated estrogens (PREMARIN) vaginal cream Place 1 Applicatorful vaginally daily.  . cyanocobalamin (,VITAMIN B-12,) 1000 MCG/ML injection Inject 1 ml (1000 mcg) into the Skin Monthly for Vitamin B12 Deficiency  . EPINEPHrine 0.15 MG/0.15ML IJ injection Inject 0.15 mLs (0.15 mg total) into the muscle as needed for anaphylaxis.  Marland Kitchen ketoconazole (NIZORAL) 2 % cream APPLY TO AFFECTED AREA(S) TWO TIMES A DAY  .  meclizine (ANTIVERT) 25 MG tablet 1/2-1 pill up to 3 times daily for motion sickness/dizziness (Patient taking differently: as needed. 1/2-1 pill up to 3 times daily for motion sickness/dizziness)  . meloxicam (MOBIC) 15 MG tablet TAKE 1/2 TO 1 TABLET DAILY WITH FOOD FOR PAIN & INFLAMMATION  . mupirocin cream (BACTROBAN) 2 % Apply to affected area 3 times daily  . omeprazole (PRILOSEC) 40 MG capsule Take 1 capsule (40 mg total) by mouth daily.  Marland Kitchen SYNTHROID 125 MCG tablet TAKE 1 TABLET DAILY ON AN EMPTY STOMACH WITH ONLY WATER FOR 30 MINUTES & NO ANTACID MEDS, CALCIUM OR MAGNESIUM FOR 4 HOURS & AVOID BIOTIN  . SYRINGE/NEEDLE, DISP, 1 ML (B-D SYRINGE/NEEDLE 1CC/25GX5/8) 25G X 5/8" 1 ML MISC 1 SQ injection once weekly  . triamcinolone ointment (KENALOG) 0.1 % Apply 1 application topically 2 (two) times daily.  . vitamin C (ASCORBIC ACID) 500 MG tablet Take 1,200 mg by mouth daily.  . Vitamin D, Ergocalciferol, (DRISDOL) 1.25 MG (50000 UNIT) CAPS capsule Take 1 capsule 3 x /week on Mon Wed & Fri for Severe Vitamin D Deficiency   No current facility-administered medications on file prior to visit.    ROS: all negative except above.   Physical Exam:  BP (!) 146/88   Pulse (!) 126   Temp (!) 97.3 F (36.3 C)   Wt 166 lb (75.3 kg)   SpO2 97%   BMI 30.36 kg/m   General Appearance: Well nourished, in no apparent distress. Eyes: PERRLA, EOMs, conjunctiva no swelling or erythema Sinuses: No Frontal/maxillary tenderness Neck: Supple, thyroid normal.  Tenderness to sternocleidomastoid, bilateral.  Edema noted to left trapezius, tender to palpation bilaterally.  Negative Brudzinski's & Kernig's sign. Respiratory: Respiratory effort normal, BS equal bilaterally without rales, rhonchi, wheezing or stridor.  Cardio: RRR with no MRGs. Brisk peripheral pulses without edema.  Lymphatics: Non tender without lymphadenopathy.  Musculoskeletal: Decreased ROM neck, left 45 degrees.  Full ROM other  extremities, 5/5 strength, normal gait.  Skin: Warm, dry without rashes, lesions, ecchymosis.  Neuro: Cranial nerves intact. Normal muscle tone, no cerebellar symptoms. Sensation intact.  Psych: Awake and oriented X 3, normal affect, Insight and Judgment appropriate.     Elder Negus, NP 2:26 PM [facility]

## 2019-05-07 ENCOUNTER — Other Ambulatory Visit: Payer: Self-pay

## 2019-05-07 ENCOUNTER — Ambulatory Visit (INDEPENDENT_AMBULATORY_CARE_PROVIDER_SITE_OTHER): Payer: Medicare Other | Admitting: Adult Health Nurse Practitioner

## 2019-05-07 ENCOUNTER — Encounter: Payer: Self-pay | Admitting: Adult Health Nurse Practitioner

## 2019-05-07 VITALS — BP 146/88 | HR 126 | Temp 97.3°F | Wt 166.0 lb

## 2019-05-07 DIAGNOSIS — M5412 Radiculopathy, cervical region: Secondary | ICD-10-CM | POA: Diagnosis not present

## 2019-05-07 DIAGNOSIS — M62838 Other muscle spasm: Secondary | ICD-10-CM | POA: Diagnosis not present

## 2019-05-07 DIAGNOSIS — S46812A Strain of other muscles, fascia and tendons at shoulder and upper arm level, left arm, initial encounter: Secondary | ICD-10-CM

## 2019-05-07 MED ORDER — LIDOCAINE 5 % EX PTCH: 1.0000 | MEDICATED_PATCH | CUTANEOUS | 3 refills | Status: DC

## 2019-05-07 MED ORDER — CYCLOBENZAPRINE HCL 5 MG PO TABS: 5.0000 mg | ORAL_TABLET | Freq: Three times a day (TID) | ORAL | 0 refills | Status: DC | PRN

## 2019-05-07 NOTE — Patient Instructions (Addendum)
We will send in cyclobenzaprine for you to take up to three times a day to help relax your muscles.    Try the exercises and other information below, meloxicam once during the day as needed (avoid taking other NSAIDS like Alleve or Ibuprofen while taking this) and then flexeril if needed at bedtime for muscle spasm. This can be taken up to every 8 hours, but causes sedation, so should not drive or operate heavy machinery while taking this medicine.   Go to the ER if you have any new weakness in your arms, trouble with your grip, worse headache ever, fever, chills. or have worsening pain.     Cervical Sprain A cervical sprain is a stretch or tear in one or more of the tough, cord-like tissues that connect bones (ligaments) in the neck. Cervical sprains can range from mild to severe. Severe cervical sprains can cause the spinal bones (vertebrae) in the neck to be unstable. This can lead to spinal cord damage and can result in serious nervous system problems. The amount of time that it takes for a cervical sprain to get better depends on the cause and extent of the injury. Most cervical sprains heal in 4-6 weeks. What are the causes? Cervical sprains may be caused by an injury (trauma), such as from a motor vehicle accident, a fall, or sudden forward and backward whipping movement of the head and neck (whiplash injury). Mild cervical sprains may be caused by wear and tear over time, such as from poor posture, sitting in a chair that does not provide support, or looking up or down for long periods of time. What increases the risk? The following factors may make you more likely to develop this condition:  Participating in activities that have a high risk of trauma to the neck. These include contact sports, auto racing, gymnastics, and diving.  Taking risks when driving or riding in a motor vehicle, such as speeding.  Having osteoarthritis of the spine.  Having poor strength and flexibility of  the neck.  A previous neck injury.  Having poor posture.  Spending a lot of time in certain positions that put stress on the neck, such as sitting at a computer for long periods of time.  What are the signs or symptoms? Symptoms of this condition include:  Pain, soreness, stiffness, tenderness, swelling, or a burning sensation in the front, back, or sides of the neck.  Sudden tightening of neck muscles that you cannot control (muscle spasms).  Pain in the shoulders or upper back.  Limited ability to move the neck.  Headache.  Dizziness.  Nausea.  Vomiting.  Weakness, numbness, or tingling in a hand or an arm.  Symptoms may develop right away after injury, or they may develop over a few days. In some cases, symptoms may go away with treatment and return (recur) over time. How is this diagnosed? This condition may be diagnosed based on:  Your medical history.  Your symptoms.  Any recent injuries or known neck problems that you have, such as arthritis in the neck.  A physical exam.  Imaging tests, such as: ? X-rays. ? MRI. ? CT scan.  How is this treated? This condition is treated by resting and icing the injured area and doing physical therapy exercises. Depending on the severity of your condition, treatment may also include:  Keeping your neck in place (immobilized) for periods of time. This may be done using: ? A cervical collar. This supports your chin and the  back of your head. ? A cervical traction device. This is a sling that holds up your head. This removes weight and pressure from your neck, and it may help to relieve pain.  Medicines that help to relieve pain and inflammation.  Medicines that help to relax your muscles (muscle relaxants).  Surgery. This is rare.  Follow these instructions at home: If you have a cervical collar:  Wear it as told by your health care provider. Do not remove the collar unless instructed by your health care  provider.  Ask your health care provider before you make any adjustments to your collar.  If you have long hair, keep it outside of the collar.  Ask your health care provider if you can remove the collar for cleaning and bathing. If you are allowed to remove the collar for cleaning or bathing: ? Follow instructions from your health care provider about how to remove the collar safely. ? Clean the collar by wiping it with mild soap and water and drying it completely. ? If your collar has removable pads, remove them every 1-2 days and wash them by hand with soap and water. Let them air-dry completely before you put them back in the collar. ? Check your skin under the collar for irritation or sores. If you see any, tell your health care provider. Managing pain, stiffness, and swelling  If directed, use a cervical traction device as told by your health care provider.  If directed, apply heat to the affected area before you do your physical therapy or as often as told by your health care provider. Use the heat source that your health care provider recommends, such as a moist heat pack or a heating pad. ? Place a towel between your skin and the heat source. ? Leave the heat on for 20-30 minutes. ? Remove the heat if your skin turns bright red. This is especially important if you are unable to feel pain, heat, or cold. You may have a greater risk of getting burned.  If directed, put ice on the affected area: ? Put ice in a plastic bag. ? Place a towel between your skin and the bag. ? Leave the ice on for 20 minutes, 2-3 times a day. Activity  Do not drive while wearing a cervical collar. If you do not have a cervical collar, ask your health care provider if it is safe to drive while your neck heals.  Do not drive or use heavy machinery while taking prescription pain medicine or muscle relaxants, unless your health care provider approves.  Do not lift anything that is heavier than 10 lb (4.5 kg)  until your health care provider tells you that it is safe.  Rest as directed by your health care provider. Avoid positions and activities that make your symptoms worse. Ask your health care provider what activities are safe for you.  If physical therapy was prescribed, do exercises as told by your health care provider or physical therapist. General instructions  Take over-the-counter and prescription medicines only as told by your health care provider.  Do not use any products that contain nicotine or tobacco, such as cigarettes and e-cigarettes. These can delay healing. If you need help quitting, ask your health care provider.  Keep all follow-up visits as told by your health care provider or physical therapist. This is important. How is this prevented? To prevent a cervical sprain from happening again:  Use and maintain good posture. Make any needed adjustments to your  workstation to help you use good posture.  Exercise regularly as directed by your health care provider or physical therapist.  Avoid risky activities that may cause a cervical sprain.  Contact a health care provider if:  You have symptoms that get worse or do not get better after 2 weeks of treatment.  You have pain that gets worse or does not get better with medicine.  You develop new, unexplained symptoms.  You have sores or irritated skin on your neck from wearing your cervical collar. Get help right away if:  You have severe pain.  You develop numbness, tingling, or weakness in any part of your body.  You cannot move a part of your body (you have paralysis).  You have neck pain along with: ? Severe dizziness. ? Headache. Summary  A cervical sprain is a stretch or tear in one or more of the tough, cord-like tissues that connect bones (ligaments) in the neck.  Cervical sprains may be caused by an injury (trauma), such as from a motor vehicle accident, a fall, or sudden forward and backward whipping  movement of the head and neck (whiplash injury).  Symptoms may develop right away after injury, or they may develop over a few days.  This condition is treated by resting and icing the injured area and doing physical therapy exercises. This information is not intended to replace advice given to you by your health care provider. Make sure you discuss any questions you have with your health care provider. Document Released: 12/05/2006 Document Revised: 10/07/2015 Document Reviewed: 10/07/2015 Elsevier Interactive Patient Education  2017 [employer]   Cervical Strain and Sprain Rehab Ask your health care provider which exercises are safe for you. Do exercises exactly as told by your health care provider and adjust them as directed. It is normal to feel mild stretching, pulling, tightness, or discomfort as you do these exercises, but you should stop right away if you feel sudden pain or your pain gets worse.Do not begin these exercises until told by your health care provider. Stretching and range of motion exercises These exercises warm up your muscles and joints and improve the movement and flexibility of your neck. These exercises also help to relieve pain, numbness, and tingling. Exercise A: Cervical side bend  1. Using good posture, sit on a stable chair or stand up. 2. Without moving your shoulders, slowly tilt your left / right ear to your shoulder until you feel a stretch in your neck muscles. You should be looking straight ahead. 3. Hold for __________ seconds. 4. Repeat with the other side of your neck. Repeat __________ times. Complete this exercise __________ times a day. Exercise B: Cervical rotation  1. Using good posture, sit on a stable chair or stand up. 2. Slowly turn your head to the side as if you are looking over your left / right shoulder. ? Keep your eyes level with the ground. ? Stop when you feel a stretch along the side and the back of your neck. 3. Hold for  __________ seconds. 4. Repeat this by turning to your other side. Repeat __________ times. Complete this exercise __________ times a day. Exercise C: Thoracic extension and pectoral stretch 1. Roll a towel or a small blanket so it is about 4 inches (10 cm) in diameter. 2. Lie down on your back on a firm surface. 3. Put the towel lengthwise, under your spine in the middle of your back. It should not be not under your shoulder blades. The  towel should line up with your spine from your middle back to your lower back. 4. Put your hands behind your head and let your elbows fall out to your sides. 5. Hold for __________ seconds. Repeat __________ times. Complete this exercise __________ times a day. Strengthening exercises These exercises build strength and endurance in your neck. Endurance is the ability to use your muscles for a long time, even after your muscles get tired. Exercise D: Upper cervical flexion, isometric 1. Lie on your back with a thin pillow behind your head and a small rolled-up towel under your neck. 2. Gently tuck your chin toward your chest and nod your head down to look toward your feet. Do not lift your head off the pillow. 3. Hold for __________ seconds. 4. Release the tension slowly. Relax your neck muscles completely before you repeat this exercise. Repeat __________ times. Complete this exercise __________ times a day. Exercise E: Cervical extension, isometric  1. Stand about 6 inches (15 cm) away from a wall, with your back facing the wall. 2. Place a soft object, about 6-8 inches (15-20 cm) in diameter, between the back of your head and the wall. A soft object could be a small pillow, a ball, or a folded towel. 3. Gently tilt your head back and press into the soft object. Keep your jaw and forehead relaxed. 4. Hold for __________ seconds. 5. Release the tension slowly. Relax your neck muscles completely before you repeat this exercise. Repeat __________ times.  Complete this exercise __________ times a day. Posture and body mechanics  Body mechanics refers to the movements and positions of your body while you do your daily activities. Posture is part of body mechanics. Good posture and healthy body mechanics can help to relieve stress in your body's tissues and joints. Good posture means that your spine is in its natural S-curve position (your spine is neutral), your shoulders are pulled back slightly, and your head is not tipped forward. The following are general guidelines for applying improved posture and body mechanics to your everyday activities. Standing  When standing, keep your spine neutral and keep your feet about hip-width apart. Keep a slight bend in your knees. Your ears, shoulders, and hips should line up.  When you do a task in which you stand in one place for a long time, place one foot up on a stable object that is 2-4 inches (5-10 cm) high, such as a footstool. This helps keep your spine neutral. Sitting   When sitting, keep your spine neutral and your keep feet flat on the floor. Use a footrest, if necessary, and keep your thighs parallel to the floor. Avoid rounding your shoulders, and avoid tilting your head forward.  When working at a desk or a computer, keep your desk at a height where your hands are slightly lower than your elbows. Slide your chair under your desk so you are close enough to maintain good posture.  When working at a computer, place your monitor at a height where you are looking straight ahead and you do not have to tilt your head forward or downward to look at the screen. Resting When lying down and resting, avoid positions that are most painful for you. Try to support your neck in a neutral position. You can use a contour pillow or a small rolled-up towel. Your pillow should support your neck but not push on it. This information is not intended to replace advice given to you by your health care  provider. Make sure  you discuss any questions you have with your health care provider. Document Released: 02/07/2005 Document Revised: 10/15/2015 Document Reviewed: 01/14/2015 [employer]vier Interactive Patient Education  [employer]2018 [employer]

## 2019-05-15 NOTE — Progress Notes (Addendum)
MEDICARE WELLNESS AND FOLLOW UP  Assessment:    Encounter for general adult medical examination with abnormal findings 1 year  Abnormal glucose -     Hemoglobin A1c ([facility])  Obstructive sleep apnea Given numbers for dentist that do mouth piece  Iron deficiency anemia, unspecified iron deficiency anemia type -     Iron,Total/Total Iron Binding Cap -     Ferritin  Attention deficit disorder, unspecified hyperactivity presence Continue medication ? Would benefit from new mouth piece for OSA  Vitamin D deficiency -     Vitamin D (25 hydroxy)  B12 deficiency -     Vitamin B12  BMI 29.0-29.9,adult  Overweight  -  discussion about weight loss, diet, and exercise -recommended diet heavy in fruits and veggies and low in animal meats, cheeses, and dairy products  Essential hypertension - continue medications, DASH diet, exercise and monitor at home. Call if greater than 130/80.  -     CBC with Differential/Platelet -     BASIC METABOLIC PANEL WITH GFR -     Hepatic function panel -     TSH  Hypothyroidism, unspecified type Hypothyroidism-check TSH level, continue medications the same, reminded to take on an empty stomach 30-50mins before food.  -     TSH  PULMONARY EMBOLISM, HX OF Monitor, was provoked after breaking leg  Hyperlipidemia, unspecified hyperlipidemia type -continue medications, check lipids, decrease fatty foods, increase activity.   Recurrent major depressive disorder, in full remission (HCC) -  stress management techniques discussed, increase water, good sleep hygiene discussed, increase exercise, and increase veggies.  Not on any medications at this time.   Medication management -     Magnesium  Over 40 minutes of exam, counseling, chart review and critical decision making was performed Future Appointments  Date Time Provider Department Center  05/20/2019 10:40 AM [facility] 3 [facility]  02/04/2020 10:00 AM Quentin Mulling, PA-C  [facility] None    Subjective:  Diana Wolfe is a 69 y.o. right handed female female who presents for medicare wellness and 3 month follow up.   She was seen 03/16 for neck/trap pain, worse with her arms above her head, has see chiropractor 3 x and states she is on muscle relaxer at night.   She states she was on a job this past week, the apartment was recently treated for bed bugs she waited 3 weeks however she states they did too much, she had immediate burning in her throat, headache and her tongue felt dry/odd. She showered 2 hours after the exposure at her cousins.   Temrid was one of the chemicals. Lasted until next morning. No symptoms now.    Her blood pressure has been controlled at home, today their BP is BP: 122/86 She does not workout, she is active with her dog. She denies chest pain, shortness of breath, dizziness.  BMI is Body mass index is 30.22 kg/m., she is working on diet and exercise. Wt Readings from Last 3 Encounters:  05/17/19 165 lb 3.2 oz (74.9 kg)  05/07/19 166 lb (75.3 kg)  02/27/19 167 lb 3.2 oz (75.8 kg)    She is not on cholesterol medication and denies myalgias. Her cholesterol is at goal. The cholesterol last visit was:   Lab Results  Component Value Date   CHOL 209 (H) 01/31/2019   HDL 46 (L) 01/31/2019   LDLCALC 127 (H) 01/31/2019   LDLDIRECT 138.2 04/25/2008   TRIG 214 (H) 01/31/2019   CHOLHDL 4.5 01/31/2019  She is on the adderall, 1/2 tablet 1-2 x a day mainly when she drives.  She has chronic joint pain bilateral feet and left knee, on meloxicam 2 days a week. Takes prilosec as needed for GERD.     Last A1C in the office was:  Lab Results  Component Value Date   HGBA1C 5.7 (H) 01/31/2019   Last GFR: Lab Results  Component Value Date   GFRNONAA 69 01/31/2019   She is on thyroid medication. Her medication was not changed last visit.   Lab Results  Component Value Date   TSH 1.19 01/31/2019  .  Patient is on Vitamin D  supplement.   Lab Results  Component Value Date   VD25OH 120 (H) 01/31/2019      Medication Review:  Current Outpatient Medications (Endocrine & Metabolic):  .  SYNTHROID 125 MCG tablet, TAKE 1 TABLET DAILY ON AN EMPTY STOMACH WITH ONLY WATER FOR 30 MINUTES & NO ANTACID MEDS, CALCIUM OR MAGNESIUM FOR 4 HOURS & AVOID BIOTIN  Current Outpatient Medications (Cardiovascular):  Marland Kitchen  EPINEPHrine 0.15 MG/0.15ML IJ injection, Inject 0.15 mLs (0.15 mg total) into the muscle as needed for anaphylaxis.   Current Outpatient Medications (Analgesics):  .  meloxicam (MOBIC) 15 MG tablet, TAKE 1/2 TO 1 TABLET DAILY WITH FOOD FOR PAIN & INFLAMMATION  Current Outpatient Medications (Hematological):  .  cyanocobalamin (,VITAMIN B-12,) 1000 MCG/ML injection, Inject 1 ml (1000 mcg) into the Skin Monthly for Vitamin B12 Deficiency  Current Outpatient Medications (Other):  Marland Kitchen  ALPRAZolam (XANAX) 1 MG tablet, 1/2-1 tablet as needed for sleep/anxiety .  conjugated estrogens (PREMARIN) vaginal cream, Place 1 Applicatorful vaginally daily. .  cyclobenzaprine (FLEXERIL) 5 MG tablet, Take 1 tablet (5 mg total) by mouth 3 (three) times daily as needed for muscle spasms. Marland Kitchen  ketoconazole (NIZORAL) 2 % cream, APPLY TO AFFECTED AREA(S) TWO TIMES A DAY .  lidocaine (LIDODERM) 5 %, Place 1 patch onto the skin daily. Remove & Discard patch within 12 hours or as directed by MD .  meclizine (ANTIVERT) 25 MG tablet, 1/2-1 pill up to 3 times daily for motion sickness/dizziness (Patient taking differently: as needed. 1/2-1 pill up to 3 times daily for motion sickness/dizziness) .  mupirocin cream (BACTROBAN) 2 %, Apply to affected area 3 times daily .  omeprazole (PRILOSEC) 40 MG capsule, Take 1 capsule (40 mg total) by mouth daily. .  SYRINGE/NEEDLE, DISP, 1 ML (B-D SYRINGE/NEEDLE 1CC/25GX5/8) 25G X 5/8" 1 ML MISC, 1 SQ injection once weekly .  triamcinolone ointment (KENALOG) 0.1 %, Apply 1 application topically 2 (two) times  daily. .  vitamin C (ASCORBIC ACID) 500 MG tablet, Take 1,200 mg by mouth daily. .  Vitamin D, Ergocalciferol, (DRISDOL) 1.25 MG (50000 UNIT) CAPS capsule, Take 1 capsule 3 x /week on Mon Wed & Fri for Severe Vitamin D Deficiency .  amphetamine-dextroamphetamine (ADDERALL) 20 MG tablet, Take 1/2 to 1 tablet daily if needed for ADD   Allergies  Allergen Reactions  . Levofloxacin     Neck sweling/difficult breathing  . Cefprozil   . Codeine     REACTION: vomiting  . Hydrocodone-Acetaminophen   . Morphine   . Pravastatin Swelling  . Lexapro [Escitalopram Oxalate] Other (See Comments)    Tapping/abnormal movements    Current Problems (verified) Patient Active Problem List   Diagnosis Date Noted  . B12 deficiency 01/31/2019  . Medication management 06/23/2014  . Abnormal glucose 03/27/2014  . Vaginal atrophy 03/27/2014  . Hyperlipidemia   .  Hypertension   . Anxiety   . GERD (gastroesophageal reflux disease)   . ADD (attention deficit disorder)   . Depression   . Vitamin D deficiency   . Hypothyroidism 04/25/2008  . Iron deficiency anemia 04/25/2008  . Obstructive sleep apnea 11/16/2007  . Allergic rhinitis 11/16/2007  . Headache 11/16/2007  . PULMONARY EMBOLISM, HX OF 11/16/2007    Screening Tests Immunization History  Administered Date(s) Administered  . Fluad Quad(high Dose 65+) 10/28/2018  . Influenza Whole 11/22/2007  . Influenza-Unspecified 10/28/2016  . Pneumococcal Conjugate-13 05/02/2017  . Pneumococcal Polysaccharide-23 10/11/2018  . Td 04/25/2008, 05/16/2018  . Varicella Zoster Immune Globulin 12/31/2014   Preventative care: Last colonoscopy: 2015 Dr.Magod due 2020 EGD 2017 Last mammogram: 02/2018 Last pap smear/pelvic exam: 2014 Dr. Seymour Bars DEXA: 07/2018 CT AB 09/2017 US soft tissue head 2018 MRI lumbar 2010  Prior vaccinations: TD or Tdap: 2020  Influenza: 2020 Pneumococcal: 2020 Prevnar13: 2019 Shingles/Zostavax: 2016 COVID has had both  shots  Names of Other Physician/Practitioners you currently use: 1. [facility] here for primary care 2. [address], eye doctor, last visit Jan 2021 3. Dr. Richardine Service, dentist, last visit yearly 2021 Patient Care Team: Lucky Cowboy, MD as PCP - General (Internal Medicine)  SURGICAL HISTORY She  has a past surgical history that includes Foot surgery (Right); Cesarean section; Tonsillectomy (2002); and Esophagogastroduodenoscopy (N/A, 09/02/2015). FAMILY HISTORY Her family history includes Cancer (age of onset: 46) in her mother; Heart disease in her father; Hypertension in her father. SOCIAL HISTORY She  reports that she has never smoked. She has never used smokeless tobacco. She reports current alcohol use. She reports that she does not use drugs.   Review of Systems  Constitutional: Negative.   HENT: Negative for ear pain.   Eyes: Negative.   Respiratory: Negative.   Cardiovascular: Negative.   Gastrointestinal: Negative.   Genitourinary: Negative.   Musculoskeletal: Negative for back pain.  Skin: Negative.     Objective:     Today's Vitals   05/17/19 1032  BP: 122/86  Pulse: 62  Temp: 98.6 F (37 C)  SpO2: 98%  Weight: 165 lb 3.2 oz (74.9 kg)  Height: 5\' 2"  (1.575 m)   Body mass index is 30.22 kg/m.  General appearance: alert, no distress, WD/WN, female HEENT: normocephalic, sclerae anicteric, TMs pearly on right ear, nares patent, no discharge or erythema, pharynx normal.  Oral cavity: MMM, no lesions, scalloped tongue Neck: supple, no lymphadenopathy, no thyromegaly, no masses Heart: RRR, normal S1, S2 prominent S2, no murmurs Lungs: CTA bilaterally, no wheezes, rhonchi, or rales Abdomen: +bs, soft, non tender, non distended, no masses, no hepatomegaly, no splenomegaly Musculoskeletal: nontender, no swelling, Negative straight leg, + right SI pain.  Extremities: no edema, no cyanosis, no clubbing Pulses: 2+  symmetric, upper and lower extremities, normal cap refill Neurological: alert, oriented x 3, CN2-12 intact, strength normal upper extremities and lower extremities, sensation normal throughout, DTRs 2+ throughout, no cerebellar signs, gait normal Psychiatric: normal affect, behavior normal, pleasant   Medicare Attestation I have personally reviewed: The patient's medical and social history Their use of alcohol, tobacco or illicit drugs Their current medications and supplements The patient's functional ability including ADLs,fall risks, home safety risks, cognitive, and hearing and visual impairment Diet and physical activities Evidence for depression or mood disorders  The patient's weight, height, BMI, and visual acuity have been recorded in the chart.  I have made referrals, counseling, and provided education to  the patient based on review of the above and I have provided the patient with a written personalized care plan for preventive services.     Vicie Mutters, PA-C   05/17/2019

## 2019-05-17 ENCOUNTER — Encounter: Payer: Self-pay | Admitting: Physician Assistant

## 2019-05-17 ENCOUNTER — Ambulatory Visit (INDEPENDENT_AMBULATORY_CARE_PROVIDER_SITE_OTHER): Payer: Medicare Other | Admitting: Physician Assistant

## 2019-05-17 ENCOUNTER — Other Ambulatory Visit: Payer: Self-pay

## 2019-05-17 VITALS — BP 122/86 | HR 62 | Temp 98.6°F | Ht 62.0 in | Wt 165.2 lb

## 2019-05-17 DIAGNOSIS — J309 Allergic rhinitis, unspecified: Secondary | ICD-10-CM

## 2019-05-17 DIAGNOSIS — Z0001 Encounter for general adult medical examination with abnormal findings: Secondary | ICD-10-CM | POA: Diagnosis not present

## 2019-05-17 DIAGNOSIS — E559 Vitamin D deficiency, unspecified: Secondary | ICD-10-CM

## 2019-05-17 DIAGNOSIS — D509 Iron deficiency anemia, unspecified: Secondary | ICD-10-CM | POA: Diagnosis not present

## 2019-05-17 DIAGNOSIS — E039 Hypothyroidism, unspecified: Secondary | ICD-10-CM

## 2019-05-17 DIAGNOSIS — F988 Other specified behavioral and emotional disorders with onset usually occurring in childhood and adolescence: Secondary | ICD-10-CM

## 2019-05-17 DIAGNOSIS — R6889 Other general symptoms and signs: Secondary | ICD-10-CM | POA: Diagnosis not present

## 2019-05-17 DIAGNOSIS — E538 Deficiency of other specified B group vitamins: Secondary | ICD-10-CM

## 2019-05-17 DIAGNOSIS — R519 Headache, unspecified: Secondary | ICD-10-CM

## 2019-05-17 DIAGNOSIS — K219 Gastro-esophageal reflux disease without esophagitis: Secondary | ICD-10-CM

## 2019-05-17 DIAGNOSIS — F419 Anxiety disorder, unspecified: Secondary | ICD-10-CM | POA: Diagnosis not present

## 2019-05-17 DIAGNOSIS — Z86718 Personal history of other venous thrombosis and embolism: Secondary | ICD-10-CM

## 2019-05-17 DIAGNOSIS — G4733 Obstructive sleep apnea (adult) (pediatric): Secondary | ICD-10-CM

## 2019-05-17 DIAGNOSIS — Z79899 Other long term (current) drug therapy: Secondary | ICD-10-CM

## 2019-05-17 DIAGNOSIS — R7309 Other abnormal glucose: Secondary | ICD-10-CM

## 2019-05-17 DIAGNOSIS — I10 Essential (primary) hypertension: Secondary | ICD-10-CM | POA: Diagnosis not present

## 2019-05-17 DIAGNOSIS — N952 Postmenopausal atrophic vaginitis: Secondary | ICD-10-CM

## 2019-05-17 DIAGNOSIS — F3342 Major depressive disorder, recurrent, in full remission: Secondary | ICD-10-CM

## 2019-05-17 DIAGNOSIS — M542 Cervicalgia: Secondary | ICD-10-CM

## 2019-05-17 DIAGNOSIS — Z0000 Encounter for general adult medical examination without abnormal findings: Secondary | ICD-10-CM

## 2019-05-17 DIAGNOSIS — E785 Hyperlipidemia, unspecified: Secondary | ICD-10-CM

## 2019-05-17 MED ORDER — MELOXICAM 15 MG PO TABS: ORAL_TABLET | ORAL | 1 refills | Status: DC

## 2019-05-17 MED ORDER — AMPHETAMINE-DEXTROAMPHETAMINE 20 MG PO TABS: ORAL_TABLET | ORAL | 0 refills | Status: DC

## 2019-05-17 NOTE — Patient Instructions (Signed)
Due this year with Dr. Ewing Schlein  Colon cancer is 3rd most diagnosed cancer and 2nd leading cause of death in both men and women 67 years of age and older despite being one of the most preventable and treatable cancers if found early.  4 of out 5 people diagnosed with colon cancer have NO prior family history.  When caught EARLY 90% of colon cancer is curable.  HOW TO SCHEDULE A MAMMOGRAM  [facility]  7 a.m.-6:30 p.m., Monday 7 a.m.-5 p.m., Tuesday-Friday Schedule an appointment by calling [phone].  [facility] Schedule an appointment by calling [phone].   General eating tips  What to Avoid . Avoid added sugars o Often added sugar can be found in processed foods such as many condiments, dry cereals, cakes, cookies, chips, crisps, crackers, candies, sweetened drinks, etc.  o Read labels and AVOID/DECREASE use of foods with the following in their ingredient list: Sugar, fructose, high fructose corn syrup, sucrose, glucose, maltose, dextrose, molasses, cane sugar, brown sugar, any type of syrup, agave nectar, etc.   . Avoid snacking in between meals- drink water or if you feel you need a snack, pick a high water content snack such as cucumbers, watermelon, or any veggie.  Marland Kitchen Avoid foods made with flour o If you are going to eat food made with flour, choose those made with whole-grains; and, minimize your consumption as much as is tolerable . Avoid processed foods o These foods are generally stocked in the middle of the grocery store.  o Focus on shopping on the perimeter of the grocery.  What to Include . Vegetables o GREEN LEAFY VEGETABLES: Kale, spinach, mustard greens, collard greens, cabbage, broccoli, etc. o OTHER: Asparagus, cauliflower, eggplant, carrots, peas, Brussel sprouts, tomatoes, bell peppers, zucchini, beets, cucumbers, etc. . Grains, seeds, and legumes o Beans: kidney beans, black eyed peas, garbanzo beans, black beans, pinto  beans, etc. o Whole, unrefined grains: brown rice, barley, bulgur, oatmeal, etc. . Healthy fats  o Avoid highly processed fats such as vegetable oil o Examples of healthy fats: avocado, olives, virgin olive oil, dark chocolate (?72% Cocoa), nuts (peanuts, almonds, walnuts, cashews, pecans, etc.) o Please still do small amount of these healthy fats, they are dense in calories.  . Low - Moderate Intake of Animal Sources of Protein o Meat sources: chicken, [place], salmon, tuna. Limit to 4 ounces of meat at one time or the size of your palm. o Consider limiting dairy sources, but when choosing dairy focus on: PLAIN [place] yogurt, cottage cheese, high-protein milk . Fruit o Choose berries

## 2019-05-18 LAB — COMPLETE METABOLIC PANEL WITH GFR
AG Ratio: 2.1 (calc) (ref 1.0–2.5)
ALT: 24 U/L (ref 6–29)
AST: 21 U/L (ref 10–35)
Albumin: 4.2 g/dL (ref 3.6–5.1)
Alkaline phosphatase (APISO): 79 U/L (ref 37–153)
BUN: 13 mg/dL (ref 7–25)
CO2: 27 mmol/L (ref 20–32)
Calcium: 9.8 mg/dL (ref 8.6–10.4)
Chloride: 104 mmol/L (ref 98–110)
Creat: 0.93 mg/dL (ref 0.50–0.99)
GFR, Est African American: 74 mL/min/{1.73_m2} (ref >=60)
GFR, Est Non African American: 64 mL/min/{1.73_m2} (ref >=60)
Globulin: 2 g/dL (calc) (ref 1.9–3.7)
Glucose, Bld: 96 mg/dL (ref 65–99)
Potassium: 4.1 mmol/L (ref 3.5–5.3)
Sodium: 140 mmol/L (ref 135–146)
Total Bilirubin: 0.5 mg/dL (ref 0.2–1.2)
Total Protein: 6.2 g/dL (ref 6.1–8.1)

## 2019-05-18 LAB — CBC WITH DIFFERENTIAL/PLATELET
Absolute Monocytes: 423 cells/uL (ref 200–950)
Basophils Absolute: 51 cells/uL (ref 0–200)
Basophils Relative: 0.7 %
Eosinophils Absolute: 299 cells/uL (ref 15–500)
Eosinophils Relative: 4.1 %
HCT: 45.2 % — ABNORMAL HIGH (ref 35.0–45.0)
Hemoglobin: 14.8 g/dL (ref 11.7–15.5)
Lymphs Abs: 1810 cells/uL (ref 850–3900)
MCH: 28.5 pg (ref 27.0–33.0)
MCHC: 32.7 g/dL (ref 32.0–36.0)
MCV: 87.1 fL (ref 80.0–100.0)
MPV: 9.8 fL (ref 7.5–12.5)
Monocytes Relative: 5.8 %
Neutro Abs: 4716 cells/uL (ref 1500–7800)
Neutrophils Relative %: 64.6 %
Platelets: 315 10*3/uL (ref 140–400)
RBC: 5.19 10*6/uL — ABNORMAL HIGH (ref 3.80–5.10)
RDW: 13.1 % (ref 11.0–15.0)
Total Lymphocyte: 24.8 %
WBC: 7.3 10*3/uL (ref 3.8–10.8)

## 2019-05-18 LAB — LIPID PANEL
Cholesterol: 209 mg/dL — ABNORMAL HIGH (ref <200)
HDL: 48 mg/dL — ABNORMAL LOW (ref >=50)
LDL Cholesterol (Calc): 126 mg/dL (calc) — ABNORMAL HIGH
Non-HDL Cholesterol (Calc): 161 mg/dL (calc) — ABNORMAL HIGH (ref <130)
Total CHOL/HDL Ratio: 4.4 (calc) (ref <5.0)
Triglycerides: 209 mg/dL — ABNORMAL HIGH (ref <150)

## 2019-05-18 LAB — MAGNESIUM: Magnesium: 2.1 mg/dL (ref 1.5–2.5)

## 2019-05-18 LAB — HEMOGLOBIN A1C
Hgb A1c MFr Bld: 5.6 % of total Hgb (ref <5.7)
Mean Plasma Glucose: 114 (calc)
eAG (mmol/L): 6.3 (calc)

## 2019-05-18 LAB — VITAMIN D 25 HYDROXY (VIT D DEFICIENCY, FRACTURES): Vit D, 25-Hydroxy: 96 ng/mL (ref 30–100)

## 2019-05-18 LAB — TSH: TSH: 1.14 mIU/L (ref 0.40–4.50)

## 2019-05-20 ENCOUNTER — Other Ambulatory Visit: Payer: Self-pay

## 2019-05-20 ENCOUNTER — Ambulatory Visit
Admission: RE | Admit: 2019-05-20 | Discharge: 2019-05-20 | Disposition: A | Discharge status: Home / Self Care | Payer: Medicare Other | Source: Ambulatory Visit | Attending: Internal Medicine | Admitting: Internal Medicine

## 2019-05-20 DIAGNOSIS — Z1231 Encounter for screening mammogram for malignant neoplasm of breast: Secondary | ICD-10-CM

## 2019-05-21 ENCOUNTER — Other Ambulatory Visit: Payer: Self-pay | Admitting: Internal Medicine

## 2019-05-21 DIAGNOSIS — R928 Other abnormal and inconclusive findings on diagnostic imaging of breast: Secondary | ICD-10-CM

## 2019-06-06 ENCOUNTER — Ambulatory Visit
Admission: RE | Admit: 2019-06-06 | Discharge: 2019-06-06 | Disposition: A | Discharge status: Home / Self Care | Payer: Medicare Other | Source: Ambulatory Visit | Attending: Internal Medicine | Admitting: Internal Medicine

## 2019-06-06 ENCOUNTER — Other Ambulatory Visit: Payer: Self-pay | Admitting: Internal Medicine

## 2019-06-06 ENCOUNTER — Other Ambulatory Visit: Payer: Self-pay

## 2019-06-06 DIAGNOSIS — R928 Other abnormal and inconclusive findings on diagnostic imaging of breast: Secondary | ICD-10-CM

## 2019-06-06 DIAGNOSIS — N6321 Unspecified lump in the left breast, upper outer quadrant: Secondary | ICD-10-CM | POA: Diagnosis not present

## 2019-07-05 ENCOUNTER — Telehealth: Payer: Self-pay | Admitting: Physician Assistant

## 2019-07-05 DIAGNOSIS — F988 Other specified behavioral and emotional disorders with onset usually occurring in childhood and adolescence: Secondary | ICD-10-CM

## 2019-07-05 MED ORDER — AMPHETAMINE-DEXTROAMPHETAMINE 20 MG PO TABS: ORAL_TABLET | ORAL | 0 refills | Status: DC

## 2019-07-05 NOTE — Telephone Encounter (Signed)
-----   Message from Gregery Na, CMA sent at 07/05/2019 11:41 AM EDT ----- Regarding: OFFICE NOTE Contact: [phone] REFILL ON ADDERALL---20 MGS    [facility]

## 2019-07-09 ENCOUNTER — Ambulatory Visit
Admission: RE | Admit: 2019-07-09 | Discharge: 2019-07-09 | Disposition: A | Discharge status: Home / Self Care | Payer: Medicare Other | Source: Ambulatory Visit | Attending: Internal Medicine | Admitting: Internal Medicine

## 2019-07-09 ENCOUNTER — Other Ambulatory Visit: Payer: Self-pay

## 2019-07-09 DIAGNOSIS — R928 Other abnormal and inconclusive findings on diagnostic imaging of breast: Secondary | ICD-10-CM

## 2019-07-09 DIAGNOSIS — N6012 Diffuse cystic mastopathy of left breast: Secondary | ICD-10-CM | POA: Diagnosis not present

## 2019-07-09 DIAGNOSIS — N6321 Unspecified lump in the left breast, upper outer quadrant: Secondary | ICD-10-CM | POA: Diagnosis not present

## 2019-07-09 HISTORY — PX: BREAST BIOPSY: SHX20

## 2019-07-28 DIAGNOSIS — F419 Anxiety disorder, unspecified: Secondary | ICD-10-CM

## 2019-07-29 MED ORDER — ALPRAZOLAM 1 MG PO TABS: ORAL_TABLET | ORAL | 0 refills | Status: DC

## 2019-08-16 DIAGNOSIS — N952 Postmenopausal atrophic vaginitis: Secondary | ICD-10-CM

## 2019-08-16 MED ORDER — PREMARIN 0.625 MG/GM VA CREA: 1.0000 | TOPICAL_CREAM | Freq: Every day | VAGINAL | 2 refills | Status: DC

## 2019-08-19 MED ORDER — IMVEXXY MAINTENANCE PACK 4 MCG VA INST: 4.0000 ug | VAGINAL_INSERT | VAGINAL | 4 refills | Status: DC

## 2019-08-19 NOTE — Addendum Note (Signed)
Addended by: Doree Albee on: 08/19/2019 08:35 AM   Modules accepted: Orders

## 2019-09-02 ENCOUNTER — Other Ambulatory Visit: Payer: Self-pay | Admitting: Physician Assistant

## 2019-09-09 ENCOUNTER — Other Ambulatory Visit: Payer: Self-pay | Admitting: Physician Assistant

## 2019-09-09 DIAGNOSIS — R21 Rash and other nonspecific skin eruption: Secondary | ICD-10-CM

## 2019-09-09 DIAGNOSIS — K219 Gastro-esophageal reflux disease without esophagitis: Secondary | ICD-10-CM

## 2019-09-30 ENCOUNTER — Telehealth: Payer: Self-pay | Admitting: Physician Assistant

## 2019-09-30 DIAGNOSIS — F988 Other specified behavioral and emotional disorders with onset usually occurring in childhood and adolescence: Secondary | ICD-10-CM

## 2019-09-30 MED ORDER — AMPHETAMINE-DEXTROAMPHETAMINE 20 MG PO TABS: ORAL_TABLET | ORAL | 0 refills | Status: DC

## 2019-09-30 NOTE — Addendum Note (Signed)
Addended by: Quentin Mulling R on: 09/30/2019 09:44 AM   Modules accepted: Orders

## 2019-09-30 NOTE — Telephone Encounter (Signed)
Patient is calling and asking for a refill of Adderall [facility] [place], [place] - [address] [place] [address] Phone: [phone] Fax: [phone]  [id number] - [place], [place] - [address] [address] Phone: [phone] Fax: [phone]    Recent Visits Date Type Provider Dept  05/17/19 Office Visit Quentin Mulling, PA-C [facility]  05/07/19 Office Visit Elder Negus, NP [facility]  02/27/19 Office Visit Quentin Mulling, PA-C [facility]  01/31/19 Office Visit Quentin Mulling, PA-C [facility]  08/02/18 Office Visit Quentin Mulling, PA-C [facility]  07/18/18 Office Visit Quentin Mulling, PA-C [facility]  05/16/18 Office Visit Quentin Mulling, PA-C [facility]  Showing recent visits within past 540 days with a meds authorizing provider and meeting all other requirements Future Appointments Date Type Provider Dept  02/04/20 Appointment Quentin Mulling, PA-C [facility]  Showing future appointments within next 150 days with a meds authorizing provider and meeting all other requirements

## 2019-10-20 ENCOUNTER — Other Ambulatory Visit: Payer: Self-pay | Admitting: Physician Assistant

## 2019-10-20 DIAGNOSIS — F419 Anxiety disorder, unspecified: Secondary | ICD-10-CM

## 2019-11-10 DIAGNOSIS — Z23 Encounter for immunization: Secondary | ICD-10-CM | POA: Diagnosis not present

## 2019-11-25 NOTE — Progress Notes (Signed)
Assessment and Plan:  Fatigue, unspecified type Check labs ? Need new OSA mouth piece versus CPAP Get colonoscopy  With hypotension will get cortisol/ACTH- had normal AB CT -     CBC with Differential/Platelet -     COMPLETE METABOLIC PANEL WITH GFR -     Vitamin B12 -     C-reactive protein -     B. burgdorfi antibodies  Hypothyroidism, unspecified type -     TSH -     T4, free Will switch to generic due to cost pending labs  Hypotension, unspecified hypotension type -     Cortisol -     ACTH Increase fluids, check labs for dehydration/anemia  B12 deficiency -     Vitamin B12  Anxiety -     busPIRone (BUSPAR) 5 MG tablet; Take 1 tablet (5 mg total) by mouth 3 (three) times daily as needed (anxiety). -     sertraline (ZOLOFT) 50 MG tablet; Take 1 tablet (50 mg total) by mouth daily. Get counseling, close follow up.   HPI 69 y.o.female with history of hypothyroidism, iron def, OSA, HA, history of PE, chol, HTN, anxiety, GERD, depression, B12 def  presents for worsening anxiety and depression.   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 She has xanax at night will take 1/2 a day. She is crying a lot. 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  She will wake up anxious and it will not go away. No Chest pain/no SOB  She does have an appointment with a therapist.   IE she went to drive to [place] but the thought of driving up there causes a lot of stress.   She has added magnesium,  5 HTP to help with stress x 1 month and can not tell a difference.   She states her BP has been low at home and she is not on any BP meds. It was 99/62, 110/59, 100/65, 95/55. She states she felt worse anxiety and worse fatigue. She will have HR in the 80's but she feels she has palpitations.  No dizziness, no nausea, no rashes, no joint pain, no tick  exposure, no diarrhea, constipation. She has had lower AB "gurgling" and decreased appetite. She is getting colonoscopy Monday with Dr. Ewing Schlein.   She has OSA, needs new mouth piece.   MGM 02/2019 CT AB 09/2017- normal  She is on her thyroid medication . She states she has been on brand name and wants to switch to generic.  Lab Results  Component Value Date   TSH 1.14 05/17/2019   She had normal iron/ferritin 01/2019.  She was on B12 shots but started on the B12 sublinual 3 months ago.  Lab Results  Component Value Date   VITAMINB12 952 01/31/2019      Blood pressure 120/80, pulse 100, temperature 97.8 F (36.6 C), weight 160 lb 12.8 oz (72.9 kg), SpO2 95 %.  Lab Results  Component Value Date   VITAMINB12 952 01/31/2019   Lab Results  Component Value Date   TSH 1.14 05/17/2019   BMI is Body mass index is 29.41 kg/m., she is working on diet and exercise. Wt Readings from Last 3 Encounters:  11/26/19 160 lb 12.8 oz (72.9 kg)  05/17/19 165 lb 3.2 oz (74.9 kg)  05/07/19 166  lb (75.3 kg)    Patient Active Problem List   Diagnosis Date Noted   B12 deficiency 01/31/2019   Medication management 06/23/2014   Abnormal glucose 03/27/2014   Vaginal atrophy 03/27/2014   Hyperlipidemia    Hypertension    Anxiety    GERD (gastroesophageal reflux disease)    ADD (attention deficit disorder)    Depression    Vitamin D deficiency    Hypothyroidism 04/25/2008   Iron deficiency anemia 04/25/2008   Obstructive sleep apnea 11/16/2007   Allergic rhinitis 11/16/2007   Headache 11/16/2007   PULMONARY EMBOLISM, HX OF 11/16/2007     Current Outpatient Medications (Endocrine & Metabolic):    SYNTHROID 125 MCG tablet, TAKE 1 TABLET DAILY ON AN EMPTY STOMACH WITH ONLY WATER FOR 30 MINUTES & NO ANTACID MEDS, CALCIUM OR MAGNESIUM FOR 4 HOURS & AVOID BIOTIN  Current Outpatient Medications (Cardiovascular):    EPINEPHrine 0.15 MG/0.15ML IJ injection, Inject 0.15  mLs (0.15 mg total) into the muscle as needed for anaphylaxis.   Current Outpatient Medications (Analgesics):    meloxicam (MOBIC) 15 MG tablet, TAKE 1/2 TO 1 TABLET DAILY WITH FOOD FOR PAIN & INFLAMMATION  Current Outpatient Medications (Hematological):    cyanocobalamin (,VITAMIN B-12,) 1000 MCG/ML injection, Inject 1 ml (1000 mcg) into the Skin Monthly for Vitamin B12 Deficiency  Current Outpatient Medications (Other):    ALPRAZolam (XANAX) 1 MG tablet, TAKE 1/2 TO 1 TABLET BY MOUTH AS NEEDED FOR SLEEP/ ANXIETY   amphetamine-dextroamphetamine (ADDERALL) 20 MG tablet, Take 1/2 to 1 tablet daily if needed for ADD   conjugated estrogens (PREMARIN) vaginal cream, Place 1 Applicatorful vaginally daily.   cyclobenzaprine (FLEXERIL) 5 MG tablet, Take 1 tablet (5 mg total) by mouth 3 (three) times daily as needed for muscle spasms.   Estradiol (IMVEXXY MAINTENANCE PACK) 4 MCG INST, Place 4 mcg vaginally every 3 (three) days.   ketoconazole (NIZORAL) 2 % cream, APPLY TO AFFECTED AREA(S) TWO TIMES A DAY   lidocaine (LIDODERM) 5 %, Place 1 patch onto the skin daily. Remove & Discard patch within 12 hours or as directed by MD   meclizine (ANTIVERT) 25 MG tablet, 1/2-1 pill up to 3 times daily for motion sickness/dizziness (Patient taking differently: as needed. 1/2-1 pill up to 3 times daily for motion sickness/dizziness)   mupirocin cream (BACTROBAN) 2 %, Apply to affected area 3 times daily   omeprazole (PRILOSEC) 40 MG capsule, Take 1 capsule Daily to Prevent Indigestion &  Heartburn   SYRINGE/NEEDLE, DISP, 1 ML (B-D SYRINGE/NEEDLE 1CC/25GX5/8) 25G X 5/8" 1 ML MISC, 1 SQ injection once weekly   triamcinolone ointment (KENALOG) 0.1 %, Apply 1 application topically 2 (two) times daily.   vitamin C (ASCORBIC ACID) 500 MG tablet, Take 1,200 mg by mouth daily.   Vitamin D, Ergocalciferol, (DRISDOL) 1.25 MG (50000 UNIT) CAPS capsule, Take 1 capsule 3 x /week on Mon Wed & Fri for Severe  Vitamin D Deficiency   busPIRone (BUSPAR) 5 MG tablet, Take 1 tablet (5 mg total) by mouth 3 (three) times daily as needed (anxiety).   sertraline (ZOLOFT) 50 MG tablet, Take 1 tablet (50 mg total) by mouth daily.  Allergies  Allergen Reactions   Levofloxacin     Neck sweling/difficult breathing   Cefprozil    Codeine     REACTION: vomiting   Hydrocodone-Acetaminophen    Morphine    Pravastatin Swelling   Lexapro [Escitalopram Oxalate] Other (See Comments)    Tapping/abnormal movements    ROS: all  negative except above.   Physical Exam: Filed Weights   11/26/19 1554  Weight: 160 lb 12.8 oz (72.9 kg)   BP 120/80    Pulse 100    Temp 97.8 F (36.6 C)    Wt 160 lb 12.8 oz (72.9 kg)    SpO2 95%    BMI 29.41 kg/m  General Appearance: Well nourished, in no apparent distress. Eyes: PERRLA, EOMs, conjunctiva no swelling or erythema Sinuses: No Frontal/maxillary tenderness ENT/Mouth: Ext aud canals clear, TMs without erythema, bulging. No erythema, swelling, or exudate on post pharynx.  Tonsils not swollen or erythematous. Hearing normal.  Neck: Supple, thyroid normal.  Respiratory: Respiratory effort normal, BS equal bilaterally without rales, rhonchi, wheezing or stridor.  Cardio: RRR with no MRGs. Brisk peripheral pulses without edema.  Abdomen: Soft, + BS.  Non tender, no guarding, rebound, hernias, masses. Lymphatics: Non tender without lymphadenopathy.  Musculoskeletal: Full ROM, 5/5 strength, normal gait.  Skin: Warm, dry without rashes, lesions, ecchymosis.  Neuro: Cranial nerves intact. Normal muscle tone, no cerebellar symptoms. Sensation intact.  Psych: Awake and oriented X 3, normal affect, Insight and Judgment appropriate.     Quentin Mulling, PA-C 5:01 PM [facility]

## 2019-11-26 ENCOUNTER — Other Ambulatory Visit: Payer: Self-pay

## 2019-11-26 ENCOUNTER — Encounter: Payer: Self-pay | Admitting: Physician Assistant

## 2019-11-26 ENCOUNTER — Ambulatory Visit (INDEPENDENT_AMBULATORY_CARE_PROVIDER_SITE_OTHER): Payer: Medicare Other | Admitting: Physician Assistant

## 2019-11-26 VITALS — BP 120/80 | HR 100 | Temp 97.8°F | Wt 160.8 lb

## 2019-11-26 DIAGNOSIS — E538 Deficiency of other specified B group vitamins: Secondary | ICD-10-CM

## 2019-11-26 DIAGNOSIS — E039 Hypothyroidism, unspecified: Secondary | ICD-10-CM

## 2019-11-26 DIAGNOSIS — I959 Hypotension, unspecified: Secondary | ICD-10-CM | POA: Diagnosis not present

## 2019-11-26 DIAGNOSIS — F419 Anxiety disorder, unspecified: Secondary | ICD-10-CM | POA: Diagnosis not present

## 2019-11-26 DIAGNOSIS — R5383 Other fatigue: Secondary | ICD-10-CM

## 2019-11-26 MED ORDER — SERTRALINE HCL 50 MG PO TABS: 50.0000 mg | ORAL_TABLET | Freq: Every day | ORAL | 2 refills | Status: DC

## 2019-11-26 MED ORDER — BUSPIRONE HCL 5 MG PO TABS: 5.0000 mg | ORAL_TABLET | Freq: Three times a day (TID) | ORAL | 0 refills | Status: DC | PRN

## 2019-11-26 NOTE — Patient Instructions (Addendum)
Increase water  Can call and suggest getting better OSA applicance or getting a CPAP.  Re evaluate sleep apea  Dr. Toni Arthurs to get evaluated for dental sleep appliance. # [phone]  Dr. Reatha Armour in [facility] # [phone]  Dr. Althea Grimmer  Phone: # [phone]  We will send notes. Call and get price quote.    Will try buspar 5 mg TID CAn go up to 30 mg up to three times a day

## 2019-11-28 ENCOUNTER — Other Ambulatory Visit: Payer: Self-pay | Admitting: Physician Assistant

## 2019-11-28 DIAGNOSIS — Z1159 Encounter for screening for other viral diseases: Secondary | ICD-10-CM | POA: Diagnosis not present

## 2019-11-28 LAB — C-REACTIVE PROTEIN: CRP: 7.4 mg/L (ref <8.0)

## 2019-11-28 LAB — CBC WITH DIFFERENTIAL/PLATELET
Absolute Monocytes: 677 cells/uL (ref 200–950)
Basophils Absolute: 94 cells/uL (ref 0–200)
Basophils Relative: 1 %
Eosinophils Absolute: 254 cells/uL (ref 15–500)
Eosinophils Relative: 2.7 %
HCT: 47.3 % — ABNORMAL HIGH (ref 35.0–45.0)
Hemoglobin: 15.9 g/dL — ABNORMAL HIGH (ref 11.7–15.5)
Lymphs Abs: 2679 cells/uL (ref 850–3900)
MCH: 29.2 pg (ref 27.0–33.0)
MCHC: 33.6 g/dL (ref 32.0–36.0)
MCV: 86.9 fL (ref 80.0–100.0)
MPV: 9.8 fL (ref 7.5–12.5)
Monocytes Relative: 7.2 %
Neutro Abs: 5696 cells/uL (ref 1500–7800)
Neutrophils Relative %: 60.6 %
Platelets: 398 10*3/uL (ref 140–400)
RBC: 5.44 10*6/uL — ABNORMAL HIGH (ref 3.80–5.10)
RDW: 13.2 % (ref 11.0–15.0)
Total Lymphocyte: 28.5 %
WBC: 9.4 10*3/uL (ref 3.8–10.8)

## 2019-11-28 LAB — COMPLETE METABOLIC PANEL WITH GFR
AG Ratio: 1.9 (calc) (ref 1.0–2.5)
ALT: 23 U/L (ref 6–29)
AST: 18 U/L (ref 10–35)
Albumin: 4.5 g/dL (ref 3.6–5.1)
Alkaline phosphatase (APISO): 91 U/L (ref 37–153)
BUN: 12 mg/dL (ref 7–25)
CO2: 27 mmol/L (ref 20–32)
Calcium: 10.4 mg/dL (ref 8.6–10.4)
Chloride: 103 mmol/L (ref 98–110)
Creat: 0.85 mg/dL (ref 0.50–0.99)
GFR, Est African American: 82 mL/min/{1.73_m2} (ref >=60)
GFR, Est Non African American: 71 mL/min/{1.73_m2} (ref >=60)
Globulin: 2.4 g/dL (calc) (ref 1.9–3.7)
Glucose, Bld: 91 mg/dL (ref 65–99)
Potassium: 4.3 mmol/L (ref 3.5–5.3)
Sodium: 140 mmol/L (ref 135–146)
Total Bilirubin: 0.5 mg/dL (ref 0.2–1.2)
Total Protein: 6.9 g/dL (ref 6.1–8.1)

## 2019-11-28 LAB — TSH: TSH: 2.84 mIU/L (ref 0.40–4.50)

## 2019-11-28 LAB — B. BURGDORFI ANTIBODIES: B burgdorferi Ab IgG+IgM: <0.9 index

## 2019-11-28 LAB — T4, FREE: Free T4: 1.3 ng/dL (ref 0.8–1.8)

## 2019-11-28 LAB — CORTISOL: Cortisol, Plasma: 4.2 ug/dL

## 2019-11-28 LAB — VITAMIN B12: Vitamin B-12: >2000 pg/mL — ABNORMAL HIGH (ref 200–1100)

## 2019-11-28 LAB — ACTH: C206 ACTH: 12 pg/mL (ref 6–50)

## 2019-11-28 MED ORDER — LEVOTHYROXINE SODIUM 125 MCG PO TABS: ORAL_TABLET | ORAL | 1 refills | Status: DC

## 2019-12-02 DIAGNOSIS — Z86010 Personal history of colonic polyps: Secondary | ICD-10-CM | POA: Diagnosis not present

## 2019-12-02 DIAGNOSIS — K5730 Diverticulosis of large intestine without perforation or abscess without bleeding: Secondary | ICD-10-CM | POA: Diagnosis not present

## 2019-12-02 LAB — HM COLONOSCOPY

## 2019-12-09 ENCOUNTER — Other Ambulatory Visit: Payer: Self-pay | Admitting: Adult Health Nurse Practitioner

## 2019-12-09 DIAGNOSIS — E039 Hypothyroidism, unspecified: Secondary | ICD-10-CM

## 2019-12-09 DIAGNOSIS — F419 Anxiety disorder, unspecified: Secondary | ICD-10-CM

## 2019-12-10 ENCOUNTER — Other Ambulatory Visit: Payer: Self-pay | Admitting: Adult Health Nurse Practitioner

## 2019-12-10 DIAGNOSIS — F988 Other specified behavioral and emotional disorders with onset usually occurring in childhood and adolescence: Secondary | ICD-10-CM

## 2019-12-10 MED ORDER — AMPHETAMINE-DEXTROAMPHETAMINE 20 MG PO TABS: ORAL_TABLET | ORAL | 0 refills | Status: DC

## 2019-12-13 ENCOUNTER — Other Ambulatory Visit: Payer: Medicare Other

## 2019-12-13 ENCOUNTER — Other Ambulatory Visit: Payer: Self-pay

## 2019-12-13 DIAGNOSIS — E039 Hypothyroidism, unspecified: Secondary | ICD-10-CM | POA: Diagnosis not present

## 2019-12-13 DIAGNOSIS — F419 Anxiety disorder, unspecified: Secondary | ICD-10-CM | POA: Diagnosis not present

## 2019-12-19 LAB — THYROID STIMULATING IMMUNOGLOBULIN: TSI: <89 % baseline (ref <140)

## 2019-12-19 LAB — THYROID PEROXIDASE ANTIBODY: Thyroperoxidase Ab SerPl-aCnc: 5 IU/mL (ref <9)

## 2019-12-19 LAB — THYROGLOBULIN ANTIBODY: Thyroglobulin Ab: <1 IU/mL (ref <=1)

## 2019-12-24 ENCOUNTER — Encounter: Payer: Self-pay | Admitting: Internal Medicine

## 2019-12-30 ENCOUNTER — Other Ambulatory Visit: Payer: Self-pay

## 2019-12-30 DIAGNOSIS — N952 Postmenopausal atrophic vaginitis: Secondary | ICD-10-CM

## 2019-12-30 MED ORDER — PREMARIN 0.625 MG/GM VA CREA: 1.0000 | TOPICAL_CREAM | Freq: Every day | VAGINAL | 2 refills | Status: DC

## 2020-01-08 ENCOUNTER — Other Ambulatory Visit: Payer: Self-pay

## 2020-01-08 MED ORDER — BUSPIRONE HCL 5 MG PO TABS: 5.0000 mg | ORAL_TABLET | Freq: Three times a day (TID) | ORAL | 0 refills | Status: DC | PRN

## 2020-01-10 DIAGNOSIS — S90222A Contusion of left lesser toe(s) with damage to nail, initial encounter: Secondary | ICD-10-CM | POA: Diagnosis not present

## 2020-01-10 DIAGNOSIS — M79675 Pain in left toe(s): Secondary | ICD-10-CM | POA: Diagnosis not present

## 2020-01-10 DIAGNOSIS — L03032 Cellulitis of left toe: Secondary | ICD-10-CM | POA: Diagnosis not present

## 2020-01-14 NOTE — Progress Notes (Signed)
Assessment and Plan:  Diana Wolfe was seen today for acute visit.  Diagnoses and all orders for this visit:  Hypothyroidism, unspecified type continue medications the same pending lab results reminded to take on an empty stomach 30-4mins before food.  check TSH level -     TSH  Lump of right thigh Lipoma vs other soft tissue mass; [place] ordered to further characterize Follow up and treatment pending results  -     [place] RT LOWER EXTREM LTD SOFT TISSUE NON VASCULAR; Future  Further disposition pending results of labs. Discussed med's effects and SE's.   Over 20 minutes of exam, counseling, chart review, and critical decision making was performed.   Future Appointments  Date Time Provider Department Center  02/04/2020 10:00 AM McClanahan, Bella Kennedy, NP [facility] None    ------------------------------------------------------------------------------------------------------------------   HPI BP 126/72   Pulse 66   Temp 97.7 F (36.5 C)   Wt 158 lb (71.7 kg)   BMI 28.90 kg/m   69 y.o.female presents for evlauation of tender lump in right thigh.   She reports started noting around 1 month ago she began to experience pressure and stabbing in lateral upper R thigh area that is persistent, can feel a deep lump. Describes as stabbing/sharp if sitting on hard surface, may have mild shooting if very hard surface. Can't tell if it's changing, hasn't tried anything for this. Denies fever/chills.   She is on thyroid medication. Her medication was changed last visit from brand name to generic, currently taking 125 mcg 1/2 tab every other day  Lab Results  Component Value Date   TSH 2.84 11/26/2019     Past Medical History:  Diagnosis Date  . ADD (attention deficit disorder)   . Anxiety   . Depression   . GERD (gastroesophageal reflux disease)   . Hyperlipidemia   . Hypertension   . Hypothyroid   . PONV (postoperative nausea and vomiting)   . Vitamin D deficiency      Allergies   Allergen Reactions  . Levofloxacin     Neck sweling/difficult breathing  . Cefprozil   . Codeine     REACTION: vomiting  . Hydrocodone-Acetaminophen   . Morphine   . Pravastatin Swelling  . Lexapro [Escitalopram Oxalate] Other (See Comments)    Tapping/abnormal movements    Current Outpatient Medications on File Prior to Visit  Medication Sig  . ALPRAZolam (XANAX) 1 MG tablet TAKE 1/2 TO 1 TABLET BY MOUTH AS NEEDED FOR SLEEP/ ANXIETY  . amphetamine-dextroamphetamine (ADDERALL) 20 MG tablet Take 1/2 to 1 tablet daily if needed for ADD  . busPIRone (BUSPAR) 5 MG tablet Take 1 tablet (5 mg total) by mouth 3 (three) times daily as needed (anxiety).  . conjugated estrogens (PREMARIN) vaginal cream Place 1 Applicatorful vaginally daily.  . cyanocobalamin (,VITAMIN B-12,) 1000 MCG/ML injection Inject 1 ml (1000 mcg) into the Skin Monthly for Vitamin B12 Deficiency  . cyclobenzaprine (FLEXERIL) 5 MG tablet Take 1 tablet (5 mg total) by mouth 3 (three) times daily as needed for muscle spasms.  Marland Kitchen EPINEPHrine 0.15 MG/0.15ML IJ injection Inject 0.15 mLs (0.15 mg total) into the muscle as needed for anaphylaxis.  . Estradiol (IMVEXXY MAINTENANCE PACK) 4 MCG INST Place 4 mcg vaginally every 3 (three) days.  Marland Kitchen ketoconazole (NIZORAL) 2 % cream APPLY TO AFFECTED AREA(S) TWO TIMES A DAY  . levothyroxine (SYNTHROID) 125 MCG tablet TAKE 1 TABLET DAILY ON AN EMPTY STOMACH WITH ONLY WATER FOR 30 MINUTES & NO ANTACID MEDS, CALCIUM  OR MAGNESIUM FOR 4 HOURS & AVOID BIOTIN- generic is okay  . lidocaine (LIDODERM) 5 % Place 1 patch onto the skin daily. Remove & Discard patch within 12 hours or as directed by MD  . meclizine (ANTIVERT) 25 MG tablet 1/2-1 pill up to 3 times daily for motion sickness/dizziness (Patient taking differently: as needed. 1/2-1 pill up to 3 times daily for motion sickness/dizziness)  . meloxicam (MOBIC) 15 MG tablet TAKE 1/2 TO 1 TABLET DAILY WITH FOOD FOR PAIN & INFLAMMATION  . mupirocin  cream (BACTROBAN) 2 % Apply to affected area 3 times daily  . omeprazole (PRILOSEC) 40 MG capsule Take 1 capsule Daily to Prevent Indigestion &  Heartburn  . sertraline (ZOLOFT) 50 MG tablet Take 1 tablet (50 mg total) by mouth daily.  Marland Kitchen sulfamethoxazole-trimethoprim (BACTRIM DS) 800-160 MG tablet   . SYRINGE/NEEDLE, DISP, 1 ML (B-D SYRINGE/NEEDLE 1CC/25GX5/8) 25G X 5/8" 1 ML MISC 1 SQ injection once weekly  . triamcinolone ointment (KENALOG) 0.1 % Apply 1 application topically 2 (two) times daily.  . vitamin C (ASCORBIC ACID) 500 MG tablet Take 1,200 mg by mouth daily.  . Vitamin D, Ergocalciferol, (DRISDOL) 1.25 MG (50000 UNIT) CAPS capsule Take 1 capsule 3 x /week on Mon Wed & Fri for Severe Vitamin D Deficiency   No current facility-administered medications on file prior to visit.    ROS: all negative except above.   Physical Exam:  BP 126/72   Pulse 66   Temp 97.7 F (36.5 C)   Wt 158 lb (71.7 kg)   BMI 28.90 kg/m   General Appearance: Well nourished, in no apparent distress. Eyes: PERRLA, conjunctiva no swelling or erythema ENT/Mouth: mask in place; Hearing normal.  Neck: Supple, thyroid normal.  Respiratory: Respiratory effort normal, BS equal bilaterally without rales, rhonchi, wheezing or stridor.  Cardio: RRR with no MRGs. Brisk peripheral pulses without edema.  Abdomen: Soft, + BS.  Non tender, no guarding, rebound, hernias, masses. Lymphatics: Non tender without lymphadenopathy.  Musculoskeletal: Full ROM, 5/5 strength, normal gait. She has deep tender soft lump to right posterior/lateral thigh, approx 15 cm inferior to greater trochanter, 3 x 2 cm or so, mobile, irregular  Skin: Warm, dry without rashes, lesions, ecchymosis.  Neuro: Normal muscle tone,  Sensation intact.  Psych: Awake and oriented X 3, normal affect, Insight and Judgment appropriate.     Dan Maker, NP 9:18 AM [facility]

## 2020-01-15 ENCOUNTER — Other Ambulatory Visit: Payer: Self-pay

## 2020-01-15 ENCOUNTER — Other Ambulatory Visit: Payer: Self-pay | Admitting: Adult Health

## 2020-01-15 ENCOUNTER — Ambulatory Visit (INDEPENDENT_AMBULATORY_CARE_PROVIDER_SITE_OTHER): Payer: Medicare Other | Admitting: Adult Health

## 2020-01-15 ENCOUNTER — Encounter: Payer: Self-pay | Admitting: Adult Health

## 2020-01-15 VITALS — BP 126/72 | HR 66 | Temp 97.7°F | Wt 158.0 lb

## 2020-01-15 DIAGNOSIS — E039 Hypothyroidism, unspecified: Secondary | ICD-10-CM | POA: Diagnosis not present

## 2020-01-15 DIAGNOSIS — R2241 Localized swelling, mass and lump, right lower limb: Secondary | ICD-10-CM | POA: Diagnosis not present

## 2020-01-15 DIAGNOSIS — F419 Anxiety disorder, unspecified: Secondary | ICD-10-CM

## 2020-01-15 MED ORDER — ALPRAZOLAM 1 MG PO TABS: ORAL_TABLET | ORAL | 0 refills | Status: DC

## 2020-01-15 NOTE — Progress Notes (Signed)
Future Appointments  Date Time Provider Department Center  01/30/2020  1:45 PM [facility] [place] 1 [facility]  03/24/2020  3:00 PM McClanahan, Bella Kennedy, NP [facility] None   PDMP reveiwed for xanax refill request.

## 2020-01-15 NOTE — Patient Instructions (Signed)
YOU CAN CALL TO MAKE AN ULTRASOUND.. ? ?I have put in an order for an ultrasound for you to have ?You can set them up at your convenience by calling this number  ?[phone] ?You will likely have the ultrasound at [address] ? ?If you have any issues call our office and we will set this up for you.   ?

## 2020-01-16 LAB — TSH: TSH: 0.36 mIU/L — ABNORMAL LOW (ref 0.40–4.50)

## 2020-01-30 ENCOUNTER — Other Ambulatory Visit: Payer: Self-pay

## 2020-01-30 ENCOUNTER — Ambulatory Visit
Admission: RE | Admit: 2020-01-30 | Discharge: 2020-01-30 | Disposition: A | Discharge status: Home / Self Care | Payer: Medicare Other | Source: Ambulatory Visit | Attending: Adult Health | Admitting: Adult Health

## 2020-01-30 DIAGNOSIS — Z23 Encounter for immunization: Secondary | ICD-10-CM | POA: Diagnosis not present

## 2020-01-30 DIAGNOSIS — R2241 Localized swelling, mass and lump, right lower limb: Secondary | ICD-10-CM | POA: Diagnosis not present

## 2020-02-03 ENCOUNTER — Other Ambulatory Visit: Payer: Self-pay | Admitting: Adult Health

## 2020-02-03 ENCOUNTER — Telehealth: Payer: Self-pay

## 2020-02-03 DIAGNOSIS — F988 Other specified behavioral and emotional disorders with onset usually occurring in childhood and adolescence: Secondary | ICD-10-CM

## 2020-02-03 MED ORDER — AMPHETAMINE-DEXTROAMPHETAMINE 20 MG PO TABS: ORAL_TABLET | ORAL | 0 refills | Status: DC

## 2020-02-03 NOTE — Telephone Encounter (Signed)
Refill request for Adderall, 20mg 

## 2020-02-03 NOTE — Progress Notes (Signed)
Future Appointments  Date Time Provider Department Center  03/24/2020  3:00 PM Elder Negus, NP [facility] None   PDMP reviewed for adderall refill request.

## 2020-02-04 ENCOUNTER — Encounter: Payer: Medicare Other | Admitting: Adult Health Nurse Practitioner

## 2020-02-10 ENCOUNTER — Other Ambulatory Visit: Payer: Self-pay | Admitting: Adult Health

## 2020-02-10 DIAGNOSIS — R2241 Localized swelling, mass and lump, right lower limb: Secondary | ICD-10-CM

## 2020-02-11 ENCOUNTER — Other Ambulatory Visit: Payer: Self-pay | Admitting: Adult Health

## 2020-02-11 ENCOUNTER — Encounter: Payer: Self-pay | Admitting: Adult Health

## 2020-02-11 DIAGNOSIS — R2241 Localized swelling, mass and lump, right lower limb: Secondary | ICD-10-CM

## 2020-02-11 NOTE — Addendum Note (Signed)
Addended by: Dan Maker on: 02/11/2020 05:41 PM   Modules accepted: Orders

## 2020-02-11 NOTE — Addendum Note (Signed)
Addended by: Dan Maker on: 02/11/2020 01:06 PM   Modules accepted: Orders

## 2020-02-12 ENCOUNTER — Other Ambulatory Visit: Payer: Self-pay | Admitting: Adult Health Nurse Practitioner

## 2020-03-07 ENCOUNTER — Other Ambulatory Visit: Payer: Self-pay

## 2020-03-07 ENCOUNTER — Other Ambulatory Visit: Payer: Self-pay | Admitting: Internal Medicine

## 2020-03-07 ENCOUNTER — Ambulatory Visit
Admission: RE | Admit: 2020-03-07 | Discharge: 2020-03-07 | Disposition: A | Discharge status: Home / Self Care | Payer: Medicare Other | Source: Ambulatory Visit | Attending: Adult Health | Admitting: Adult Health

## 2020-03-07 DIAGNOSIS — R2241 Localized swelling, mass and lump, right lower limb: Secondary | ICD-10-CM | POA: Diagnosis not present

## 2020-03-07 DIAGNOSIS — K219 Gastro-esophageal reflux disease without esophagitis: Secondary | ICD-10-CM

## 2020-03-07 DIAGNOSIS — R21 Rash and other nonspecific skin eruption: Secondary | ICD-10-CM

## 2020-03-07 MED ORDER — GADOBENATE DIMEGLUMINE 529 MG/ML IV SOLN
15.0000 mL | Freq: Once | INTRAVENOUS | Status: AC | PRN
  Administered 2020-03-07: 15 mL via INTRAVENOUS

## 2020-03-07 MED ORDER — OMEPRAZOLE 40 MG PO CPDR: DELAYED_RELEASE_CAPSULE | ORAL | 0 refills | Status: DC

## 2020-03-11 IMAGING — MG DIGITAL SCREENING BILATERAL MAMMOGRAM WITH CAD
4 series · 4 of 4 positions shown · non-contrast
Comparison: Previous exam(s).

CLINICAL DATA: Screening.

EXAM:
DIGITAL SCREENING BILATERAL MAMMOGRAM WITH CAD

[R CC]
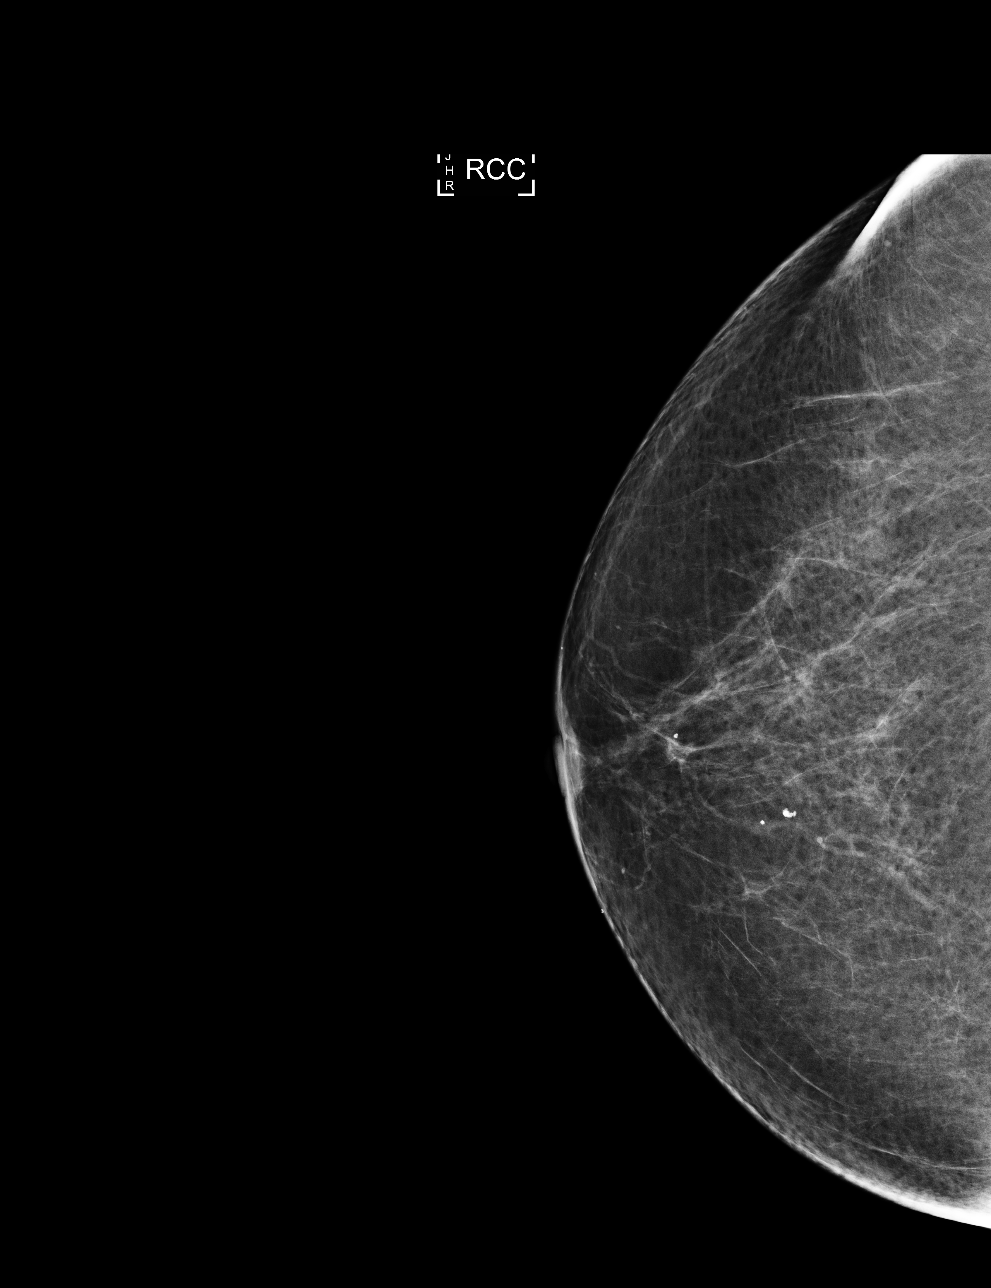

[L MLO]
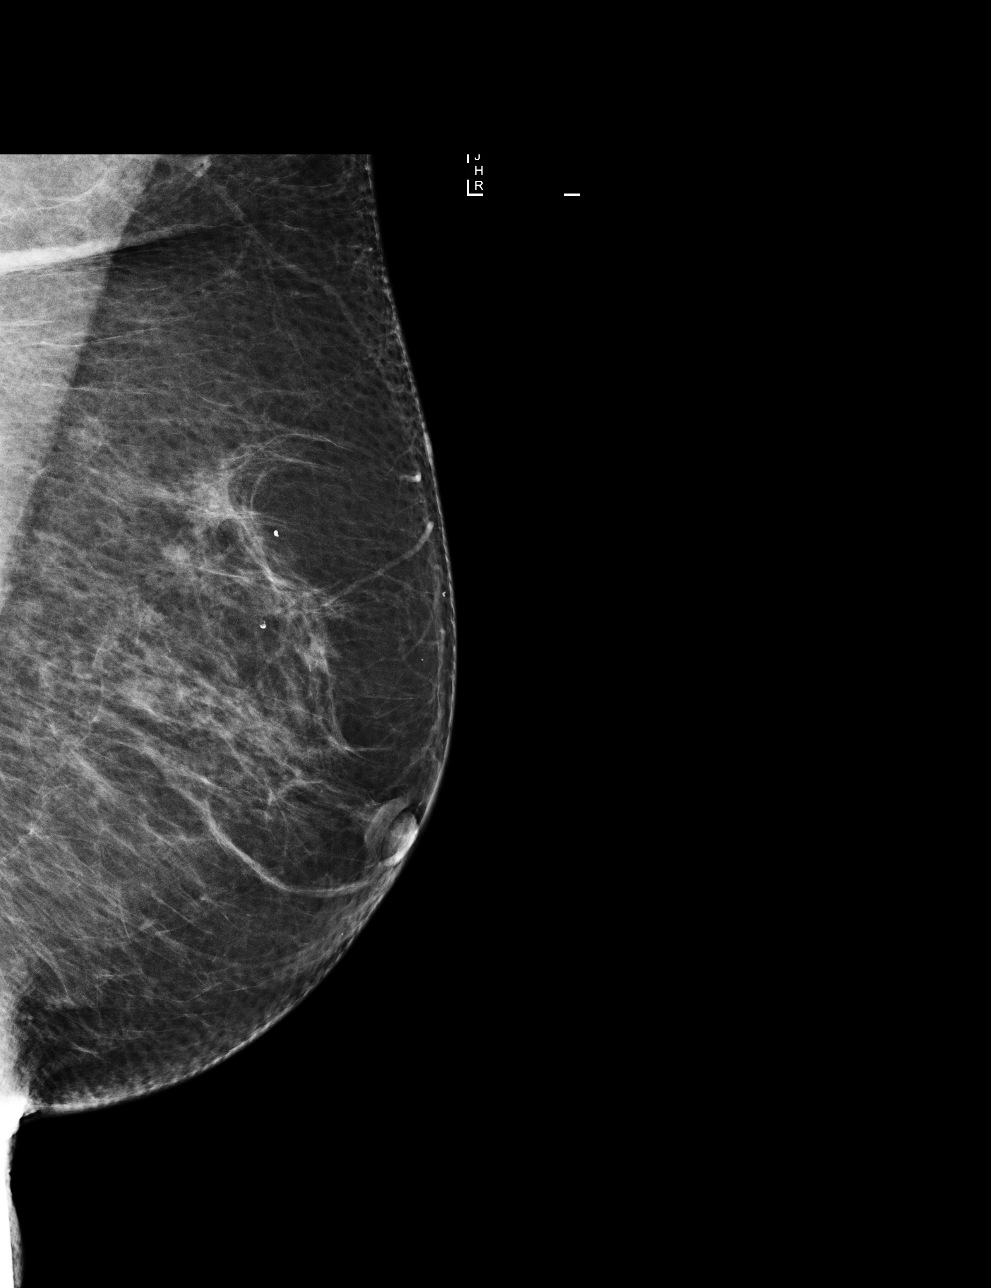

[R MLO]
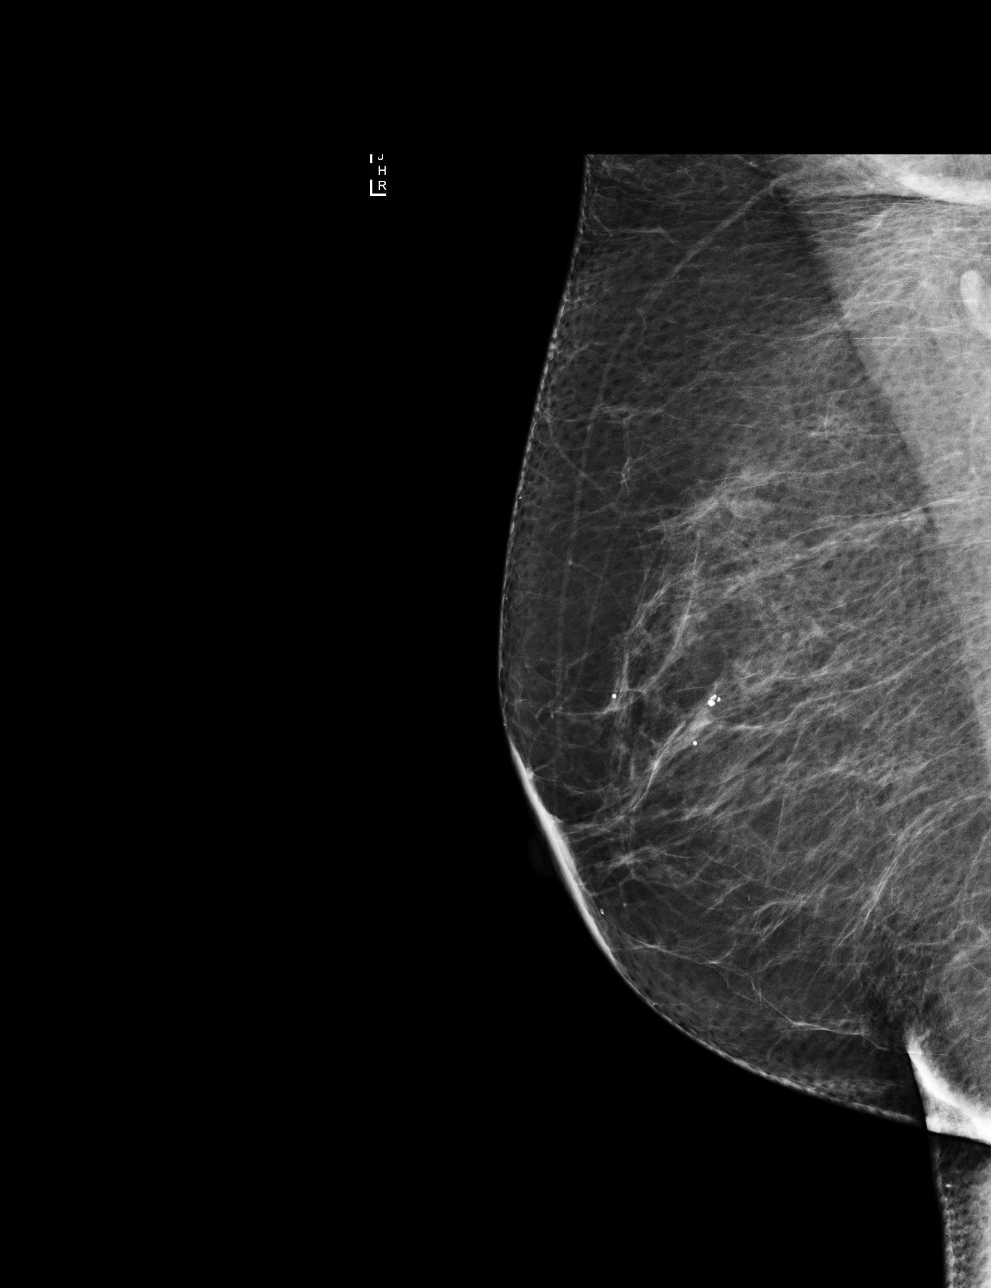

[L CC]
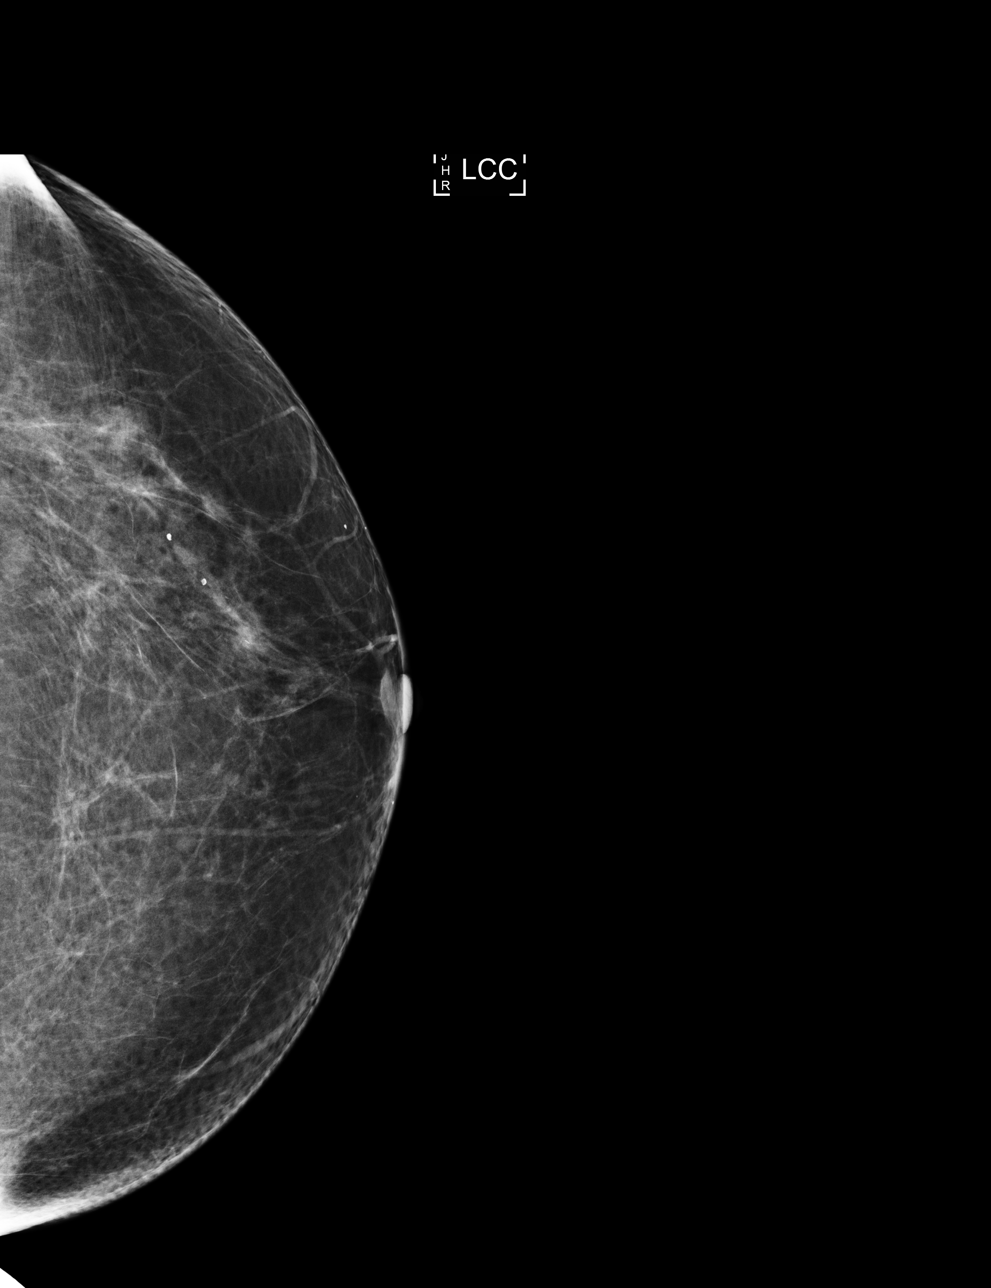

[4 of 4 positions shown; findings below may reference images not displayed]

ACR Breast Density Category b: There are scattered areas of
fibroglandular density.
FINDINGS: There are no findings suspicious for malignancy. Images were
processed with CAD.
IMPRESSION: No mammographic evidence of malignancy. A result letter of this
screening mammogram will be mailed directly to the patient.

RECOMMENDATION:
Screening mammogram in one year. (Code:AS-G-LCT)

BI-RADS CATEGORY  1: Negative.

## 2020-03-24 ENCOUNTER — Encounter: Payer: Self-pay | Admitting: Adult Health Nurse Practitioner

## 2020-03-24 ENCOUNTER — Ambulatory Visit (INDEPENDENT_AMBULATORY_CARE_PROVIDER_SITE_OTHER): Payer: Medicare Other | Admitting: Adult Health Nurse Practitioner

## 2020-03-24 ENCOUNTER — Other Ambulatory Visit: Payer: Self-pay

## 2020-03-24 VITALS — BP 126/80 | HR 91 | Temp 97.6°F | Ht 61.5 in | Wt 159.0 lb

## 2020-03-24 DIAGNOSIS — I10 Essential (primary) hypertension: Secondary | ICD-10-CM

## 2020-03-24 DIAGNOSIS — E538 Deficiency of other specified B group vitamins: Secondary | ICD-10-CM | POA: Diagnosis not present

## 2020-03-24 DIAGNOSIS — R7309 Other abnormal glucose: Secondary | ICD-10-CM | POA: Diagnosis not present

## 2020-03-24 DIAGNOSIS — Z0001 Encounter for general adult medical examination with abnormal findings: Secondary | ICD-10-CM

## 2020-03-24 DIAGNOSIS — G4733 Obstructive sleep apnea (adult) (pediatric): Secondary | ICD-10-CM

## 2020-03-24 DIAGNOSIS — E039 Hypothyroidism, unspecified: Secondary | ICD-10-CM | POA: Diagnosis not present

## 2020-03-24 DIAGNOSIS — E785 Hyperlipidemia, unspecified: Secondary | ICD-10-CM

## 2020-03-24 DIAGNOSIS — D509 Iron deficiency anemia, unspecified: Secondary | ICD-10-CM

## 2020-03-24 DIAGNOSIS — F988 Other specified behavioral and emotional disorders with onset usually occurring in childhood and adolescence: Secondary | ICD-10-CM

## 2020-03-24 DIAGNOSIS — F3342 Major depressive disorder, recurrent, in full remission: Secondary | ICD-10-CM

## 2020-03-24 DIAGNOSIS — E559 Vitamin D deficiency, unspecified: Secondary | ICD-10-CM | POA: Diagnosis not present

## 2020-03-24 DIAGNOSIS — Z79899 Other long term (current) drug therapy: Secondary | ICD-10-CM | POA: Diagnosis not present

## 2020-03-24 DIAGNOSIS — R350 Frequency of micturition: Secondary | ICD-10-CM | POA: Diagnosis not present

## 2020-03-24 DIAGNOSIS — R300 Dysuria: Secondary | ICD-10-CM

## 2020-03-24 DIAGNOSIS — Z136 Encounter for screening for cardiovascular disorders: Secondary | ICD-10-CM

## 2020-03-24 DIAGNOSIS — Z6829 Body mass index (BMI) 29.0-29.9, adult: Secondary | ICD-10-CM

## 2020-03-24 NOTE — Patient Instructions (Addendum)
   [facility] [address] [place], [place], [place] [address]  ~29.3 mi [phone]  Contact for an appointment.  Please let us know if  We can assist you with a referral.   GENERAL HEALTH GOALS  Know what a healthy weight is for you (roughly BMI <25) and aim to maintain this  Aim for 7+ servings of fruits and vegetables daily  70-80+ fluid ounces of water or unsweet tea for healthy kidneys  Limit to max 1 drink of alcohol per day; avoid smoking/tobacco  Limit animal fats in diet for cholesterol and heart health - choose grass fed whenever available  Avoid highly processed foods, and foods high in saturated/trans fats  Aim for low stress - take time to unwind and care for your mental health  Aim for 150 min of moderate intensity exercise weekly for heart health, and weights twice weekly for bone health  Aim for 7-9 hours of sleep daily

## 2020-03-24 NOTE — Progress Notes (Signed)
COMPLETE PHYSICAL  Assessment:    Encounter for general adult medical examination with abnormal findings Yearly  Abnormal glucose -     Hemoglobin A1c ([facility])  Obstructive sleep apnea Given numbers for dentist that do mouth piece  Iron deficiency anemia, unspecified iron deficiency anemia type -     Iron,Total/Total Iron Binding Cap -     Ferritin  Attention deficit disorder, unspecified hyperactivity presence Continue medication ? Would benefit from new mouth piece for OSA  Vitamin D deficiency -     Vitamin D (25 hydroxy)  B12 deficiency -     Vitamin B12  BMI 29.0-29.9,adult  Overweight  -  discussion about weight loss, diet, and exercise -recommended diet heavy in fruits and veggies and low in animal meats, cheeses, and dairy products  Essential hypertension - continue medications, DASH diet, exercise and monitor at home. Call if greater than 130/80.  -     CBC with Differential/Platelet -     BASIC METABOLIC PANEL WITH GFR -     Hepatic function panel -     TSH  Hypothyroidism, unspecified type Hypothyroidism-check TSH level, continue medications the same, reminded to take on an empty stomach 30-31mins before food.  -     TSH  PULMONARY EMBOLISM, HX OF Monitor, was provoked after breaking leg  Hyperlipidemia, unspecified hyperlipidemia type -continue medications, check lipids, decrease fatty foods, increase activity.   Recurrent major depressive disorder, in full remission (HCC) -  stress management techniques discussed, increase water, good sleep hygiene discussed, increase exercise, and increase veggies.  Not on any medications at this time.   Medication management -     Magnesium  Over 40 minutes of face to face interview, exam, counseling, chart review and critical decision making was performed Future Appointments  Date Time Provider Department Center  03/24/2021  3:00 PM Elder Negus, NP [facility] None    Subjective:  Diana Wolfe  is a 70 y.o. right handed female female who presents for complete physical.  She reports she has had an increase in frequency and urgency in urination over the course of the past 5 days.  She was seen 03/16 for neck/trap pain, worse with her arms above her head, has see chiropractor 3 x and states she is on muscle relaxer at night.    Her blood pressure has been controlled at home, today their BP is BP: 126/80 She does not workout, she is active with her dog. She denies chest pain, shortness of breath, dizziness.  BMI is Body mass index is 29.56 kg/m., she is working on diet and exercise. Wt Readings from Last 3 Encounters:  03/24/20 159 lb (72.1 kg)  01/15/20 158 lb (71.7 kg)  11/26/19 160 lb 12.8 oz (72.9 kg)    She is not on cholesterol medication and denies myalgias. Her cholesterol is at goal. The cholesterol last visit was:   Lab Results  Component Value Date   CHOL 209 (H) 05/17/2019   HDL 48 (L) 05/17/2019   LDLCALC 126 (H) 05/17/2019   LDLDIRECT 138.2 04/25/2008   TRIG 209 (H) 05/17/2019   CHOLHDL 4.4 05/17/2019   She is on the adderall, 1/2 tablet 1-2 x a day mainly when she drives.  She has chronic joint pain bilateral feet and left knee, on meloxicam 2 days a week. Takes prilosec as needed for GERD.     Last A1C in the office was:  Lab Results  Component Value Date   HGBA1C 5.6 05/17/2019  Last GFR: Lab Results  Component Value Date   GFRNONAA 71 11/26/2019   She is on thyroid medication. Her medication was not changed last visit.   Lab Results  Component Value Date   TSH 0.36 (L) 01/15/2020  .  Patient is on Vitamin D supplement.   Lab Results  Component Value Date   VD25OH 96 05/17/2019      Medication Review:  Current Outpatient Medications (Endocrine & Metabolic):  .  levothyroxine (SYNTHROID) 125 MCG tablet, TAKE 1 TABLET DAILY ON AN EMPTY STOMACH WITH ONLY WATER FOR 30 MINUTES & NO ANTACID MEDS, CALCIUM OR MAGNESIUM FOR 4 HOURS & AVOID BIOTIN-  generic is okay  Current Outpatient Medications (Cardiovascular):  Marland Kitchen  EPINEPHrine 0.15 MG/0.15ML IJ injection, Inject 0.15 mLs (0.15 mg total) into the muscle as needed for anaphylaxis.   Current Outpatient Medications (Analgesics):  .  meloxicam (MOBIC) 15 MG tablet, TAKE 1/2 TO 1 TABLET DAILY WITH FOOD FOR PAIN & INFLAMMATION  Current Outpatient Medications (Hematological):  .  cyanocobalamin (,VITAMIN B-12,) 1000 MCG/ML injection, Inject 1 ml (1000 mcg) into the Skin Monthly for Vitamin B12 Deficiency  Current Outpatient Medications (Other):  Marland Kitchen  ALPRAZolam (XANAX) 1 MG tablet, TAKE 1/2 TO 1 TABLET BY MOUTH AS NEEDED FOR SLEEP/ ANXIETY. AVOID DAILY USE DUE TO ADDICTIVE POTENTIAL, LIMIT TO <5 DAYS PER WEEK. Marland Kitchen  amphetamine-dextroamphetamine (ADDERALL) 20 MG tablet, Take 1/2 to 1 tablet daily if needed for ADD .  busPIRone (BUSPAR) 5 MG tablet, TAKE ONE TABLET BY MOUTH THREE TIMES A DAY AS NEEDED FOR ANXIETY .  conjugated estrogens (PREMARIN) vaginal cream, Place 1 Applicatorful vaginally daily. .  cyclobenzaprine (FLEXERIL) 5 MG tablet, Take 1 tablet (5 mg total) by mouth 3 (three) times daily as needed for muscle spasms. .  Estradiol (IMVEXXY MAINTENANCE PACK) 4 MCG INST, Place 4 mcg vaginally every 3 (three) days. Marland Kitchen  ketoconazole (NIZORAL) 2 % cream, APPLY TO AFFECTED AREA(S) TWO TIMES A DAY .  lidocaine (LIDODERM) 5 %, Place 1 patch onto the skin daily. Remove & Discard patch within 12 hours or as directed by MD .  meclizine (ANTIVERT) 25 MG tablet, 1/2-1 pill up to 3 times daily for motion sickness/dizziness (Patient taking differently: as needed. 1/2-1 pill up to 3 times daily for motion sickness/dizziness) .  omeprazole (PRILOSEC) 40 MG capsule, Take 1 capsule Daily to Prevent Indigestion &  Heartburn .  sertraline (ZOLOFT) 50 MG tablet, Take 1 tablet (50 mg total) by mouth daily. .  SYRINGE/NEEDLE, DISP, 1 ML (B-D SYRINGE/NEEDLE 1CC/25GX5/8) 25G X 5/8" 1 ML MISC, 1 SQ injection once  weekly .  triamcinolone ointment (KENALOG) 0.1 %, Apply 1 application topically 2 (two) times daily. .  vitamin C (ASCORBIC ACID) 500 MG tablet, Take 1,200 mg by mouth daily. .  Vitamin D, Ergocalciferol, (DRISDOL) 1.25 MG (50000 UNIT) CAPS capsule, Take 1 capsule 3 x /week on Mon Wed & Fri for Severe Vitamin D Deficiency   Allergies  Allergen Reactions  . Levofloxacin     Neck sweling/difficult breathing  . Cefprozil   . Codeine     REACTION: vomiting  . Hydrocodone-Acetaminophen   . Morphine   . Pravastatin Swelling  . Lexapro [Escitalopram Oxalate] Other (See Comments)    Tapping/abnormal movements    Current Problems (verified) Patient Active Problem List   Diagnosis Date Noted  . B12 deficiency 01/31/2019  . Medication management 06/23/2014  . Abnormal glucose 03/27/2014  . Vaginal atrophy 03/27/2014  . Hyperlipidemia   .  Hypertension   . Anxiety   . GERD (gastroesophageal reflux disease)   . ADD (attention deficit disorder)   . Depression   . Vitamin D deficiency   . Hypothyroidism 04/25/2008  . Iron deficiency anemia 04/25/2008  . Obstructive sleep apnea 11/16/2007  . Allergic rhinitis 11/16/2007  . Headache 11/16/2007  . PULMONARY EMBOLISM, HX OF 11/16/2007    Screening Tests Immunization History  Administered Date(s) Administered  . Fluad Quad(high Dose 65+) 10/28/2018  . Influenza Whole 11/22/2007  . Influenza, High Dose Seasonal PF 11/10/2019  . Influenza-Unspecified 10/28/2016  . Pneumococcal Conjugate-13 05/02/2017  . Pneumococcal Polysaccharide-23 10/11/2018  . Td 04/25/2008, 05/16/2018  . Varicella Zoster Immune Globulin 12/31/2014   Preventative care: Last colonoscopy: 2015 Dr.Magod 10/21 EGD 2017 Last mammogram: 05/2019 Last pap smear/pelvic exam: 2014 Dr. Seymour Bars DEXA: 07/2018 CT AB 09/2017 US soft tissue head 2018 MRI lumbar 2010  Prior vaccinations: TD or Tdap: 2020  Influenza: 2020 Pneumococcal: 2020 Prevnar13:  2019 Shingles/Zostavax: 2016 SARS-COV2: Completed Booster 12/2019  Names of Other Physician/Practitioners you currently use: 1. [facility] here for primary care 2. [address], eye doctor, last visit Jan 2021 3. Dr. Richardine Service, dentist, last visit yearly 2021 Patient Care Team: Lucky Cowboy, MD as PCP - General (Internal Medicine)  SURGICAL HISTORY She  has a past surgical history that includes Foot surgery (Right); Cesarean section; Tonsillectomy (2002); Esophagogastroduodenoscopy (N/A, 09/02/2015); and IM nailing tibia (Left, 01/09/2001). FAMILY HISTORY Her family history includes Cancer (age of onset: 93) in her mother; Heart disease in her father; Hypertension in her father. SOCIAL HISTORY She  reports that she has never smoked. She has never used smokeless tobacco. She reports current alcohol use. She reports that she does not use drugs.   Review of Systems  Constitutional: Negative.   HENT: Negative for ear pain.   Eyes: Negative.   Respiratory: Negative.   Cardiovascular: Negative.   Gastrointestinal: Negative.   Genitourinary: Negative.   Musculoskeletal: Negative for back pain.  Skin: Negative.     Objective:     Today's Vitals   03/24/20 1514  BP: 126/80  Pulse: 91  Temp: 97.6 F (36.4 C)  SpO2: 96%  Weight: 159 lb (72.1 kg)  Height: 5' 1.5" (1.562 m)   Body mass index is 29.56 kg/m.  General appearance: alert, no distress, WD/WN, female HEENT: normocephalic, sclerae anicteric, TMs pearly on right ear, nares patent, no discharge or erythema, pharynx normal.  Oral cavity: MMM, no lesions, scalloped tongue Neck: supple, no lymphadenopathy, no thyromegaly, no masses Heart: RRR, normal S1, S2 prominent S2, no murmurs Lungs: CTA bilaterally, no wheezes, rhonchi, or rales Abdomen: +bs, soft, non tender, non distended, no masses, no hepatomegaly, no splenomegaly Musculoskeletal: nontender, no swelling,  Negative straight leg, + right SI pain.  Extremities: no edema, no cyanosis, no clubbing Pulses: 2+ symmetric, upper and lower extremities, normal cap refill Neurological: alert, oriented x 3, CN2-12 intact, strength normal upper extremities and lower extremities, sensation normal throughout, DTRs 2+ throughout, no cerebellar signs, gait normal Psychiatric: normal affect, behavior normal, pleasant     Elder Negus, NP   03/24/2020

## 2020-03-26 LAB — LIPID PANEL
Cholesterol: 195 mg/dL (ref <200)
HDL: 49 mg/dL — ABNORMAL LOW (ref >=50)
LDL Cholesterol (Calc): 105 mg/dL (calc) — ABNORMAL HIGH
Non-HDL Cholesterol (Calc): 146 mg/dL (calc) — ABNORMAL HIGH (ref <130)
Total CHOL/HDL Ratio: 4 (calc) (ref <5.0)
Triglycerides: 287 mg/dL — ABNORMAL HIGH (ref <150)

## 2020-03-26 LAB — CBC WITH DIFFERENTIAL/PLATELET
Absolute Monocytes: 636 cells/uL (ref 200–950)
Basophils Absolute: 52 cells/uL (ref 0–200)
Basophils Relative: 0.7 %
Eosinophils Absolute: 311 cells/uL (ref 15–500)
Eosinophils Relative: 4.2 %
HCT: 44.6 % (ref 35.0–45.0)
Hemoglobin: 15.1 g/dL (ref 11.7–15.5)
Lymphs Abs: 2205 cells/uL (ref 850–3900)
MCH: 29.1 pg (ref 27.0–33.0)
MCHC: 33.9 g/dL (ref 32.0–36.0)
MCV: 85.9 fL (ref 80.0–100.0)
MPV: 10.1 fL (ref 7.5–12.5)
Monocytes Relative: 8.6 %
Neutro Abs: 4196 cells/uL (ref 1500–7800)
Neutrophils Relative %: 56.7 %
Platelets: 337 10*3/uL (ref 140–400)
RBC: 5.19 10*6/uL — ABNORMAL HIGH (ref 3.80–5.10)
RDW: 12.6 % (ref 11.0–15.0)
Total Lymphocyte: 29.8 %
WBC: 7.4 10*3/uL (ref 3.8–10.8)

## 2020-03-26 LAB — URINALYSIS W MICROSCOPIC + REFLEX CULTURE
Bacteria, UA: NONE SEEN /HPF
Bilirubin Urine: NEGATIVE
Glucose, UA: NEGATIVE
Hgb urine dipstick: NEGATIVE
Hyaline Cast: NONE SEEN /LPF
Ketones, ur: NEGATIVE
Nitrites, Initial: NEGATIVE
Protein, ur: NEGATIVE
Specific Gravity, Urine: 1.012 (ref 1.001–1.03)
pH: 5.5 (ref 5.0–8.0)

## 2020-03-26 LAB — COMPLETE METABOLIC PANEL WITH GFR
AG Ratio: 1.8 (calc) (ref 1.0–2.5)
ALT: 23 U/L (ref 6–29)
AST: 22 U/L (ref 10–35)
Albumin: 4.2 g/dL (ref 3.6–5.1)
Alkaline phosphatase (APISO): 77 U/L (ref 37–153)
BUN: 11 mg/dL (ref 7–25)
CO2: 29 mmol/L (ref 20–32)
Calcium: 10.2 mg/dL (ref 8.6–10.4)
Chloride: 106 mmol/L (ref 98–110)
Creat: 0.76 mg/dL (ref 0.50–0.99)
GFR, Est African American: 94 mL/min/{1.73_m2} (ref >=60)
GFR, Est Non African American: 81 mL/min/{1.73_m2} (ref >=60)
Globulin: 2.3 g/dL (calc) (ref 1.9–3.7)
Glucose, Bld: 88 mg/dL (ref 65–99)
Potassium: 4.2 mmol/L (ref 3.5–5.3)
Sodium: 142 mmol/L (ref 135–146)
Total Bilirubin: 0.4 mg/dL (ref 0.2–1.2)
Total Protein: 6.5 g/dL (ref 6.1–8.1)

## 2020-03-26 LAB — IRON, TOTAL/TOTAL IRON BINDING CAP
%SAT: 20 % (calc) (ref 16–45)
Iron: 73 ug/dL (ref 45–160)
TIBC: 368 mcg/dL (calc) (ref 250–450)

## 2020-03-26 LAB — URINE CULTURE
MICRO NUMBER:: 11485266
SPECIMEN QUALITY:: ADEQUATE

## 2020-03-26 LAB — HEMOGLOBIN A1C
Hgb A1c MFr Bld: 5.7 % of total Hgb — ABNORMAL HIGH (ref <5.7)
Mean Plasma Glucose: 117 mg/dL
eAG (mmol/L): 6.5 mmol/L

## 2020-03-26 LAB — VITAMIN D 25 HYDROXY (VIT D DEFICIENCY, FRACTURES): Vit D, 25-Hydroxy: 50 ng/mL (ref 30–100)

## 2020-03-26 LAB — CULTURE INDICATED

## 2020-03-26 LAB — TSH: TSH: 0.02 mIU/L — ABNORMAL LOW (ref 0.40–4.50)

## 2020-03-26 LAB — VITAMIN B12: Vitamin B-12: >2000 pg/mL — ABNORMAL HIGH (ref 200–1100)

## 2020-03-29 MED ORDER — FLUCONAZOLE 150 MG PO TABS: ORAL_TABLET | ORAL | 0 refills | Status: DC

## 2020-03-29 MED ORDER — AMOXICILLIN-POT CLAVULANATE 875-125 MG PO TABS: 1.0000 | ORAL_TABLET | Freq: Two times a day (BID) | ORAL | 0 refills | Status: DC

## 2020-04-06 ENCOUNTER — Other Ambulatory Visit: Payer: Self-pay | Admitting: Adult Health Nurse Practitioner

## 2020-04-06 DIAGNOSIS — F988 Other specified behavioral and emotional disorders with onset usually occurring in childhood and adolescence: Secondary | ICD-10-CM

## 2020-04-06 MED ORDER — AMPHETAMINE-DEXTROAMPHETAMINE 20 MG PO TABS: ORAL_TABLET | ORAL | 0 refills | Status: DC

## 2020-04-16 ENCOUNTER — Other Ambulatory Visit: Payer: Self-pay

## 2020-04-16 ENCOUNTER — Ambulatory Visit (INDEPENDENT_AMBULATORY_CARE_PROVIDER_SITE_OTHER): Payer: Medicare Other | Admitting: Adult Health

## 2020-04-16 ENCOUNTER — Encounter: Payer: Self-pay | Admitting: Adult Health

## 2020-04-16 VITALS — BP 156/90 | HR 81 | Temp 97.5°F | Wt 156.0 lb

## 2020-04-16 DIAGNOSIS — H9202 Otalgia, left ear: Secondary | ICD-10-CM

## 2020-04-16 DIAGNOSIS — H6122 Impacted cerumen, left ear: Secondary | ICD-10-CM

## 2020-04-16 DIAGNOSIS — I10 Essential (primary) hypertension: Secondary | ICD-10-CM | POA: Diagnosis not present

## 2020-04-16 NOTE — Progress Notes (Signed)
Assessment and Plan:  Diana Wolfe was seen today for ear fullness.  Diagnoses and all orders for this visit:  Left ear pain Excessive cerumen in ear canal, left Onset while on augmentin; no evidence of infectious otitis media today Pain improved after cerumen irrigation;  use OTC drops/oil at home to prevent reoccurence However continued pressure, likely some element of otitis media/eustachian tube dysfunction -Allergy pill, flonase, autoinflation, complete augmentin Follow up if pain/pressure doesn't resolve or worsens after completion of abx  Primary hypertension Atypically elevated; she attributes to seeing stock market report today Also active pain Monitor blood pressure at home; call if consistently over 130/80 Given information about DASH diet.   Reminder to go to the ER if any CP, SOB, nausea, dizziness, severe HA, changes vision/speech, left arm numbness and tingling and jaw pain.   Further disposition pending results of labs. Discussed med's effects and SE's.   Over 30 minutes of exam, counseling, chart review, and critical decision making was performed.   Future Appointments  Date Time Provider Department Center  04/21/2020  3:00 PM Vivi Barrack, [place] [facility]  10/16/2020 10:30 AM Judd Gaudier, NP [facility] None  03/24/2021  3:00 PM McClanahan, Bella Kennedy, NP [facility] None    ------------------------------------------------------------------------------------------------------------------   HPI BP (!) 156/90   Pulse 81   Temp (!) 97.5 F (36.4 C)   Wt 156 lb (70.8 kg)   SpO2 98%   BMI 29.00 kg/m   70 y.o.female with hx of allergic rhinitis presents for evaluation of persistent left ear pain.   She reports left ear pain that began 1 week ago, was preceeded by a few days of mild congestion, URI sx that resolved quickly (neg covid 19 test); sx began while she reports both sharp pain and pressure, fullness, soreness, echo/muffled. Denies tinnitis. Also  incidentally has been on augmentin, on last 1-2 days, 10 day course was sent in for UTI identified at CPE earlier in feb.   She reports has been doing peroxide drops a few times per day, has been trying to irrigate due to sensation of full canal without much success.   She did take certirizine for a few days with URI sx but reports stopped. Hasn't tried nasal sprays or OTC analgesics.   Past Medical History:  Diagnosis Date  . ADD (attention deficit disorder)   . Anxiety   . Depression   . GERD (gastroesophageal reflux disease)   . Hyperlipidemia   . Hypertension   . Hypothyroid   . PONV (postoperative nausea and vomiting)   . Vitamin D deficiency      Allergies  Allergen Reactions  . Levofloxacin     Neck sweling/difficult breathing  . Cefprozil   . Codeine     REACTION: vomiting  . Hydrocodone-Acetaminophen   . Morphine   . Pravastatin Swelling  . Lexapro [Escitalopram Oxalate] Other (See Comments)    Tapping/abnormal movements    Current Outpatient Medications on File Prior to Visit  Medication Sig  . ALPRAZolam (XANAX) 1 MG tablet TAKE 1/2 TO 1 TABLET BY MOUTH AS NEEDED FOR SLEEP/ ANXIETY. AVOID DAILY USE DUE TO ADDICTIVE POTENTIAL, LIMIT TO <5 DAYS PER WEEK.  Marland Kitchen amoxicillin-clavulanate (AUGMENTIN) 875-125 MG tablet Take 1 tablet by mouth 2 (two) times daily. 10 days  . amphetamine-dextroamphetamine (ADDERALL) 20 MG tablet Take 1/2 to 1 tablet daily if needed for ADD  . busPIRone (BUSPAR) 5 MG tablet TAKE ONE TABLET BY MOUTH THREE TIMES A DAY AS NEEDED FOR ANXIETY  .  cyanocobalamin (,VITAMIN B-12,) 1000 MCG/ML injection Inject 1 ml (1000 mcg) into the Skin Monthly for Vitamin B12 Deficiency  . cyclobenzaprine (FLEXERIL) 5 MG tablet Take 1 tablet (5 mg total) by mouth 3 (three) times daily as needed for muscle spasms.  Marland Kitchen EPINEPHrine 0.15 MG/0.15ML IJ injection Inject 0.15 mLs (0.15 mg total) into the muscle as needed for anaphylaxis.  . Estradiol (IMVEXXY MAINTENANCE PACK)  4 MCG INST Place 4 mcg vaginally every 3 (three) days.  Marland Kitchen ketoconazole (NIZORAL) 2 % cream APPLY TO AFFECTED AREA(S) TWO TIMES A DAY  . levothyroxine (SYNTHROID) 125 MCG tablet TAKE 1 TABLET DAILY ON AN EMPTY STOMACH WITH ONLY WATER FOR 30 MINUTES & NO ANTACID MEDS, CALCIUM OR MAGNESIUM FOR 4 HOURS & AVOID BIOTIN- generic is okay  . meclizine (ANTIVERT) 25 MG tablet 1/2-1 pill up to 3 times daily for motion sickness/dizziness (Patient taking differently: as needed. 1/2-1 pill up to 3 times daily for motion sickness/dizziness)  . meloxicam (MOBIC) 15 MG tablet TAKE 1/2 TO 1 TABLET DAILY WITH FOOD FOR PAIN & INFLAMMATION  . omeprazole (PRILOSEC) 40 MG capsule Take 1 capsule Daily to Prevent Indigestion &  Heartburn  . SYRINGE/NEEDLE, DISP, 1 ML (B-D SYRINGE/NEEDLE 1CC/25GX5/8) 25G X 5/8" 1 ML MISC 1 SQ injection once weekly  . triamcinolone ointment (KENALOG) 0.1 % Apply 1 application topically 2 (two) times daily.  . vitamin C (ASCORBIC ACID) 500 MG tablet Take 1,200 mg by mouth daily.  . Vitamin D, Ergocalciferol, (DRISDOL) 1.25 MG (50000 UNIT) CAPS capsule Take 1 capsule 3 x /week on Mon Wed & Fri for Severe Vitamin D Deficiency  . conjugated estrogens (PREMARIN) vaginal cream Place 1 Applicatorful vaginally daily. (Patient not taking: Reported on 04/16/2020)  . fluconazole (DIFLUCAN) 150 MG tablet Take one tablet at onset of vaginal yeast symptoms and second tablet three days later for yeast. (Patient not taking: Reported on 04/16/2020)  . lidocaine (LIDODERM) 5 % Place 1 patch onto the skin daily. Remove & Discard patch within 12 hours or as directed by MD (Patient not taking: Reported on 04/16/2020)  . sertraline (ZOLOFT) 50 MG tablet Take 1 tablet (50 mg total) by mouth daily. (Patient not taking: Reported on 04/16/2020)   No current facility-administered medications on file prior to visit.    ROS: all negative except above.   Physical Exam:  BP (!) 156/90   Pulse 81   Temp (!) 97.5 F (36.4  C)   Wt 156 lb (70.8 kg)   SpO2 98%   BMI 29.00 kg/m   General Appearance: Well nourished, in no apparent distress. Eyes: PERRLA, EOMs, conjunctiva no swelling or erythema Sinuses: No Frontal/maxillary tenderness ENT/Mouth: R Ext aud canal clear, TMs without erythema, bulging. L canal initially obstructed by soft wax, after irrigation without significant erythema or maceration. TM with clear BLM mildly erythematous, no purulence, no bulging. No erythema, swelling, or exudate on post pharynx.  Tonsils not swollen or erythematous. Hearing normal.  Neck: Supple, thyroid normal.  Respiratory: Respiratory effort normal, BS equal bilaterally without rales, rhonchi, wheezing or stridor.  Cardio: RRR with no MRGs. Brisk peripheral pulses without edema.  Lymphatics: Non tender without lymphadenopathy.  Musculoskeletal: Mild left TMJ tenderness without crepitus, popping, clicking; neck musculature non-tender. normal gait.  Skin: Warm, dry without rashes, lesions, ecchymosis.  Neuro: Normal muscle tone Psych: Awake and oriented X 3, normal affect, Insight and Judgment appropriate.     Dan Maker, NP 12:17 PM [facility]

## 2020-04-16 NOTE — Patient Instructions (Addendum)
Goals    . Blood Pressure < 130/80      Please check blood pressure at home to make sure improving - today was 156/90 - see info attached below  Recommend getting on a daily allergy pill  Can add daily flonase 1-2 sprays in each nostril 1-2 times daily (antiinflammatory, helps with inflammation of the eustachian tubes as well) Can also do ibuprofen/aleve for inflammation to help the ear drain   hold nose while drinking water to autoinflate  Sucking on sugar free candies can also help relieve pressure   if drainage from ear, fever, chills, HA, nausea, call the office or go to the ER     Otitis Media, Adult  Otitis media is a condition in which the middle ear is red and swollen (inflamed) and full of fluid. The middle ear is the part of the ear that contains bones for hearing as well as air that helps send sounds to the brain. The condition usually goes away on its own. What are the causes? This condition is caused by a blockage in the eustachian tube. The eustachian tube connects the middle ear to the back of the nose. It normally allows air into the middle ear. The blockage is caused by fluid or swelling. Problems that can cause blockage include:  A cold or infection that affects the nose, mouth, or throat.  Allergies.  An irritant, such as tobacco smoke.  Adenoids that have become large. The adenoids are soft tissue located in the back of the throat, behind the nose and the roof of the mouth.  Growth or swelling in the upper part of the throat, just behind the nose (nasopharynx).  Damage to the ear caused by change in pressure. This is called barotrauma. What are the signs or symptoms? Symptoms of this condition include:  Ear pain.  Fever.  Problems with hearing.  Being tired.  Fluid leaking from the ear.  Ringing in the ear. How is this treated? This condition can go away on its own within 3-5 days. But if the condition is caused by bacteria or does not go away on  its own, or if it keeps coming back, your doctor may:  Give you antibiotic medicines.  Give you medicines for pain. Follow these instructions at home:  Take over-the-counter and prescription medicines only as told by your doctor.  If you were prescribed an antibiotic medicine, take it as told by your doctor. Do not stop taking the antibiotic even if you start to feel better.  Keep all follow-up visits as told by your doctor. This is important. Contact a doctor if:  You have bleeding from your nose.  There is a lump on your neck.  You are not feeling better in 5 days.  You feel worse instead of better. Get help right away if:  You have pain that is not helped with medicine.  You have swelling, redness, or pain around your ear.  You get a stiff neck.  You cannot move part of your face (paralysis).  You notice that the bone behind your ear hurts when you touch it.  You get a very bad headache. Summary  Otitis media means that the middle ear is red, swollen, and full of fluid.  This condition usually goes away on its own.  If the problem does not go away, treatment may be needed. You may be given medicines to treat the infection or to treat your pain.  If you were prescribed an antibiotic medicine,  take it as told by your doctor. Do not stop taking the antibiotic even if you start to feel better.  Keep all follow-up visits as told by your doctor. This is important. This information is not intended to replace advice given to you by your health care provider. Make sure you discuss any questions you have with your health care provider. Document Revised: 01/10/2019 Document Reviewed: 01/10/2019 Elsevier Patient Education  2021 [place].      Ear wax  Use a dropper or use a cap to put peroxide, olive oil,mineral oil or canola oil in the effected ear- 2-3 times a week. Let it soak for 20-30 min then you can take a shower or use a baby bulb with warm water to wash out  the ear wax.  Can buy debrox wax removal kit over the counter.  Do not use Qtips   HYPERTENSION INFORMATION  Monitor your blood pressure at home, please keep a record and bring that in with you to your next office visit.   Go to the ER if any CP, SOB, nausea, dizziness, severe HA, changes vision/speech  Testing/Procedures: HOW TO TAKE YOUR BLOOD PRESSURE:  Rest 5 minutes before taking your blood pressure.  Don't smoke or drink caffeinated beverages for at least 30 minutes before.  Take your blood pressure before (not after) you eat.  Sit comfortably with your back supported and both feet on the floor (don't cross your legs).  Elevate your arm to heart level on a table or a desk.  Use the proper sized cuff. It should fit smoothly and snugly around your bare upper arm. There should be enough room to slip a fingertip under the cuff. The bottom edge of the cuff should be 1 inch above the crease of the elbow.  Your most recent BP: BP: (!) 156/90   Take your medications faithfully as instructed. Maintain a healthy weight. Get at least 150 minutes of aerobic exercise per week. Minimize salt intake. Minimize alcohol intake  DASH Eating Plan DASH stands for "Dietary Approaches to Stop Hypertension." The DASH eating plan is a healthy eating plan that has been shown to reduce high blood pressure (hypertension). Additional health benefits may include reducing the risk of type 2 diabetes mellitus, heart disease, and stroke. The DASH eating plan may also help with weight loss. WHAT DO I NEED TO KNOW ABOUT THE DASH EATING PLAN? For the DASH eating plan, you will follow these general guidelines:  Choose foods with a percent daily value for sodium of less than 5% (as listed on the food label).  Use salt-free seasonings or herbs instead of table salt or sea salt.  Check with your health care provider or pharmacist before using salt substitutes.  Eat lower-sodium products, often labeled as  "lower sodium" or "no salt added."  Eat fresh foods.  Eat more vegetables, fruits, and low-fat dairy products.  Choose whole grains. Look for the word "whole" as the first word in the ingredient list.  Choose fish and skinless chicken or [place] more often than red meat. Limit fish, poultry, and meat to 6 oz (170 g) each day.  Limit sweets, desserts, sugars, and sugary drinks.  Choose heart-healthy fats.  Limit cheese to 1 oz (28 g) per day.  Eat more home-cooked food and less restaurant, buffet, and fast food.  Limit fried foods.  Cook foods using methods other than frying.  Limit canned vegetables. If you do use them, rinse them well to decrease the sodium.  When eating at a restaurant, ask that your food be prepared with less salt, or no salt if possible. WHAT FOODS CAN I EAT? Seek help from a dietitian for individual calorie needs. Grains Whole grain or whole wheat bread. Brown rice. Whole grain or whole wheat pasta. Quinoa, bulgur, and whole grain cereals. Low-sodium cereals. Corn or whole wheat flour tortillas. Whole grain cornbread. Whole grain crackers. Low-sodium crackers. Vegetables Fresh or frozen vegetables (raw, steamed, roasted, or grilled). Low-sodium or reduced-sodium tomato and vegetable juices. Low-sodium or reduced-sodium tomato sauce and paste. Low-sodium or reduced-sodium canned vegetables.  Fruits All fresh, canned (in natural juice), or frozen fruits. Meat and Other Protein Products Ground beef (85% or leaner), grass-fed beef, or beef trimmed of fat. Skinless chicken or [place]. Ground chicken or [place]. Pork trimmed of fat. All fish and seafood. Eggs. Dried beans, peas, or lentils. Unsalted nuts and seeds. Unsalted canned beans. Dairy Low-fat dairy products, such as skim or 1% milk, 2% or reduced-fat cheeses, low-fat ricotta or cottage cheese, or plain low-fat yogurt. Low-sodium or reduced-sodium cheeses. Fats and Oils Tub margarines without trans fats.  Light or reduced-fat mayonnaise and salad dressings (reduced sodium). Avocado. Safflower, olive, or canola oils. Natural peanut or almond butter. Other Unsalted popcorn and pretzels. The items listed above may not be a complete list of recommended foods or beverages. Contact your dietitian for more options. WHAT FOODS ARE NOT RECOMMENDED? Grains White bread. White pasta. White rice. Refined cornbread. Bagels and croissants. Crackers that contain trans fat. Vegetables Creamed or fried vegetables. Vegetables in a cheese sauce. Regular canned vegetables. Regular canned tomato sauce and paste. Regular tomato and vegetable juices. Fruits Dried fruits. Canned fruit in light or heavy syrup. Fruit juice. Meat and Other Protein Products Fatty cuts of meat. Ribs, chicken wings, bacon, sausage, bologna, salami, chitterlings, fatback, hot dogs, bratwurst, and packaged luncheon meats. Salted nuts and seeds. Canned beans with salt. Dairy Whole or 2% milk, cream, half-and-half, and cream cheese. Whole-fat or sweetened yogurt. Full-fat cheeses or blue cheese. Nondairy creamers and whipped toppings. Processed cheese, cheese spreads, or cheese curds. Condiments Onion and garlic salt, seasoned salt, table salt, and sea salt. Canned and packaged gravies. Worcestershire sauce. Tartar sauce. Barbecue sauce. Teriyaki sauce. Soy sauce, including reduced sodium. Steak sauce. Fish sauce. Oyster sauce. Cocktail sauce. Horseradish. Ketchup and mustard. Meat flavorings and tenderizers. Bouillon cubes. Hot sauce. Tabasco sauce. Marinades. Taco seasonings. Relishes. Fats and Oils Butter, stick margarine, lard, shortening, ghee, and bacon fat. Coconut, palm kernel, or palm oils. Regular salad dressings. Other Pickles and olives. Salted popcorn and pretzels. The items listed above may not be a complete list of foods and beverages to avoid. Contact your dietitian for more information. WHERE CAN I FIND MORE  INFORMATION? National Heart, Lung, and Blood Institute: travelstabloid.com Document Released: 01/27/2011 Document Revised: 06/24/2013 Document Reviewed: 12/12/2012 [facility] Patient Information 2015 [place], [place]. This information is not intended to replace advice given to you by your health care provider. Make sure you discuss any questions you have with your health care provider.

## 2020-04-21 ENCOUNTER — Ambulatory Visit (INDEPENDENT_AMBULATORY_CARE_PROVIDER_SITE_OTHER): Payer: Medicare Other | Admitting: Podiatry

## 2020-04-21 ENCOUNTER — Encounter: Payer: Self-pay | Admitting: Podiatry

## 2020-04-21 ENCOUNTER — Other Ambulatory Visit: Payer: Self-pay

## 2020-04-21 DIAGNOSIS — L603 Nail dystrophy: Secondary | ICD-10-CM | POA: Diagnosis not present

## 2020-04-21 DIAGNOSIS — L509 Urticaria, unspecified: Secondary | ICD-10-CM | POA: Insufficient documentation

## 2020-04-21 DIAGNOSIS — Z860100 Personal history of colon polyps, unspecified: Secondary | ICD-10-CM | POA: Insufficient documentation

## 2020-04-21 DIAGNOSIS — B351 Tinea unguium: Secondary | ICD-10-CM | POA: Diagnosis not present

## 2020-04-21 DIAGNOSIS — R1084 Generalized abdominal pain: Secondary | ICD-10-CM | POA: Insufficient documentation

## 2020-04-21 DIAGNOSIS — L608 Other nail disorders: Secondary | ICD-10-CM | POA: Diagnosis not present

## 2020-04-21 DIAGNOSIS — R1310 Dysphagia, unspecified: Secondary | ICD-10-CM | POA: Insufficient documentation

## 2020-04-21 DIAGNOSIS — K625 Hemorrhage of anus and rectum: Secondary | ICD-10-CM | POA: Insufficient documentation

## 2020-04-21 DIAGNOSIS — L601 Onycholysis: Secondary | ICD-10-CM | POA: Diagnosis not present

## 2020-04-21 DIAGNOSIS — K921 Melena: Secondary | ICD-10-CM | POA: Insufficient documentation

## 2020-04-21 DIAGNOSIS — K5730 Diverticulosis of large intestine without perforation or abscess without bleeding: Secondary | ICD-10-CM | POA: Insufficient documentation

## 2020-04-21 DIAGNOSIS — R1032 Left lower quadrant pain: Secondary | ICD-10-CM | POA: Insufficient documentation

## 2020-04-21 DIAGNOSIS — K449 Diaphragmatic hernia without obstruction or gangrene: Secondary | ICD-10-CM | POA: Insufficient documentation

## 2020-04-23 NOTE — Telephone Encounter (Signed)
No but I will check on Cover My Meds & get this started

## 2020-04-27 NOTE — Progress Notes (Signed)
Subjective:   Patient ID: Diana Wolfe, female   DOB: 70 y.o.   MRN: [id number]   HPI 70 year old female presents the office today for concerns of her left big toenail becoming thickened discolored.  She states that she dropped a vacuum on this about 4 months ago.  She states that previously was redness around the area was infected and she took antibiotics which resolved this however the nail still thick and discolored.  Currently denies any drainage or pus any swelling or redness.  She has no other concerns today.   Review of Systems  All other systems reviewed and are negative.  Past Medical History:  Diagnosis Date  . ADD (attention deficit disorder)   . Anxiety   . Depression   . GERD (gastroesophageal reflux disease)   . Hyperlipidemia   . Hypertension   . Hypothyroid   . PONV (postoperative nausea and vomiting)   . Vitamin D deficiency     Past Surgical History:  Procedure Laterality Date  . CESAREAN SECTION    . ESOPHAGOGASTRODUODENOSCOPY N/A 09/02/2015   Procedure: ESOPHAGOGASTRODUODENOSCOPY (EGD);  Surgeon: Carman Ching, MD;  Location: [facility] ENDOSCOPY;  Service: Endoscopy;  Laterality: N/A;  . FOOT SURGERY Right   . IM NAILING TIBIA Left 01/09/2001   Dr. Doristine Section, [facility]  . TONSILLECTOMY  2002     Current Outpatient Medications:  .  clobetasol (TEMOVATE) 0.05 % external solution, Apply topically., Disp: , Rfl:  .  escitalopram (LEXAPRO) 10 MG tablet, Take 1 tablet by mouth daily., Disp: , Rfl:  .  ondansetron (ZOFRAN) 4 MG tablet, Take by mouth., Disp: , Rfl:  .  venlafaxine XR (EFFEXOR-XR) 37.5 MG 24 hr capsule, , Disp: , Rfl:  .  ALPRAZolam (XANAX) 1 MG tablet, TAKE 1/2 TO 1 TABLET BY MOUTH AS NEEDED FOR SLEEP/ ANXIETY. AVOID DAILY USE DUE TO ADDICTIVE POTENTIAL, LIMIT TO <5 DAYS PER WEEK., Disp: 30 tablet, Rfl: 0 .  amoxicillin (AMOXIL) 500 MG capsule, Take 500 mg by mouth 3 (three) times daily., Disp: , Rfl:  .  amoxicillin-clavulanate (AUGMENTIN)  875-125 MG tablet, Take 1 tablet by mouth 2 (two) times daily. 10 days, Disp: 20 tablet, Rfl: 0 .  amphetamine-dextroamphetamine (ADDERALL) 20 MG tablet, Take 1/2 to 1 tablet daily if needed for ADD, Disp: 30 tablet, Rfl: 0 .  aspirin 81 MG chewable tablet, 1 tablet, Disp: , Rfl:  .  busPIRone (BUSPAR) 5 MG tablet, TAKE ONE TABLET BY MOUTH THREE TIMES A DAY AS NEEDED FOR ANXIETY, Disp: 90 tablet, Rfl: 2 .  cetirizine (ZYRTEC ALLERGY) 10 MG tablet, 1 tablet, Disp: , Rfl:  .  conjugated estrogens (PREMARIN) vaginal cream, Place 1 Applicatorful vaginally daily. (Patient not taking: Reported on 04/16/2020), Disp: 30 g, Rfl: 2 .  cyanocobalamin (,VITAMIN B-12,) 1000 MCG/ML injection, Inject 1 ml (1000 mcg) into the Skin Monthly for Vitamin B12 Deficiency, Disp: 10 mL, Rfl: 1 .  cyclobenzaprine (FLEXERIL) 5 MG tablet, Take 1 tablet (5 mg total) by mouth 3 (three) times daily as needed for muscle spasms., Disp: 60 tablet, Rfl: 0 .  EPINEPHrine 0.15 MG/0.15ML IJ injection, Inject 0.15 mLs (0.15 mg total) into the muscle as needed for anaphylaxis., Disp: 1 Device, Rfl: 2 .  Estradiol (IMVEXXY MAINTENANCE PACK) 4 MCG INST, Place 4 mcg vaginally every 3 (three) days., Disp: 8 each, Rfl: 4 .  fluconazole (DIFLUCAN) 150 MG tablet, Take one tablet at onset of vaginal yeast symptoms and second tablet three days later for  yeast. (Patient not taking: Reported on 04/16/2020), Disp: 2 tablet, Rfl: 0 .  ketoconazole (NIZORAL) 2 % cream, APPLY TO AFFECTED AREA(S) TWO TIMES A DAY, Disp: 60 g, Rfl: 3 .  levothyroxine (SYNTHROID) 125 MCG tablet, TAKE 1 TABLET DAILY ON AN EMPTY STOMACH WITH ONLY WATER FOR 30 MINUTES & NO ANTACID MEDS, CALCIUM OR MAGNESIUM FOR 4 HOURS & AVOID BIOTIN- generic is okay, Disp: 90 tablet, Rfl: 1 .  lidocaine (LIDODERM) 5 %, Place 1 patch onto the skin daily. Remove & Discard patch within 12 hours or as directed by MD (Patient not taking: Reported on 04/16/2020), Disp: 30 patch, Rfl: 3 .  meclizine  (ANTIVERT) 25 MG tablet, 1/2-1 pill up to 3 times daily for motion sickness/dizziness (Patient taking differently: as needed. 1/2-1 pill up to 3 times daily for motion sickness/dizziness), Disp: 30 tablet, Rfl: 0 .  meloxicam (MOBIC) 15 MG tablet, TAKE 1/2 TO 1 TABLET DAILY WITH FOOD FOR PAIN & INFLAMMATION, Disp: 90 tablet, Rfl: 1 .  omeprazole (PRILOSEC) 40 MG capsule, Take 1 capsule Daily to Prevent Indigestion &  Heartburn, Disp: 90 capsule, Rfl: 0 .  polyethylene glycol-electrolytes (NULYTELY) 420 g solution, MIX AND DRINK AS DIRECTED, Disp: , Rfl:  .  sertraline (ZOLOFT) 50 MG tablet, Take 1 tablet (50 mg total) by mouth daily. (Patient not taking: Reported on 04/16/2020), Disp: 30 tablet, Rfl: 2 .  SYRINGE/NEEDLE, DISP, 1 ML (B-D SYRINGE/NEEDLE 1CC/25GX5/8) 25G X 5/8" 1 ML MISC, 1 SQ injection once weekly, Disp: 100 each, Rfl: 1 .  triamcinolone ointment (KENALOG) 0.1 %, Apply 1 application topically 2 (two) times daily., Disp: 80 g, Rfl: 1 .  vitamin C (ASCORBIC ACID) 500 MG tablet, Take 1,200 mg by mouth daily., Disp: , Rfl:  .  Vitamin D, Ergocalciferol, (DRISDOL) 1.25 MG (50000 UNIT) CAPS capsule, Take 1 capsule 3 x /week on Mon Wed & Fri for Severe Vitamin D Deficiency, Disp: 36 capsule, Rfl: 3  Allergies  Allergen Reactions  . Levofloxacin     Neck sweling/difficult breathing  . Cefprozil   . Codeine     REACTION: vomiting  . Hydrocodone-Acetaminophen   . Morphine   . Pravastatin Swelling  . Lexapro [Escitalopram Oxalate] Other (See Comments)    Tapping/abnormal movements         Objective:  Physical Exam  General: AAO x3, NAD  Dermatological: Left hallux toenails hypertrophic, dystrophic with yellow-brown discoloration.  Loose from the underlying nail bed distally but from adhered proximally.  There is no edema, erythema or signs of infection noted at the toenail site today.  No open lesions.  Vascular: Dorsalis Pedis artery and Posterior Tibial artery pedal pulses are  2/4 bilateral with immedate capillary fill time. There is no pain with calf compression, swelling, warmth, erythema.   Neruologic: Grossly intact via light touch bilateral.  Musculoskeletal: No gross boney pedal deformities bilateral. No pain, crepitus, or limitation noted with foot and ankle range of motion bilateral. Muscular strength 5/5 in all groups tested bilateral.     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  She wants to hold off on removal of the nail today.  I did sharply debride the nail into this for culture, pathology to [facility] labs.  We will await results of the culture before proceeding with further treatment.  Lesia Sago  Jacqualyn Posey DPM

## 2020-04-30 ENCOUNTER — Telehealth: Payer: Self-pay

## 2020-04-30 NOTE — Telephone Encounter (Signed)
Patient inquiring about the status of the PA for her Adderall.   PA-Approved.   Patient is aware.

## 2020-05-02 ENCOUNTER — Encounter: Payer: Self-pay | Admitting: Podiatry

## 2020-05-03 ENCOUNTER — Other Ambulatory Visit: Payer: Self-pay | Admitting: Podiatry

## 2020-05-03 MED ORDER — UREA NAIL 45 % EX GEL: 1.0000 "application " | Freq: Every day | CUTANEOUS | 2 refills | Status: DC

## 2020-05-04 ENCOUNTER — Telehealth: Payer: Self-pay

## 2020-05-04 ENCOUNTER — Other Ambulatory Visit: Payer: Self-pay

## 2020-05-04 ENCOUNTER — Ambulatory Visit (INDEPENDENT_AMBULATORY_CARE_PROVIDER_SITE_OTHER): Payer: Medicare Other

## 2020-05-04 VITALS — BP 120/82 | HR 91 | Temp 97.5°F | Wt 157.0 lb

## 2020-05-04 DIAGNOSIS — Z79899 Other long term (current) drug therapy: Secondary | ICD-10-CM | POA: Diagnosis not present

## 2020-05-04 MED ORDER — NONFORMULARY OR COMPOUNDED ITEM: 3 refills | Status: DC

## 2020-05-04 NOTE — Progress Notes (Signed)
Patient presents to the office for a nurse visit. She recently was treated for a UTI but is having back pain that won't go away and doesn't think it has anything to do with a pulled muscle. Concerned that the infection is moving into her kidneys. Vitals taken and recorded. Urine specimen collected and given to the lab.

## 2020-05-04 NOTE — Telephone Encounter (Signed)
Prescription for anti-fungal solution faxed to [facility] 

## 2020-05-05 ENCOUNTER — Other Ambulatory Visit: Payer: Self-pay | Admitting: Adult Health Nurse Practitioner

## 2020-05-05 DIAGNOSIS — M542 Cervicalgia: Secondary | ICD-10-CM

## 2020-05-05 LAB — URINALYSIS, ROUTINE W REFLEX MICROSCOPIC
Bacteria, UA: NONE SEEN /HPF
Bilirubin Urine: NEGATIVE
Glucose, UA: NEGATIVE
Hgb urine dipstick: NEGATIVE
Hyaline Cast: NONE SEEN /LPF
Ketones, ur: NEGATIVE
Nitrite: NEGATIVE
Protein, ur: NEGATIVE
RBC / HPF: NONE SEEN /HPF (ref 0–2)
Specific Gravity, Urine: 1.006 (ref 1.001–1.03)
pH: 5.5 (ref 5.0–8.0)

## 2020-05-05 LAB — URINE CULTURE
MICRO NUMBER:: 11644819
SPECIMEN QUALITY:: ADEQUATE

## 2020-05-05 MED ORDER — MELOXICAM 15 MG PO TABS
ORAL_TABLET | ORAL | 1 refills | Status: DC
Start: 2020-05-05 — End: 2021-03-22

## 2020-05-14 ENCOUNTER — Telehealth: Payer: Self-pay

## 2020-05-14 NOTE — Telephone Encounter (Signed)
Refill request for Adderall

## 2020-05-16 DIAGNOSIS — Z20822 Contact with and (suspected) exposure to covid-19: Secondary | ICD-10-CM | POA: Diagnosis not present

## 2020-05-18 ENCOUNTER — Other Ambulatory Visit: Payer: Self-pay | Admitting: Adult Health

## 2020-05-18 DIAGNOSIS — F988 Other specified behavioral and emotional disorders with onset usually occurring in childhood and adolescence: Secondary | ICD-10-CM

## 2020-05-18 MED ORDER — AMPHETAMINE-DEXTROAMPHETAMINE 20 MG PO TABS: ORAL_TABLET | ORAL | 0 refills | Status: DC

## 2020-05-18 NOTE — Progress Notes (Signed)
Future Appointments  Date Time Provider Department Center  10/16/2020 10:30 AM Ilea Hilton, NP [facility] None  03/24/2021  3:00 PM McClanahan, Kyra, NP [facility] None    PDMP reviewed for adderall refill request.   

## 2020-05-21 ENCOUNTER — Other Ambulatory Visit: Payer: Self-pay | Admitting: Adult Health

## 2020-05-21 DIAGNOSIS — F419 Anxiety disorder, unspecified: Secondary | ICD-10-CM

## 2020-06-02 DIAGNOSIS — H3561 Retinal hemorrhage, right eye: Secondary | ICD-10-CM | POA: Diagnosis not present

## 2020-06-22 ENCOUNTER — Other Ambulatory Visit: Payer: Self-pay | Admitting: Adult Health

## 2020-06-22 DIAGNOSIS — F988 Other specified behavioral and emotional disorders with onset usually occurring in childhood and adolescence: Secondary | ICD-10-CM

## 2020-06-22 MED ORDER — AMPHETAMINE-DEXTROAMPHETAMINE 20 MG PO TABS: ORAL_TABLET | ORAL | 0 refills | Status: DC

## 2020-06-22 NOTE — Progress Notes (Signed)
Future Appointments  Date Time Provider Department Center  10/16/2020 10:30 AM Judd Gaudier, NP [facility] None  03/24/2021  3:00 PM Elder Negus, NP [facility] None    PDMP reviewed for adderall refill request.

## 2020-06-30 ENCOUNTER — Other Ambulatory Visit: Payer: Self-pay | Admitting: *Deleted

## 2020-06-30 MED ORDER — BUSPIRONE HCL 5 MG PO TABS: 5.0000 mg | ORAL_TABLET | Freq: Three times a day (TID) | ORAL | 1 refills | Status: DC | PRN

## 2020-06-30 MED ORDER — LEVOTHYROXINE SODIUM 125 MCG PO TABS: ORAL_TABLET | ORAL | 1 refills | Status: DC

## 2020-07-15 ENCOUNTER — Other Ambulatory Visit: Payer: Self-pay | Admitting: Adult Health

## 2020-07-15 ENCOUNTER — Encounter (INDEPENDENT_AMBULATORY_CARE_PROVIDER_SITE_OTHER): Payer: Medicare Other

## 2020-07-15 DIAGNOSIS — N390 Urinary tract infection, site not specified: Secondary | ICD-10-CM

## 2020-07-15 MED ORDER — SULFAMETHOXAZOLE-TRIMETHOPRIM 800-160 MG PO TABS: 1.0000 | ORAL_TABLET | Freq: Two times a day (BID) | ORAL | 0 refills | Status: DC

## 2020-07-15 NOTE — Telephone Encounter (Signed)
Virtual Visit via Telephone Note  I connected with Diana Wolfe on 07/15/2020 at  by telephone and verified that I am speaking with the correct person using two identifiers.  Location: Patient: [place] Provider: [facility] office   I discussed the limitations, risks, security and privacy concerns of performing an evaluation and management service by telephone and the availability of in person appointments. I also discussed with the patient that there may be a patient responsible charge related to this service. The patient expressed understanding and agreed to proceed.   History of Present Illness:  70 y.o. female with hx of vaginal atrophy and UTI contacted office while out of town reporting 4 days of UTI sx, progressive and unable to be seen due to travel in remote location.   She reports 4 days ago started having dysuria, initially mild but progressive, also notes lower abdominal pressure (improved), she notes urgency and frequency (progressive), waking her up at night. She denies noting urine character changes. She denies fever/chills or flank pain.   She denies vaginal discharge, itching. No hx of STIs, no new sexual partners.   Last UTI was 3+ months ago, resolved with bactrim DS. Allergy to levaquin and cefpozil -   Current Outpatient Medications on File Prior to Visit  Medication Sig Dispense Refill  . ALPRAZolam (XANAX) 1 MG tablet TAKE 1/2 TO 1 TABLET BY MOUTH AS NEEDED FOR SLEEP/ ANXIETY AVOID DAILY USE DUE TO ADDICTIVE POTENTIAL LIMIT TO LESS THAN 5 DAYS PER WEEK 30 tablet 1  . amoxicillin (AMOXIL) 500 MG capsule Take 500 mg by mouth 3 (three) times daily.    Marland Kitchen amoxicillin-clavulanate (AUGMENTIN) 875-125 MG tablet Take 1 tablet by mouth 2 (two) times daily. 10 days 20 tablet 0  . amphetamine-dextroamphetamine (ADDERALL) 20 MG tablet Take 1/2 to 1 tablet daily if needed for ADD 30 tablet 0  . aspirin 81 MG chewable tablet 1 tablet    . busPIRone (BUSPAR) 5 MG tablet Take 1  tablet (5 mg total) by mouth 3 (three) times daily as needed. for anxiety 90 tablet 1  . cetirizine (ZYRTEC ALLERGY) 10 MG tablet 1 tablet    . clobetasol (TEMOVATE) 0.05 % external solution Apply topically.    . conjugated estrogens (PREMARIN) vaginal cream Place 1 Applicatorful vaginally daily. (Patient not taking: Reported on 04/16/2020) 30 g 2  . cyanocobalamin (,VITAMIN B-12,) 1000 MCG/ML injection Inject 1 ml (1000 mcg) into the Skin Monthly for Vitamin B12 Deficiency 10 mL 1  . cyclobenzaprine (FLEXERIL) 5 MG tablet Take 1 tablet (5 mg total) by mouth 3 (three) times daily as needed for muscle spasms. 60 tablet 0  . EPINEPHrine 0.15 MG/0.15ML IJ injection Inject 0.15 mLs (0.15 mg total) into the muscle as needed for anaphylaxis. 1 Device 2  . escitalopram (LEXAPRO) 10 MG tablet Take 1 tablet by mouth daily.    . Estradiol (IMVEXXY MAINTENANCE PACK) 4 MCG INST Place 4 mcg vaginally every 3 (three) days. 8 each 4  . fluconazole (DIFLUCAN) 150 MG tablet Take one tablet at onset of vaginal yeast symptoms and second tablet three days later for yeast. (Patient not taking: Reported on 04/16/2020) 2 tablet 0  . ketoconazole (NIZORAL) 2 % cream APPLY TO AFFECTED AREA(S) TWO TIMES A DAY 60 g 3  . levothyroxine (SYNTHROID) 125 MCG tablet TAKE 1 TABLET DAILY ON AN EMPTY STOMACH WITH ONLY WATER FOR 30 MINUTES & NO ANTACID MEDS, CALCIUM OR MAGNESIUM FOR 4 HOURS & AVOID BIOTIN- generic is okay  90 tablet 1  . lidocaine (LIDODERM) 5 % Place 1 patch onto the skin daily. Remove & Discard patch within 12 hours or as directed by MD (Patient not taking: Reported on 04/16/2020) 30 patch 3  . meclizine (ANTIVERT) 25 MG tablet 1/2-1 pill up to 3 times daily for motion sickness/dizziness (Patient taking differently: as needed. 1/2-1 pill up to 3 times daily for motion sickness/dizziness) 30 tablet 0  . meloxicam (MOBIC) 15 MG tablet TAKE 1/2 TO 1 TABLET DAILY WITH FOOD FOR PAIN & INFLAMMATION 90 tablet 1  . NONFORMULARY OR  COMPOUNDED ITEM Antifungal solution: Terbinafine 3%, Fluconazole 2%, Tea Tree Oil 5%, Urea 10%, Ibuprofen 2% in DMSO suspension #16mL 1 each 3  . omeprazole (PRILOSEC) 40 MG capsule Take 1 capsule Daily to Prevent Indigestion &  Heartburn 90 capsule 0  . ondansetron (ZOFRAN) 4 MG tablet Take by mouth.    . polyethylene glycol-electrolytes (NULYTELY) 420 g solution MIX AND DRINK AS DIRECTED    . sertraline (ZOLOFT) 50 MG tablet Take 1 tablet (50 mg total) by mouth daily. (Patient not taking: Reported on 04/16/2020) 30 tablet 2  . SYRINGE/NEEDLE, DISP, 1 ML (B-D SYRINGE/NEEDLE 1CC/25GX5/8) 25G X 5/8" 1 ML MISC 1 SQ injection once weekly 100 each 1  . triamcinolone ointment (KENALOG) 0.1 % Apply 1 application topically 2 (two) times daily. 80 g 1  . Urea (UREA NAIL) 45 % GEL Apply 1 application topically daily. 28 mL 2  . venlafaxine XR (EFFEXOR-XR) 37.5 MG 24 hr capsule     . vitamin C (ASCORBIC ACID) 500 MG tablet Take 1,200 mg by mouth daily.    . Vitamin D, Ergocalciferol, (DRISDOL) 1.25 MG (50000 UNIT) CAPS capsule Take 1 capsule 3 x /week on Mon Wed & Fri for Severe Vitamin D Deficiency 36 capsule 3   No current facility-administered medications on file prior to visit.     Allergies:  Allergies  Allergen Reactions  . Levofloxacin     Neck sweling/difficult breathing  . Cefprozil   . Codeine     REACTION: vomiting  . Hydrocodone-Acetaminophen   . Morphine   . Pravastatin Swelling  . Lexapro [Escitalopram Oxalate] Other (See Comments)    Tapping/abnormal movements   Medical History:  has Hypothyroidism; Iron deficiency anemia; Obstructive sleep apnea; Allergic rhinitis; Headache; PULMONARY EMBOLISM, HX OF; Hyperlipidemia; Hypertension; Anxiety; GERD (gastroesophageal reflux disease); ADD (attention deficit disorder); Depression; Vitamin D deficiency; Abnormal glucose; Vaginal atrophy; Medication management; B12 deficiency; Diaphragmatic hernia; Diverticular disease of colon; Dysphagia;  Generalized abdominal pain; Hematochezia; Left lower quadrant pain; Melena; Personal history of colonic polyps; Rectal bleeding; and Urticaria on their problem list.   Observations/Objective:  General : Well sounding patient in no apparent distress HEENT: no hoarseness, no cough for duration of visit Lungs: speaks in complete sentences, no audible wheezing, no apparent distress Neurological: alert, oriented x 3 Psychiatric: pleasant, judgement appropriate    Assessment and Plan:  Diagnoses and all orders for this visit:  Urinary tract infection without hematuria, site unspecified Presumptive treatment;  Start/complete treatment as prescribed, push fluids Expect sx to improve within 24-48 hours but complete course If any recurrent sx or not resolving would need to be seen for exam and UA/culture -     sulfamethoxazole-trimethoprim (BACTRIM DS) 800-160 MG tablet; Take 1 tablet by mouth 2 (two) times daily.   Follow Up Instructions:    I discussed the assessment and treatment plan with the patient. The patient was provided an opportunity to ask questions  and all were answered. The patient agreed with the plan and demonstrated an understanding of the instructions.   The patient was advised to call back or seek an in-person evaluation if the symptoms worsen or if the condition fails to improve as anticipated.  I provided 15 minutes of non-face-to-face time during this encounter.   Dan Maker, NP

## 2020-08-12 ENCOUNTER — Other Ambulatory Visit: Payer: Self-pay | Admitting: Adult Health

## 2020-08-12 ENCOUNTER — Telehealth: Payer: Self-pay

## 2020-08-12 ENCOUNTER — Other Ambulatory Visit: Payer: Self-pay

## 2020-08-12 DIAGNOSIS — F988 Other specified behavioral and emotional disorders with onset usually occurring in childhood and adolescence: Secondary | ICD-10-CM

## 2020-08-12 MED ORDER — AMPHETAMINE-DEXTROAMPHETAMINE 20 MG PO TABS
ORAL_TABLET | ORAL | 0 refills | Status: DC
Start: 2020-08-12 — End: 2020-09-26

## 2020-08-12 NOTE — Telephone Encounter (Signed)
Adderall refill request 

## 2020-08-12 NOTE — Progress Notes (Signed)
Future Appointments  Date Time Provider Department Center  10/16/2020 10:30 AM Judd Gaudier, NP [facility] None  03/24/2021  3:00 PM Elder Negus, NP [facility] None    PDMP reviewed for adderall refill request.

## 2020-08-12 NOTE — Progress Notes (Signed)
Entered in error

## 2020-08-28 ENCOUNTER — Ambulatory Visit (INDEPENDENT_AMBULATORY_CARE_PROVIDER_SITE_OTHER): Payer: Medicare Other | Admitting: Nurse Practitioner

## 2020-08-28 ENCOUNTER — Encounter: Payer: Self-pay | Admitting: Nurse Practitioner

## 2020-08-28 ENCOUNTER — Other Ambulatory Visit: Payer: Self-pay

## 2020-08-28 VITALS — BP 136/74 | HR 78 | Temp 97.5°F | Wt 158.0 lb

## 2020-08-28 DIAGNOSIS — R1031 Right lower quadrant pain: Secondary | ICD-10-CM

## 2020-08-28 DIAGNOSIS — R3915 Urgency of urination: Secondary | ICD-10-CM

## 2020-08-28 MED ORDER — NITROFURANTOIN MONOHYD MACRO 100 MG PO CAPS: 100.0000 mg | ORAL_CAPSULE | Freq: Two times a day (BID) | ORAL | 0 refills | Status: AC

## 2020-08-28 NOTE — Progress Notes (Signed)
Assessment and Plan:  Diana Wolfe was seen today for medication management.  Diagnoses and all orders for this visit:  Right lower quadrant abdominal pain -     Urinalysis, Routine w reflex microscopic -     CBC with Differential/Platelet -     US Abdomen Complete  Urinary urgency -     nitrofurantoin, macrocrystal-monohydrate, (MACROBID) 100 MG capsule; Take 1 capsule (100 mg total) by mouth 2 (two) times daily for 7 days. -     Urine Culture -     Urinalysis, Routine w reflex microscopic      Further disposition pending results of labs. Discussed med's effects and SE's.   Over 30 minutes of exam, counseling, chart review, and critical decision making was performed.   Future Appointments  Date Time Provider Department Center  10/16/2020 10:30 AM Judd Gaudier, NP [facility] None  03/24/2021  3:00 PM McClanahan, Bella Kennedy, NP [facility] None    ------------------------------------------------------------------------------------------------------------------   HPI 70 y.o. female with hx of vaginal atrophy and UTI.   She reports 3 days ago started having urinary urgency and frequency also notes Suprapubic pressure /pelvic pain and pressure, urinary urgency is waking her up at night.  She denies noting urine character changes. She denies fever/chills or flank pain.   She was treated with Diflucan for Candida, 1st pill was on Thursday and second pill today. No hx of STIs, no new sexual partners.      Past Medical History:  Diagnosis Date   ADD (attention deficit disorder)    Anxiety    Depression    GERD (gastroesophageal reflux disease)    Hyperlipidemia    Hypertension    Hypothyroid    PONV (postoperative nausea and vomiting)    Vitamin D deficiency      Allergies  Allergen Reactions   Levofloxacin     Neck sweling/difficult breathing   Cefprozil    Codeine     REACTION: vomiting   Hydrocodone-Acetaminophen    Morphine    Pravastatin Swelling   Lexapro  [Escitalopram Oxalate] Other (See Comments)    Tapping/abnormal movements    Current Outpatient Medications on File Prior to Visit  Medication Sig   ALPRAZolam (XANAX) 1 MG tablet TAKE 1/2 TO 1 TABLET BY MOUTH AS NEEDED FOR SLEEP/ ANXIETY AVOID DAILY USE DUE TO ADDICTIVE POTENTIAL LIMIT TO LESS THAN 5 DAYS PER WEEK   amoxicillin (AMOXIL) 500 MG capsule Take 500 mg by mouth 3 (three) times daily.   amoxicillin-clavulanate (AUGMENTIN) 875-125 MG tablet Take 1 tablet by mouth 2 (two) times daily. 10 days   amphetamine-dextroamphetamine (ADDERALL) 20 MG tablet Take 1/2 to 1 tablet daily if needed for ADD   aspirin 81 MG chewable tablet 1 tablet   busPIRone (BUSPAR) 5 MG tablet Take 1 tablet (5 mg total) by mouth 3 (three) times daily as needed. for anxiety   cetirizine (ZYRTEC) 10 MG tablet 1 tablet   clobetasol (TEMOVATE) 0.05 % external solution Apply topically.   conjugated estrogens (PREMARIN) vaginal cream Place 1 Applicatorful vaginally daily.   cyanocobalamin (,VITAMIN B-12,) 1000 MCG/ML injection Inject 1 ml (1000 mcg) into the Skin Monthly for Vitamin B12 Deficiency   cyclobenzaprine (FLEXERIL) 5 MG tablet Take 1 tablet (5 mg total) by mouth 3 (three) times daily as needed for muscle spasms.   EPINEPHrine 0.15 MG/0.15ML IJ injection Inject 0.15 mLs (0.15 mg total) into the muscle as needed for anaphylaxis.   escitalopram (LEXAPRO) 10 MG tablet Take 1 tablet by mouth daily.  Estradiol (IMVEXXY MAINTENANCE PACK) 4 MCG INST Place 4 mcg vaginally every 3 (three) days.   fluconazole (DIFLUCAN) 150 MG tablet Take one tablet at onset of vaginal yeast symptoms and second tablet three days later for yeast.   ketoconazole (NIZORAL) 2 % cream APPLY TO AFFECTED AREA(S) TWO TIMES A DAY   levothyroxine (SYNTHROID) 125 MCG tablet TAKE 1 TABLET DAILY ON AN EMPTY STOMACH WITH ONLY WATER FOR 30 MINUTES & NO ANTACID MEDS, CALCIUM OR MAGNESIUM FOR 4 HOURS & AVOID BIOTIN- generic is okay   lidocaine  (LIDODERM) 5 % Place 1 patch onto the skin daily. Remove & Discard patch within 12 hours or as directed by MD   meclizine (ANTIVERT) 25 MG tablet 1/2-1 pill up to 3 times daily for motion sickness/dizziness (Patient taking differently: as needed. 1/2-1 pill up to 3 times daily for motion sickness/dizziness)   meloxicam (MOBIC) 15 MG tablet TAKE 1/2 TO 1 TABLET DAILY WITH FOOD FOR PAIN & INFLAMMATION   NONFORMULARY OR COMPOUNDED ITEM Antifungal solution: Terbinafine 3%, Fluconazole 2%, Tea Tree Oil 5%, Urea 10%, Ibuprofen 2% in DMSO suspension #29mL   omeprazole (PRILOSEC) 40 MG capsule Take 1 capsule Daily to Prevent Indigestion &  Heartburn   ondansetron (ZOFRAN) 4 MG tablet Take by mouth.   polyethylene glycol-electrolytes (NULYTELY) 420 g solution MIX AND DRINK AS DIRECTED   sertraline (ZOLOFT) 50 MG tablet Take 1 tablet (50 mg total) by mouth daily.   sulfamethoxazole-trimethoprim (BACTRIM DS) 800-160 MG tablet Take 1 tablet by mouth 2 (two) times daily.   SYRINGE/NEEDLE, DISP, 1 ML (B-D SYRINGE/NEEDLE 1CC/25GX5/8) 25G X 5/8" 1 ML MISC 1 SQ injection once weekly   triamcinolone ointment (KENALOG) 0.1 % Apply 1 application topically 2 (two) times daily.   Urea (UREA NAIL) 45 % GEL Apply 1 application topically daily.   venlafaxine XR (EFFEXOR-XR) 37.5 MG 24 hr capsule    vitamin C (ASCORBIC ACID) 500 MG tablet Take 1,200 mg by mouth daily.   Vitamin D, Ergocalciferol, (DRISDOL) 1.25 MG (50000 UNIT) CAPS capsule Take 1 capsule 3 x /week on Mon Wed & Fri for Severe Vitamin D Deficiency   No current facility-administered medications on file prior to visit.    Review of Systems  Constitutional:  Negative for chills and fever.  Respiratory:  Negative for cough and shortness of breath.   Cardiovascular:  Negative for chest pain, palpitations and leg swelling.  Gastrointestinal:  Positive for abdominal pain (right lower quadrant x several days).  Genitourinary:  Positive for frequency and  urgency. Negative for dysuria and hematuria.  Musculoskeletal:  Negative for myalgias.  Skin:  Negative for rash.    Physical Exam:  BP 136/74   Pulse 78   Temp (!) 97.5 F (36.4 C)   Wt 158 lb (71.7 kg)   SpO2 97%   BMI 29.37 kg/m   General Appearance: Well nourished, in no apparent distress. Eyes: PERRLA, EOMs, conjunctiva no swelling or erythema Sinuses: No Frontal/maxillary tenderness ENT/Mouth: Ext aud canals clear, TMs without erythema, bulging. No erythema, swelling, or exudate on post pharynx.  Tonsils not swollen or erythematous. Hearing normal.  Neck: Supple, thyroid normal.  Respiratory: Respiratory effort normal, BS equal bilaterally without rales, rhonchi, wheezing or stridor.  Cardio: RRR with no MRGs. Brisk peripheral pulses without edema.  Abdomen: Soft, + BS.  Tenderness RLQ Genitourinary: Very difficult to palpate due to pt guarding but very tender when palpating right adnexal region.  Lymphatics: Non tender without lymphadenopathy.  Musculoskeletal: Full  ROM, 5/5 strength, normal gait. + Left CVA tenderness  Skin: Warm, dry without rashes, lesions, ecchymosis.  Neuro: Cranial nerves intact. Normal muscle tone, no cerebellar symptoms. Sensation intact.  Psych: Awake and oriented X 3, normal affect, Insight and Judgment appropriate.     Revonda Humphrey, NP 11:57 AM [facility]

## 2020-08-28 NOTE — Patient Instructions (Addendum)
Ordered abdominal U/S call [facility] [address] to schedule  Levofloxacin LevofloxacinHighReaction Type: Allergy   Noted:    Neck sweling/difficult breathingDeletion Reason: Cefprozil CefprozilNot SpecifiedReaction Type:    Noted:    Deletion Reason: Codeine CodeineNot SpecifiedReaction Type:    Noted: 04/25/2008   REACTION: vomitingDeletion Reason: Hydrocodone-acetaminophen Hydrocodone-acetaminophenNot SpecifiedReaction Type:    Noted:    Deletion Reason: Morphine MorphineNot SpecifiedReaction Type:    Noted:    Deletion Reason: Pravastatin PravastatinSwellingNot SpecifiedReaction Type: Allergy   Noted: 03/27/2014   Deletion Reason: Adverse Reactions/Drug Intolerances Lexapro [Escitalopram Oxalate] Lexapro [Escitalopram Oxalate]Other (See Comments)Low Urinary Tract Infection, Adult A urinary tract infection (UTI) is an infection of any part of the urinary tract. The urinary tract includes: The kidneys. The ureters. The bladder. The urethra. These organs make, store, and get rid of pee (urine) in the body. What are the causes? This infection is caused by germs (bacteria) in your genital area. These germs grow and cause swelling (inflammation) of your urinary tract. What increases the risk? The following factors may make you more likely to develop this condition: Using a small, thin tube (catheter) to drain pee. Not being able to control when you pee or poop (incontinence). Being female. If you are female, these things can increase the risk: Using these methods to prevent pregnancy: A medicine that kills sperm (spermicide). A device that blocks sperm (diaphragm). Having low levels of a female hormone (estrogen). Being pregnant. You are more likely to develop this condition if: You have genes that add to your risk. You are sexually active. You take antibiotic medicines. You have trouble peeing because of: A prostate that is bigger than normal, if you are female. A blockage in  the part of your body that drains pee from the bladder. A kidney stone. A nerve condition that affects your bladder. Not getting enough to drink. Not peeing often enough. You have other conditions, such as: Diabetes. A weak disease-fighting system (immune system). Sickle cell disease. Gout. Injury of the spine. What are the signs or symptoms? Symptoms of this condition include: Needing to pee right away. Peeing small amounts often. Pain or burning when peeing. Blood in the pee. Pee that smells bad or not like normal. Trouble peeing. Pee that is cloudy. Fluid coming from the vagina, if you are female. Pain in the belly or lower back. Other symptoms include: Vomiting. Not feeling hungry. Feeling mixed up (confused). This may be the first symptom in older adults. Being tired and grouchy (irritable). A fever. Watery poop (diarrhea). How is this treated? Taking antibiotic medicine. Taking other medicines. Drinking enough water. In some cases, you may need to see a specialist. Follow these instructions at home:  Medicines Take over-the-counter and prescription medicines only as told by your doctor. If you were prescribed an antibiotic medicine, take it as told by your doctor. Do not stop taking it even if you start to feel better. General instructions Make sure you: Pee until your bladder is empty. Do not hold pee for a long time. Empty your bladder after sex. Wipe from front to back after peeing or pooping if you are a female. Use each tissue one time when you wipe. Drink enough fluid to keep your pee pale yellow. Keep all follow-up visits. Contact a doctor if: You do not get better after 1-2 days. Your symptoms go away and then come back. Get help right away if: You have very bad back pain. You have very bad pain in your  lower belly. You have a fever. You have chills. You feeling like you will vomit or you vomit. Summary A urinary tract infection (UTI) is an  infection of any part of the urinary tract. This condition is caused by germs in your genital area. There are many risk factors for a UTI. Treatment includes antibiotic medicines. Drink enough fluid to keep your pee pale yellow. This information is not intended to replace advice given to you by your health care provider. Make sure you discuss any questions you have with your healthcare provider. Document Revised: 09/20/2019 Document Reviewed: 09/20/2019 Elsevier Patient Education  2022 [employer]  Abdominal Pain, Adult Many things can cause belly (abdominal) pain. Most times, belly pain is not dangerous. Many cases of belly pain can be watched and treated at home. Sometimes, though, belly pain is serious. Yourdoctor will try to find the cause of your belly pain. Follow these instructions at home:  Medicines Take over-the-counter and prescription medicines only as told by your doctor. Do not take medicines that help you poop (laxatives) unless told by your doctor. General instructions Watch your belly pain for any changes. Drink enough fluid to keep your pee (urine) pale yellow. Keep all follow-up visits as told by your doctor. This is important. Contact a doctor if: Your belly pain changes or gets worse. You are not hungry, or you lose weight without trying. You are having trouble pooping (constipated) or have watery poop (diarrhea) for more than 2-3 days. You have pain when you pee or poop. Your belly pain wakes you up at night. Your pain gets worse with meals, after eating, or with certain foods. You are vomiting and cannot keep anything down. You have a fever. You have blood in your pee. Get help right away if: Your pain does not go away as soon as your doctor says it should. You cannot stop vomiting. Your pain is only in areas of your belly, such as the right side or the left lower part of the belly. You have bloody or black poop, or poop that looks like tar. You have very  bad pain, cramping, or bloating in your belly. You have signs of not having enough fluid or water in your body (dehydration), such as: Dark pee, very little pee, or no pee. Cracked lips. Dry mouth. Sunken eyes. Sleepiness. Weakness. You have trouble breathing or chest pain. Summary Many cases of belly pain can be watched and treated at home. Watch your belly pain for any changes. Take over-the-counter and prescription medicines only as told by your doctor. Contact a doctor if your belly pain changes or gets worse. Get help right away if you have very bad pain, cramping, or bloating in your belly. This information is not intended to replace advice given to you by your health care provider. Make sure you discuss any questions you have with your healthcare provider. Document Revised: 06/18/2018 Document Reviewed: 06/18/2018 Elsevier Patient Education  2022 [employer].

## 2020-08-29 ENCOUNTER — Other Ambulatory Visit: Payer: Self-pay | Admitting: Adult Health

## 2020-08-29 LAB — CBC WITH DIFFERENTIAL/PLATELET
Absolute Monocytes: 613 cells/uL (ref 200–950)
Basophils Absolute: 50 cells/uL (ref 0–200)
Basophils Relative: 0.6 %
Eosinophils Absolute: 193 cells/uL (ref 15–500)
Eosinophils Relative: 2.3 %
HCT: 46.1 % — ABNORMAL HIGH (ref 35.0–45.0)
Hemoglobin: 15.1 g/dL (ref 11.7–15.5)
Lymphs Abs: 2075 cells/uL (ref 850–3900)
MCH: 29 pg (ref 27.0–33.0)
MCHC: 32.8 g/dL (ref 32.0–36.0)
MCV: 88.5 fL (ref 80.0–100.0)
MPV: 9.8 fL (ref 7.5–12.5)
Monocytes Relative: 7.3 %
Neutro Abs: 5468 cells/uL (ref 1500–7800)
Neutrophils Relative %: 65.1 %
Platelets: 335 10*3/uL (ref 140–400)
RBC: 5.21 10*6/uL — ABNORMAL HIGH (ref 3.80–5.10)
RDW: 13.2 % (ref 11.0–15.0)
Total Lymphocyte: 24.7 %
WBC: 8.4 10*3/uL (ref 3.8–10.8)

## 2020-08-29 LAB — URINALYSIS, ROUTINE W REFLEX MICROSCOPIC
Bacteria, UA: NONE SEEN /HPF
Bilirubin Urine: NEGATIVE
Glucose, UA: NEGATIVE
Hgb urine dipstick: NEGATIVE
Hyaline Cast: NONE SEEN /LPF
Ketones, ur: NEGATIVE
Nitrite: NEGATIVE
Protein, ur: NEGATIVE
Specific Gravity, Urine: 1.009 (ref 1.001–1.035)
pH: 5.5 (ref 5.0–8.0)

## 2020-08-29 LAB — URINE CULTURE
MICRO NUMBER:: 12098029
SPECIMEN QUALITY:: ADEQUATE

## 2020-08-29 LAB — MICROSCOPIC MESSAGE

## 2020-08-31 ENCOUNTER — Encounter: Payer: Self-pay | Admitting: Adult Health

## 2020-09-01 ENCOUNTER — Ambulatory Visit
Admission: RE | Admit: 2020-09-01 | Discharge: 2020-09-01 | Disposition: A | Discharge status: Home / Self Care | Payer: Medicare Other | Source: Ambulatory Visit | Attending: Nurse Practitioner | Admitting: Nurse Practitioner

## 2020-09-01 DIAGNOSIS — R1011 Right upper quadrant pain: Secondary | ICD-10-CM | POA: Diagnosis not present

## 2020-09-01 DIAGNOSIS — K760 Fatty (change of) liver, not elsewhere classified: Secondary | ICD-10-CM | POA: Diagnosis not present

## 2020-09-04 ENCOUNTER — Other Ambulatory Visit: Payer: Medicare Other

## 2020-09-08 ENCOUNTER — Other Ambulatory Visit: Payer: Self-pay | Admitting: Nurse Practitioner

## 2020-09-08 DIAGNOSIS — R1031 Right lower quadrant pain: Secondary | ICD-10-CM

## 2020-09-08 NOTE — Progress Notes (Signed)
Pt is requesting to no go ahead with CT if covered by insurance.  CT of abdomen with contrast is ordered.

## 2020-09-13 ENCOUNTER — Other Ambulatory Visit: Payer: Self-pay | Admitting: Internal Medicine

## 2020-09-13 DIAGNOSIS — R21 Rash and other nonspecific skin eruption: Secondary | ICD-10-CM

## 2020-09-13 DIAGNOSIS — K219 Gastro-esophageal reflux disease without esophagitis: Secondary | ICD-10-CM

## 2020-09-26 ENCOUNTER — Other Ambulatory Visit: Payer: Self-pay

## 2020-09-26 DIAGNOSIS — F988 Other specified behavioral and emotional disorders with onset usually occurring in childhood and adolescence: Secondary | ICD-10-CM

## 2020-09-27 ENCOUNTER — Other Ambulatory Visit: Payer: Self-pay | Admitting: Adult Health

## 2020-09-28 MED ORDER — AMPHETAMINE-DEXTROAMPHETAMINE 20 MG PO TABS: ORAL_TABLET | ORAL | 0 refills | Status: DC

## 2020-10-14 NOTE — Progress Notes (Signed)
MEDICARE WELLNESS AND FOLLOW UP  Assessment:   Diana Wolfe was seen today for follow-up and medicare wellness.  Diagnoses and all orders for this visit:  Encounter for Medicare annual wellness exam  Due Annually  Health Maintenance Reviewed  Healthy lifestyle reviewed and goals set   Essential hypertension  - continue medications, DASH diet, exercise and monitor at home. Call if greater than 130/80.    - Pt started on HCTZ 12.5mg  which she is to take if BP is greater than 130/80.  Pt to kepp BP log.  Abnormal glucose -     Hemoglobin A1c  Recurrent major depressive disorder, in full remission (HCC)  Pt believes symptoms are more related to Anxiety.  Increased Buspar to 10 mg TID PRN and she is to continue to see therapist  Attention deficit disorder, unspecified hyperactivity presence       Continue Concerta as needed  Anxiety -     busPIRone (BUSPAR) 10 MG tablet; Take 1 tablet (10 mg total) by mouth 3 (three) times daily. - Continue therapy and medication and monitor symptoms  Gastroesophageal reflux disease without esophagitis  Continue Prilosec 40 mg TID and dietary modifications  Hypothyroidism, unspecified type -     TSH  Hyperlipidemia, unspecified hyperlipidemia type -     COMPLETE METABOLIC PANEL WITH GFR -     Lipid panel  Vitamin D deficiency       - Ergocalciferol 50000 units three times a week  Defer Vit D level today  Iron deficiency anemia, unspecified iron deficiency anemia type -     CBC with Differential/Platelet  Screening for blood or protein in urine -     Urinalysis, Routine w reflex microscopic -     Microalbumin / creatinine urine ratio  Vaginal atrophy  Pt currently has D/C'd all medications, monitor symptoms  Right lower quadrant abdominal pain -     Celiac Disease Comprehensive Panel with Reflexes - Pt has upcoming abdominal CT on 10/27/20 - If all results are negative will refer back to GI     Over 40 minutes of exam, counseling,  chart review and critical decision making was performed Future Appointments  Date Time Provider Department Center  10/27/2020 10:00 AM [facility] 1 [facility]  03/24/2021  3:00 PM Revonda Humphrey, NP [facility] None  10/15/2021 11:00 AM Revonda Humphrey, NP [facility] None    Subjective:  Diana Wolfe is a 70 y.o.  female who presents for medicare wellness and 3 month follow up.   Abdominal pain bloating and cramping intermittently and is effecting appetite which is now less.  Has occasional nausea. Denies diarrhea and constipation. Has abdominal CT scheduled 10/27/20 Abdominal U/S was performed but showed no abnormalities but were unable to due transvaginal due to vaginal atrophy.  Hiatal hernia x 25 years controlled with Prilosec 3 days a week.  Lab Results  Component Value Date   ALT 23 03/24/2020   AST 22 03/24/2020   ALKPHOS 83 05/23/2016   BILITOT 0.4 03/24/2020    Anxiety /Depression/Insomnia currently on Buspar 5 mg TID as needed and Xanax 1 mg PRN QHS.Marland Kitchen Anxiety is not controlled on current dose of Buspar 5 mg TID.   ADHD symptoms are currently controlled on Adderall 20 mg 1/2 - 1 tab daily as needed.  Symptoms controlled.    Her blood pressure has been controlled at home 130's/70"s, today their BP is   BP Readings from Last 3 Encounters:  08/28/20 136/74  05/04/20 120/82  04/16/20 (!) 156/90    She does not workout, she is active with her dog. She denies chest pain, shortness of breath, dizziness.  BMI is Body mass index is 28.78 kg/m., she is working on diet and exercise. Wt Readings from Last 3 Encounters:  10/15/20 154 lb 12.8 oz (70.2 kg)  08/28/20 158 lb (71.7 kg)  05/04/20 157 lb (71.2 kg)   Currently on levothyroxine 125 mcg daily 5 days a week and 1/2 pill 2 days a week for hypothyroidism. Last tsh was Lab Results  Component Value Date   TSH 0.02 (L) 03/24/2020    Currently on no medication for vaginal atrophy, will continue to monitor symptoms but has had  no further urinary issues.   She is not on cholesterol medication. Her cholesterol is not at goal. Pt refuses cholesterol medication. The cholesterol last visit was:   Lab Results  Component Value Date   CHOL 195 03/24/2020   HDL 49 (L) 03/24/2020   LDLCALC 105 (H) 03/24/2020   LDLDIRECT 138.2 04/25/2008   TRIG 287 (H) 03/24/2020   CHOLHDL 4.0 03/24/2020   She is on the adderall, 1/2 tablet-1 tablet a day for ADD, has to use mainly when she drives.  She has chronic joint pain bilateral feet and left knee, on meloxicam 2 days a week. Takes prilosec as needed for GERD.     Last A1C in the office was:  Lab Results  Component Value Date   HGBA1C 5.7 (H) 03/24/2020   Last GFR: Lab Results  Component Value Date   GFRNONAA 81 03/24/2020   .  Patient is on Vitamin D supplement.   Lab Results  Component Value Date   VD25OH 50 03/24/2020      Medication Review:  Current Outpatient Medications (Endocrine & Metabolic):    levothyroxine (SYNTHROID) 125 MCG tablet, TAKE 1 TABLET DAILY ON AN EMPTY STOMACH WITH ONLY WATER FOR 30 MINUTES & NO ANTACID MEDS, CALCIUM OR MAGNESIUM FOR 4 HOURS & AVOID BIOTIN- generic is okay (Patient taking differently: TAKE 1 TABLET DAILY ON AN EMPTY STOMACH WITH ONLY WATER FOR 30 MINUTES & NO ANTACID MEDS, CALCIUM OR MAGNESIUM FOR 4 HOURS & AVOID BIOTIN- generic is okay Taking 1 tab MWF and a 1/2 the other days 10/15/20)  Current Outpatient Medications (Cardiovascular):    benazepril-hydrochlorthiazide (LOTENSIN HCT) 20-12.5 MG tablet, Take 1 tablet by mouth daily.   EPINEPHrine 0.15 MG/0.15ML IJ injection, Inject 0.15 mLs (0.15 mg total) into the muscle as needed for anaphylaxis.  Current Outpatient Medications (Respiratory):    cetirizine (ZYRTEC) 10 MG tablet, 1 tablet  Current Outpatient Medications (Analgesics):    aspirin 81 MG chewable tablet, 1 tablet   meloxicam (MOBIC) 15 MG tablet, TAKE 1/2 TO 1 TABLET DAILY WITH FOOD FOR PAIN &  INFLAMMATION  Current Outpatient Medications (Hematological):    Cyanocobalamin (B-12) 1000 MCG TABS, Take by mouth.  Current Outpatient Medications (Other):    ALPRAZolam (XANAX) 1 MG tablet, TAKE 1/2 TO 1 TABLET BY MOUTH AS NEEDED FOR SLEEP/ ANXIETY AVOID DAILY USE DUE TO ADDICTIVE POTENTIAL LIMIT TO LESS THAN 5 DAYS PER WEEK   amphetamine-dextroamphetamine (ADDERALL) 20 MG tablet, Take 1/2 to 1 tablet daily if needed for ADD   busPIRone (BUSPAR) 10 MG tablet, Take 1 tablet (10 mg total) by mouth 3 (three) times daily.   clobetasol (TEMOVATE) 0.05 % external solution, Apply topically.   ketoconazole (NIZORAL) 2 % cream, APPLY TO AFFECTED AREA(S) TWO TIMES A DAY  lidocaine (LIDODERM) 5 %, Place 1 patch onto the skin daily. Remove & Discard patch within 12 hours or as directed by MD   meclizine (ANTIVERT) 25 MG tablet, 1/2-1 pill up to 3 times daily for motion sickness/dizziness (Patient taking differently: as needed. 1/2-1 pill up to 3 times daily for motion sickness/dizziness)   omeprazole (PRILOSEC) 40 MG capsule, TAKE ONE CAPSULE BY MOUTH DAILY TO PREVENT HEARTBURN AND INDIGESTION   ondansetron (ZOFRAN) 4 MG tablet, Take by mouth.   SYRINGE/NEEDLE, DISP, 1 ML (B-D SYRINGE/NEEDLE 1CC/25GX5/8) 25G X 5/8" 1 ML MISC, 1 SQ injection once weekly   vitamin C (ASCORBIC ACID) 500 MG tablet, Take 1,200 mg by mouth daily.   Vitamin D, Ergocalciferol, (DRISDOL) 1.25 MG (50000 UNIT) CAPS capsule, Take 1 capsule 3 x /week on Mon Wed & Fri for Severe Vitamin D Deficiency   polyethylene glycol-electrolytes (NULYTELY) 420 g solution, MIX AND DRINK AS DIRECTED   Allergies  Allergen Reactions   Levofloxacin     Neck sweling/difficult breathing   Cefprozil    Codeine     REACTION: vomiting   Hydrocodone-Acetaminophen    Morphine    Nitrofuran Derivatives Itching   Pravastatin Swelling   Lexapro [Escitalopram Oxalate] Other (See Comments)    Tapping/abnormal movements    Current Problems  (verified) 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   Dysphagia 04/21/2020   Generalized abdominal pain 04/21/2020   Hematochezia 04/21/2020   Left lower quadrant pain 04/21/2020   Melena 04/21/2020   Personal history of colonic polyps 04/21/2020   Rectal bleeding 04/21/2020   Urticaria 04/21/2020   B12 deficiency 01/31/2019   Medication management 06/23/2014   Abnormal glucose 03/27/2014   Vaginal atrophy 03/27/2014   Hyperlipidemia    Hypertension    Anxiety    GERD (gastroesophageal reflux disease)    ADD (attention deficit disorder)    Depression    Vitamin D deficiency    Hypothyroidism 04/25/2008   Iron deficiency anemia 04/25/2008   Obstructive sleep apnea 11/16/2007   Allergic rhinitis 11/16/2007   Headache 11/16/2007   PULMONARY EMBOLISM, HX OF 11/16/2007    Screening Tests Immunization History  Administered Date(s) Administered   Fluad Quad(high Dose 65+) 10/28/2018   Influenza Whole 11/22/2007   Influenza, High Dose Seasonal PF 11/10/2019   Influenza-Unspecified 10/28/2016   Moderna SARS-COV2 Booster Vaccination 04/06/2019, 05/04/2019   PFIZER(Purple Top)SARS-COV-2 Vaccination 01/30/2020   Pneumococcal Conjugate-13 05/02/2017   Pneumococcal Polysaccharide-23 10/11/2018   Td 04/25/2008, 05/16/2018   Varicella Zoster Immune Globulin 12/31/2014   Preventative care: Last colonoscopy: Dr. 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  Last pap smear/pelvic exam: unknown DEXA: 07/2018 CT AB 09/2017 US soft tissue head 2018 MRI lumbar 2010  Prior vaccinations: TD or Tdap: 05/16/2018  Influenza: 11/10/19 Pneumococcal: 2020 Prevnar13: 2019 Shingles/Zostavax: 2016 Shingrix: Strongly  encouraged COVID : 2 + 1 booster  Names of Other Physician/Practitioners you currently use: 1. [facility] here for primary care 2. [facility], eye doctor, last visit Jan 2022 3. Dr. Richardine Service, dentist, last visit yearly 2022 Patient Care Team: Lucky Cowboy, MD as PCP - General (Internal Medicine)  SURGICAL HISTORY She  has a past surgical history that includes Foot surgery (Right); Cesarean section; Tonsillectomy (  2002); Esophagogastroduodenoscopy (N/A, 09/02/2015); and IM nailing tibia (Left, 01/09/2001). FAMILY HISTORY Her family history includes Cancer (age of onset: 26) in her mother; Heart disease in her father; Hypertension in her father. SOCIAL HISTORY She  reports that she has never smoked. She has never used smokeless tobacco. She reports current alcohol use. She reports that she does not use drugs.   Review of Systems  Constitutional: Negative.  Negative for chills, fever and weight loss.  HENT:  Negative for congestion, ear pain, hearing loss and sore throat.   Eyes: Negative.  Negative for blurred vision and double vision.  Respiratory: Negative.  Negative for cough and shortness of breath.   Cardiovascular: Negative.  Negative for chest pain, palpitations, orthopnea and leg swelling.  Gastrointestinal:  Positive for heartburn. Negative for abdominal pain, constipation, diarrhea, nausea and vomiting.       RLQ abdominal pain and bloating  Genitourinary: Negative.   Musculoskeletal:  Negative for back pain, falls, joint pain and myalgias.  Skin: Negative.  Negative for rash.  Neurological:  Negative for dizziness, tingling, tremors, loss of consciousness and headaches.  Psychiatric/Behavioral:  Negative for depression, memory loss and suicidal ideas. The patient is nervous/anxious.    Objective:     Today's Vitals   10/15/20 1109  Pulse: 79  Temp: (!) 97.5 F (36.4 C)  SpO2: 94%  Weight: 154 lb 12.8 oz (70.2 kg)    Body mass index is 28.78 kg/m.  General appearance: alert, no distress, WD/WN, female HEENT: normocephalic, sclerae anicteric, TMs pearly on right ear, nares patent, no discharge or erythema, pharynx normal.  Oral cavity: MMM, no lesions, scalloped tongue Neck: supple, no lymphadenopathy, no thyromegaly, no masses Heart: RRR, normal S1, S2 prominent S2, no murmurs Lungs: CTA bilaterally, no wheezes, rhonchi, or rales Abdomen: +bs, soft, nMILD TENDERNESS rlqnon distended, no masses, no hepatomegaly, no splenomegaly Musculoskeletal: nontender, no swelling, Negative straight leg, + right SI pain.  Extremities: no edema, no cyanosis, no clubbing Pulses: 2+ symmetric, upper and lower extremities, normal cap refill Neurological: alert, oriented x 3, CN2-12 intact, strength normal upper extremities and lower extremities, sensation normal throughout, DTRs 2+ throughout, no cerebellar signs, gait normal Psychiatric: normal affect, behavior normal, pleasant   Medicare Attestation I have personally reviewed: The patient's medical and social history Their use of alcohol, tobacco or illicit drugs Their current medications and supplements The patient's functional ability including ADLs,fall risks, home safety risks, cognitive, and hearing and visual impairment Diet and physical activities Evidence for depression or mood disorders  The patient's weight, height, BMI, and visual acuity have been recorded in the chart.  I have made referrals, counseling, and provided education to the patient based on review of the above and I have provided the patient with a written personalized care plan for preventive services.    Revonda Humphrey ANP-C  [facility]  10/15/2020

## 2020-10-15 ENCOUNTER — Ambulatory Visit (INDEPENDENT_AMBULATORY_CARE_PROVIDER_SITE_OTHER): Payer: Medicare Other | Admitting: Nurse Practitioner

## 2020-10-15 ENCOUNTER — Encounter: Payer: Self-pay | Admitting: Nurse Practitioner

## 2020-10-15 ENCOUNTER — Other Ambulatory Visit: Payer: Self-pay

## 2020-10-15 VITALS — HR 79 | Temp 97.5°F | Wt 154.8 lb

## 2020-10-15 DIAGNOSIS — K219 Gastro-esophageal reflux disease without esophagitis: Secondary | ICD-10-CM

## 2020-10-15 DIAGNOSIS — Z0000 Encounter for general adult medical examination without abnormal findings: Secondary | ICD-10-CM

## 2020-10-15 DIAGNOSIS — E785 Hyperlipidemia, unspecified: Secondary | ICD-10-CM

## 2020-10-15 DIAGNOSIS — N952 Postmenopausal atrophic vaginitis: Secondary | ICD-10-CM

## 2020-10-15 DIAGNOSIS — Z0001 Encounter for general adult medical examination with abnormal findings: Secondary | ICD-10-CM | POA: Diagnosis not present

## 2020-10-15 DIAGNOSIS — R7309 Other abnormal glucose: Secondary | ICD-10-CM

## 2020-10-15 DIAGNOSIS — E559 Vitamin D deficiency, unspecified: Secondary | ICD-10-CM

## 2020-10-15 DIAGNOSIS — R1031 Right lower quadrant pain: Secondary | ICD-10-CM

## 2020-10-15 DIAGNOSIS — F988 Other specified behavioral and emotional disorders with onset usually occurring in childhood and adolescence: Secondary | ICD-10-CM | POA: Diagnosis not present

## 2020-10-15 DIAGNOSIS — D509 Iron deficiency anemia, unspecified: Secondary | ICD-10-CM

## 2020-10-15 DIAGNOSIS — E039 Hypothyroidism, unspecified: Secondary | ICD-10-CM | POA: Diagnosis not present

## 2020-10-15 DIAGNOSIS — Z1389 Encounter for screening for other disorder: Secondary | ICD-10-CM | POA: Diagnosis not present

## 2020-10-15 DIAGNOSIS — F3342 Major depressive disorder, recurrent, in full remission: Secondary | ICD-10-CM | POA: Diagnosis not present

## 2020-10-15 DIAGNOSIS — F419 Anxiety disorder, unspecified: Secondary | ICD-10-CM

## 2020-10-15 DIAGNOSIS — R6889 Other general symptoms and signs: Secondary | ICD-10-CM

## 2020-10-15 DIAGNOSIS — I10 Essential (primary) hypertension: Secondary | ICD-10-CM

## 2020-10-15 DIAGNOSIS — G4733 Obstructive sleep apnea (adult) (pediatric): Secondary | ICD-10-CM | POA: Diagnosis not present

## 2020-10-15 MED ORDER — BENAZEPRIL-HYDROCHLOROTHIAZIDE 20-12.5 MG PO TABS: 1.0000 | ORAL_TABLET | Freq: Every day | ORAL | 11 refills | Status: DC

## 2020-10-15 MED ORDER — VITAMIN D (ERGOCALCIFEROL) 1.25 MG (50000 UNIT) PO CAPS: ORAL_CAPSULE | ORAL | 3 refills | Status: DC

## 2020-10-15 MED ORDER — BUSPIRONE HCL 10 MG PO TABS: 10.0000 mg | ORAL_TABLET | Freq: Three times a day (TID) | ORAL | 3 refills | Status: DC

## 2020-10-15 NOTE — Patient Instructions (Signed)

## 2020-10-16 ENCOUNTER — Ambulatory Visit: Payer: Medicare Other | Admitting: Adult Health

## 2020-10-16 LAB — URINALYSIS, ROUTINE W REFLEX MICROSCOPIC
Bilirubin Urine: NEGATIVE
Glucose, UA: NEGATIVE
Hgb urine dipstick: NEGATIVE
Ketones, ur: NEGATIVE
Leukocytes,Ua: NEGATIVE
Nitrite: NEGATIVE
Protein, ur: NEGATIVE
Specific Gravity, Urine: 1.012 (ref 1.001–1.035)
pH: 6.5 (ref 5.0–8.0)

## 2020-10-16 LAB — COMPLETE METABOLIC PANEL WITH GFR
AG Ratio: 1.9 (calc) (ref 1.0–2.5)
ALT: 22 U/L (ref 6–29)
AST: 20 U/L (ref 10–35)
Albumin: 4.4 g/dL (ref 3.6–5.1)
Alkaline phosphatase (APISO): 81 U/L (ref 37–153)
BUN: 12 mg/dL (ref 7–25)
CO2: 28 mmol/L (ref 20–32)
Calcium: 9.9 mg/dL (ref 8.6–10.4)
Chloride: 104 mmol/L (ref 98–110)
Creat: 0.8 mg/dL (ref 0.50–1.05)
Globulin: 2.3 g/dL (calc) (ref 1.9–3.7)
Glucose, Bld: 85 mg/dL (ref 65–99)
Potassium: 4.2 mmol/L (ref 3.5–5.3)
Sodium: 140 mmol/L (ref 135–146)
Total Bilirubin: 0.6 mg/dL (ref 0.2–1.2)
Total Protein: 6.7 g/dL (ref 6.1–8.1)
eGFR: 80 mL/min/{1.73_m2} (ref >=60)

## 2020-10-16 LAB — CBC WITH DIFFERENTIAL/PLATELET
Absolute Monocytes: 469 cells/uL (ref 200–950)
Basophils Absolute: 43 cells/uL (ref 0–200)
Basophils Relative: 0.6 %
Eosinophils Absolute: 220 cells/uL (ref 15–500)
Eosinophils Relative: 3.1 %
HCT: 47.6 % — ABNORMAL HIGH (ref 35.0–45.0)
Hemoglobin: 15.8 g/dL — ABNORMAL HIGH (ref 11.7–15.5)
Lymphs Abs: 2102 cells/uL (ref 850–3900)
MCH: 29.1 pg (ref 27.0–33.0)
MCHC: 33.2 g/dL (ref 32.0–36.0)
MCV: 87.7 fL (ref 80.0–100.0)
MPV: 10.3 fL (ref 7.5–12.5)
Monocytes Relative: 6.6 %
Neutro Abs: 4267 cells/uL (ref 1500–7800)
Neutrophils Relative %: 60.1 %
Platelets: 357 10*3/uL (ref 140–400)
RBC: 5.43 10*6/uL — ABNORMAL HIGH (ref 3.80–5.10)
RDW: 13.1 % (ref 11.0–15.0)
Total Lymphocyte: 29.6 %
WBC: 7.1 10*3/uL (ref 3.8–10.8)

## 2020-10-16 LAB — CELIAC DISEASE COMPREHENSIVE PANEL WITH REFLEXES
(tTG) Ab, IgA: <1 U/mL
Immunoglobulin A: 152 mg/dL (ref 70–320)

## 2020-10-16 LAB — MICROALBUMIN / CREATININE URINE RATIO
Creatinine, Urine: 68 mg/dL (ref 20–275)
Microalb Creat Ratio: 4 mcg/mg creat (ref <30)
Microalb, Ur: 0.3 mg/dL

## 2020-10-16 LAB — LIPID PANEL
Cholesterol: 226 mg/dL — ABNORMAL HIGH (ref <200)
HDL: 52 mg/dL (ref >=50)
LDL Cholesterol (Calc): 138 mg/dL (calc) — ABNORMAL HIGH
Non-HDL Cholesterol (Calc): 174 mg/dL (calc) — ABNORMAL HIGH (ref <130)
Total CHOL/HDL Ratio: 4.3 (calc) (ref <5.0)
Triglycerides: 219 mg/dL — ABNORMAL HIGH (ref <150)

## 2020-10-16 LAB — HEMOGLOBIN A1C
Hgb A1c MFr Bld: 5.6 % of total Hgb (ref <5.7)
Mean Plasma Glucose: 114 mg/dL
eAG (mmol/L): 6.3 mmol/L

## 2020-10-16 LAB — TSH: TSH: 0.63 mIU/L (ref 0.40–4.50)

## 2020-10-27 ENCOUNTER — Ambulatory Visit
Admission: RE | Admit: 2020-10-27 | Discharge: 2020-10-27 | Disposition: A | Discharge status: Home / Self Care | Payer: Medicare Other | Source: Ambulatory Visit | Attending: Nurse Practitioner | Admitting: Nurse Practitioner

## 2020-10-27 DIAGNOSIS — I700 Atherosclerosis of aorta: Secondary | ICD-10-CM | POA: Diagnosis not present

## 2020-10-27 DIAGNOSIS — R1031 Right lower quadrant pain: Secondary | ICD-10-CM

## 2020-10-27 MED ORDER — IOPAMIDOL (ISOVUE-300) INJECTION 61%
100.0000 mL | Freq: Once | INTRAVENOUS | Status: AC | PRN
  Administered 2020-10-27: 100 mL via INTRAVENOUS

## 2020-10-30 ENCOUNTER — Ambulatory Visit (INDEPENDENT_AMBULATORY_CARE_PROVIDER_SITE_OTHER): Payer: Medicare Other

## 2020-10-30 ENCOUNTER — Ambulatory Visit (INDEPENDENT_AMBULATORY_CARE_PROVIDER_SITE_OTHER): Payer: Medicare Other | Admitting: Podiatrist

## 2020-10-30 ENCOUNTER — Other Ambulatory Visit: Payer: Self-pay

## 2020-10-30 ENCOUNTER — Encounter: Payer: Self-pay | Admitting: Podiatrist

## 2020-10-30 DIAGNOSIS — M7752 Other enthesopathy of left foot: Secondary | ICD-10-CM

## 2020-10-30 DIAGNOSIS — M7750 Other enthesopathy of unspecified foot: Secondary | ICD-10-CM | POA: Diagnosis not present

## 2020-10-30 DIAGNOSIS — M79671 Pain in right foot: Secondary | ICD-10-CM | POA: Diagnosis not present

## 2020-10-30 DIAGNOSIS — M79672 Pain in left foot: Secondary | ICD-10-CM

## 2020-10-30 DIAGNOSIS — M778 Other enthesopathies, not elsewhere classified: Secondary | ICD-10-CM | POA: Diagnosis not present

## 2020-10-30 NOTE — Patient Instructions (Signed)
Call if you would like me to order you the compounded cream for your foot.

## 2020-11-03 DIAGNOSIS — M778 Other enthesopathies, not elsewhere classified: Secondary | ICD-10-CM

## 2020-11-03 DIAGNOSIS — M79672 Pain in left foot: Secondary | ICD-10-CM | POA: Diagnosis not present

## 2020-11-03 DIAGNOSIS — M79671 Pain in right foot: Secondary | ICD-10-CM | POA: Diagnosis not present

## 2020-11-03 DIAGNOSIS — M7750 Other enthesopathy of unspecified foot: Secondary | ICD-10-CM | POA: Diagnosis not present

## 2020-11-03 MED ORDER — TRIAMCINOLONE ACETONIDE 10 MG/ML IJ SUSP
10.0000 mg | Freq: Once | INTRAMUSCULAR | Status: AC
  Administered 2020-11-03: 10 mg

## 2020-11-03 NOTE — Progress Notes (Signed)
HPI: Patient is 70 y.o. female who presents today for pain in the left foot of which she is concerned is a bone spur.  She relates the pain as sharp and shooting at times.  She has had this pain for a while now-  in the past she did receive an injection in the area and related this helped her symptoms for about a year.  She also relates she has had foot surgery on the right foot in the past and does not feel it helped.    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   Dysphagia 04/21/2020   Generalized abdominal pain 04/21/2020   Hematochezia 04/21/2020   Left lower quadrant pain 04/21/2020   Personal history of colonic polyps 04/21/2020   Urticaria 04/21/2020   B12 deficiency 01/31/2019   Medication management 06/23/2014   Abnormal glucose 03/27/2014   Vaginal atrophy 03/27/2014   Hyperlipidemia    Hypertension    Anxiety    GERD (gastroesophageal reflux disease)    ADD (attention deficit disorder)    Depression    Vitamin D deficiency    Hypothyroidism 04/25/2008   Iron deficiency anemia 04/25/2008   Obstructive sleep apnea 11/16/2007   Allergic rhinitis 11/16/2007   Headache 11/16/2007   PULMONARY EMBOLISM, HX OF 11/16/2007    Current Outpatient Medications on File Prior to Visit  Medication Sig Dispense Refill   ALPRAZolam (XANAX) 1 MG tablet TAKE 1/2 TO 1 TABLET BY MOUTH AS NEEDED FOR SLEEP/ ANXIETY AVOID DAILY USE DUE TO ADDICTIVE POTENTIAL LIMIT TO LESS THAN 5 DAYS PER WEEK 30 tablet 1   amphetamine-dextroamphetamine (ADDERALL) 20 MG tablet Take 1/2 to 1 tablet daily if needed for ADD 30 tablet 0   aspirin 81 MG chewable tablet 1 tablet     benazepril-hydrochlorthiazide (LOTENSIN HCT) 20-12.5 MG tablet Take 1 tablet by mouth daily. 30 tablet 11   busPIRone (BUSPAR) 10 MG tablet Take 1 tablet (10 mg total) by mouth 3 (three) times daily. 90 tablet 3   cetirizine (ZYRTEC) 10 MG tablet 1  tablet     clobetasol (TEMOVATE) 0.05 % external solution Apply topically.     Cyanocobalamin (B-12) 1000 MCG TABS Take by mouth.     EPINEPHrine 0.15 MG/0.15ML IJ injection Inject 0.15 mLs (0.15 mg total) into the muscle as needed for anaphylaxis. 1 Device 2   ketoconazole (NIZORAL) 2 % cream APPLY TO AFFECTED AREA(S) TWO TIMES A DAY 60 g 3   levothyroxine (SYNTHROID) 125 MCG tablet TAKE 1 TABLET DAILY ON AN EMPTY STOMACH WITH ONLY WATER FOR 30 MINUTES & NO ANTACID MEDS, CALCIUM OR MAGNESIUM FOR 4 HOURS & AVOID BIOTIN- generic is okay (Patient taking differently: TAKE 1 TABLET DAILY ON AN EMPTY STOMACH WITH ONLY WATER FOR 30 MINUTES & NO ANTACID MEDS, CALCIUM OR MAGNESIUM FOR 4 HOURS & AVOID BIOTIN- generic is okay Taking 1 tab MWF and a 1/2 the other days 10/15/20) 90 tablet 1   lidocaine (LIDODERM) 5 % Place 1 patch onto the skin daily. Remove & Discard patch within 12 hours or as directed by MD 30 patch 3   meclizine (ANTIVERT) 25 MG tablet 1/2-1 pill up to 3 times daily for motion sickness/dizziness (Patient taking differently: as needed. 1/2-1 pill up to 3 times daily for motion sickness/dizziness) 30 tablet 0   meloxicam (MOBIC) 15 MG tablet TAKE 1/2 TO 1 TABLET DAILY WITH FOOD FOR PAIN &  INFLAMMATION 90 tablet 1   omeprazole (PRILOSEC) 40 MG capsule TAKE ONE CAPSULE BY MOUTH DAILY TO PREVENT HEARTBURN AND INDIGESTION 90 capsule 1   ondansetron (ZOFRAN) 4 MG tablet Take by mouth.     polyethylene glycol-electrolytes (NULYTELY) 420 g solution MIX AND DRINK AS DIRECTED     SYRINGE/NEEDLE, DISP, 1 ML (B-D SYRINGE/NEEDLE 1CC/25GX5/8) 25G X 5/8" 1 ML MISC 1 SQ injection once weekly 100 each 1   vitamin C (ASCORBIC ACID) 500 MG tablet Take 1,200 mg by mouth daily.     Vitamin D, Ergocalciferol, (DRISDOL) 1.25 MG (50000 UNIT) CAPS capsule Take 1 capsule 3 x /week on Mon Wed & Fri for Severe Vitamin D Deficiency 36 capsule 3   No current facility-administered medications on file prior to visit.     Allergies  Allergen Reactions   Levofloxacin     Neck sweling/difficult breathing   Cefprozil    Codeine     REACTION: vomiting   Hydrocodone-Acetaminophen    Morphine    Nitrofuran Derivatives Itching   Pravastatin Swelling   Lexapro [Escitalopram Oxalate] Other (See Comments)    Tapping/abnormal movements    Review of Systems No fevers, chills, nausea, muscle aches, no difficulty breathing, no calf pain, no chest pain or shortness of breath.   Physical Exam  GENERAL APPEARANCE: Alert, conversant. Appropriately groomed. No acute distress.   VASCULAR: Pedal pulses palpable DP and PT bilateral.  Capillary refill time is immediate to all digits,  Proximal to distal cooling it warm to warm.  Digital perfusion adequate.   NEUROLOGIC: sensation is intact to 5.07 monofilament at 5/5 sites bilateral.  Light touch is intact bilateral, vibratory sensation intact bilateral  MUSCULOSKELETAL: acceptable muscle strength, tone and stability bilateral.  High arch foot type bilateral is noted.  Pain on palpation first metatarsal base/ medial cuneiform area is noted.  It also appears to have a small bone spur in this area clinically and on xray as well.    DERMATOLOGIC: skin is warm, supple, and dry.  No open lesions noted.  No rash, no pre ulcerative lesions. Digital nails are asymptomatic.    Xray evaluation.  Right foot-  high arch foot type is noted.  Single screw in place across the Calcaneal cuboid joint- joint space still present across the joint.  No acute osseous abnormalities present.  Left foot-  high arch foot type is noted.  arthritic changes seen at the first metatarsal base/ medial cuneiform articulation on the lateral view.  No acute osseous abnormalities seen.  Tibial rod noted.   Assessment   1. Right foot pain   2. Left foot pain   3. Bone spur of foot   4. Capsulitis of left foot      Plan  Discussed xray and exam findings with the patient. Discussed the  potential risks and benefits of a steroid injection into the area of discomfort on the left foot.  The patient agreed to try an injection to see if it would improve her pain.  I prepped the skin with alcohol and anesthetized the skin with lidocaine plain.  After anesthesia was achieved,  I then infiltrated 10mg  kenalog and marcaine plain into the first metatarsal base area in the area of maximal tenderness.  The patient tolerated this well.  I recommended she ice the area today as she will likely bruise from the injection.  She will see how the foot responds and if the pain has not resolved, she will call.

## 2020-11-04 ENCOUNTER — Other Ambulatory Visit: Payer: Self-pay | Admitting: Nurse Practitioner

## 2020-11-04 ENCOUNTER — Telehealth: Payer: Self-pay

## 2020-11-04 DIAGNOSIS — F988 Other specified behavioral and emotional disorders with onset usually occurring in childhood and adolescence: Secondary | ICD-10-CM

## 2020-11-04 MED ORDER — AMPHETAMINE-DEXTROAMPHETAMINE 20 MG PO TABS: ORAL_TABLET | ORAL | 0 refills | Status: DC

## 2020-11-04 NOTE — Telephone Encounter (Signed)
Patient is needing a refill of Adderall.

## 2020-11-04 NOTE — Telephone Encounter (Signed)
Script has been refilled.  

## 2020-11-09 ENCOUNTER — Other Ambulatory Visit: Payer: Self-pay | Admitting: Internal Medicine

## 2020-11-09 ENCOUNTER — Other Ambulatory Visit: Payer: Self-pay | Admitting: Nurse Practitioner

## 2020-11-09 DIAGNOSIS — F419 Anxiety disorder, unspecified: Secondary | ICD-10-CM

## 2020-11-09 MED ORDER — ALPRAZOLAM 1 MG PO TABS: ORAL_TABLET | ORAL | 0 refills | Status: DC

## 2020-11-11 ENCOUNTER — Other Ambulatory Visit: Payer: Self-pay | Admitting: Podiatrist

## 2020-11-11 DIAGNOSIS — M7752 Other enthesopathy of left foot: Secondary | ICD-10-CM

## 2020-11-12 ENCOUNTER — Other Ambulatory Visit: Payer: Self-pay | Admitting: Podiatrist

## 2020-11-12 DIAGNOSIS — M778 Other enthesopathies, not elsewhere classified: Secondary | ICD-10-CM

## 2020-11-19 ENCOUNTER — Other Ambulatory Visit: Payer: Self-pay

## 2020-11-19 ENCOUNTER — Other Ambulatory Visit: Payer: Self-pay | Admitting: Internal Medicine

## 2020-11-19 ENCOUNTER — Ambulatory Visit
Admission: RE | Admit: 2020-11-19 | Discharge: 2020-11-19 | Disposition: A | Discharge status: Home / Self Care | Payer: Medicare Other | Source: Ambulatory Visit | Attending: Internal Medicine | Admitting: Internal Medicine

## 2020-11-19 DIAGNOSIS — Z1231 Encounter for screening mammogram for malignant neoplasm of breast: Secondary | ICD-10-CM

## 2020-11-23 DIAGNOSIS — Z23 Encounter for immunization: Secondary | ICD-10-CM | POA: Diagnosis not present

## 2020-12-10 ENCOUNTER — Other Ambulatory Visit: Payer: Self-pay | Admitting: Nurse Practitioner

## 2020-12-10 ENCOUNTER — Telehealth: Payer: Self-pay

## 2020-12-10 DIAGNOSIS — F988 Other specified behavioral and emotional disorders with onset usually occurring in childhood and adolescence: Secondary | ICD-10-CM

## 2020-12-10 DIAGNOSIS — F419 Anxiety disorder, unspecified: Secondary | ICD-10-CM

## 2020-12-10 MED ORDER — ALPRAZOLAM 1 MG PO TABS: ORAL_TABLET | ORAL | 0 refills | Status: DC

## 2020-12-10 MED ORDER — AMPHETAMINE-DEXTROAMPHETAMINE 20 MG PO TABS: ORAL_TABLET | ORAL | 0 refills | Status: DC

## 2020-12-10 NOTE — Telephone Encounter (Signed)
Patient needs a refill on Alprazolam 1mg  and Adderall 20mg 

## 2021-01-05 ENCOUNTER — Telehealth: Payer: Self-pay

## 2021-01-05 ENCOUNTER — Other Ambulatory Visit: Payer: Self-pay | Admitting: Nurse Practitioner

## 2021-01-05 DIAGNOSIS — F419 Anxiety disorder, unspecified: Secondary | ICD-10-CM

## 2021-01-05 MED ORDER — ALPRAZOLAM 1 MG PO TABS: ORAL_TABLET | ORAL | 0 refills | Status: DC

## 2021-01-05 NOTE — Telephone Encounter (Signed)
I sent the script in

## 2021-01-05 NOTE — Progress Notes (Signed)
Pt got Adderall script 5 days early because she is going on vacation

## 2021-01-05 NOTE — Telephone Encounter (Signed)
Patient is requesting a refill on her Adderall 5 days early because of vacation.   Diana Wolfe on [facility]

## 2021-01-05 NOTE — Telephone Encounter (Signed)
Duplicate

## 2021-01-05 NOTE — Telephone Encounter (Signed)
I don't know if I routed this to you or not? If I did, disregard.    Patient is wanting a refill on adderall 5 days early because she is going out of town.

## 2021-01-09 ENCOUNTER — Encounter: Payer: Self-pay | Admitting: Nurse Practitioner

## 2021-01-11 ENCOUNTER — Other Ambulatory Visit: Payer: Self-pay | Admitting: Nurse Practitioner

## 2021-01-11 DIAGNOSIS — F988 Other specified behavioral and emotional disorders with onset usually occurring in childhood and adolescence: Secondary | ICD-10-CM

## 2021-01-11 MED ORDER — AMPHETAMINE-DEXTROAMPHETAMINE 20 MG PO TABS: ORAL_TABLET | ORAL | 0 refills | Status: DC

## 2021-01-15 ENCOUNTER — Encounter: Payer: Self-pay | Admitting: Nurse Practitioner

## 2021-02-03 NOTE — Progress Notes (Signed)
Assessment and Plan:    Diana Wolfe was seen today for cyst.  Diagnoses and all orders for this visit:  Posterior left knee pain -     [place] LT LOWER EXTREM LTD SOFT TISSUE NON VASCULAR; Future -     DG Knee Complete 4 Views Left; Future Will refer to ortho pending results Use Ibuprofen, heat/ice as needed  Attention deficit disorder, unspecified hyperactivity presence -     amphetamine-dextroamphetamine (ADDERALL) 20 MG tablet; Take 1/2 to 1 tablet daily if needed for ADD Continue medication as needed  Dyshidrotic eczema -     triamcinolone cream (KENALOG) 0.5 %; Apply 1 application topically 2 (two) times daily. Avoid having hands wet as much as possible     Further disposition pending results of labs. Discussed med's effects and SE's.   Over 30 minutes of exam, counseling, chart review, and critical decision making was performed.   Future Appointments  Date Time Provider Department Center  03/24/2021  3:00 PM Revonda Humphrey, NP [facility] None  10/15/2021 11:00 AM Revonda Humphrey, NP [facility] None    ------------------------------------------------------------------------------------------------------------------   HPI BP (!) 158/70    Pulse 92    Temp 97.7 F (36.5 C)    Wt 154 lb (69.9 kg)    SpO2 97%    BMI 28.63 kg/m  70 y.o.female presents for pain in left knee with a cyst.  She is having cramping in the left leg and has a knot behind left knee which is painful and feels several small knots up back of left thigh .   BMI is Body mass index is 28.63 kg/m., she has been working on diet and exercise. Wt Readings from Last 3 Encounters:  02/08/21 154 lb (69.9 kg)  10/15/20 154 lb 12.8 oz (70.2 kg)  08/28/20 158 lb (71.7 kg)   She is currently not on BP medication and BP's are controlled BP Readings from Last 3 Encounters:  02/08/21 (!) 158/70  08/28/20 136/74  05/04/20 120/82    Past Medical History:  Diagnosis Date   ADD (attention deficit disorder)    Anxiety     Depression    GERD (gastroesophageal reflux disease)    Hyperlipidemia    Hypertension    Hypothyroid    PONV (postoperative nausea and vomiting)    Vitamin D deficiency      Allergies  Allergen Reactions   Levofloxacin     Neck sweling/difficult breathing   Cefprozil    Codeine     REACTION: vomiting   Hydrocodone-Acetaminophen    Morphine    Nitrofuran Derivatives Itching   Pravastatin Swelling   Lexapro [Escitalopram Oxalate] Other (See Comments)    Tapping/abnormal movements    Current Outpatient Medications on File Prior to Visit  Medication Sig   ALPRAZolam (XANAX) 1 MG tablet TAKE 1/2 TO 1 TABLET BY MOUTH AS NEEDED FOR SLEEP/ ANXIETY AVOID DAILY USE DUE TO ADDICTIVE POTENTIAL LIMIT TO LESS THAN 5 DAYS PER WEEK   amphetamine-dextroamphetamine (ADDERALL) 20 MG tablet Take 1/2 to 1 tablet daily if needed for ADD   aspirin 81 MG chewable tablet 1 tablet   busPIRone (BUSPAR) 10 MG tablet Take 1 tablet (10 mg total) by mouth 3 (three) times daily.   cetirizine (ZYRTEC) 10 MG tablet 1 tablet   Cyanocobalamin (B-12) 1000 MCG TABS Take by mouth.   EPINEPHrine 0.15 MG/0.15ML IJ injection Inject 0.15 mLs (0.15 mg total) into the muscle as needed for anaphylaxis.   levothyroxine (SYNTHROID) 125 MCG tablet  TAKE 1 TABLET DAILY ON AN EMPTY STOMACH WITH ONLY WATER FOR 30 MINUTES & NO ANTACID MEDS, CALCIUM OR MAGNESIUM FOR 4 HOURS & AVOID BIOTIN- generic is okay (Patient taking differently: TAKE 1 TABLET DAILY ON AN EMPTY STOMACH WITH ONLY WATER FOR 30 MINUTES & NO ANTACID MEDS, CALCIUM OR MAGNESIUM FOR 4 HOURS & AVOID BIOTIN- generic is okay Taking 1 tab MWF and a 1/2 the other days 10/15/20)   lidocaine (LIDODERM) 5 % Place 1 patch onto the skin daily. Remove & Discard patch within 12 hours or as directed by MD   meloxicam (MOBIC) 15 MG tablet TAKE 1/2 TO 1 TABLET DAILY WITH FOOD FOR PAIN & INFLAMMATION   omeprazole (PRILOSEC) 40 MG capsule TAKE ONE CAPSULE BY MOUTH DAILY TO PREVENT  HEARTBURN AND INDIGESTION   vitamin C (ASCORBIC ACID) 500 MG tablet Take 1,200 mg by mouth daily.   Vitamin D, Ergocalciferol, (DRISDOL) 1.25 MG (50000 UNIT) CAPS capsule Take 1 capsule 3 x /week on Mon Wed & Fri for Severe Vitamin D Deficiency   benazepril-hydrochlorthiazide (LOTENSIN HCT) 20-12.5 MG tablet Take 1 tablet by mouth daily.   clobetasol (TEMOVATE) 0.05 % external solution Apply topically. (Patient not taking: Reported on 02/08/2021)   ketoconazole (NIZORAL) 2 % cream APPLY TO AFFECTED AREA(S) TWO TIMES A DAY (Patient not taking: Reported on 02/08/2021)   meclizine (ANTIVERT) 25 MG tablet 1/2-1 pill up to 3 times daily for motion sickness/dizziness (Patient not taking: Reported on 02/08/2021)   ondansetron (ZOFRAN) 4 MG tablet Take by mouth. (Patient not taking: Reported on 02/08/2021)   polyethylene glycol-electrolytes (NULYTELY) 420 g solution MIX AND DRINK AS DIRECTED (Patient not taking: Reported on 02/08/2021)   SYRINGE/NEEDLE, DISP, 1 ML (B-D SYRINGE/NEEDLE 1CC/25GX5/8) 25G X 5/8" 1 ML MISC 1 SQ injection once weekly   No current facility-administered medications on file prior to visit.    ROS: all negative except above.   Physical Exam:  BP (!) 158/70    Pulse 92    Temp 97.7 F (36.5 C)    Wt 154 lb (69.9 kg)    SpO2 97%    BMI 28.63 kg/m   General Appearance: Well nourished, in no apparent distress. Eyes: PERRLA, EOMs, conjunctiva no swelling or erythema Sinuses: No Frontal/maxillary tenderness ENT/Mouth: Ext aud canals clear, TMs without erythema, bulging. No erythema, swelling, or exudate on post pharynx.  Tonsils not swollen or erythematous. Hearing normal.  Neck: Supple, thyroid normal.  Respiratory: Respiratory effort normal, BS equal bilaterally without rales, rhonchi, wheezing or stridor.  Cardio: RRR with no MRGs. Brisk peripheral pulses without edema.  Abdomen: Soft, + BS.  Non tender, no guarding, rebound, hernias, masses. Lymphatics: Non tender without  lymphadenopathy.  Musculoskeletal: Full ROM, 5/5 strength, normal gait. 2 cm soft tender area behind left knee. Multiple small tender cysts noted on thigh(possible lipoma v. Scar tissue) Skin: Warm, dry without rashes, lesions, ecchymosis. Seborrheic keratosis on back is much smaller Neuro: Cranial nerves intact. Normal muscle tone, no cerebellar symptoms. Sensation intact.  Psych: Awake and oriented X 3, normal affect, Insight and Judgment appropriate.     Revonda Humphrey, NP 9:43 AM [facility]

## 2021-02-08 ENCOUNTER — Encounter: Payer: Self-pay | Admitting: Nurse Practitioner

## 2021-02-08 ENCOUNTER — Ambulatory Visit (INDEPENDENT_AMBULATORY_CARE_PROVIDER_SITE_OTHER): Payer: Medicare Other | Admitting: Nurse Practitioner

## 2021-02-08 ENCOUNTER — Other Ambulatory Visit: Payer: Self-pay

## 2021-02-08 VITALS — BP 158/70 | HR 92 | Temp 97.7°F | Wt 154.0 lb

## 2021-02-08 DIAGNOSIS — M25562 Pain in left knee: Secondary | ICD-10-CM | POA: Diagnosis not present

## 2021-02-08 DIAGNOSIS — L301 Dyshidrosis [pompholyx]: Secondary | ICD-10-CM | POA: Diagnosis not present

## 2021-02-08 DIAGNOSIS — F988 Other specified behavioral and emotional disorders with onset usually occurring in childhood and adolescence: Secondary | ICD-10-CM | POA: Diagnosis not present

## 2021-02-08 MED ORDER — AMPHETAMINE-DEXTROAMPHETAMINE 20 MG PO TABS: ORAL_TABLET | ORAL | 0 refills | Status: DC

## 2021-02-08 MED ORDER — TRIAMCINOLONE ACETONIDE 0.5 % EX CREA: 1.0000 "application " | TOPICAL_CREAM | Freq: Two times a day (BID) | CUTANEOUS | 2 refills | Status: DC

## 2021-02-08 NOTE — Patient Instructions (Signed)
YOU CAN CALL TO MAKE AN ULTRASOUND..  I have put in an order for an ultrasound for you to have You can set them up at your convenience by calling this number [phone] You will likely have the ultrasound at [address]  If you have any issues call our office and we will set this up for you.    Baker Cyst  A Baker cyst, also called a popliteal cyst, is a growth that forms at the back of the knee. The cyst forms when the fluid-filled sac (bursa) that cushions the knee joint becomes enlarged. What are the causes? In most cases, a Baker cyst results from another knee problem that causes swelling inside the knee. This makes the fluid inside the knee joint (synovial fluid) flow into the bursa behind the knee, causing the bursa to enlarge. What increases the risk? You may be more likely to develop a Baker cyst if you already have a knee problem, such as: A tear in cartilage that cushions the knee joint (meniscal tear). A tear in the tissues that connect the bones of the knee joint (ligament tear). Knee swelling from osteoarthritis, rheumatoid arthritis, or gout. What are the signs or symptoms? The main symptom of this condition is a lump behind the knee. This may be the only symptom of the condition. The lump may be painful, especially when the knee is straightened. If the lump is painful, the pain may come and go. Theknee may also be stiff. Symptoms may quickly get more severe if the cyst breaks open (ruptures). If the cyst ruptures, you may feel the following in your knee and calf: Sudden or worsening pain. Swelling. Bruising. Redness in the calf. A Baker cyst does not always cause symptoms. How is this diagnosed? This condition may be diagnosed based on your symptoms and medical history. Your health care provider will also do a physical exam. This may include: Feeling the cyst to check whether it is tender. Checking your knee for signs of another knee condition that  causes swelling. You may have imaging tests, such as: X-rays. MRI. Ultrasound. How is this treated? A Baker cyst that is not painful may go away without treatment. If the cyst gets large or painful, it will likely get better if the underlying knee problemis treated. If needed, treatment for a Baker cyst may include: Resting. Keeping weight off of the knee. This means not leaning on the knee to support your body weight. Taking NSAIDs, such as ibuprofen, to reduce pain and swelling. Having a procedure to drain the fluid from the cyst with a needle (aspiration). You may also get an injection of a medicine that reduces swelling (steroid). Having surgery. This may be needed if other treatments do not work. This usually involves correcting knee damage and removing the cyst. Follow these instructions at home:  Activity Rest as told by your health care provider. Avoid activities that make pain or swelling worse. Return to your normal activities as told by your health care provider. Ask your health care provider what activities are safe for you. Do not use the injured limb to support your body weight until your health care provider says that you can. Use crutches as told by your health care provider. General instructions Take over-the-counter and prescription medicines only as told by your health care provider. Keep all follow-up visits as told by your health care provider. This is important. Contact a health care provider if: You have knee pain,   stiffness, or swelling that does not get better. Get help right away if: You have sudden or worsening pain and swelling in your calf area. Summary A Baker cyst, also called a popliteal cyst, is a growth that forms at the back of the knee. In most cases, a Baker cyst results from another knee problem that causes swelling inside the knee. A Baker cyst that is not painful may go away without treatment. If needed, treatment for a Baker cyst may include  resting, keeping weight off of the knee, medicines, or draining fluid from the cyst. Surgery may be needed if other treatments are not effective. This information is not intended to replace advice given to you by your health care provider. Make sure you discuss any questions you have with your healthcare provider. Document Revised: 06/22/2018 Document Reviewed: 06/22/2018 Elsevier Patient Education  2022 [employer] Inc[employer]  

## 2021-02-09 ENCOUNTER — Ambulatory Visit
Admission: RE | Admit: 2021-02-09 | Discharge: 2021-02-09 | Disposition: A | Discharge status: Home / Self Care | Payer: Medicare Other | Source: Ambulatory Visit | Attending: Nurse Practitioner | Admitting: Nurse Practitioner

## 2021-02-09 DIAGNOSIS — M25562 Pain in left knee: Secondary | ICD-10-CM

## 2021-02-09 DIAGNOSIS — R2242 Localized swelling, mass and lump, left lower limb: Secondary | ICD-10-CM | POA: Diagnosis not present

## 2021-02-10 ENCOUNTER — Other Ambulatory Visit: Payer: Medicare Other

## 2021-02-10 ENCOUNTER — Other Ambulatory Visit: Payer: Self-pay | Admitting: Adult Health

## 2021-02-15 ENCOUNTER — Encounter: Payer: Self-pay | Admitting: Nurse Practitioner

## 2021-02-16 ENCOUNTER — Other Ambulatory Visit: Payer: Medicare Other

## 2021-03-10 ENCOUNTER — Encounter: Payer: Self-pay | Admitting: Nurse Practitioner

## 2021-03-10 ENCOUNTER — Other Ambulatory Visit: Payer: Self-pay | Admitting: Nurse Practitioner

## 2021-03-10 DIAGNOSIS — F419 Anxiety disorder, unspecified: Secondary | ICD-10-CM

## 2021-03-10 DIAGNOSIS — F988 Other specified behavioral and emotional disorders with onset usually occurring in childhood and adolescence: Secondary | ICD-10-CM

## 2021-03-10 MED ORDER — BUPROPION HCL ER (XL) 150 MG PO TB24: 150.0000 mg | ORAL_TABLET | ORAL | 2 refills | Status: DC

## 2021-03-10 MED ORDER — AMPHETAMINE-DEXTROAMPHETAMINE 20 MG PO TABS: ORAL_TABLET | ORAL | 0 refills | Status: DC

## 2021-03-15 ENCOUNTER — Other Ambulatory Visit: Payer: Self-pay | Admitting: Internal Medicine

## 2021-03-15 DIAGNOSIS — F419 Anxiety disorder, unspecified: Secondary | ICD-10-CM

## 2021-03-15 MED ORDER — ALPRAZOLAM 1 MG PO TABS: ORAL_TABLET | ORAL | 0 refills | Status: DC

## 2021-03-22 ENCOUNTER — Other Ambulatory Visit: Payer: Self-pay | Admitting: Adult Health Nurse Practitioner

## 2021-03-22 DIAGNOSIS — M542 Cervicalgia: Secondary | ICD-10-CM

## 2021-03-22 NOTE — Progress Notes (Signed)
COMPLETE PHYSICAL  Assessment:    Encounter for general adult medical examination with abnormal findings Yearly  Abnormal glucose Continue diet and exercise -     Hemoglobin A1c ([facility])  Obstructive sleep apnea Given numbers for dentist that do mouth piece  GERD Continue behavior modifications Continue Prilosec  Attention deficit disorder, unspecified hyperactivity presence Currently on Adderall but not helping as much anymore Discussed Vyvanse and pt may wish to consider once she finishes current script of Adderall  Anxiety Tried Buspar which worsened symptoms Was started on Wellbutrin x 2 weeks but is not helping symptoms [facility] for evaluation and possible Ketamine treatments  Vitamin D deficiency -     Vitamin D (25 hydroxy)  B12 deficiency -     Vitamin B12  BMI 29.0-29.9,adult  Overweight  -  discussion about weight loss, diet, and exercise -recommended diet heavy in fruits and veggies and low in animal meats, cheeses, and dairy products  Essential hypertension - continue medications, DASH diet, exercise and monitor at home. Call if greater than 130/80.  -     CBC with Differential/Platelet -     CMP  Hypothyroidism, unspecified type Hypothyroidism-check TSH level, continue medications the same, reminded to take on an empty stomach 30-3360mins before food.  -     TSH  PULMONARY EMBOLISM, HX OF Monitor, was provoked after breaking leg  Hyperlipidemia, unspecified hyperlipidemia type -continue medications, check lipids, decrease fatty foods, increase activity.   Recurrent major depressive disorder, in full remission (HCC) -  stress management techniques discussed, increase water, good sleep hygiene discussed, increase exercise, and increase veggies.  Not on any medications at this time.  [facility] for possible Ketamine treatments   Medication management -     Magnesium   Over 50 minutes of face  to face interview, exam, counseling, chart review and critical decision making was performed Future Appointments  Date Time Provider Department Center  10/15/2021 11:00 AM Revonda HumphreyMull, Jeromie Gainor W, NP [facility] None  03/24/2022  3:00 PM Revonda HumphreyMull, Davette Nugent W, NP [facility] None    Subjective:  Diana Wolfe is a 71 y.o. right handed female female who presents for complete physical.   She is having panic attacks, feels weight on chest, crying a lot.  This is happening everyday. She started on Wellbutrin 2 weeks ago, no changes in symptoms have been noticed.  Uncertain if she tried SSRI's in the past. Buspar was making symptoms worse. She is seeing a therapist and doing self esteem classes. Sleep has been a little better.  As soon as she wakes up she will start to feel overwhelmed and anxious. She is reluctant to try other medications because she is worried about gaining weight.    Her blood pressure has been controlled at home, today their BP is BP: 140/78 BP Readings from Last 3 Encounters:  03/24/21 140/78  02/08/21 (!) 158/70  08/28/20 136/74    She does not workout, she is active with her dog. She denies chest pain, shortness of breath, dizziness.  BMI is Body mass index is 28.17 kg/m., she is working on diet and exercise. Wt Readings from Last 3 Encounters:  03/24/21 154 lb (69.9 kg)  02/08/21 154 lb (69.9 kg)  10/15/20 154 lb 12.8 oz (70.2 kg)    She is not on cholesterol medication and denies myalgias. Her cholesterol is at goal. The cholesterol last visit was:   Lab Results  Component Value Date   CHOL 226 (  H) 10/15/2020   HDL 52 10/15/2020   LDLCALC 138 (H) 10/15/2020   LDLDIRECT 138.2 04/25/2008   TRIG 219 (H) 10/15/2020   CHOLHDL 4.3 10/15/2020   She is on the adderall, 1/2 tablet 1-2 x a day mainly when she drives.  She has chronic joint pain bilateral feet and left knee, on meloxicam 2 days a week. Takes prilosec as needed for GERD.     Last A1C in the office was:  Lab Results   Component Value Date   HGBA1C 5.6 10/15/2020   Last GFR: Lab Results  Component Value Date   GFRNONAA 81 03/24/2020   She is on thyroid medication. Her medication was not changed last visit.   Lab Results  Component Value Date   TSH 0.63 10/15/2020  .  Patient is on Vitamin D supplement.   Lab Results  Component Value Date   VD25OH 75 03/24/2020     She does have a history of shingles outbreaks which occur several times a year. I strongly recommend the Shingrix    Medication Review:  Current Outpatient Medications (Endocrine & Metabolic):    levothyroxine (SYNTHROID) 125 MCG tablet, TAKE 1 TABLET BY MOUTH DAILY ON AN EMPTYU STOMACH WITH ONLY WATER FOR 30 MINUTES AND NO ANTACID MEDS CALCIUM OR MAGNESIUM FOR 4 HOURS AND AVOID BIOTIN  Current Outpatient Medications (Cardiovascular):    EPINEPHrine 0.15 MG/0.15ML IJ injection, Inject 0.15 mLs (0.15 mg total) into the muscle as needed for anaphylaxis.  Current Outpatient Medications (Respiratory):    cetirizine (ZYRTEC) 10 MG tablet, 1 tablet  Current Outpatient Medications (Analgesics):    aspirin 81 MG chewable tablet, 1 tablet   meloxicam (MOBIC) 15 MG tablet, TAKE 1/2 TO 1 TABLET BY MOUTH DAILY WITH FOOD FOR PAIN AND INFLAMMATION  Current Outpatient Medications (Hematological):    Cyanocobalamin (B-12) 1000 MCG TABS, Take by mouth.  Current Outpatient Medications (Other):    ALPRAZolam (XANAX) 1 MG tablet, Take 1/2 - 1 tablet  at Bedtime  ONLY if needed for Sleep &  limit to 5 days /week to avoid Addiction & Dementia   amphetamine-dextroamphetamine (ADDERALL) 20 MG tablet, Take 1/2 to 1 tablet daily if needed for ADD   buPROPion (WELLBUTRIN XL) 150 MG 24 hr tablet, Take 1 tablet (150 mg total) by mouth every morning.   lidocaine (LIDODERM) 5 %, Place 1 patch onto the skin daily. Remove & Discard patch within 12 hours or as directed by MD   omeprazole (PRILOSEC) 40 MG capsule, TAKE ONE CAPSULE BY MOUTH DAILY TO PREVENT  HEARTBURN AND INDIGESTION   triamcinolone cream (KENALOG) 0.5 %, Apply 1 application topically 2 (two) times daily.   vitamin C (ASCORBIC ACID) 500 MG tablet, Take 1,200 mg by mouth daily.   Vitamin D, Ergocalciferol, (DRISDOL) 1.25 MG (50000 UNIT) CAPS capsule, Take 1 capsule 3 x /week on Mon Wed & Fri for Severe Vitamin D Deficiency   busPIRone (BUSPAR) 10 MG tablet, Take 1 tablet (10 mg total) by mouth 3 (three) times daily. 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   Dysphagia 04/21/2020   Generalized abdominal pain 04/21/2020   Hematochezia 04/21/2020   Left lower quadrant pain 04/21/2020   Personal history of colonic polyps 04/21/2020   Urticaria 04/21/2020   B12 deficiency 01/31/2019   Medication management 06/23/2014   Abnormal glucose 03/27/2014   Vaginal atrophy 03/27/2014   Hyperlipidemia    Hypertension    Anxiety    GERD (gastroesophageal reflux disease)    ADD (attention deficit disorder)    Depression    Vitamin D deficiency    Hypothyroidism 04/25/2008   Iron deficiency anemia 04/25/2008   Obstructive sleep apnea 11/16/2007   Allergic rhinitis 11/16/2007   Headache 11/16/2007   PULMONARY EMBOLISM, HX OF 11/16/2007    Screening Tests Immunization History  Administered Date(s) Administered   Fluad Quad(high Dose 65+) 10/28/2018   Influenza Whole 11/22/2007   Influenza, High Dose Seasonal PF 11/10/2019   Influenza-Unspecified 10/28/2016, 11/11/2020   Moderna SARS-COV2 Booster  Vaccination 04/06/2019, 05/04/2019   PFIZER(Purple Top)SARS-COV-2 Vaccination 01/30/2020   Pneumococcal Conjugate-13 05/02/2017   Pneumococcal Polysaccharide-23 10/11/2018   Td 04/25/2008, 05/16/2018   Varicella Zoster Immune Globulin 12/31/2014   Preventative care: Last colonoscopy:11/2019 due 2031 EGD 2017 Last mammogram: 10/2020 Last pap smear/pelvic exam: 2014 Dr. Seymour BarsLavoie DEXA: 07/2018 CT AB 09/2017 US soft tissue head 2018 MRI lumbar 2010  Prior vaccinations: TD or Tdap: 2020  Influenza: 2020 Pneumococcal: 2020 Prevnar13: 2019 Shingles/Zostavax: 2016 SARS-COV2: Completed Booster 12/2019  Names of Other Physician/Practitioners you currently use: 1. [facility] here for primary care 2. [address], eye doctor, last visit Jan 2021 3. Dr. Richardine ServicePaula Kaeper, dentist, last visit yearly 2021 Patient Care Team: Lucky CowboyMcKeown, William, MD as PCP - General (Internal Medicine)  SURGICAL HISTORY She  has a past surgical history that includes Foot surgery (Right); Cesarean section; Tonsillectomy (2002); Esophagogastroduodenoscopy (N/A, 09/02/2015); IM nailing tibia (Left, 01/09/2001); and Breast biopsy (Left, 07/09/2019). FAMILY HISTORY Her family history includes Breast cancer in her cousin and mother; Cancer (age of onset: 4870) in her mother; Heart disease in her father; Hypertension in her father. SOCIAL HISTORY She  reports that she has never smoked. She has never used smokeless tobacco. She reports current alcohol use. She reports that she does not use drugs.   Review of Systems  Constitutional: Negative.  Negative for chills and fever.  HENT:  Negative for congestion, ear pain, hearing loss, sinus pain, sore throat and tinnitus.   Eyes: Negative.  Negative for blurred vision and double vision.  Respiratory: Negative.  Negative for cough, hemoptysis, sputum production, shortness of breath and wheezing.   Cardiovascular: Negative.  Negative for  chest pain, palpitations and leg swelling.  Gastrointestinal: Negative.  Negative for abdominal pain, constipation, diarrhea, heartburn, nausea and vomiting.  Genitourinary: Negative.  Negative for dysuria and urgency.  Musculoskeletal:  Positive for joint pain (knees). Negative for back pain, falls, myalgias and neck pain.  Skin: Negative.  Negative for rash.  Neurological:  Negative for dizziness, tingling, tremors, weakness and headaches.  Endo/Heme/Allergies:  Does not bruise/bleed easily.  Psychiatric/Behavioral:  Positive for depression. Negative for suicidal ideas. The patient is nervous/anxious and has insomnia.        ADD   Objective:     Today's Vitals   03/24/21 1453  BP: 140/78  Pulse: 87  Temp: 97.6 F (36.4 C)  SpO2: 97%  Weight: 154 lb (69.9 kg)  Height: 5\' 2"  (1.575 m)  Body mass index is 28.17 kg/m.  General appearance: alert, no distress, WD/WN, female HEENT: normocephalic, sclerae anicteric, TMs pearly on right ear, nares patent, no discharge or erythema, pharynx normal.  Oral cavity: MMM, no lesions, scalloped tongue Neck: supple, no lymphadenopathy, no thyromegaly, no masses Heart: RRR, normal S1, S2 prominent S2, no murmurs Lungs: CTA bilaterally, no wheezes, rhonchi, or rales Abdomen: +bs, soft, non tender, non distended, no masses, no hepatomegaly, no splenomegaly Musculoskeletal: nontender, no swelling, Negative straight leg, + right SI pain.  Extremities: no edema, no cyanosis, no clubbing Pulses: 2+ symmetric, upper and lower extremities, normal cap refill Neurological: alert, oriented x 3, CN2-12 intact, strength normal upper extremities and lower extremities, sensation normal throughout, DTRs 2+ throughout, no cerebellar signs, gait normal Psychiatric: normal affect, behavior normal, pleasant  EKG: NSR, no ST changes   Revonda Humphrey, NP   03/24/2021

## 2021-03-24 ENCOUNTER — Ambulatory Visit (INDEPENDENT_AMBULATORY_CARE_PROVIDER_SITE_OTHER): Payer: Medicare Other | Admitting: Nurse Practitioner

## 2021-03-24 ENCOUNTER — Encounter: Payer: Self-pay | Admitting: Nurse Practitioner

## 2021-03-24 ENCOUNTER — Other Ambulatory Visit: Payer: Self-pay

## 2021-03-24 VITALS — BP 140/78 | HR 87 | Temp 97.6°F | Ht 62.0 in | Wt 154.0 lb

## 2021-03-24 DIAGNOSIS — E538 Deficiency of other specified B group vitamins: Secondary | ICD-10-CM

## 2021-03-24 DIAGNOSIS — G4733 Obstructive sleep apnea (adult) (pediatric): Secondary | ICD-10-CM | POA: Diagnosis not present

## 2021-03-24 DIAGNOSIS — Z86711 Personal history of pulmonary embolism: Secondary | ICD-10-CM

## 2021-03-24 DIAGNOSIS — F988 Other specified behavioral and emotional disorders with onset usually occurring in childhood and adolescence: Secondary | ICD-10-CM

## 2021-03-24 DIAGNOSIS — K219 Gastro-esophageal reflux disease without esophagitis: Secondary | ICD-10-CM

## 2021-03-24 DIAGNOSIS — Z79899 Other long term (current) drug therapy: Secondary | ICD-10-CM

## 2021-03-24 DIAGNOSIS — E785 Hyperlipidemia, unspecified: Secondary | ICD-10-CM

## 2021-03-24 DIAGNOSIS — R7309 Other abnormal glucose: Secondary | ICD-10-CM

## 2021-03-24 DIAGNOSIS — I10 Essential (primary) hypertension: Secondary | ICD-10-CM

## 2021-03-24 DIAGNOSIS — E559 Vitamin D deficiency, unspecified: Secondary | ICD-10-CM

## 2021-03-24 DIAGNOSIS — Z136 Encounter for screening for cardiovascular disorders: Secondary | ICD-10-CM

## 2021-03-24 DIAGNOSIS — Z0001 Encounter for general adult medical examination with abnormal findings: Secondary | ICD-10-CM

## 2021-03-24 DIAGNOSIS — E663 Overweight: Secondary | ICD-10-CM | POA: Diagnosis not present

## 2021-03-24 DIAGNOSIS — E039 Hypothyroidism, unspecified: Secondary | ICD-10-CM | POA: Diagnosis not present

## 2021-03-24 DIAGNOSIS — F419 Anxiety disorder, unspecified: Secondary | ICD-10-CM | POA: Diagnosis not present

## 2021-03-24 DIAGNOSIS — F3342 Major depressive disorder, recurrent, in full remission: Secondary | ICD-10-CM

## 2021-03-24 NOTE — Patient Instructions (Signed)
[facility] OFFICE [address], [address] [place] [address] ([place]) [phone]  Generalized Anxiety Disorder, Adult Generalized anxiety disorder (GAD) is a mental health condition. Unlike normal worries, anxiety related to GAD is not triggered by a specific event. These worries do not fade or get better with time. GAD interferes with relationships, work, and school. GAD symptoms can vary from mild to severe. People with severe GAD can have intense waves of anxiety with physical symptoms that are similar to panic attacks. What are the causes? The exact cause of GAD is not known, but the following are believed to have an impact: Differences in natural brain chemicals. Genes passed down from parents to children. Differences in the way threats are perceived. Development and stress during childhood. Personality. What increases the risk? The following factors may make you more likely to develop this condition: Being female. Having a family history of anxiety disorders. Being very shy. Experiencing very stressful life events, such as the death of a loved one. Having a very stressful family environment. What are the signs or symptoms? People with GAD often worry excessively about many things in their lives, such as their health and family. Symptoms may also include: Mental and emotional symptoms: Worrying excessively about natural disasters. Fear of being late. Difficulty concentrating. Fears that others are judging your performance. Physical symptoms: Fatigue. Headaches, muscle tension, muscle twitches, trembling, or feeling shaky. Feeling like your heart is pounding or beating very fast. Feeling out of breath or like you cannot take a deep breath. Having trouble falling asleep or staying asleep, or experiencing restlessness. Sweating. Nausea, diarrhea, or irritable bowel syndrome (IBS). Behavioral symptoms: Experiencing erratic moods or  irritability. Avoidance of new situations. Avoidance of people. Extreme difficulty making decisions. How is this diagnosed? This condition is diagnosed based on your symptoms and medical history. You will also have a physical exam. Your health care provider may perform tests to rule out other possible causes of your symptoms. To be diagnosed with GAD, a person must have anxiety that: Is out of his or her control. Affects several different aspects of his or her life, such as work and relationships. Causes distress that makes him or her unable to take part in normal activities. Includes at least three symptoms of GAD, such as restlessness, fatigue, trouble concentrating, irritability, muscle tension, or sleep problems. Before your health care provider can confirm a diagnosis of GAD, these symptoms must be present more days than they are not, and they must last for 6 months or longer. How is this treated? This condition may be treated with: Medicine. Antidepressant medicine is usually prescribed for long-term daily control. Anti-anxiety medicines may be added in severe cases, especially when panic attacks occur. Talk therapy (psychotherapy). Certain types of talk therapy can be helpful in treating GAD by providing support, education, and guidance. Options include: Cognitive behavioral therapy (CBT). People learn coping skills and self-calming techniques to ease their physical symptoms. They learn to identify unrealistic thoughts and behaviors and to replace them with more appropriate thoughts and behaviors. Acceptance and commitment therapy (ACT). This treatment teaches people how to be mindful as a way to cope with unwanted thoughts and feelings. Biofeedback. This process trains you to manage your body's response (physiological response) through breathing techniques and relaxation methods. You will work with a therapist while machines are used to monitor your physical symptoms. Stress management  techniques. These include yoga, meditation, and exercise. A mental health specialist can help determine which treatment  is best for you. Some people see improvement with one type of therapy. However, other people require a combination of therapies. Follow these instructions at home: Lifestyle Maintain a consistent routine and schedule. Anticipate stressful situations. Create a plan and allow extra time to work with your plan. Practice stress management or self-calming techniques that you have learned from your therapist or your health care provider. Exercise regularly and spend time outdoors. Eat a healthy diet that includes plenty of vegetables, fruits, whole grains, low-fat dairy products, and lean protein. Do not eat a lot of foods that are high in fat, added sugar, or salt (sodium). Drink plenty of water. Avoid alcohol. Alcohol can increase anxiety. Avoid caffeine and certain over-the-counter cold medicines. These may make you feel worse. Ask your pharmacist which medicines to avoid. General instructions Take over-the-counter and prescription medicines only as told by your health care provider. Understand that you are likely to have setbacks. Accept this and be kind to yourself as you persist to take better care of yourself. Anticipate stressful situations. Create a plan and allow extra time to work with your plan. Recognize and accept your accomplishments, even if you judge them as small. Spend time with people who care about you. Keep all follow-up visits. This is important. Where to find more information [employer] of [employer]: http://www.maynard.net/ Substance Abuse and Mental Health Services: [employer].pt Contact a health care provider if: Your symptoms do not get better. Your symptoms get worse. You have signs of depression, such as: A persistently sad or irritable mood. Loss of enjoyment in activities that used to bring you joy. Change in weight or eating. Changes in  sleeping habits. Get help right away if: You have thoughts about hurting yourself or others. If you ever feel like you may hurt yourself or others, or have thoughts about taking your own life, get help right away. Go to your nearest emergency department or: Call your local emergency services (911 in the [place] Call a suicide crisis helpline, such as the National Suicide Prevention Lifeline at (770) 627-8802 or 988 in the [place] This is open 24 hours a day in the [place] Text the Crisis Text Line at (769) 806-3458 (in the [place] Summary Generalized anxiety disorder (GAD) is a mental health condition that involves worry that is not triggered by a specific event. People with GAD often worry excessively about many things in their lives, such as their health and family. GAD may cause symptoms such as restlessness, trouble concentrating, sleep problems, frequent sweating, nausea, diarrhea, headaches, and trembling or muscle twitching. A mental health specialist can help determine which treatment is best for you. Some people see improvement with one type of therapy. However, other people require a combination of therapies. This information is not intended to replace advice given to you by your health care provider. Make sure you discuss any questions you have with your health care provider. Document Revised: 09/02/2020 Document Reviewed: 05/31/2020 Elsevier Patient Education  2022 [employer].

## 2021-03-25 LAB — TSH: TSH: 0.86 mIU/L (ref 0.40–4.50)

## 2021-03-25 LAB — CBC WITH DIFFERENTIAL/PLATELET
Absolute Monocytes: 728 cells/uL (ref 200–950)
Basophils Absolute: 33 cells/uL (ref 0–200)
Basophils Relative: 0.5 %
Eosinophils Absolute: 247 cells/uL (ref 15–500)
Eosinophils Relative: 3.8 %
HCT: 45.5 % — ABNORMAL HIGH (ref 35.0–45.0)
Hemoglobin: 15.1 g/dL (ref 11.7–15.5)
Lymphs Abs: 2249 cells/uL (ref 850–3900)
MCH: 28.5 pg (ref 27.0–33.0)
MCHC: 33.2 g/dL (ref 32.0–36.0)
MCV: 85.8 fL (ref 80.0–100.0)
MPV: 10.7 fL (ref 7.5–12.5)
Monocytes Relative: 11.2 %
Neutro Abs: 3244 cells/uL (ref 1500–7800)
Neutrophils Relative %: 49.9 %
Platelets: 311 10*3/uL (ref 140–400)
RBC: 5.3 10*6/uL — ABNORMAL HIGH (ref 3.80–5.10)
RDW: 12.7 % (ref 11.0–15.0)
Total Lymphocyte: 34.6 %
WBC: 6.5 10*3/uL (ref 3.8–10.8)

## 2021-03-25 LAB — MICROALBUMIN / CREATININE URINE RATIO
Creatinine, Urine: 80 mg/dL (ref 20–275)
Microalb Creat Ratio: 4 mcg/mg creat (ref <30)
Microalb, Ur: 0.3 mg/dL

## 2021-03-25 LAB — URINALYSIS, ROUTINE W REFLEX MICROSCOPIC
Bilirubin Urine: NEGATIVE
Glucose, UA: NEGATIVE
Hgb urine dipstick: NEGATIVE
Ketones, ur: NEGATIVE
Leukocytes,Ua: NEGATIVE
Nitrite: NEGATIVE
Protein, ur: NEGATIVE
Specific Gravity, Urine: 1.013 (ref 1.001–1.035)
pH: 6 (ref 5.0–8.0)

## 2021-03-25 LAB — COMPLETE METABOLIC PANEL WITH GFR
AG Ratio: 1.7 (calc) (ref 1.0–2.5)
ALT: 25 U/L (ref 6–29)
AST: 23 U/L (ref 10–35)
Albumin: 4.1 g/dL (ref 3.6–5.1)
Alkaline phosphatase (APISO): 90 U/L (ref 37–153)
BUN: 11 mg/dL (ref 7–25)
CO2: 28 mmol/L (ref 20–32)
Calcium: 9.7 mg/dL (ref 8.6–10.4)
Chloride: 105 mmol/L (ref 98–110)
Creat: 0.95 mg/dL (ref 0.50–1.05)
Globulin: 2.4 g/dL (calc) (ref 1.9–3.7)
Glucose, Bld: 73 mg/dL (ref 65–99)
Potassium: 4.5 mmol/L (ref 3.5–5.3)
Sodium: 140 mmol/L (ref 135–146)
Total Bilirubin: 0.4 mg/dL (ref 0.2–1.2)
Total Protein: 6.5 g/dL (ref 6.1–8.1)
eGFR: 65 mL/min/{1.73_m2} (ref >=60)

## 2021-03-25 LAB — LIPID PANEL
Cholesterol: 207 mg/dL — ABNORMAL HIGH (ref <200)
HDL: 46 mg/dL — ABNORMAL LOW (ref >=50)
LDL Cholesterol (Calc): 105 mg/dL (calc) — ABNORMAL HIGH
Non-HDL Cholesterol (Calc): 161 mg/dL (calc) — ABNORMAL HIGH (ref <130)
Total CHOL/HDL Ratio: 4.5 (calc) (ref <5.0)
Triglycerides: 392 mg/dL — ABNORMAL HIGH (ref <150)

## 2021-03-25 LAB — VITAMIN D 25 HYDROXY (VIT D DEFICIENCY, FRACTURES): Vit D, 25-Hydroxy: 73 ng/mL (ref 30–100)

## 2021-03-25 LAB — VITAMIN B12: Vitamin B-12: >2000 pg/mL — ABNORMAL HIGH (ref 200–1100)

## 2021-03-25 LAB — MAGNESIUM: Magnesium: 2.3 mg/dL (ref 1.5–2.5)

## 2021-03-25 LAB — HEMOGLOBIN A1C
Hgb A1c MFr Bld: 5.7 % of total Hgb — ABNORMAL HIGH (ref <5.7)
Mean Plasma Glucose: 117 mg/dL
eAG (mmol/L): 6.5 mmol/L

## 2021-03-26 ENCOUNTER — Encounter: Payer: Self-pay | Admitting: Nurse Practitioner

## 2021-03-29 NOTE — Telephone Encounter (Signed)
Can we make a referral to [facility]? She is interested in ketamine type therapy .

## 2021-04-01 DIAGNOSIS — U071 COVID-19: Secondary | ICD-10-CM | POA: Diagnosis not present

## 2021-04-05 ENCOUNTER — Encounter: Payer: Self-pay | Admitting: Nurse Practitioner

## 2021-04-06 ENCOUNTER — Other Ambulatory Visit: Payer: Self-pay | Admitting: Nurse Practitioner

## 2021-04-06 DIAGNOSIS — F419 Anxiety disorder, unspecified: Secondary | ICD-10-CM

## 2021-04-06 MED ORDER — BUPROPION HCL ER (XL) 300 MG PO TB24: 300.0000 mg | ORAL_TABLET | ORAL | 2 refills | Status: DC

## 2021-04-10 ENCOUNTER — Other Ambulatory Visit: Payer: Self-pay | Admitting: Internal Medicine

## 2021-04-10 MED ORDER — DEXAMETHASONE 4 MG PO TABS: ORAL_TABLET | ORAL | 0 refills | Status: DC

## 2021-04-11 ENCOUNTER — Encounter: Payer: Self-pay | Admitting: Nurse Practitioner

## 2021-04-12 ENCOUNTER — Other Ambulatory Visit: Payer: Self-pay | Admitting: Nurse Practitioner

## 2021-04-12 DIAGNOSIS — F988 Other specified behavioral and emotional disorders with onset usually occurring in childhood and adolescence: Secondary | ICD-10-CM

## 2021-04-12 MED ORDER — AMPHETAMINE-DEXTROAMPHETAMINE 20 MG PO TABS: ORAL_TABLET | ORAL | 0 refills | Status: DC

## 2021-04-13 ENCOUNTER — Encounter: Payer: Self-pay | Admitting: Nurse Practitioner

## 2021-04-14 ENCOUNTER — Other Ambulatory Visit: Payer: Self-pay | Admitting: Nurse Practitioner

## 2021-04-14 DIAGNOSIS — F419 Anxiety disorder, unspecified: Secondary | ICD-10-CM

## 2021-04-15 ENCOUNTER — Other Ambulatory Visit: Payer: Self-pay | Admitting: Nurse Practitioner

## 2021-04-15 DIAGNOSIS — F419 Anxiety disorder, unspecified: Secondary | ICD-10-CM

## 2021-04-15 MED ORDER — ALPRAZOLAM 1 MG PO TABS: ORAL_TABLET | ORAL | 0 refills | Status: DC

## 2021-04-19 ENCOUNTER — Encounter: Payer: Self-pay | Admitting: Nurse Practitioner

## 2021-04-20 DIAGNOSIS — Z87898 Personal history of other specified conditions: Secondary | ICD-10-CM | POA: Diagnosis not present

## 2021-04-20 DIAGNOSIS — E039 Hypothyroidism, unspecified: Secondary | ICD-10-CM | POA: Diagnosis not present

## 2021-04-20 DIAGNOSIS — K219 Gastro-esophageal reflux disease without esophagitis: Secondary | ICD-10-CM | POA: Diagnosis not present

## 2021-04-20 DIAGNOSIS — M545 Low back pain, unspecified: Secondary | ICD-10-CM | POA: Diagnosis not present

## 2021-04-20 DIAGNOSIS — J069 Acute upper respiratory infection, unspecified: Secondary | ICD-10-CM | POA: Diagnosis not present

## 2021-04-20 DIAGNOSIS — J029 Acute pharyngitis, unspecified: Secondary | ICD-10-CM | POA: Diagnosis not present

## 2021-04-23 ENCOUNTER — Other Ambulatory Visit: Payer: Self-pay | Admitting: Nurse Practitioner

## 2021-04-23 DIAGNOSIS — U071 COVID-19: Secondary | ICD-10-CM

## 2021-04-23 MED ORDER — DEXAMETHASONE 2 MG PO TABS: ORAL_TABLET | ORAL | 0 refills | Status: DC

## 2021-05-12 DIAGNOSIS — F341 Dysthymic disorder: Secondary | ICD-10-CM | POA: Diagnosis not present

## 2021-05-12 DIAGNOSIS — F331 Major depressive disorder, recurrent, moderate: Secondary | ICD-10-CM | POA: Diagnosis not present

## 2021-05-12 DIAGNOSIS — F909 Attention-deficit hyperactivity disorder, unspecified type: Secondary | ICD-10-CM | POA: Diagnosis not present

## 2021-05-13 ENCOUNTER — Encounter: Payer: Self-pay | Admitting: Nurse Practitioner

## 2021-05-13 ENCOUNTER — Other Ambulatory Visit: Payer: Self-pay | Admitting: Nurse Practitioner

## 2021-05-13 DIAGNOSIS — F419 Anxiety disorder, unspecified: Secondary | ICD-10-CM

## 2021-05-13 DIAGNOSIS — F988 Other specified behavioral and emotional disorders with onset usually occurring in childhood and adolescence: Secondary | ICD-10-CM

## 2021-05-13 MED ORDER — ALPRAZOLAM 1 MG PO TABS: ORAL_TABLET | ORAL | 0 refills | Status: DC

## 2021-05-14 ENCOUNTER — Other Ambulatory Visit: Payer: Self-pay | Admitting: Nurse Practitioner

## 2021-05-14 DIAGNOSIS — F988 Other specified behavioral and emotional disorders with onset usually occurring in childhood and adolescence: Secondary | ICD-10-CM

## 2021-05-14 MED ORDER — AMPHETAMINE-DEXTROAMPHETAMINE 30 MG PO TABS: 30.0000 mg | ORAL_TABLET | Freq: Every day | ORAL | 0 refills | Status: DC

## 2021-05-19 DIAGNOSIS — H35363 Drusen (degenerative) of macula, bilateral: Secondary | ICD-10-CM | POA: Diagnosis not present

## 2021-06-19 ENCOUNTER — Encounter: Payer: Self-pay | Admitting: Nurse Practitioner

## 2021-06-22 ENCOUNTER — Other Ambulatory Visit: Payer: Self-pay | Admitting: Nurse Practitioner

## 2021-06-22 DIAGNOSIS — F419 Anxiety disorder, unspecified: Secondary | ICD-10-CM

## 2021-06-22 DIAGNOSIS — F988 Other specified behavioral and emotional disorders with onset usually occurring in childhood and adolescence: Secondary | ICD-10-CM

## 2021-06-22 MED ORDER — AMPHETAMINE-DEXTROAMPHETAMINE 30 MG PO TABS: 30.0000 mg | ORAL_TABLET | Freq: Every day | ORAL | 0 refills | Status: DC

## 2021-06-22 MED ORDER — ALPRAZOLAM 1 MG PO TABS: ORAL_TABLET | ORAL | 0 refills | Status: DC

## 2021-06-22 NOTE — Progress Notes (Signed)
New Rx for Adderall sent to [facility] in [place] ?

## 2021-07-21 ENCOUNTER — Other Ambulatory Visit: Payer: Self-pay | Admitting: Nurse Practitioner

## 2021-07-21 ENCOUNTER — Encounter: Payer: Self-pay | Admitting: Nurse Practitioner

## 2021-07-21 DIAGNOSIS — F419 Anxiety disorder, unspecified: Secondary | ICD-10-CM

## 2021-07-21 DIAGNOSIS — F988 Other specified behavioral and emotional disorders with onset usually occurring in childhood and adolescence: Secondary | ICD-10-CM

## 2021-07-21 DIAGNOSIS — R21 Rash and other nonspecific skin eruption: Secondary | ICD-10-CM

## 2021-07-21 DIAGNOSIS — K219 Gastro-esophageal reflux disease without esophagitis: Secondary | ICD-10-CM

## 2021-07-21 MED ORDER — ALPRAZOLAM 1 MG PO TABS: ORAL_TABLET | ORAL | 0 refills | Status: DC

## 2021-07-21 MED ORDER — OMEPRAZOLE 40 MG PO CPDR: DELAYED_RELEASE_CAPSULE | ORAL | 1 refills | Status: DC

## 2021-07-21 MED ORDER — AMPHETAMINE-DEXTROAMPHETAMINE 30 MG PO TABS: 30.0000 mg | ORAL_TABLET | Freq: Every day | ORAL | 0 refills | Status: DC

## 2021-08-17 ENCOUNTER — Encounter: Payer: Self-pay | Admitting: Nurse Practitioner

## 2021-08-18 ENCOUNTER — Other Ambulatory Visit: Payer: Self-pay | Admitting: Nurse Practitioner

## 2021-08-18 DIAGNOSIS — F988 Other specified behavioral and emotional disorders with onset usually occurring in childhood and adolescence: Secondary | ICD-10-CM

## 2021-08-18 DIAGNOSIS — F419 Anxiety disorder, unspecified: Secondary | ICD-10-CM

## 2021-08-18 MED ORDER — ALPRAZOLAM 1 MG PO TABS: ORAL_TABLET | ORAL | 0 refills | Status: DC

## 2021-08-18 MED ORDER — AMPHETAMINE-DEXTROAMPHETAMINE 30 MG PO TABS: 30.0000 mg | ORAL_TABLET | Freq: Every day | ORAL | 0 refills | Status: DC

## 2021-08-18 NOTE — Progress Notes (Signed)
PDMP is reviewed and appropriate  

## 2021-09-01 ENCOUNTER — Other Ambulatory Visit: Payer: Self-pay

## 2021-09-01 MED ORDER — LEVOTHYROXINE SODIUM 125 MCG PO TABS: ORAL_TABLET | ORAL | 1 refills | Status: DC

## 2021-09-17 ENCOUNTER — Other Ambulatory Visit: Payer: Self-pay | Admitting: Nurse Practitioner

## 2021-09-17 ENCOUNTER — Telehealth: Payer: Self-pay | Admitting: Nurse Practitioner

## 2021-09-17 DIAGNOSIS — F988 Other specified behavioral and emotional disorders with onset usually occurring in childhood and adolescence: Secondary | ICD-10-CM

## 2021-09-17 DIAGNOSIS — F419 Anxiety disorder, unspecified: Secondary | ICD-10-CM

## 2021-09-17 MED ORDER — AMPHETAMINE-DEXTROAMPHETAMINE 30 MG PO TABS: 30.0000 mg | ORAL_TABLET | Freq: Every day | ORAL | 0 refills | Status: DC

## 2021-09-17 MED ORDER — ALPRAZOLAM 1 MG PO TABS: ORAL_TABLET | ORAL | 0 refills | Status: DC

## 2021-09-17 NOTE — Telephone Encounter (Signed)
Pt is wanting a refill on Alprazolam and Xanax to go to [facility] [employer]

## 2021-09-23 ENCOUNTER — Ambulatory Visit (INDEPENDENT_AMBULATORY_CARE_PROVIDER_SITE_OTHER): Payer: Medicare Other | Admitting: Nurse Practitioner

## 2021-09-23 ENCOUNTER — Encounter: Payer: Self-pay | Admitting: Nurse Practitioner

## 2021-09-23 VITALS — BP 142/74 | HR 86 | Temp 97.9°F | Ht 62.0 in | Wt 155.8 lb

## 2021-09-23 DIAGNOSIS — M25572 Pain in left ankle and joints of left foot: Secondary | ICD-10-CM | POA: Diagnosis not present

## 2021-09-23 DIAGNOSIS — R350 Frequency of micturition: Secondary | ICD-10-CM | POA: Diagnosis not present

## 2021-09-23 MED ORDER — AMOXICILLIN 500 MG PO TABS: 500.0000 mg | ORAL_TABLET | Freq: Three times a day (TID) | ORAL | 0 refills | Status: DC

## 2021-09-23 NOTE — Progress Notes (Signed)
Assessment and Plan:  Diana Wolfe was seen today for urinary tract infection.  Diagnoses and all orders for this visit:  Urinary frequency Push fluids Go to the ER if develop nausea and vomiting, fever, severe abdominal pain -     Urinalysis, Routine w reflex microscopic -     Urine Culture -     amoxicillin (AMOXIL) 500 MG tablet; Take 1 tablet (500 mg total) by mouth 3 (three) times daily. 10 days  Acute left ankle pain Take Meloxicam  once a day for 5 days If no relief will get Xray      Further disposition pending results of labs. Discussed med's effects and SE's.   Over 30 minutes of exam, counseling, chart review, and critical decision making was performed.   Future Appointments  Date Time Provider Department Center  10/15/2021 11:00 AM Diana Dick, NP [facility] None  03/24/2022  3:00 PM Diana Dick, NP [facility] None    ------------------------------------------------------------------------------------------------------------------   HPI BP (!) 142/74   Pulse 86   Temp 97.9 F (36.6 C)   Ht 5\' 2"  (1.575 m)   Wt 155 lb 12.8 oz (70.7 kg)   SpO2 97%   BMI 28.50 kg/m   71 y.o.female presents for urinary frequency x 3 days.  Denies dysuria, hematuria, back pain, fever and chills  BP is well controlled without medication BP Readings from Last 3 Encounters:  09/23/21 (!) 142/74  03/24/21 140/78  02/08/21 (!) 158/70   She has noted pain in left ankle, believes she has a swollen area in ankle that is tender. Denies any injury to the area.  Past Medical History:  Diagnosis Date   ADD (attention deficit disorder)    Anxiety    Depression    GERD (gastroesophageal reflux disease)    Hyperlipidemia    Hypertension    Hypothyroid    PONV (postoperative nausea and vomiting)    Vitamin D deficiency      Allergies  Allergen Reactions   Levofloxacin     Neck sweling/difficult breathing   Cefprozil    Codeine     REACTION: vomiting    Hydrocodone-Acetaminophen    Morphine    Nitrofuran Derivatives Itching   Pravastatin Swelling   Lexapro [Escitalopram Oxalate] Other (See Comments)    Tapping/abnormal movements    Current Outpatient Medications on File Prior to Visit  Medication Sig   ALPRAZolam (XANAX) 1 MG tablet Take 1/2 - 1 tablet  at Bedtime  ONLY if needed for Sleep &  limit to 5 days /week to avoid Addiction & Dementia   amphetamine-dextroamphetamine (ADDERALL) 30 MG tablet Take 1 tablet by mouth daily.   aspirin 81 MG chewable tablet 1 tablet   buPROPion (WELLBUTRIN XL) 300 MG 24 hr tablet Take 1 tablet (300 mg total) by mouth every morning.   busPIRone (BUSPAR) 10 MG tablet Take 1 tablet (10 mg total) by mouth 3 (three) times daily. (Patient not taking: Reported on 03/24/2021)   cetirizine (ZYRTEC) 10 MG tablet 1 tablet   Cyanocobalamin (B-12) 1000 MCG TABS Take by mouth.   dexamethasone (DECADRON) 2 MG tablet Take 1 tab 3 x /day for 2 days,      then 2 x /day for 2  Days,     then 1 tab daily   EPINEPHrine 0.15 MG/0.15ML IJ injection Inject 0.15 mLs (0.15 mg total) into the muscle as needed for anaphylaxis.   levothyroxine (SYNTHROID) 125 MCG tablet TAKE 1 TABLET BY MOUTH DAILY ON AN  EMPTYU STOMACH WITH ONLY WATER FOR 30 MINUTES AND NO ANTACID MEDS CALCIUM OR MAGNESIUM FOR 4 HOURS AND AVOID BIOTIN   lidocaine (LIDODERM) 5 % Place 1 patch onto the skin daily. Remove & Discard patch within 12 hours or as directed by MD   meloxicam (MOBIC) 15 MG tablet TAKE 1/2 TO 1 TABLET BY MOUTH DAILY WITH FOOD FOR PAIN AND INFLAMMATION   omeprazole (PRILOSEC) 40 MG capsule TAKE ONE CAPSULE BY MOUTH DAILY TO PREVENT HEARTBURN AND INDIGESTION   triamcinolone cream (KENALOG) 0.5 % Apply 1 application topically 2 (two) times daily.   vitamin C (ASCORBIC ACID) 500 MG tablet Take 1,200 mg by mouth daily.   Vitamin D, Ergocalciferol, (DRISDOL) 1.25 MG (50000 UNIT) CAPS capsule Take 1 capsule 3 x /week on Mon Wed & Fri for Severe Vitamin  D Deficiency   No current facility-administered medications on file prior to visit.    ROS: all negative except above.   Physical Exam:  BP (!) 142/74   Pulse 86   Temp 97.9 F (36.6 C)   Ht 5\' 2"  (1.575 m)   Wt 155 lb 12.8 oz (70.7 kg)   SpO2 97%   BMI 28.50 kg/m   General Appearance: Well nourished, in no apparent distress. Eyes: PERRLA, EOMs, conjunctiva no swelling or erythema Sinuses: No Frontal/maxillary tenderness ENT/Mouth: Ext aud canals clear, TMs without erythema, bulging. No erythema, swelling, or exudate on post pharynx.  Tonsils not swollen or erythematous. Hearing normal.  Neck: Supple, thyroid normal.  Respiratory: Respiratory effort normal, BS equal bilaterally without rales, rhonchi, wheezing or stridor.  Cardio: RRR with no MRGs. Brisk peripheral pulses without edema.  Abdomen: Soft, + BS.  Non tender, no guarding, rebound, hernias, masses. Lymphatics: Non tender without lymphadenopathy.  Musculoskeletal: Full ROM, 5/5 strength, normal gait. Mild tenderness lateral side of left ankle, no definitive mass felt. Negative CVA tenderness Skin: Warm, dry without rashes, lesions, ecchymosis.  Neuro: Cranial nerves intact. Normal muscle tone, no cerebellar symptoms. Sensation intact.  Psych: Awake and oriented X 3, normal affect, Insight and Judgment appropriate.     , NP 4:03 PM [facility]

## 2021-09-24 ENCOUNTER — Encounter: Payer: Self-pay | Admitting: Nurse Practitioner

## 2021-09-24 LAB — URINALYSIS, ROUTINE W REFLEX MICROSCOPIC
Bacteria, UA: NONE SEEN /HPF
Bilirubin Urine: NEGATIVE
Glucose, UA: NEGATIVE
Hgb urine dipstick: NEGATIVE
Hyaline Cast: NONE SEEN /LPF
Ketones, ur: NEGATIVE
Leukocytes,Ua: NEGATIVE
Nitrite: NEGATIVE
RBC / HPF: NONE SEEN /HPF (ref 0–2)
Specific Gravity, Urine: 1.005 (ref 1.001–1.035)
Squamous Epithelial / HPF: NONE SEEN /HPF (ref <=5)
WBC, UA: NONE SEEN /HPF (ref 0–5)
pH: 6.5 (ref 5.0–8.0)

## 2021-09-24 LAB — URINE CULTURE
MICRO NUMBER:: 13732966
Result:: NO GROWTH
SPECIMEN QUALITY:: ADEQUATE

## 2021-09-24 LAB — MICROSCOPIC MESSAGE

## 2021-09-27 ENCOUNTER — Encounter: Payer: Self-pay | Admitting: Nurse Practitioner

## 2021-10-13 NOTE — Progress Notes (Signed)
MEDICARE WELLNESS AND FOLLOW UP  Assessment:   Diana Wolfe was seen today for follow-up and medicare wellness.  Diagnoses and all orders for this visit:  Encounter for Medicare annual wellness exam  Due Annually  Health Maintenance Reviewed  Healthy lifestyle reviewed and goals set   Essential hypertension  - continue medications, DASH diet, exercise and monitor at home. Call if greater than 130/80.    - Pt started on HCTZ 12.5mg  which she is to take if BP is greater than 130/80.  Pt to kepp BP log.  Abnormal glucose Continue diet and exercise -     Hemoglobin A1c  Recurrent major depressive disorder, in full remission (HCC)  Pt believes symptoms are more related to Anxiety.    Continue researching TMS therapy.   Attention deficit disorder, unspecified hyperactivity presence       Continue Concerta as needed  Anxiety -     busPIRone (BUSPAR) 10 MG tablet; Take 1 tablet (10 mg total) by mouth 3 (three) times daily. - Continue therapy and medication and monitor symptoms  Gastroesophageal reflux disease without esophagitis  Continue Prilosec 40 mg TID and dietary modifications  Hypothyroidism, unspecified type Please take your thyroid medication greater than 30 min before breakfast, separated by at least 4 hours  from antacids, calcium, iron, and multivitamins.  -     TSH  Hyperlipidemia, unspecified hyperlipidemia type Continue diet and exercise -     COMPLETE METABOLIC PANEL WITH GFR -     Lipid panel  Vitamin D deficiency       - Ergocalciferol 50000 units three times a week  Defer Vit D level today  Iron deficiency anemia, unspecified iron deficiency anemia type -     CBC with Differential/Platelet  BLE pain Start prednisone 5 mg PRN during flare. Possible referral to orthopedics if symptoms fail to improve Stay well hydrated. Continue to monitor  Orders Placed This Encounter  Procedures   CBC with Differential/Platelet   COMPLETE METABOLIC PANEL WITH GFR    Lipid panel   Hemoglobin A1c   TSH   Meds ordered this encounter  Medications   predniSONE (DELTASONE) 5 MG tablet    Sig: Take 1 tablet (5 mg total) by mouth daily.    Dispense:  10 tablet    Refill:  0    Order Specific Question:   Supervising Provider    Answer:   Lucky Cowboy [[id number]]    Over 40 minutes of exam, counseling, chart review and critical decision making was performed Future Appointments  Date Time Provider Department Center  03/24/2022  3:00 PM Raynelle Dick, NP [facility] None  10/18/2022 11:00 AM Raynelle Dick, NP [facility] None    Subjective:  Diana Wolfe is a 71 y.o.  female who presents for medicare wellness and 3 month follow up.   Overall she reports doing well.    She will be driving to [place] soon.  She has anxiety about the drive.  She reports pain in the BLE when driving d/t past surgeries, needing to stop multiple times.  Has Meloxicam PRN. She may be interested in further evaluation and treatment with steroid injections if symptoms do not improve.   She is researching TMS therapy with [facility] for treatment of anxiety.  She currently follows with a therapist.  Abdominal pain bloating and cramping intermittently and is effecting appetite which is now less.  Has occasional nausea. Denies diarrhea and constipation. Has abdominal CT scheduled 10/27/20 Abdominal U/S was performed  but showed no abnormalities but were unable to due transvaginal due to vaginal atrophy.  Hiatal hernia x 25 years controlled with Prilosec 3 days a week.  Lab Results  Component Value Date   ALT 25 03/24/2021   AST 23 03/24/2021   ALKPHOS 83 05/23/2016   BILITOT 0.4 03/24/2021    Anxiety /Depression/Insomnia currently on Buspar 5 mg TID-no longer taking.  Feels as though multiple medications have not worked for her.  Xanax 1 mg PRN QHS.  ADHD symptoms are currently controlled on Adderall 20 mg 1/2 - 1 tab daily as needed.  Symptoms controlled.    Her blood  pressure has been controlled at home 130's/70"s, today their BP is BP: 130/80 BP Readings from Last 3 Encounters:  10/15/21 130/80  09/23/21 (!) 142/74  03/24/21 140/78    She does not workout, she is active with her dog. She denies chest pain, shortness of breath, dizziness.  BMI is Body mass index is 27.44 kg/m., she is working on diet and exercise. Wt Readings from Last 3 Encounters:  10/15/21 150 lb (68 kg)  09/23/21 155 lb 12.8 oz (70.7 kg)  03/24/21 154 lb (69.9 kg)   Currently on levothyroxine 125 mcg daily 5 days a week and 1/2 pill 2 days a week for hypothyroidism. Last tsh was Lab Results  Component Value Date   TSH 0.86 03/24/2021    Currently on no medication for vaginal atrophy, will continue to monitor symptoms but has had no further urinary issues.   She is not on cholesterol medication. Her cholesterol is not at goal. Pt refuses cholesterol medication. The cholesterol last visit was:   Lab Results  Component Value Date   CHOL 207 (H) 03/24/2021   HDL 46 (L) 03/24/2021   LDLCALC 105 (H) 03/24/2021   LDLDIRECT 138.2 04/25/2008   TRIG 392 (H) 03/24/2021   CHOLHDL 4.5 03/24/2021      Last A1C in the office was:  Lab Results  Component Value Date   HGBA1C 5.7 (H) 03/24/2021   Last GFR: Lab Results  Component Value Date   GFRNONAA 81 03/24/2020   .  Patient is on Vitamin D supplement.   Lab Results  Component Value Date   VD25OH 73 03/24/2021      Medication Review:  Current Outpatient Medications (Endocrine & Metabolic):    levothyroxine (SYNTHROID) 125 MCG tablet, TAKE 1 TABLET BY MOUTH DAILY ON AN EMPTYU STOMACH WITH ONLY WATER FOR 30 MINUTES AND NO ANTACID MEDS CALCIUM OR MAGNESIUM FOR 4 HOURS AND AVOID BIOTIN   predniSONE (DELTASONE) 5 MG tablet, Take 1 tablet (5 mg total) by mouth daily.   Current Outpatient Medications (Respiratory):    cetirizine (ZYRTEC) 10 MG tablet, 1 tablet  Current Outpatient Medications (Analgesics):    aspirin 81  MG chewable tablet, 3 days a week   meloxicam (MOBIC) 15 MG tablet, TAKE 1/2 TO 1 TABLET BY MOUTH DAILY WITH FOOD FOR PAIN AND INFLAMMATION  Current Outpatient Medications (Hematological):    Cyanocobalamin (B-12) 1000 MCG TABS, Take by mouth.  Current Outpatient Medications (Other):    ALPRAZolam (XANAX) 1 MG tablet, Take 1/2 - 1 tablet  at Bedtime  ONLY if needed for Sleep &  limit to 5 days /week to avoid Addiction & Dementia   amoxicillin (AMOXIL) 500 MG tablet, Take 1 tablet (500 mg total) by mouth 3 (three) times daily. 10 days   amphetamine-dextroamphetamine (ADDERALL) 30 MG tablet, Take 1 tablet by mouth daily.   lidocaine (  LIDODERM) 5 %, Place 1 patch onto the skin daily. Remove & Discard patch within 12 hours or as directed by MD (Patient taking differently: Place 1 patch onto the skin daily. Remove & Discard patch within 12 hours or as directed by MD PRN)   omeprazole (PRILOSEC) 40 MG capsule, TAKE ONE CAPSULE BY MOUTH DAILY TO PREVENT HEARTBURN AND INDIGESTION   triamcinolone cream (KENALOG) 0.5 %, Apply 1 application topically 2 (two) times daily.   vitamin C (ASCORBIC ACID) 500 MG tablet, Take 1,200 mg by mouth daily.   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   Dysphagia 04/21/2020   Generalized abdominal pain 04/21/2020   Hematochezia 04/21/2020   Left lower quadrant pain 04/21/2020   Personal  history of colonic polyps 04/21/2020   Urticaria 04/21/2020   B12 deficiency 01/31/2019   Medication management 06/23/2014   Abnormal glucose 03/27/2014   Vaginal atrophy 03/27/2014   Hyperlipidemia    Hypertension    Anxiety    GERD (gastroesophageal reflux disease)    ADD (attention deficit disorder)    Depression    Vitamin D deficiency    Hypothyroidism 04/25/2008   Iron deficiency anemia 04/25/2008   Obstructive sleep apnea 11/16/2007   Allergic rhinitis 11/16/2007   Headache 11/16/2007   PULMONARY EMBOLISM, HX OF 11/16/2007    Screening Tests Immunization History  Administered Date(s) Administered   Fluad Quad(high Dose 65+) 10/28/2018   Influenza Whole 11/22/2007   Influenza, High Dose Seasonal PF 11/10/2019   Influenza-Unspecified 10/28/2016, 11/11/2020   Moderna SARS-COV2 Booster Vaccination 04/06/2019, 05/04/2019   PFIZER(Purple Top)SARS-COV-2 Vaccination 01/30/2020   Pneumococcal Conjugate-13 05/02/2017   Pneumococcal Polysaccharide-23 10/11/2018   Td 04/25/2008, 05/16/2018   Varicella Zoster Immune Globulin 12/31/2014   Preventative care: Last colonoscopy: Dr. 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  Last pap smear/pelvic exam: unknown DEXA: 07/2018 - patient defers to wait.   CT AB 09/2017 US soft tissue head 2018 MRI lumbar 2010  Prior vaccinations: TD or Tdap: 05/16/2018  Influenza: Due 11/2021 Pneumococcal: 2020 Prevnar13: 2019 Shingles/Zostavax: 2016 Shingrix: Strongly encouraged COVID : 2 + 1 booster  Names of Other Physician/Practitioners you currently use: 1. [facility] here for primary care 2. [facility], eye doctor, last visit 2023 just got new glasses. 3. Dr. Richardine Service, dentist, last visit yearly  2022 Patient Care Team: Lucky Cowboy, MD as PCP - General (Internal Medicine)  SURGICAL HISTORY She  has a past surgical history that includes Foot surgery (Right); Cesarean section; Tonsillectomy (2002); Esophagogastroduodenoscopy (N/A, 09/02/2015); IM nailing tibia (Left, 01/09/2001); and Breast biopsy (Left, 07/09/2019). FAMILY HISTORY Her family history includes Breast cancer in her cousin and mother; Cancer (age of onset: 44) in her mother;  Heart disease in her father; Hypertension in her father. SOCIAL HISTORY She  reports that she has never smoked. She has never used smokeless tobacco. She reports current alcohol use. She reports that she does not use drugs.   Review of Systems  Constitutional: Negative.  Negative for chills, fever and weight loss.  HENT:  Negative for congestion, ear pain, hearing loss and sore throat.   Eyes: Negative.  Negative for blurred vision and double vision.  Respiratory: Negative.  Negative for cough and shortness of breath.   Cardiovascular: Negative.  Negative for chest pain, palpitations, orthopnea and leg swelling.  Gastrointestinal:  Positive for heartburn. Negative for abdominal pain, constipation, diarrhea, nausea and vomiting.       RLQ abdominal pain and bloating  Genitourinary: Negative.   Musculoskeletal:  Negative for back pain, falls, joint pain and myalgias.  Skin: Negative.  Negative for rash.  Neurological:  Negative for dizziness, tingling, tremors, loss of consciousness and headaches.  Psychiatric/Behavioral:  Negative for depression, memory loss and suicidal ideas. The patient is nervous/anxious.     Objective:     Today's Vitals   10/15/21 1108  BP: 130/80  Pulse: 96  Resp: 16  Temp: 97.9 F (36.6 C)  SpO2: 97%  Weight: 150 lb (68 kg)  Height: 5\' 2"  (1.575 m)   Body mass index is 27.44 kg/m.  General appearance: alert, no distress, WD/WN, female HEENT: normocephalic, sclerae anicteric, TMs pearly on right ear,  nares patent, no discharge or erythema, pharynx normal.  Oral cavity: MMM, no lesions, scalloped tongue Neck: supple, no lymphadenopathy, no thyromegaly, no masses Heart: RRR, normal S1, S2 prominent S2, no murmurs Lungs: CTA bilaterally, no wheezes, rhonchi, or rales Abdomen: +bs, soft, nMILD TENDERNESS rlqnon distended, no masses, no hepatomegaly, no splenomegaly Musculoskeletal: nontender, no swelling, Negative straight leg, + right SI pain.  Extremities: no edema, no cyanosis, no clubbing Pulses: 2+ symmetric, upper and lower extremities, normal cap refill Neurological: alert, oriented x 3, CN2-12 intact, strength normal upper extremities and lower extremities, sensation normal throughout, DTRs 2+ throughout, no cerebellar signs, gait normal Psychiatric: normal affect, behavior normal, pleasant   Medicare Attestation I have personally reviewed: The patient's medical and social history Their use of alcohol, tobacco or illicit drugs Their current medications and supplements The patient's functional ability including ADLs,fall risks, home safety risks, cognitive, and hearing and visual impairment Diet and physical activities Evidence for depression or mood disorders  The patient's weight, height, BMI, and visual acuity have been recorded in the chart.  I have made referrals, counseling, and provided education to the patient based on review of the above and I have provided the patient with a written personalized care plan for preventive services.    ANP-C  [facility]  10/15/2021

## 2021-10-14 ENCOUNTER — Other Ambulatory Visit: Payer: Self-pay

## 2021-10-14 ENCOUNTER — Telehealth: Payer: Self-pay | Admitting: *Deleted

## 2021-10-14 DIAGNOSIS — I10 Essential (primary) hypertension: Secondary | ICD-10-CM

## 2021-10-14 NOTE — Chronic Care Management (AMB) (Signed)
  Care Coordination  Outreach Note  10/14/2021 Name: Diana Wolfe MRN: [id number] DOB: [birth year]   Care Coordination Outreach Attempts  Contact was made with the patient today to offer care coordination services as a benefit of their health plan. The patient requested a return call on a later date.   Follow Up Plan:  Additional outreach attempts will be made to offer the patient care coordination information and services.  Patient would like information regarding stress and anxiety after her return.   Encounter Outcome:  Pt. Request to Call Back week of 11/08/21. Patient is currently going out of town and would like a follow up call to schedule with case Production designer, theatre/television/film.   [facility]  Care Coordination Care Guide  Direct Dial: [phone]

## 2021-10-15 ENCOUNTER — Encounter: Payer: Self-pay | Admitting: Nurse Practitioner

## 2021-10-15 ENCOUNTER — Ambulatory Visit (INDEPENDENT_AMBULATORY_CARE_PROVIDER_SITE_OTHER): Payer: Medicare Other | Admitting: Nurse Practitioner

## 2021-10-15 VITALS — BP 130/80 | HR 96 | Temp 97.9°F | Resp 16 | Ht 62.0 in | Wt 150.0 lb

## 2021-10-15 DIAGNOSIS — I10 Essential (primary) hypertension: Secondary | ICD-10-CM | POA: Diagnosis not present

## 2021-10-15 DIAGNOSIS — Z79899 Other long term (current) drug therapy: Secondary | ICD-10-CM

## 2021-10-15 DIAGNOSIS — N952 Postmenopausal atrophic vaginitis: Secondary | ICD-10-CM

## 2021-10-15 DIAGNOSIS — M79605 Pain in left leg: Secondary | ICD-10-CM

## 2021-10-15 DIAGNOSIS — R6889 Other general symptoms and signs: Secondary | ICD-10-CM

## 2021-10-15 DIAGNOSIS — E785 Hyperlipidemia, unspecified: Secondary | ICD-10-CM | POA: Diagnosis not present

## 2021-10-15 DIAGNOSIS — E559 Vitamin D deficiency, unspecified: Secondary | ICD-10-CM

## 2021-10-15 DIAGNOSIS — E039 Hypothyroidism, unspecified: Secondary | ICD-10-CM | POA: Diagnosis not present

## 2021-10-15 DIAGNOSIS — K219 Gastro-esophageal reflux disease without esophagitis: Secondary | ICD-10-CM | POA: Diagnosis not present

## 2021-10-15 DIAGNOSIS — D509 Iron deficiency anemia, unspecified: Secondary | ICD-10-CM | POA: Diagnosis not present

## 2021-10-15 DIAGNOSIS — F419 Anxiety disorder, unspecified: Secondary | ICD-10-CM | POA: Diagnosis not present

## 2021-10-15 DIAGNOSIS — Z0001 Encounter for general adult medical examination with abnormal findings: Secondary | ICD-10-CM

## 2021-10-15 DIAGNOSIS — F988 Other specified behavioral and emotional disorders with onset usually occurring in childhood and adolescence: Secondary | ICD-10-CM | POA: Diagnosis not present

## 2021-10-15 DIAGNOSIS — R7309 Other abnormal glucose: Secondary | ICD-10-CM | POA: Diagnosis not present

## 2021-10-15 DIAGNOSIS — M79604 Pain in right leg: Secondary | ICD-10-CM

## 2021-10-15 DIAGNOSIS — F3342 Major depressive disorder, recurrent, in full remission: Secondary | ICD-10-CM | POA: Diagnosis not present

## 2021-10-15 DIAGNOSIS — Z0000 Encounter for general adult medical examination without abnormal findings: Secondary | ICD-10-CM

## 2021-10-15 MED ORDER — PREDNISONE 5 MG PO TABS: 5.0000 mg | ORAL_TABLET | Freq: Every day | ORAL | 0 refills | Status: DC

## 2021-10-15 NOTE — Patient Instructions (Signed)

## 2021-10-16 LAB — CBC WITH DIFFERENTIAL/PLATELET
Absolute Monocytes: 530 cells/uL (ref 200–950)
Basophils Absolute: 27 cells/uL (ref 0–200)
Basophils Relative: 0.4 %
Eosinophils Absolute: 313 cells/uL (ref 15–500)
Eosinophils Relative: 4.6 %
HCT: 44.2 % (ref 35.0–45.0)
Hemoglobin: 14.8 g/dL (ref 11.7–15.5)
Lymphs Abs: 1918 cells/uL (ref 850–3900)
MCH: 28.8 pg (ref 27.0–33.0)
MCHC: 33.5 g/dL (ref 32.0–36.0)
MCV: 86.2 fL (ref 80.0–100.0)
MPV: 10 fL (ref 7.5–12.5)
Monocytes Relative: 7.8 %
Neutro Abs: 4012 cells/uL (ref 1500–7800)
Neutrophils Relative %: 59 %
Platelets: 314 10*3/uL (ref 140–400)
RBC: 5.13 10*6/uL — ABNORMAL HIGH (ref 3.80–5.10)
RDW: 13.4 % (ref 11.0–15.0)
Total Lymphocyte: 28.2 %
WBC: 6.8 10*3/uL (ref 3.8–10.8)

## 2021-10-16 LAB — HEMOGLOBIN A1C
Hgb A1c MFr Bld: 5.5 % of total Hgb (ref <5.7)
Mean Plasma Glucose: 111 mg/dL
eAG (mmol/L): 6.2 mmol/L

## 2021-10-16 LAB — COMPLETE METABOLIC PANEL WITH GFR
AG Ratio: 2.2 (calc) (ref 1.0–2.5)
ALT: 21 U/L (ref 6–29)
AST: 21 U/L (ref 10–35)
Albumin: 4.4 g/dL (ref 3.6–5.1)
Alkaline phosphatase (APISO): 82 U/L (ref 37–153)
BUN: 14 mg/dL (ref 7–25)
CO2: 26 mmol/L (ref 20–32)
Calcium: 9.9 mg/dL (ref 8.6–10.4)
Chloride: 105 mmol/L (ref 98–110)
Creat: 0.92 mg/dL (ref 0.50–1.05)
Globulin: 2 g/dL (calc) (ref 1.9–3.7)
Glucose, Bld: 97 mg/dL (ref 65–99)
Potassium: 4.3 mmol/L (ref 3.5–5.3)
Sodium: 141 mmol/L (ref 135–146)
Total Bilirubin: 0.6 mg/dL (ref 0.2–1.2)
Total Protein: 6.4 g/dL (ref 6.1–8.1)
eGFR: 67 mL/min/{1.73_m2} (ref >=60)

## 2021-10-16 LAB — LIPID PANEL
Cholesterol: 181 mg/dL (ref <200)
HDL: 53 mg/dL (ref >=50)
LDL Cholesterol (Calc): 100 mg/dL (calc) — ABNORMAL HIGH
Non-HDL Cholesterol (Calc): 128 mg/dL (calc) (ref <130)
Total CHOL/HDL Ratio: 3.4 (calc) (ref <5.0)
Triglycerides: 181 mg/dL — ABNORMAL HIGH (ref <150)

## 2021-10-16 LAB — TSH: TSH: 0.1 mIU/L — ABNORMAL LOW (ref 0.40–4.50)

## 2021-10-18 ENCOUNTER — Encounter: Payer: Self-pay | Admitting: Nurse Practitioner

## 2021-11-02 ENCOUNTER — Other Ambulatory Visit: Payer: Self-pay | Admitting: Nurse Practitioner

## 2021-11-02 ENCOUNTER — Telehealth: Payer: Self-pay | Admitting: Nurse Practitioner

## 2021-11-02 ENCOUNTER — Encounter: Payer: Self-pay | Admitting: Nurse Practitioner

## 2021-11-02 DIAGNOSIS — F419 Anxiety disorder, unspecified: Secondary | ICD-10-CM

## 2021-11-02 MED ORDER — ALPRAZOLAM 1 MG PO TABS: ORAL_TABLET | ORAL | 0 refills | Status: DC

## 2021-11-02 NOTE — Telephone Encounter (Signed)
Pt is requesting a refill on xanax to go to [facility] on [address]

## 2021-11-08 ENCOUNTER — Telehealth: Payer: Self-pay | Admitting: *Deleted

## 2021-11-08 NOTE — Chronic Care Management (AMB) (Signed)
  Care Coordination   Note   11/08/2021 Name: Diana Wolfe MRN: [id number] DOB: [birth year]  Diana Wolfe is a 71 y.o. year old female who sees Unk Pinto, MD for primary care. I reached out to [employer] by phone today to offer care coordination services.  Ms. Blevins was given information about Care Coordination services today including:   The Care Coordination services include support from the care team which includes your Nurse Coordinator, Clinical Social Worker, or Pharmacist.  The Care Coordination team is here to help remove barriers to the health concerns and goals most important to you. Care Coordination services are voluntary, and the patient may decline or stop services at any time by request to their care team member.   Care Coordination Consent Status: Patient agreed to services and verbal consent obtained.   Follow up plan:  Telephone appointment with care coordination team member scheduled for:  11/11/21  Encounter Outcome:  Pt. Scheduled  [place]  Direct Dial: [phone]

## 2021-11-11 ENCOUNTER — Encounter: Payer: Self-pay | Admitting: *Deleted

## 2021-11-11 ENCOUNTER — Ambulatory Visit: Payer: Self-pay | Admitting: *Deleted

## 2021-11-11 NOTE — Patient Outreach (Signed)
  Care Coordination   Initial Visit Note   11/11/2021 Name: Diana Wolfe MRN: [id number] DOB: Nov 07, 1950  Diana Wolfe is a 71 y.o. year old female who sees Unk Pinto, MD for primary care. I spoke with  Diana Wolfe by phone today.  What matters to the patients health and wellness today?  Mental health support- need counseling options    Goals Addressed             This Visit's Progress    To get more counseling (both in-person and virtual)       Care Coordination Interventions:  Pt reports history of depression and anxiety- has been seeing a counselor but wants to consider a change Pt endorses worry around thoughts of retirement and what that may do to worsen her depression and finances Pt admits to feelings of sadness and loneliness Depression screen reviewed  PHQ2/PHQ9 completed Solution-Focused Strategies employed:  Active listening / Reflection utilized  Problem Solving [place] strategies reviewed Provided psychoeducation for mental health needs  Participation in continuing with counseling encouraged  Participation in support group encouraged (attended the [employer] group) Pt reports some negative self-talk Encouraged pt to inquire with [employer] re: new grant monies and to consider other options for counseling; email to be sent with providers in-network  Suicidal Ideation/Homicidal Ideation assessed: denies SI/HI         SDOH assessments and interventions completed:  Yes  SDOH Interventions Today    Flowsheet Row Most Recent Value  SDOH Interventions   Food Insecurity Interventions Intervention Not Indicated  Housing Interventions Intervention Not Indicated  Transportation Interventions Intervention Not Indicated  Utilities Interventions Intervention Not Indicated  Depression Interventions/Treatment  Medication, Counseling, Referral to Psychiatry  Financial Strain Interventions Intervention Not Indicated   Social Connections Interventions Other (Comment)  [[employer] support group for [place] Coordination Interventions Activated:  Yes  Care Coordination Interventions:  Yes, provided   Follow up plan: Follow up call scheduled for 11/25/21    Encounter Outcome:  Pt. Visit Completed

## 2021-11-11 NOTE — Patient Instructions (Signed)
Visit Information  Thank you for taking time to visit with me today. Please don't hesitate to contact me if I can be of assistance to you.   Following are the goals we discussed today:   Goals Addressed             This Visit's Progress    To get more counseling (both in-person and virtual)       Care Coordination Interventions:  Pt reports history of depression and anxiety- has been seeing a counselor but wants to consider a change Pt endorses worry around thoughts of retirement and what that may do to worsen her depression and finances Pt admits to feelings of sadness and loneliness Depression screen reviewed  PHQ2/PHQ9 completed Solution-Focused Strategies employed:  Active listening / Reflection utilized  Problem [place] strategies reviewed Provided psychoeducation for mental health needs  Participation in continuing with counseling encouraged  Participation in support group encouraged (attended the [employer] women's group) Pt reports some negative self-talk Encouraged pt to inquire with [employer] re: new grant monies and to consider other options for counseling; email to be sent with providers in-network  Suicidal Ideation/Homicidal Ideation assessed: denies SI/HI         Our next appointment is by telephone on 11/25/21 at 11  Please call the care guide team at [phone] if you need to cancel or reschedule your appointment.   If you are experiencing a Mental Health or [place] or need someone to talk to, please call the [place] National Suicide Prevention Lifeline: (647)771-1351 or TTY: (305) 172-6239 TTY 603 830 4404) to talk to a trained counselor call 911   Patient verbalizes understanding of instructions and care plan provided today and agrees to view in [place]. Active MyChart status and patient understanding of how to access instructions and care plan via MyChart confirmed with patient.     Telephone follow up  appointment with care management team member scheduled for:11/25/21  Eduard Clos MSW, LCSW Licensed Clinical Social Worker      [phone]

## 2021-11-12 ENCOUNTER — Encounter: Payer: Self-pay | Admitting: Nurse Practitioner

## 2021-11-14 ENCOUNTER — Other Ambulatory Visit: Payer: Self-pay | Admitting: Nurse Practitioner

## 2021-11-14 DIAGNOSIS — F988 Other specified behavioral and emotional disorders with onset usually occurring in childhood and adolescence: Secondary | ICD-10-CM

## 2021-11-14 MED ORDER — AMPHETAMINE-DEXTROAMPHETAMINE 30 MG PO TABS: 30.0000 mg | ORAL_TABLET | Freq: Every day | ORAL | 0 refills | Status: DC

## 2021-11-15 ENCOUNTER — Other Ambulatory Visit: Payer: Self-pay | Admitting: Nurse Practitioner

## 2021-11-15 ENCOUNTER — Telehealth: Payer: Self-pay | Admitting: Nurse Practitioner

## 2021-11-15 DIAGNOSIS — E039 Hypothyroidism, unspecified: Secondary | ICD-10-CM

## 2021-11-15 NOTE — Telephone Encounter (Signed)
Please schedule a nurse visit to have labs drawn for TSH. Thank you.

## 2021-11-15 NOTE — Telephone Encounter (Signed)
PT SAID SHE HAD LABS DONE LAST MONTH AND SHE SAID IN HER NOTES TALKING WITH [name] THAT SHE NEEDED TO DO LABS TO CK HER TSH SINCE IT HAS BEEN 30 DAYS FROM TAKING HER SYNTHROID AND SHE NEED TO DO LABS TO CK HER THYROID LEVELS BUT I DID NOT SEE ANYTHING IN HER LABS RESULT TO SAY TO [place] THIS-PLEASE LET ME KNOW SO IF I DO NEED TO [place] THIS I CAN CALL PT BACK- PHONE NUMBER [phone].

## 2021-11-16 DIAGNOSIS — Z23 Encounter for immunization: Secondary | ICD-10-CM | POA: Diagnosis not present

## 2021-11-17 ENCOUNTER — Ambulatory Visit (INDEPENDENT_AMBULATORY_CARE_PROVIDER_SITE_OTHER): Payer: Medicare Other

## 2021-11-17 VITALS — BP 140/82 | HR 80 | Temp 97.9°F | Resp 17 | Ht 62.0 in | Wt 157.6 lb

## 2021-11-17 DIAGNOSIS — E039 Hypothyroidism, unspecified: Secondary | ICD-10-CM | POA: Diagnosis not present

## 2021-11-17 NOTE — Progress Notes (Signed)
The patient returns for repeat TSH. She reports she is not taking any biotin or biotin containing products at this time. The patient had no new complaints today.

## 2021-11-18 ENCOUNTER — Encounter: Payer: Self-pay | Admitting: Nurse Practitioner

## 2021-11-18 LAB — TSH: TSH: 30.07 mIU/L — ABNORMAL HIGH (ref 0.40–4.50)

## 2021-11-22 ENCOUNTER — Other Ambulatory Visit: Payer: Self-pay | Admitting: Nurse Practitioner

## 2021-11-22 MED ORDER — LEVOTHYROXINE SODIUM 25 MCG PO TABS: ORAL_TABLET | ORAL | 0 refills | Status: DC

## 2021-11-25 ENCOUNTER — Ambulatory Visit: Payer: Self-pay | Admitting: *Deleted

## 2021-11-25 NOTE — Patient Outreach (Signed)
  Care Coordination   Follow Up Visit Note   11/25/2021 Name: ALDONIA KEEVEN MRN: [id number] DOB: 08/12/1950  Bartolo Darter is a 71 y.o. year old female who sees Unk Pinto, MD for primary care. I spoke with  Bartolo Darter by phone today.  What matters to the patients health and wellness today?  Getting help with mental health needs.     Goals Addressed   None     SDOH assessments and interventions completed:  Yes     Care Coordination Interventions Activated:  Yes  Care Coordination Interventions:  Yes, provided   Follow up plan: Follow up call scheduled for 12/02/21    Encounter Outcome:  Pt. Visit Completed

## 2021-11-25 NOTE — Patient Instructions (Signed)
Visit Information  Thank you for taking time to visit with me today. Please don't hesitate to contact me if I can be of assistance to you.   Following are the goals we discussed today:   Goals Addressed   None     Our next appointment is by telephone on 12/02/21   Please call the care guide team at [phone] if you need to cancel or reschedule your appointment.   If you are experiencing a Mental Health or [place] or need someone to talk to, please call the Suicide and Crisis Lifeline: 988 call 911   Patient verbalizes understanding of instructions and care plan provided today and agrees to view in [place]. Active MyChart status and patient understanding of how to access instructions and care plan via MyChart confirmed with patient.     Telephone follow up appointment with care management team member scheduled for:12/02/21  Eduard Clos MSW, LCSW Licensed Clinical Social Worker      [phone]

## 2021-11-26 DIAGNOSIS — F332 Major depressive disorder, recurrent severe without psychotic features: Secondary | ICD-10-CM | POA: Diagnosis not present

## 2021-11-29 DIAGNOSIS — F332 Major depressive disorder, recurrent severe without psychotic features: Secondary | ICD-10-CM | POA: Diagnosis not present

## 2021-11-30 DIAGNOSIS — F332 Major depressive disorder, recurrent severe without psychotic features: Secondary | ICD-10-CM | POA: Diagnosis not present

## 2021-12-01 DIAGNOSIS — F332 Major depressive disorder, recurrent severe without psychotic features: Secondary | ICD-10-CM | POA: Diagnosis not present

## 2021-12-02 ENCOUNTER — Ambulatory Visit: Payer: Self-pay | Admitting: *Deleted

## 2021-12-02 DIAGNOSIS — F332 Major depressive disorder, recurrent severe without psychotic features: Secondary | ICD-10-CM | POA: Diagnosis not present

## 2021-12-02 NOTE — Patient Instructions (Signed)
Visit Information  Thank you for taking time to visit with me today. Please don't hesitate to contact me if I can be of assistance to you.   Following are the goals we discussed today:   Goals Addressed             This Visit's Progress    COMPLETED: To get more counseling (both in-person and virtual)       Care Coordination Interventions:  Pt with history of depression and anxiety- has been seeing a counselor but wants to consider a change- she plans to look into some of the resources we have provided and will reach out if further needs arise. Pt reports she will be getting her 5th "[place]" treatment today- states it is uncomfortable/some pain Pt plans to also see her "Peer Support" person through the [facility] for additional support/counseling,etc Pt endorses worry around thoughts of retirement and what that may do to worsen her depression and finances Pt admits to feelings of sadness and loneliness Depression screen reviewed  PHQ2/PHQ9 completed Solution-Focused Strategies employed:  Active listening / Reflection utilized  Problem [place] strategies reviewed Provided psychoeducation for mental health needs  Participation in continuing with counseling encouraged  Participation in support group encouraged (attended the [employer] women's group) Pt reports some negative self-talk Encouraged pt to inquire with [employer] re: new grant monies and to consider other options for counseling; email to be sent with providers in-network  Suicidal Ideation/Homicidal Ideation assessed: denies SI/HI         Please call the care guide team at [phone] if you need to schedule further appointment.   If you are experiencing a Mental Health or [place] or need someone to talk to, please call the Suicide and Crisis Lifeline: 988 call the [place] National Suicide Prevention Lifeline: 530 168 4577 or TTY: 661-321-4009 TTY 437-572-5130) to talk  to a trained counselor call 911   Patient verbalizes understanding of instructions and care plan provided today and agrees to view in [place]. Active MyChart status and patient understanding of how to access instructions and care plan via MyChart confirmed with patient.     No further follow up required:    Eduard Clos MSW, LCSW Licensed Clinical Social Worker      [phone]

## 2021-12-02 NOTE — Patient Outreach (Signed)
  Care Coordination   Follow Up Visit Note   12/02/2021 Name: Diana Wolfe MRN: [id number] DOB: 1950/05/31  Diana Wolfe is a 71 y.o. year old female who sees Unk Pinto, MD for primary care. I spoke with  Diana Wolfe by phone today.  What matters to the patients health and wellness today?  COntinue with outpatient support networks to reduce symptoms depression    Goals Addressed             This Visit's Progress    COMPLETED: To get more counseling (both in-person and virtual)       Care Coordination Interventions:  Pt with history of depression and anxiety- has been seeing a counselor but wants to consider a change- she plans to look into some of the resources we have provided and will reach out if further needs arise. Pt reports she will be getting her 5th "[place]" treatment today- states it is uncomfortable/some pain Pt plans to also see her "Peer Support" person through the [facility] for additional support/counseling,etc Pt endorses worry around thoughts of retirement and what that may do to worsen her depression and finances Pt admits to feelings of sadness and loneliness Depression screen reviewed  PHQ2/PHQ9 completed Solution-Focused Strategies employed:  Active listening / Reflection utilized  Problem Solving [place] strategies reviewed Provided psychoeducation for mental health needs  Participation in continuing with counseling encouraged  Participation in support group encouraged (attended the [employer] group) Pt reports some negative self-talk Encouraged pt to inquire with [employer] re: new grant monies and to consider other options for counseling; email to be sent with providers in-network  Suicidal Ideation/Homicidal Ideation assessed: denies SI/HI         SDOH assessments and interventions completed:  Yes     Care Coordination Interventions Activated:  Yes  Care Coordination Interventions:  Yes,  provided   Follow up plan: No further intervention required.   Encounter Outcome:  Pt. Visit Completed

## 2021-12-03 DIAGNOSIS — F332 Major depressive disorder, recurrent severe without psychotic features: Secondary | ICD-10-CM | POA: Diagnosis not present

## 2021-12-06 DIAGNOSIS — F332 Major depressive disorder, recurrent severe without psychotic features: Secondary | ICD-10-CM | POA: Diagnosis not present

## 2021-12-07 DIAGNOSIS — F332 Major depressive disorder, recurrent severe without psychotic features: Secondary | ICD-10-CM | POA: Diagnosis not present

## 2021-12-08 DIAGNOSIS — F332 Major depressive disorder, recurrent severe without psychotic features: Secondary | ICD-10-CM | POA: Diagnosis not present

## 2021-12-09 DIAGNOSIS — F332 Major depressive disorder, recurrent severe without psychotic features: Secondary | ICD-10-CM | POA: Diagnosis not present

## 2021-12-10 DIAGNOSIS — F332 Major depressive disorder, recurrent severe without psychotic features: Secondary | ICD-10-CM | POA: Diagnosis not present

## 2021-12-13 DIAGNOSIS — F332 Major depressive disorder, recurrent severe without psychotic features: Secondary | ICD-10-CM | POA: Diagnosis not present

## 2021-12-14 DIAGNOSIS — F332 Major depressive disorder, recurrent severe without psychotic features: Secondary | ICD-10-CM | POA: Diagnosis not present

## 2021-12-15 ENCOUNTER — Encounter: Payer: Self-pay | Admitting: Nurse Practitioner

## 2021-12-15 DIAGNOSIS — F332 Major depressive disorder, recurrent severe without psychotic features: Secondary | ICD-10-CM | POA: Diagnosis not present

## 2021-12-16 ENCOUNTER — Other Ambulatory Visit: Payer: Self-pay | Admitting: Nurse Practitioner

## 2021-12-16 DIAGNOSIS — F332 Major depressive disorder, recurrent severe without psychotic features: Secondary | ICD-10-CM | POA: Diagnosis not present

## 2021-12-16 MED ORDER — BUSPIRONE HCL 5 MG PO TABS: 5.0000 mg | ORAL_TABLET | Freq: Three times a day (TID) | ORAL | 0 refills | Status: DC

## 2021-12-20 DIAGNOSIS — F332 Major depressive disorder, recurrent severe without psychotic features: Secondary | ICD-10-CM | POA: Diagnosis not present

## 2021-12-21 DIAGNOSIS — F332 Major depressive disorder, recurrent severe without psychotic features: Secondary | ICD-10-CM | POA: Diagnosis not present

## 2021-12-22 DIAGNOSIS — F332 Major depressive disorder, recurrent severe without psychotic features: Secondary | ICD-10-CM | POA: Diagnosis not present

## 2021-12-23 ENCOUNTER — Other Ambulatory Visit: Payer: Self-pay | Admitting: Nurse Practitioner

## 2021-12-23 DIAGNOSIS — F332 Major depressive disorder, recurrent severe without psychotic features: Secondary | ICD-10-CM | POA: Diagnosis not present

## 2021-12-23 DIAGNOSIS — F419 Anxiety disorder, unspecified: Secondary | ICD-10-CM

## 2021-12-24 DIAGNOSIS — H43393 Other vitreous opacities, bilateral: Secondary | ICD-10-CM | POA: Diagnosis not present

## 2021-12-24 DIAGNOSIS — F332 Major depressive disorder, recurrent severe without psychotic features: Secondary | ICD-10-CM | POA: Diagnosis not present

## 2021-12-27 DIAGNOSIS — F332 Major depressive disorder, recurrent severe without psychotic features: Secondary | ICD-10-CM | POA: Diagnosis not present

## 2021-12-28 DIAGNOSIS — F332 Major depressive disorder, recurrent severe without psychotic features: Secondary | ICD-10-CM | POA: Diagnosis not present

## 2021-12-29 ENCOUNTER — Telehealth: Payer: Self-pay | Admitting: Nurse Practitioner

## 2021-12-29 DIAGNOSIS — F332 Major depressive disorder, recurrent severe without psychotic features: Secondary | ICD-10-CM | POA: Diagnosis not present

## 2021-12-29 NOTE — Telephone Encounter (Signed)
Pt is requesting a refill on synthroid but the pharmacy is saying she should not be due for a refill. Pt is saying based off of a mychart convo in September, tonya changed how she was changing her medicine and it didn't reflect that change on the bottle.

## 2021-12-30 DIAGNOSIS — F332 Major depressive disorder, recurrent severe without psychotic features: Secondary | ICD-10-CM | POA: Diagnosis not present

## 2021-12-30 NOTE — Telephone Encounter (Signed)
Pt is following up on refill request, says she will be out by this weekend

## 2021-12-31 ENCOUNTER — Other Ambulatory Visit: Payer: Self-pay | Admitting: Nurse Practitioner

## 2021-12-31 ENCOUNTER — Other Ambulatory Visit: Payer: Self-pay

## 2021-12-31 DIAGNOSIS — R7989 Other specified abnormal findings of blood chemistry: Secondary | ICD-10-CM

## 2021-12-31 DIAGNOSIS — E039 Hypothyroidism, unspecified: Secondary | ICD-10-CM

## 2021-12-31 DIAGNOSIS — F332 Major depressive disorder, recurrent severe without psychotic features: Secondary | ICD-10-CM | POA: Diagnosis not present

## 2021-12-31 MED ORDER — LEVOTHYROXINE SODIUM 25 MCG PO TABS: ORAL_TABLET | ORAL | 0 refills | Status: DC

## 2022-01-03 DIAGNOSIS — F332 Major depressive disorder, recurrent severe without psychotic features: Secondary | ICD-10-CM | POA: Diagnosis not present

## 2022-01-04 DIAGNOSIS — F332 Major depressive disorder, recurrent severe without psychotic features: Secondary | ICD-10-CM | POA: Diagnosis not present

## 2022-01-05 ENCOUNTER — Other Ambulatory Visit: Payer: Medicare Other

## 2022-01-05 ENCOUNTER — Other Ambulatory Visit: Payer: Self-pay

## 2022-01-05 DIAGNOSIS — R7989 Other specified abnormal findings of blood chemistry: Secondary | ICD-10-CM | POA: Diagnosis not present

## 2022-01-05 DIAGNOSIS — E039 Hypothyroidism, unspecified: Secondary | ICD-10-CM | POA: Diagnosis not present

## 2022-01-05 DIAGNOSIS — F332 Major depressive disorder, recurrent severe without psychotic features: Secondary | ICD-10-CM | POA: Diagnosis not present

## 2022-01-06 ENCOUNTER — Other Ambulatory Visit: Payer: Self-pay | Admitting: Nurse Practitioner

## 2022-01-06 DIAGNOSIS — R7989 Other specified abnormal findings of blood chemistry: Secondary | ICD-10-CM

## 2022-01-06 DIAGNOSIS — F332 Major depressive disorder, recurrent severe without psychotic features: Secondary | ICD-10-CM | POA: Diagnosis not present

## 2022-01-06 DIAGNOSIS — E039 Hypothyroidism, unspecified: Secondary | ICD-10-CM

## 2022-01-06 LAB — TSH: TSH: 28.13 mIU/L — ABNORMAL HIGH (ref 0.40–4.50)

## 2022-01-07 DIAGNOSIS — F332 Major depressive disorder, recurrent severe without psychotic features: Secondary | ICD-10-CM | POA: Diagnosis not present

## 2022-01-10 DIAGNOSIS — F332 Major depressive disorder, recurrent severe without psychotic features: Secondary | ICD-10-CM | POA: Diagnosis not present

## 2022-01-11 DIAGNOSIS — F332 Major depressive disorder, recurrent severe without psychotic features: Secondary | ICD-10-CM | POA: Diagnosis not present

## 2022-01-12 DIAGNOSIS — F332 Major depressive disorder, recurrent severe without psychotic features: Secondary | ICD-10-CM | POA: Diagnosis not present

## 2022-01-15 ENCOUNTER — Other Ambulatory Visit: Payer: Self-pay | Admitting: Internal Medicine

## 2022-01-15 ENCOUNTER — Encounter: Payer: Self-pay | Admitting: Nurse Practitioner

## 2022-01-15 DIAGNOSIS — E039 Hypothyroidism, unspecified: Secondary | ICD-10-CM

## 2022-01-15 MED ORDER — LEVOTHYROXINE SODIUM 75 MCG PO TABS: ORAL_TABLET | ORAL | 1 refills | Status: DC

## 2022-01-17 DIAGNOSIS — F332 Major depressive disorder, recurrent severe without psychotic features: Secondary | ICD-10-CM | POA: Diagnosis not present

## 2022-01-18 DIAGNOSIS — F332 Major depressive disorder, recurrent severe without psychotic features: Secondary | ICD-10-CM | POA: Diagnosis not present

## 2022-01-19 ENCOUNTER — Other Ambulatory Visit: Payer: Self-pay | Admitting: Nurse Practitioner

## 2022-01-24 DIAGNOSIS — F332 Major depressive disorder, recurrent severe without psychotic features: Secondary | ICD-10-CM | POA: Diagnosis not present

## 2022-01-26 DIAGNOSIS — G4733 Obstructive sleep apnea (adult) (pediatric): Secondary | ICD-10-CM | POA: Diagnosis not present

## 2022-01-27 ENCOUNTER — Telehealth: Payer: Self-pay | Admitting: Nurse Practitioner

## 2022-01-27 ENCOUNTER — Other Ambulatory Visit: Payer: Self-pay | Admitting: Nurse Practitioner

## 2022-01-27 DIAGNOSIS — F988 Other specified behavioral and emotional disorders with onset usually occurring in childhood and adolescence: Secondary | ICD-10-CM

## 2022-01-27 MED ORDER — AMPHETAMINE-DEXTROAMPHETAMINE 30 MG PO TABS: 30.0000 mg | ORAL_TABLET | Freq: Every day | ORAL | 0 refills | Status: DC

## 2022-01-27 NOTE — Telephone Encounter (Signed)
Pt is requesting refill on Adderall to go to [facility]

## 2022-01-28 ENCOUNTER — Other Ambulatory Visit: Payer: Self-pay | Admitting: Nurse Practitioner

## 2022-02-06 ENCOUNTER — Other Ambulatory Visit: Payer: Self-pay | Admitting: Nurse Practitioner

## 2022-02-06 DIAGNOSIS — E559 Vitamin D deficiency, unspecified: Secondary | ICD-10-CM

## 2022-02-08 ENCOUNTER — Other Ambulatory Visit: Payer: Self-pay | Admitting: Nurse Practitioner

## 2022-02-08 ENCOUNTER — Encounter: Payer: Self-pay | Admitting: Nurse Practitioner

## 2022-02-08 NOTE — Telephone Encounter (Signed)
I refilled her Buspar this morning

## 2022-02-09 ENCOUNTER — Other Ambulatory Visit: Payer: Self-pay

## 2022-02-09 DIAGNOSIS — M542 Cervicalgia: Secondary | ICD-10-CM

## 2022-02-09 MED ORDER — MELOXICAM 15 MG PO TABS: ORAL_TABLET | ORAL | 1 refills | Status: DC

## 2022-02-17 NOTE — Progress Notes (Signed)
Assessment and Plan:  Diana Wolfe was seen today for acute visit.  Diagnoses and all orders for this visit:  Essential hypertension - continue medications, DASH diet, exercise and monitor at home. Call if greater than 130/80.  Go to the ER if any chest pain, shortness of breath, nausea, dizziness, severe HA, changes vision/speech  Follow up in 2 weeks with BP log -     losartan (COZAAR) 50 MG tablet; Take 1 tablet (50 mg total) by mouth daily.  Anxiety Continue relaxation exercises, increase Buspar -     busPIRone (BUSPAR) 7.5 MG tablet; Take 1 tablet (7.5 mg total) by mouth 3 (three) times daily.  Hypothyroidism, unspecified type Continue current levothyroxine dose and will adjust depending on reults -     TSH  Tenderness of head and neck Avoid repetitive pressure on head- do not touch for the next several days to determine if pain persists Unable to feel any masses, neurological exam is normal If gets severe headache or loss of vision she is to go to the ER, otherwise will reevaluate in 2 weeks       Further disposition pending results of labs. Discussed med's effects and SE's.   Over 30 minutes of exam, counseling, chart review, and critical decision making was performed.   Future Appointments  Date Time Provider Department Center  03/24/2022  3:00 PM Diana Dick, NP [facility] None  10/18/2022 11:00 AM Diana Dick, NP [facility] None    ------------------------------------------------------------------------------------------------------------------   HPI BP (!) 192/88   Pulse 82   Temp (!) 97.5 F (36.4 C)   Ht 5\' 2"  (1.575 m)   Wt 158 lb 3.2 oz (71.8 kg)   SpO2 96%   BMI 28.94 kg/m   71 y.o.female presents for complaint of pain on the back of her head that hurts when pushed and will radiate around to the left eye. Believes a lot of her symptoms started when she stopped all her thyroid medications.  She did 30 TMS sessions for depression, has finished  and has noted some improvement but has not completely resolved. Her anxiety persists- has just started taking the Buspar to three times a day. Continues to have issues with anxiety  She is currently on no BP medication, When checking at home it varies between 130/80- 180/90, has been on medication in the past.  BP Readings from Last 3 Encounters:  02/18/22 (!) 192/88  11/17/21 (!) 140/82  10/15/21 130/80   BMI is Body mass index is 28.94 kg/m., she has been working on diet and exercise. Wt Readings from Last 3 Encounters:  02/18/22 158 lb 3.2 oz (71.8 kg)  11/17/21 157 lb 9.6 oz (71.5 kg)  10/15/21 150 lb (68 kg)   She is currently on 75 mcg daily of Levothyroxine Lab Results  Component Value Date   TSH 28.13 (H) 01/05/2022     Past Medical History:  Diagnosis Date   ADD (attention deficit disorder)    Anxiety    Depression    GERD (gastroesophageal reflux disease)    Hyperlipidemia    Hypertension    Hypothyroid    PONV (postoperative nausea and vomiting)    Vitamin D deficiency      Allergies  Allergen Reactions   Levofloxacin     Neck sweling/difficult breathing   Cefprozil    Codeine     REACTION: vomiting   Hydrocodone-Acetaminophen    Morphine    Nitrofuran Derivatives Itching   Pravastatin Swelling   Lexapro [Escitalopram  Oxalate] Other (See Comments)    Tapping/abnormal movements    Current Outpatient Medications on File Prior to Visit  Medication Sig   ALPRAZolam (XANAX) 1 MG tablet TAKE 1/2 TO 1 TABLET BY MOUTH EVERY NIGHT AT BEDTIME IF NEEDED FOR SLEEP AND LIMIT TO 5 DAYS PER WEEK TO AVOID ADDICTION AND DEMENTIA   amoxicillin (AMOXIL) 500 MG tablet Take 1 tablet (500 mg total) by mouth 3 (three) times daily. 10 days   amphetamine-dextroamphetamine (ADDERALL) 30 MG tablet Take 1 tablet by mouth daily.   aspirin 81 MG chewable tablet 3 days a week   busPIRone (BUSPAR) 5 MG tablet TAKE ONE TABLET BY MOUTH THREE TIMES A DAY   cetirizine (ZYRTEC) 10 MG  tablet 1 tablet   Cyanocobalamin (B-12) 1000 MCG TABS Take by mouth.   levothyroxine (SYNTHROID) 75 MCG tablet Take  1 tablet  Daily  on an empty stomach with only water for 30 minutes & no Antacid meds, Calcium or Magnesium for 4 hours & avoid Biotin   lidocaine (LIDODERM) 5 % Place 1 patch onto the skin daily. Remove & Discard patch within 12 hours or as directed by MD (Patient taking differently: Place 1 patch onto the skin daily. Remove & Discard patch within 12 hours or as directed by MD PRN)   meloxicam (MOBIC) 15 MG tablet TAKE 1/2 TO 1 TABLET BY MOUTH DAILY WITH FOOD FOR PAIN AND INFLAMMATION   omeprazole (PRILOSEC) 40 MG capsule TAKE ONE CAPSULE BY MOUTH DAILY TO PREVENT HEARTBURN AND INDIGESTION   predniSONE (DELTASONE) 5 MG tablet Take 1 tablet (5 mg total) by mouth daily.   triamcinolone cream (KENALOG) 0.5 % Apply 1 application topically 2 (two) times daily.   vitamin C (ASCORBIC ACID) 500 MG tablet Take 1,200 mg by mouth daily.   Vitamin D, Ergocalciferol, (DRISDOL) 1.25 MG (50000 UNIT) CAPS capsule TAKE 1 CAPSULE BY MOUTH 3 TIMES A WEEK ON MONDAY- WEDNESDAY- FRIDAY FOR SEVERE VITAMIN D. DEFICIENCY   No current facility-administered medications on file prior to visit.    ROS: all negative except above.   Physical Exam:  BP (!) 192/88   Pulse 82   Temp (!) 97.5 F (36.4 C)   Ht 5\' 2"  (1.575 m)   Wt 158 lb 3.2 oz (71.8 kg)   SpO2 96%   BMI 28.94 kg/m   General Appearance: Well nourished, in no apparent distress. Eyes: PERRLA, EOMs, conjunctiva no swelling or erythema Sinuses: No Frontal/maxillary tenderness ENT/Mouth: Ext aud canals clear, TMs without erythema, bulging. No erythema, swelling, or exudate on post pharynx.  Tonsils not swollen or erythematous. Hearing normal.  Neck: Supple, thyroid normal.  Respiratory: Respiratory effort normal, BS equal bilaterally without rales, rhonchi, wheezing or stridor.  Cardio: RRR with no MRGs. Brisk peripheral pulses without  edema.  Abdomen: Soft, + BS.  Non tender, no guarding, rebound, hernias, masses. Lymphatics: Non tender without lymphadenopathy.  Musculoskeletal: Full ROM, 5/5 strength, normal gait.  Skin: Warm, dry without rashes, lesions, ecchymosis.  Neuro: Cranial nerves intact. Normal muscle tone, no cerebellar symptoms. Sensation intact.  Psych: Awake and oriented X 3, normal affect, Insight and Judgment appropriate.     , NP 11:39 AM [facility]

## 2022-02-18 ENCOUNTER — Ambulatory Visit (INDEPENDENT_AMBULATORY_CARE_PROVIDER_SITE_OTHER): Payer: Medicare Other | Admitting: Nurse Practitioner

## 2022-02-18 ENCOUNTER — Encounter: Payer: Self-pay | Admitting: Nurse Practitioner

## 2022-02-18 VITALS — BP 192/88 | HR 82 | Temp 97.5°F | Ht 62.0 in | Wt 158.2 lb

## 2022-02-18 DIAGNOSIS — R519 Headache, unspecified: Secondary | ICD-10-CM

## 2022-02-18 DIAGNOSIS — E039 Hypothyroidism, unspecified: Secondary | ICD-10-CM | POA: Diagnosis not present

## 2022-02-18 DIAGNOSIS — F419 Anxiety disorder, unspecified: Secondary | ICD-10-CM

## 2022-02-18 DIAGNOSIS — M542 Cervicalgia: Secondary | ICD-10-CM | POA: Diagnosis not present

## 2022-02-18 DIAGNOSIS — I10 Essential (primary) hypertension: Secondary | ICD-10-CM | POA: Diagnosis not present

## 2022-02-18 MED ORDER — BUSPIRONE HCL 7.5 MG PO TABS: 7.5000 mg | ORAL_TABLET | Freq: Three times a day (TID) | ORAL | 3 refills | Status: DC

## 2022-02-18 MED ORDER — LOSARTAN POTASSIUM 50 MG PO TABS: 50.0000 mg | ORAL_TABLET | Freq: Every day | ORAL | 11 refills | Status: DC

## 2022-02-18 NOTE — Patient Instructions (Signed)
Please check your BP daily and keep log and bring to visit in 2 weeks.   Start on Losartan 50 mg QD- can take at night.   Hypertension, Adult High blood pressure (hypertension) is when the force of blood pumping through the arteries is too strong. The arteries are the blood vessels that carry blood from the heart throughout the body. Hypertension forces the heart to work harder to pump blood and may cause arteries to become narrow or stiff. Untreated or uncontrolled hypertension can lead to a heart attack, heart failure, a stroke, kidney disease, and other problems. A blood pressure reading consists of a higher number over a lower number. Ideally, your blood pressure should be below 120/80. The first ("top") number is called the systolic pressure. It is a measure of the pressure in your arteries as your heart beats. The second ("bottom") number is called the diastolic pressure. It is a measure of the pressure in your arteries as the heart relaxes. What are the causes? The exact cause of this condition is not known. There are some conditions that result in high blood pressure. What increases the risk? Certain factors may make you more likely to develop high blood pressure. Some of these risk factors are under your control, including: Smoking. Not getting enough exercise or physical activity. Being overweight. Having too much fat, sugar, calories, or salt (sodium) in your diet. Drinking too much alcohol. Other risk factors include: Having a personal history of heart disease, diabetes, high cholesterol, or kidney disease. Stress. Having a family history of high blood pressure and high cholesterol. Having obstructive sleep apnea. Age. The risk increases with age. What are the signs or symptoms? High blood pressure may not cause symptoms. Very high blood pressure (hypertensive crisis) may cause: Headache. Fast or irregular heartbeats (palpitations). Shortness of breath. Nosebleed. Nausea and  vomiting. Vision changes. Severe chest pain, dizziness, and seizures. How is this diagnosed? This condition is diagnosed by measuring your blood pressure while you are seated, with your arm resting on a flat surface, your legs uncrossed, and your feet flat on the floor. The cuff of the blood pressure monitor will be placed directly against the skin of your upper arm at the level of your heart. Blood pressure should be measured at least twice using the same arm. Certain conditions can cause a difference in blood pressure between your right and left arms. If you have a high blood pressure reading during one visit or you have normal blood pressure with other risk factors, you may be asked to: Return on a different day to have your blood pressure checked again. Monitor your blood pressure at home for 1 week or longer. If you are diagnosed with hypertension, you may have other blood or imaging tests to help your health care provider understand your overall risk for other conditions. How is this treated? This condition is treated by making healthy lifestyle changes, such as eating healthy foods, exercising more, and reducing your alcohol intake. You may be referred for counseling on a healthy diet and physical activity. Your health care provider may prescribe medicine if lifestyle changes are not enough to get your blood pressure under control and if: Your systolic blood pressure is above 130. Your diastolic blood pressure is above 80. Your personal target blood pressure may vary depending on your medical conditions, your age, and other factors. Follow these instructions at home: Eating and drinking  Eat a diet that is high in fiber and potassium, and low in  sodium, added sugar, and fat. An example of this eating plan is called the DASH diet. DASH stands for Dietary Approaches to Stop Hypertension. To eat this way: Eat plenty of fresh fruits and vegetables. Try to fill one half of your plate at each  meal with fruits and vegetables. Eat whole grains, such as whole-wheat pasta, brown rice, or whole-grain bread. Fill about one fourth of your plate with whole grains. Eat or drink low-fat dairy products, such as skim milk or low-fat yogurt. Avoid fatty cuts of meat, processed or cured meats, and poultry with skin. Fill about one fourth of your plate with lean proteins, such as fish, chicken without skin, beans, eggs, or tofu. Avoid pre-made and processed foods. These tend to be higher in sodium, added sugar, and fat. Reduce your daily sodium intake. Many people with hypertension should eat less than 1,500 mg of sodium a day. Do not drink alcohol if: Your health care provider tells you not to drink. You are pregnant, may be pregnant, or are planning to become pregnant. If you drink alcohol: Limit how much you have to: 0-1 drink a day for women. 0-2 drinks a day for men. Know how much alcohol is in your drink. In the [place] one drink equals one 12 oz bottle of beer (355 mL), one 5 oz glass of wine (148 mL), or one 1 oz glass of hard liquor (44 mL). Lifestyle  Work with your health care provider to maintain a healthy body weight or to lose weight. Ask what an ideal weight is for you. Get at least 30 minutes of exercise that causes your heart to beat faster (aerobic exercise) most days of the week. Activities may include walking, swimming, or biking. Include exercise to strengthen your muscles (resistance exercise), such as Pilates or lifting weights, as part of your weekly exercise routine. Try to do these types of exercises for 30 minutes at least 3 days a week. Do not use any products that contain nicotine or tobacco. These products include cigarettes, chewing tobacco, and vaping devices, such as e-cigarettes. If you need help quitting, ask your health care provider. Monitor your blood pressure at home as told by your health care provider. Keep all follow-up visits. This is  important. Medicines Take over-the-counter and prescription medicines only as told by your health care provider. Follow directions carefully. Blood pressure medicines must be taken as prescribed. Do not skip doses of blood pressure medicine. Doing this puts you at risk for problems and can make the medicine less effective. Ask your health care provider about side effects or reactions to medicines that you should watch for. Contact a health care provider if you: Think you are having a reaction to a medicine you are taking. Have headaches that keep coming back (recurring). Feel dizzy. Have swelling in your ankles. Have trouble with your vision. Get help right away if you: Develop a severe headache or confusion. Have unusual weakness or numbness. Feel faint. Have severe pain in your chest or abdomen. Vomit repeatedly. Have trouble breathing. These symptoms may be an emergency. Get help right away. Call 911. Do not wait to see if the symptoms will go away. Do not drive yourself to the hospital. Summary Hypertension is when the force of blood pumping through your arteries is too strong. If this condition is not controlled, it may put you at risk for serious complications. Your personal target blood pressure may vary depending on your medical conditions, your age, and other factors. For most  people, a normal blood pressure is less than 120/80. Hypertension is treated with lifestyle changes, medicines, or a combination of both. Lifestyle changes include losing weight, eating a healthy, low-sodium diet, exercising more, and limiting alcohol. This information is not intended to replace advice given to you by your health care provider. Make sure you discuss any questions you have with your health care provider. Document Revised: 12/15/2020 Document Reviewed: 12/15/2020 Elsevier Patient Education  [place].

## 2022-02-19 LAB — TSH: TSH: 6.56 mIU/L — ABNORMAL HIGH (ref 0.40–4.50)

## 2022-02-24 ENCOUNTER — Telehealth: Payer: Self-pay | Admitting: Nurse Practitioner

## 2022-02-24 ENCOUNTER — Other Ambulatory Visit: Payer: Self-pay | Admitting: Nurse Practitioner

## 2022-02-24 DIAGNOSIS — F419 Anxiety disorder, unspecified: Secondary | ICD-10-CM

## 2022-02-24 MED ORDER — ALPRAZOLAM 1 MG PO TABS: ORAL_TABLET | ORAL | 0 refills | Status: DC

## 2022-02-24 NOTE — Telephone Encounter (Signed)
Pt is requesting a refill on Xanax to [facility] on [address]

## 2022-02-24 NOTE — Progress Notes (Signed)
PDMP is reviewed and appropriate  

## 2022-02-28 ENCOUNTER — Encounter: Payer: Self-pay | Admitting: Internal Medicine

## 2022-02-28 ENCOUNTER — Ambulatory Visit (INDEPENDENT_AMBULATORY_CARE_PROVIDER_SITE_OTHER): Payer: Medicare Other | Admitting: Internal Medicine

## 2022-02-28 VITALS — BP 180/120 | HR 93 | Temp 97.9°F | Resp 19 | Ht 62.0 in | Wt 155.2 lb

## 2022-02-28 DIAGNOSIS — Z79899 Other long term (current) drug therapy: Secondary | ICD-10-CM | POA: Diagnosis not present

## 2022-02-28 DIAGNOSIS — I10 Essential (primary) hypertension: Secondary | ICD-10-CM

## 2022-02-28 DIAGNOSIS — R300 Dysuria: Secondary | ICD-10-CM

## 2022-02-28 MED ORDER — OLMESARTAN MEDOXOMIL 40 MG PO TABS: ORAL_TABLET | ORAL | 3 refills | Status: DC

## 2022-02-28 MED ORDER — BISOPROLOL FUMARATE 10 MG PO TABS: ORAL_TABLET | ORAL | 1 refills | Status: DC

## 2022-02-28 NOTE — Progress Notes (Signed)
Future Appointments  Date Time Provider Department  03/04/2022 11:30 AM Raynelle Dick, NP [facility]  03/07/2022  3:45 PM Raynelle Dick, NP [facility]  03/24/2022  3:00 PM Raynelle Dick, NP [facility]  10/18/2022 11:00 AM Raynelle Dick, NP [facility]    History of Present Illness:     Patient is a very nice 72 yo WF recently started on Losartan of labile HTN peaking in the 190s/90's range. She presented today  very anxious with some vague upper chest & neck discomfort denying any N/V, dyspnea or diaphoresis. She's also c/o dysuria & urinary frequency.    Medications  Current Outpatient Medications (Endocrine & Metabolic):    levothyroxine (SYNTHROID) 75 MCG tablet, Take  1 tablet  Daily  on an empty stomach with only water for 30 minutes & no Antacid meds, Calcium or Magnesium for 4 hours & avoid Biotin  Current Outpatient Medications (Cardiovascular):    bisoprolol (ZEBETA) 10 MG tablet, Take 1 tablet every Morning  for BP   losartan (COZAAR) 50 MG tablet, Take 1 tablet (50 mg total) by mouth daily.   olmesartan (BENICAR) 40 MG tablet, Take 1 tablet every Night  for BP  Current Outpatient Medications (Respiratory):    cetirizine (ZYRTEC) 10 MG tablet, 1 tablet  Current Outpatient Medications (Analgesics):    aspirin 81 MG chewable tablet, 3 days a week   meloxicam (MOBIC) 15 MG tablet, TAKE 1/2 TO 1 TABLET BY MOUTH DAILY WITH FOOD FOR PAIN AND INFLAMMATION  Current Outpatient Medications (Hematological):    Cyanocobalamin (B-12) 1000 MCG TABS, Take by mouth.  Current Outpatient Medications (Other):    ALPRAZolam (XANAX) 1 MG tablet, TAKE 1/2 TO 1 TABLET BY MOUTH EVERY NIGHT AT BEDTIME IF NEEDED FOR SLEEP AND LIMIT TO 5 DAYS PER WEEK TO AVOID ADDICTION AND DEMENTIA   amphetamine-dextroamphetamine (ADDERALL) 30 MG tablet, Take 1 tablet by mouth daily.   busPIRone (BUSPAR) 7.5 MG tablet, Take 1 tablet (7.5 mg total) by mouth 3 (three) times daily.   omeprazole  (PRILOSEC) 40 MG capsule, TAKE ONE CAPSULE BY MOUTH DAILY TO PREVENT HEARTBURN AND INDIGESTION   vitamin C (ASCORBIC ACID) 500 MG tablet, Take 1,200 mg by mouth daily.   Vitamin D, Ergocalciferol, (DRISDOL) 1.25 MG (50000 UNIT) CAPS capsule, TAKE 1 CAPSULE BY MOUTH 3 TIMES A WEEK ON MONDAY- WEDNESDAY- FRIDAY FOR SEVERE VITAMIN D. DEFICIENCY  Problem list She has Hypothyroidism; Iron deficiency anemia; Obstructive sleep apnea; Allergic rhinitis; Headache; PULMONARY EMBOLISM, HX OF; Hyperlipidemia; Hypertension; Anxiety; GERD (gastroesophageal reflux disease); ADD (attention deficit disorder); Depression; Vitamin D deficiency; Abnormal glucose; Vaginal atrophy; Medication management; B12 deficiency; Diaphragmatic hernia; Diverticular disease of colon; Dysphagia; Generalized abdominal pain; Hematochezia; Left lower quadrant pain; Personal history of colonic polyps; and Urticaria on their problem list.   Observations/Objective:  BP (!) 180/120   Pulse 93   Temp 97.9 F (36.6 C)   Resp 19   Ht 5\' 2"  (1.575 m)   Wt 155 lb 3.2 oz (70.4 kg)   SpO2 98%   BMI 28.39 kg/m   BP rechecked at 179/129  HEENT - WNL. Neck - supple.  Chest - Clear equal BS. Cor - Nl HS. RRR w/o sig MGR. PP 1(+). No edema. MS- FROM w/o deformities.  Gait Nl. Neuro -  Nl w/o focal abnormalities.   EKG shows mild sinus tach approx 110 / min. No acute changes.  Assessment and Plan:   1. Accelerated hypertension  - Recc ROV 4-5  days to recheck  - CBC with Differential/Platelet - COMPLETE METABOLIC PANEL WITH GFR - Magnesium - TSH - EKG 12-Lead   - olmesartan (BENICAR) 40 MG tablet;  Take 1 tablet every Night  for BP   Dispense: 90 tablet; Refill: 3   - bisoprolol (ZEBETA) 10 MG tablet;  Take 1 tablet every Morning  for BP   Dispense: 90 tablet; Refill: 1                  ( To take 1st dose tonight)    2. Dysuria  - Urinalysis, Routine w reflex microscopic - Urine Culture  3. Medication  management  - CBC with Differential/Platelet - COMPLETE METABOLIC PANEL WITH GFR - Magnesium - TSH - EKG 12-Lead   Follow Up Instructions:        I discussed the assessment and treatment plan with the patient. The patient was provided an opportunity to ask questions and all were answered. The patient agreed with the plan and demonstrated an understanding of the instructions.       The patient was advised to call back or seek an in-person evaluation if the symptoms worsen or if the condition fails to improve as anticipated.    Kirtland Bouchard, MD

## 2022-02-28 NOTE — Patient Instructions (Signed)
Hypertension, Adult High blood pressure (hypertension) is when the force of blood pumping through the arteries is too strong. The arteries are the blood vessels that carry blood from the heart throughout the body. Hypertension forces the heart to work harder to pump blood and may cause arteries to become narrow or stiff. Untreated or uncontrolled hypertension can lead to a heart attack, heart failure, a stroke, kidney disease, and other problems. A blood pressure reading consists of a higher number over a lower number. Ideally, your blood pressure should be below 120/80. The first ("top") number is called the systolic pressure. It is a measure of the pressure in your arteries as your heart beats. The second ("bottom") number is called the diastolic pressure. It is a measure of the pressure in your arteries as the heart relaxes. What are the causes? The exact cause of this condition is not known. There are some conditions that result in high blood pressure. What increases the risk? Certain factors may make you more likely to develop high blood pressure. Some of these risk factors are under your control, including: Smoking. Not getting enough exercise or physical activity. Being overweight. Having too much fat, sugar, calories, or salt (sodium) in your diet. Drinking too much alcohol. Other risk factors include: Having a personal history of heart disease, diabetes, high cholesterol, or kidney disease. Stress. Having a family history of high blood pressure and high cholesterol. Having obstructive sleep apnea. Age. The risk increases with age. What are the signs or symptoms? High blood pressure may not cause symptoms. Very high blood pressure (hypertensive crisis) may cause: Headache. Fast or irregular heartbeats (palpitations). Shortness of breath. Nosebleed. Nausea and vomiting. Vision changes. Severe chest pain, dizziness, and seizures. How is this diagnosed? This condition is diagnosed by  measuring your blood pressure while you are seated, with your arm resting on a flat surface, your legs uncrossed, and your feet flat on the floor. The cuff of the blood pressure monitor will be placed directly against the skin of your upper arm at the level of your heart. Blood pressure should be measured at least twice using the same arm. Certain conditions can cause a difference in blood pressure between your right and left arms. If you have a high blood pressure reading during one visit or you have normal blood pressure with other risk factors, you may be asked to: Return on a different day to have your blood pressure checked again. Monitor your blood pressure at home for 1 week or longer. If you are diagnosed with hypertension, you may have other blood or imaging tests to help your health care provider understand your overall risk for other conditions. How is this treated? This condition is treated by making healthy lifestyle changes, such as eating healthy foods, exercising more, and reducing your alcohol intake. You may be referred for counseling on a healthy diet and physical activity. Your health care provider may prescribe medicine if lifestyle changes are not enough to get your blood pressure under control and if: Your systolic blood pressure is above 130. Your diastolic blood pressure is above 80. Your personal target blood pressure may vary depending on your medical conditions, your age, and other factors. Follow these instructions at home: Eating and drinking  Eat a diet that is high in fiber and potassium, and low in sodium, added sugar, and fat. An example of this eating plan is called the DASH diet. DASH stands for Dietary Approaches to Stop Hypertension. To eat this way: Eat   plenty of fresh fruits and vegetables. Try to fill one half of your plate at each meal with fruits and vegetables. Eat whole grains, such as whole-wheat pasta, brown rice, or whole-grain bread. Fill about one  fourth of your plate with whole grains. Eat or drink low-fat dairy products, such as skim milk or low-fat yogurt. Avoid fatty cuts of meat, processed or cured meats, and poultry with skin. Fill about one fourth of your plate with lean proteins, such as fish, chicken without skin, beans, eggs, or tofu. Avoid pre-made and processed foods. These tend to be higher in sodium, added sugar, and fat. Reduce your daily sodium intake. Many people with hypertension should eat less than 1,500 mg of sodium a day. Do not drink alcohol if: Your health care provider tells you not to drink. You are pregnant, may be pregnant, or are planning to become pregnant. If you drink alcohol: Limit how much you have to: 0-1 drink a day for women. 0-2 drinks a day for men. Know how much alcohol is in your drink. In the [place] one drink equals one 12 oz bottle of beer (355 mL), one 5 oz glass of wine (148 mL), or one 1 oz glass of hard liquor (44 mL). Lifestyle  Work with your health care provider to maintain a healthy body weight or to lose weight. Ask what an ideal weight is for you. Get at least 30 minutes of exercise that causes your heart to beat faster (aerobic exercise) most days of the week. Activities may include walking, swimming, or biking. Include exercise to strengthen your muscles (resistance exercise), such as Pilates or lifting weights, as part of your weekly exercise routine. Try to do these types of exercises for 30 minutes at least 3 days a week. Do not use any products that contain nicotine or tobacco. These products include cigarettes, chewing tobacco, and vaping devices, such as e-cigarettes. If you need help quitting, ask your health care provider. Monitor your blood pressure at home as told by your health care provider. Keep all follow-up visits. This is important. Medicines Take over-the-counter and prescription medicines only as told by your health care provider. Follow directions carefully. Blood  pressure medicines must be taken as prescribed. Do not skip doses of blood pressure medicine. Doing this puts you at risk for problems and can make the medicine less effective. Ask your health care provider about side effects or reactions to medicines that you should watch for. Contact a health care provider if you: Think you are having a reaction to a medicine you are taking. Have headaches that keep coming back (recurring). Feel dizzy. Have swelling in your ankles. Have trouble with your vision. Get help right away if you: Develop a severe headache or confusion. Have unusual weakness or numbness. Feel faint. Have severe pain in your chest or abdomen. Vomit repeatedly. Have trouble breathing. These symptoms may be an emergency. Get help right away. Call 911. Do not wait to see if the symptoms will go away. Do not drive yourself to the hospital. Summary Hypertension is when the force of blood pumping through your arteries is too strong. If this condition is not controlled, it may put you at risk for serious complications. Your personal target blood pressure may vary depending on your medical conditions, your age, and other factors. For most people, a normal blood pressure is less than 120/80. Hypertension is treated with lifestyle changes, medicines, or a combination of both. Lifestyle changes include losing weight, eating a healthy,   low-sodium diet, exercising more, and limiting alcohol. This information is not intended to replace advice given to you by your health care provider. Make sure you discuss any questions you have with your health care provider. Document Revised: 12/15/2020 Document Reviewed: 12/15/2020 Elsevier Patient Education  2023 [employer]  

## 2022-03-01 ENCOUNTER — Encounter: Payer: Self-pay | Admitting: Internal Medicine

## 2022-03-01 ENCOUNTER — Encounter: Payer: Self-pay | Admitting: Nurse Practitioner

## 2022-03-01 NOTE — Progress Notes (Signed)
<><><><><><><><><><><><><><><><><><><><><><><><><><><><><><><><><> <><><><><><><><><><><><><><><><><><><><><><><><><><><><><><><><><> -   Test results slightly outside the reference range are not unusual. If there is anything important, I will review this with you,  otherwise it is considered normal test values.  If you have further questions,  please do not hesitate to contact me at the office or via My Chart.  <><><><><><><><><><><><><><><><><><><><><><><><><><><><><><><><><> <><><><><><><><><><><><><><><><><><><><><><><><><><><><><><><><><>  -  U/A looks - OK - Culture pending -  <><><><><><><><><><><><><><><><><><><><><><><><><><><><><><><><><> <><><><><><><><><><><><><><><><><><><><><><><><><><><><><><><><><>  -  TSH slightly elevated  suggesting Thyroid hormone borderline low in blood   -  It's VERY IMPORTANT  to take                on an empty stomach with only water for 30 minutes &                                      No Antacid meds, Calcium or Magnesium for 4 hours &                                                                                                                       Avoid Biotin  - Will continue to monitor closely <><><><><><><><><><><><><><><><><><><><><><><><><><><><><><><><><> <><><><><><><><><><><><><><><><><><><><><><><><><><><><><><><><><>  -  All Else -Kidneys - Electrolytes - Liver - &  Magnesium   - all  Normal / OK <><><><><><><><><><><><><><><><><><><><><><><><><><><><><><><><><> <><><><><><><><><><><><><><><><><><><><><><><><><><><><><><><><><>

## 2022-03-02 ENCOUNTER — Other Ambulatory Visit: Payer: Self-pay | Admitting: Internal Medicine

## 2022-03-02 DIAGNOSIS — N3000 Acute cystitis without hematuria: Secondary | ICD-10-CM

## 2022-03-02 DIAGNOSIS — F332 Major depressive disorder, recurrent severe without psychotic features: Secondary | ICD-10-CM | POA: Diagnosis not present

## 2022-03-02 LAB — CBC WITH DIFFERENTIAL/PLATELET
Absolute Monocytes: 647 cells/uL (ref 200–950)
Basophils Absolute: 42 cells/uL (ref 0–200)
Basophils Relative: 0.4 %
Eosinophils Absolute: 159 cells/uL (ref 15–500)
Eosinophils Relative: 1.5 %
HCT: 44.5 % (ref 35.0–45.0)
Hemoglobin: 15.3 g/dL (ref 11.7–15.5)
Lymphs Abs: 2671 cells/uL (ref 850–3900)
MCH: 30.7 pg (ref 27.0–33.0)
MCHC: 34.4 g/dL (ref 32.0–36.0)
MCV: 89.4 fL (ref 80.0–100.0)
MPV: 10 fL (ref 7.5–12.5)
Monocytes Relative: 6.1 %
Neutro Abs: 7081 cells/uL (ref 1500–7800)
Neutrophils Relative %: 66.8 %
Platelets: 398 10*3/uL (ref 140–400)
RBC: 4.98 10*6/uL (ref 3.80–5.10)
RDW: 12.6 % (ref 11.0–15.0)
Total Lymphocyte: 25.2 %
WBC: 10.6 10*3/uL (ref 3.8–10.8)

## 2022-03-02 LAB — COMPLETE METABOLIC PANEL WITH GFR
AG Ratio: 1.9 (calc) (ref 1.0–2.5)
ALT: 25 U/L (ref 6–29)
AST: 22 U/L (ref 10–35)
Albumin: 4.8 g/dL (ref 3.6–5.1)
Alkaline phosphatase (APISO): 86 U/L (ref 37–153)
BUN: 11 mg/dL (ref 7–25)
CO2: 27 mmol/L (ref 20–32)
Calcium: 10.6 mg/dL — ABNORMAL HIGH (ref 8.6–10.4)
Chloride: 104 mmol/L (ref 98–110)
Creat: 0.96 mg/dL (ref 0.50–1.05)
Globulin: 2.5 g/dL (calc) (ref 1.9–3.7)
Glucose, Bld: 92 mg/dL (ref 65–99)
Potassium: 4.1 mmol/L (ref 3.5–5.3)
Sodium: 142 mmol/L (ref 135–146)
Total Bilirubin: 0.8 mg/dL (ref 0.2–1.2)
Total Protein: 7.3 g/dL (ref 6.1–8.1)
eGFR: 64 mL/min/{1.73_m2} (ref >=60)

## 2022-03-02 LAB — URINALYSIS, ROUTINE W REFLEX MICROSCOPIC
Bilirubin Urine: NEGATIVE
Glucose, UA: NEGATIVE
Hgb urine dipstick: NEGATIVE
Ketones, ur: NEGATIVE
Leukocytes,Ua: NEGATIVE
Nitrite: NEGATIVE
Protein, ur: NEGATIVE
Specific Gravity, Urine: 1.002 (ref 1.001–1.035)
pH: 6 (ref 5.0–8.0)

## 2022-03-02 LAB — MAGNESIUM: Magnesium: 2.2 mg/dL (ref 1.5–2.5)

## 2022-03-02 LAB — URINE CULTURE
MICRO NUMBER:: 14402797
SPECIMEN QUALITY:: ADEQUATE

## 2022-03-02 LAB — TSH: TSH: 5.6 mIU/L — ABNORMAL HIGH (ref 0.40–4.50)

## 2022-03-02 MED ORDER — SULFAMETHOXAZOLE-TRIMETHOPRIM 800-160 MG PO TABS: ORAL_TABLET | ORAL | 0 refills | Status: DC

## 2022-03-02 NOTE — Progress Notes (Signed)
<><><><><><><><><><><><><><><><><><><><><><><><><><><><><><><><><> <><><><><><><><><><><><><><><><><><><><><><><><><><><><><><><><><> -   Test results slightly outside the reference range are not unusual. If there is anything important, I will review this with you,  otherwise it is considered normal test values.  If you have further questions,  please do not hesitate to contact me at the office or via My Chart.  <><><><><><><><><><><><><><><><><><><><><><><><><><><><><><><><><> <><><><><><><><><><><><><><><><><><><><><><><><><><><><><><><><><>  -  Urine culture did finally return & does show an E.coli  infection.                          So sent in a Rx  for a sulfa drug to [facility] for 10 days   - Please schedule a Nurse Visit in 1 month to recheck your Urine to                                                                   assure the Infection is completely cleared up .   <><><><><><><><><><><><><><><><><><><><><><><><><><><><><><><><><> <><><><><><><><><><><><><><><><><><><><><><><><><><><><><><><><><>

## 2022-03-03 ENCOUNTER — Other Ambulatory Visit: Payer: Self-pay | Admitting: Nurse Practitioner

## 2022-03-03 DIAGNOSIS — R0989 Other specified symptoms and signs involving the circulatory and respiratory systems: Secondary | ICD-10-CM

## 2022-03-03 NOTE — Telephone Encounter (Signed)
Can you advise her on cardiologist

## 2022-03-03 NOTE — Progress Notes (Deleted)
Assessment and Plan:  There are no diagnoses linked to this encounter.    Further disposition pending results of labs. Discussed med's effects and SE's.   Over 30 minutes of exam, counseling, chart review, and critical decision making was performed.   Future Appointments  Date Time Provider [place]  03/04/2022 11:30 AM Alycia Rossetti, NP [facility] None  03/07/2022  3:45 PM Alycia Rossetti, NP [facility] None  03/24/2022  3:00 PM Alycia Rossetti, NP [facility] None  10/18/2022 11:00 AM Alycia Rossetti, NP [facility] None    ------------------------------------------------------------------------------------------------------------------   HPI There were no vitals taken for this visit. 72 y.o.female presents for  Past Medical History:  Diagnosis Date   ADD (attention deficit disorder)    Anxiety    Depression    GERD (gastroesophageal reflux disease)    Hyperlipidemia    Hypertension    Hypothyroid    PONV (postoperative nausea and vomiting)    Vitamin D deficiency      Allergies  Allergen Reactions   Levofloxacin     Neck sweling/difficult breathing   Cefprozil    Codeine     REACTION: vomiting   Hydrocodone-Acetaminophen    Morphine    Nitrofuran Derivatives Itching   Pravastatin Swelling   Lexapro [Escitalopram Oxalate] Other (See Comments)    Tapping/abnormal movements    Current Outpatient Medications on File Prior to Visit  Medication Sig   ALPRAZolam (XANAX) 1 MG tablet TAKE 1/2 TO 1 TABLET BY MOUTH EVERY NIGHT AT BEDTIME IF NEEDED FOR SLEEP AND LIMIT TO 5 DAYS PER WEEK TO AVOID ADDICTION AND DEMENTIA   amphetamine-dextroamphetamine (ADDERALL) 30 MG tablet Take 1 tablet by mouth daily.   aspirin 81 MG chewable tablet 3 days a week   bisoprolol (ZEBETA) 10 MG tablet Take 1 tablet every Morning  for BP   busPIRone (BUSPAR) 7.5 MG tablet Take 1 tablet (7.5 mg total) by mouth 3 (three) times daily.   cetirizine (ZYRTEC) 10 MG tablet 1 tablet    Cyanocobalamin (B-12) 1000 MCG TABS Take by mouth.   levothyroxine (SYNTHROID) 75 MCG tablet Take  1 tablet  Daily  on an empty stomach with only water for 30 minutes & no Antacid meds, Calcium or Magnesium for 4 hours & avoid Biotin   losartan (COZAAR) 50 MG tablet Take 1 tablet (50 mg total) by mouth daily.   meloxicam (MOBIC) 15 MG tablet TAKE 1/2 TO 1 TABLET BY MOUTH DAILY WITH FOOD FOR PAIN AND INFLAMMATION   olmesartan (BENICAR) 40 MG tablet Take 1 tablet every Night  for BP   omeprazole (PRILOSEC) 40 MG capsule TAKE ONE CAPSULE BY MOUTH DAILY TO PREVENT HEARTBURN AND INDIGESTION   sulfamethoxazole-trimethoprim (BACTRIM DS) 800-160 MG tablet Take 1 tablet 2 x /day with Meals for Infection   vitamin C (ASCORBIC ACID) 500 MG tablet Take 1,200 mg by mouth daily.   Vitamin D, Ergocalciferol, (DRISDOL) 1.25 MG (50000 UNIT) CAPS capsule TAKE 1 CAPSULE BY MOUTH 3 TIMES A WEEK ON MONDAY- WEDNESDAY- FRIDAY FOR SEVERE VITAMIN D. DEFICIENCY   No current facility-administered medications on file prior to visit.    ROS: all negative except above.   Physical Exam:  There were no vitals taken for this visit.  General Appearance: Well nourished, in no apparent distress. Eyes: PERRLA, EOMs, conjunctiva no swelling or erythema Sinuses: No Frontal/maxillary tenderness ENT/Mouth: Ext aud canals clear, TMs without erythema, bulging. No erythema, swelling, or exudate on post pharynx.  Tonsils not swollen or  erythematous. Hearing normal.  Neck: Supple, thyroid normal.  Respiratory: Respiratory effort normal, BS equal bilaterally without rales, rhonchi, wheezing or stridor.  Cardio: RRR with no MRGs. Brisk peripheral pulses without edema.  Abdomen: Soft, + BS.  Non tender, no guarding, rebound, hernias, masses. Lymphatics: Non tender without lymphadenopathy.  Musculoskeletal: Full ROM, 5/5 strength, normal gait.  Skin: Warm, dry without rashes, lesions, ecchymosis.  Neuro: Cranial nerves intact.  Normal muscle tone, no cerebellar symptoms. Sensation intact.  Psych: Awake and oriented X 3, normal affect, Insight and Judgment appropriate.     Alycia Rossetti, NP 1:10 PM [facility]

## 2022-03-04 ENCOUNTER — Ambulatory Visit (INDEPENDENT_AMBULATORY_CARE_PROVIDER_SITE_OTHER): Payer: Medicare Other | Admitting: Internal Medicine

## 2022-03-04 ENCOUNTER — Encounter: Payer: Self-pay | Admitting: Internal Medicine

## 2022-03-04 ENCOUNTER — Ambulatory Visit: Payer: PRIVATE HEALTH INSURANCE | Admitting: Nurse Practitioner

## 2022-03-04 VITALS — BP 158/74 | HR 87 | Temp 97.7°F | Resp 16 | Ht 62.0 in | Wt 155.6 lb

## 2022-03-04 DIAGNOSIS — R0989 Other specified symptoms and signs involving the circulatory and respiratory systems: Secondary | ICD-10-CM

## 2022-03-05 NOTE — Progress Notes (Signed)
Future Appointments  Date Time Provider Department  03/24/2022  3:00 PM Alycia Rossetti, NP [facility]  10/18/2022 11:00 AM Alycia Rossetti, NP [facility]    History of Present Illness:                                       Patient is a very nice 72 yo WF recently presenting  with elevated BPs in the 190s/90's range. She   She had previously been started on Losartan & was switched to Olmesartan  and added Bisoprolol at last visit on Jan 8th  for elevated BP 180/120. Apparently after starting 1/2 tab of  Olmesartan , she had a precipitous drop in BP to 97/41, so she with held further BP meds. She reports wide swings in BPs .  Today's initial BP was 158/74 and was rechecked at                152/93  p 78  sitting         &  then        161/95  P 90 standing.     Medications    levothyroxine  75 MCG tablet, Take  1 tablet  Daily  (  taking 1.5 tab 2 x ./wk on W Th and 1 tab other 5 days)     (Patient not taking: Reported on 03/04/2022)  (Patient not taking: Reported on 03/04/2022)    cetirizine (ZYRTEC) 10 MG tablet, 1 tablet   aspirin 81 MG chewable tablet, 3 days a week   meloxicam (MOBIC) 15 MG tablet, TAKE 1/2 TO 1 TABLET DAILY    Cyanocobalamin (B-12) 1000 MCG TABS, Take daily    ALPRAZolam (XANAX) 1 MG tablet, TAKE 1/2 TO 1 TABLET  AT BEDTIME IF NEEDED    busPIRone  7.5 MG tablet, Take 1 tablet 3 times daily.    ADDERALL 30 MG tablet, Take 1 tablet daily.    omeprazole 40 MG capsule, TAKE ONE CAPSULE DAILY   vitamin C  500 MG tablet, Take  daily.   Vitamin D  50,000 u cap , TAKE 1 CAPSULE 3 TIMES A WEEK ON M-W-F    Problem list She has Hypothyroidism; Iron deficiency anemia; Obstructive sleep apnea; Allergic rhinitis; Headache; PULMONARY EMBOLISM, HX OF; Hyperlipidemia; Hypertension; Anxiety; GERD (gastroesophageal reflux disease); ADD (attention deficit disorder); Depression; Vitamin D deficiency; Abnormal glucose; Vaginal atrophy; Medication management; B12 deficiency;  Diaphragmatic hernia; Diverticular disease of colon; Dysphagia; Generalized abdominal pain; Hematochezia; Left lower quadrant pain; Personal history of colonic polyps; and Urticaria on their problem list.   Observations/Objective:  BP (!) 158/74   Pulse 87   Temp 97.7 F (36.5 C)   Resp 16   Ht 5\' 2"  (1.575 m)   Wt 155 lb 9.6 oz (70.6 kg)   SpO2 95%   BMI 28.46 kg/m   HEENT - WNL. Neck - supple.  Chest - Clear equal BS. Cor - Nl HS. RRR w/o sig MGR. PP 1(+). No edema. MS- FROM w/o deformities.  Gait Nl. Neuro -  Nl w/o focal abnormalities.   Assessment and Plan:   1. Labile hypertension  Recommended monitor siting / standing BPs & P & call report in 4-5 days    Follow Up Instructions:        I discussed the assessment and treatment plan with the patient. The patient was provided an opportunity  to ask questions and all were answered. The patient agreed with the plan and demonstrated an understanding of the instructions. Over 20  minutes of counseling,  chart review and critical decision making was performed        The patient was advised to call back or seek an in-person evaluation if the symptoms worsen or if the condition fails to improve as anticipated.   Kirtland Bouchard, MD

## 2022-03-07 ENCOUNTER — Ambulatory Visit: Payer: PRIVATE HEALTH INSURANCE | Admitting: Nurse Practitioner

## 2022-03-10 DIAGNOSIS — G4733 Obstructive sleep apnea (adult) (pediatric): Secondary | ICD-10-CM | POA: Diagnosis not present

## 2022-03-14 ENCOUNTER — Telehealth: Payer: Self-pay | Admitting: Nurse Practitioner

## 2022-03-14 NOTE — Telephone Encounter (Signed)
Please verify her BP has been running in normal range before refilling Adderall as it is a stimulant medication that can increase BP

## 2022-03-14 NOTE — Telephone Encounter (Signed)
Patient is requesting a refill on Generic Adderall to [facility] [address]

## 2022-03-15 ENCOUNTER — Other Ambulatory Visit: Payer: Self-pay | Admitting: Nurse Practitioner

## 2022-03-15 DIAGNOSIS — F909 Attention-deficit hyperactivity disorder, unspecified type: Secondary | ICD-10-CM | POA: Diagnosis not present

## 2022-03-15 DIAGNOSIS — F988 Other specified behavioral and emotional disorders with onset usually occurring in childhood and adolescence: Secondary | ICD-10-CM

## 2022-03-15 DIAGNOSIS — F339 Major depressive disorder, recurrent, unspecified: Secondary | ICD-10-CM | POA: Diagnosis not present

## 2022-03-15 DIAGNOSIS — F411 Generalized anxiety disorder: Secondary | ICD-10-CM | POA: Diagnosis not present

## 2022-03-15 MED ORDER — AMPHETAMINE-DEXTROAMPHETAMINE 30 MG PO TABS: 30.0000 mg | ORAL_TABLET | Freq: Every day | ORAL | 0 refills | Status: DC

## 2022-03-15 NOTE — Telephone Encounter (Signed)
Sent patient a mychart message.

## 2022-03-15 NOTE — Telephone Encounter (Signed)
Please make sure to tell her she needs to monitor her BP daily if she continues to take Adderall

## 2022-03-16 NOTE — Progress Notes (Signed)
Cardiology Office Note:    Date:  03/17/2022  ID:  Diana Wolfe, DOB Jan 31, 1951, MRN [id number]  PCP:  Lucky Cowboy, MD  Cardiologist:  Parke Poisson, MD  Electrophysiologist:  None   Referring MD: Raynelle Dick, NP   Chief Complaint/Reason for Referral: Labile BP  History of Present Illness:    Diana Wolfe is a 72 y.o. female with a history of aortic atherosclerosis, hypertension, hyperlipidemia, hypothyroidism, GERD, ADD, here for the evaluation of labile blood pressure.  On 02/28/2022 she was seen by Dr. Oneta Rack. At that visit she had recently been started on losartan due to labile hypertension peaking in the 190s/90s. She complained of feeling anxious with upper chest and neck discomfort. She was started on olmesartan 40 mg at night, and bisoprolol 10 mg in the morning. The following day she reported improved blood pressures such as 131/52, but noted her heart rate was also down to 51 bpm. She was concerned that she wasn't able to function with heart rates this low. Later that day her blood pressure decreased to 91/40 and she asked for a referral to cardiology. She was advised to only take 1/2 tablet of olmesartan if her BP was greater than 130/80. Two days afterwards her BP was 170/70 with HR 60 bpm, so she took 1/2 tablet olmesartan. After a few hours, her BP dropped to 97/41 with a HR of 70 bpm.  Today, she is mainly concerned about potential damage to her heart from her recent labile blood pressures. She believes her anxiety medication had a side effect of increasing blood pressures. About 2 months ago she had started buspirone and her blood pressures were increasing to 150s-160s systolic. During her initial major BP spike to 180s/120s she had experienced chest tightness and SOB. Of note, a UTI was diagnosed that same day. She finished her abx (per [facility] appears to be Bactrim) about a week ago. With low blood pressures and heart rates, she feels significantly fatigued.  She notes that she was unable to get herself up off of the couch.  At this time she has stopped all of her anxiety medications and antihypertensives. She has been off of her antihypertensives since 03/06/22. Her last dose of Buspirone was on 1/19. She presents a BP log; monitors her blood pressures daily. On  personal review, this shows a range of readings including 127/70, 125/66, 112/50, 162/72, 154/78. Yesterday and the day before, her BP was in the 150s systolic. She attributes this to having salted pretzels; she endorses a normal amount of salt consumption in her diet. This morning it was in the 130s/70s. In clinic today her BP is 154/72 but notes that she had difficulty finding the office building for her appointment. Of note, about 10 years ago she was hypertensive in the setting of the death of her mother. She was on losartan at the time. Otherwise her blood pressure was consistently 120-125/80 in the past. Lately her diastolic readings are rarely over 70 mmHg.  Additionally she has struggled with thyroid management in the past 9 months. Suddenly her 125 mcg dose of levothyroxine was too much. She went off of the medication for 1 month and restarted at the lowest dose. Currently she has titrated up to the 75 mcg dose, which she states is closer but not yet at goal.  On occasion, she will take Adderal to keep her awake, especially with driving long distances. She may take meloxicam as needed for her left leg pain. Her leg  pain is attributable to the cold weather affecting her LLE hardware (rod).  Generally she is constantly active. She denies any prior issues with chest pain or anginal symptoms. Had only experienced chest pain associated with her recent blood pressure spikes.   The patient denies palpitations, PND, orthopnea, or leg swelling. Denies cough, fever, chills. Denies nausea, vomiting. Denies syncope or presyncope. Denies dizziness or lightheadedness. Denies snoring.   Past Medical  History:  Diagnosis Date   ADD (attention deficit disorder)    Anxiety    Depression    GERD (gastroesophageal reflux disease)    Hyperlipidemia    Hypertension    Hypothyroid    PONV (postoperative nausea and vomiting)    Vitamin D deficiency     Past Surgical History:  Procedure Laterality Date   BREAST BIOPSY Left 07/09/2019   CESAREAN SECTION     ESOPHAGOGASTRODUODENOSCOPY N/A 09/02/2015   Procedure: ESOPHAGOGASTRODUODENOSCOPY (EGD);  Surgeon: Laurence Spates, MD;  Location: [facility] ENDOSCOPY;  Service: Endoscopy;  Laterality: N/A;   FOOT SURGERY Right    IM NAILING TIBIA Left 01/09/2001   Dr. Maia Breslow, [facility]   TONSILLECTOMY  2002    Current Medications: No outpatient medications have been marked as taking for the 03/17/22 encounter (Office Visit) with [name], MD.    Current Outpatient Medications on File Prior to Visit  Medication Sig Dispense Refill   ALPRAZolam (XANAX) 1 MG tablet TAKE 1/2 TO 1 TABLET BY MOUTH EVERY NIGHT AT BEDTIME IF NEEDED FOR SLEEP AND LIMIT TO 5 DAYS PER WEEK TO AVOID ADDICTION AND DEMENTIA 30 tablet 0   amphetamine-dextroamphetamine (ADDERALL) 30 MG tablet Take 1 tablet by mouth daily. 30 tablet 0   aspirin 81 MG chewable tablet 3 days a week     bisoprolol (ZEBETA) 10 MG tablet Take 1 tablet every Morning  for BP (Patient not taking: Reported on 03/04/2022) 90 tablet 1   busPIRone (BUSPAR) 7.5 MG tablet Take 1 tablet (7.5 mg total) by mouth 3 (three) times daily. (Patient not taking: Reported on 03/17/2022) 90 tablet 3   cetirizine (ZYRTEC) 10 MG tablet 1 tablet     Cyanocobalamin (B-12) 1000 MCG TABS Take by mouth.     levothyroxine (SYNTHROID) 75 MCG tablet Take  1 tablet  Daily  on an empty stomach with only water for 30 minutes & no Antacid meds, Calcium or Magnesium for 4 hours & avoid Biotin 90 tablet 1   losartan (COZAAR) 50 MG tablet Take 1 tablet (50 mg total) by mouth daily. (Patient not taking: Reported on 03/17/2022) 30 tablet  11   meloxicam (MOBIC) 15 MG tablet TAKE 1/2 TO 1 TABLET BY MOUTH DAILY WITH FOOD FOR PAIN AND INFLAMMATION 90 tablet 1   olmesartan (BENICAR) 40 MG tablet Take 1 tablet every Night  for BP (Patient not taking: Reported on 03/04/2022) 90 tablet 3   omeprazole (PRILOSEC) 40 MG capsule TAKE ONE CAPSULE BY MOUTH DAILY TO PREVENT HEARTBURN AND INDIGESTION 90 capsule 1   vitamin C (ASCORBIC ACID) 500 MG tablet Take 1,200 mg by mouth daily.     Vitamin D, Ergocalciferol, (DRISDOL) 1.25 MG (50000 UNIT) CAPS capsule TAKE 1 CAPSULE BY MOUTH 3 TIMES A WEEK ON MONDAY- WEDNESDAY- FRIDAY FOR SEVERE VITAMIN D. DEFICIENCY 36 capsule 3   No current facility-administered medications on file prior to visit.     Allergies:   Levofloxacin, Buspirone, Cefprozil, Codeine, Hydrocodone-acetaminophen, Morphine, Nitrofuran derivatives, Pravastatin, and Lexapro [escitalopram oxalate]   Social History  Tobacco Use   Smoking status: Never   Smokeless tobacco: Never  Substance Use Topics   Alcohol use: Yes    Comment: occasional   Drug use: No     Family History: The patient's family history includes Breast cancer in her cousin and mother; Cancer (age of onset: 28) in her mother; Heart disease in her father; Hypertension in her father.  ROS:   Please see the history of present illness.    (+) Fatigue/Malaise (+) Chest pain and SOB secondary to hypertension (+) LLE pain All other systems reviewed and are negative.  EKGs/Labs/Other Studies Reviewed:    The following studies were reviewed today:  10/27/2020  CT Abdomen/Pelvis: IMPRESSION: 1. No CT findings of the abdomen or pelvis to explain right lower quadrant abdominal pain. 2. Normal appendix.   Aortic Atherosclerosis (ICD10-I70.0).  EKG:  EKG is personally reviewed. 03/17/2022: Sinus rhythm. Bi-atrial enlargement.  Imaging studies that I have independently reviewed today: n/a  Recent Labs: 02/28/2022: ALT 25; BUN 11; Creat 0.96; Hemoglobin 15.3;  Magnesium 2.2; Platelets 398; Potassium 4.1; Sodium 142; TSH 5.60   Recent Lipid Panel    Component Value Date/Time   CHOL 181 10/15/2021 1155   TRIG 181 (H) 10/15/2021 1155   HDL 53 10/15/2021 1155   CHOLHDL 3.4 10/15/2021 1155   VLDL 39 (H) 06/23/2016 1044   LDLCALC 100 (H) 10/15/2021 1155   LDLDIRECT 138.2 04/25/2008 1514    Physical Exam:    VS:  BP (!) 154/72   Pulse 84   Ht 5\' 2"  (1.575 m)   Wt 155 lb (70.3 kg)   SpO2 96%   BMI 28.35 kg/m     Wt Readings from Last 5 Encounters:  03/17/22 155 lb (70.3 kg)  03/04/22 155 lb 9.6 oz (70.6 kg)  02/28/22 155 lb 3.2 oz (70.4 kg)  02/18/22 158 lb 3.2 oz (71.8 kg)  11/17/21 157 lb 9.6 oz (71.5 kg)    Constitutional: No acute distress Eyes: sclera non-icteric, normal conjunctiva and lids ENMT: normal dentition, moist mucous membranes Cardiovascular: regular rhythm, normal rate, no murmur. S1 and S2 normal. No jugular venous distention.  Respiratory: clear to auscultation bilaterally GI : normal bowel sounds, soft and nontender. No distention.   MSK: extremities warm, well perfused. No edema.  NEURO: grossly nonfocal exam, moves all extremities. PSYCH: alert and oriented x 3, normal mood and affect.   ASSESSMENT:    1. Chest pain of uncertain etiology   2. Hypertension, unspecified type   3. Obstructive sleep apnea   4. Gastroesophageal reflux disease without esophagitis   5. Hypothyroidism, unspecified type    PLAN:    Chest pain of uncertain etiology - Plan: EKG 12-Lead, MYOCARDIAL PERFUSION IMAGING, ECHOCARDIOGRAM COMPLETE, Cardiac Stress Test: Informed Consent Details: Physician/Practitioner Attestation; Transcribe to consent form and obtain patient signature  Hypertension, unspecified type - Plan: EKG 12-Lead, VAS 11/19/21 RENAL ARTERY DUPLEX, ECHOCARDIOGRAM COMPLETE  Obstructive sleep apnea  Gastroesophageal reflux disease without esophagitis  Hypothyroidism, unspecified type  At this point, challenging to  manage HTN, however she is relatively normotensive and has struggled greatly with medication therapy recently. OK to hold all BP meds at this time to evaluate off of therapy.   Chest pain requires workup, recommend lexiscan myoview. Would consider CCTA however I don't think she'll tolerate beta blockade for scan given recent labile BP and HR.   F/u after testing. May be an optimal patient for the advanced HTN clinic pending results of testing.  Cherlynn Kaiser, MD, [place]   Shared Decision Making/Informed Consent:   Shared Decision Making/Informed Consent The risks [chest pain, shortness of breath, cardiac arrhythmias, dizziness, blood pressure fluctuations, myocardial infarction, stroke/transient ischemic attack, nausea, vomiting, allergic reaction, radiation exposure, metallic taste sensation and life-threatening complications (estimated to be 1 in 10,000)], benefits (risk stratification, diagnosing coronary artery disease, treatment guidance) and alternatives of a nuclear stress test were discussed in detail with Ms. Fulfer and she agrees to proceed.   Medication Adjustments/Labs and Tests Ordered: Current medicines are reviewed at length with the patient today.  Concerns regarding medicines are outlined above.   Orders Placed This Encounter  Procedures   Cardiac Stress Test: Informed Consent Details: Physician/Practitioner Attestation; Transcribe to consent form and obtain patient signature   MYOCARDIAL PERFUSION IMAGING   EKG 12-Lead   ECHOCARDIOGRAM COMPLETE   VAS US RENAL ARTERY DUPLEX   No orders of the defined types were placed in this encounter.  Patient Instructions  Medication Instructions:  No Changes In Medications at this time.  *If you need a refill on your cardiac medications before your next appointment, please call your pharmacy*  Lab Work: None Ordered At This Time.  If you have labs (blood work) drawn today and your tests are  completely normal, you will receive your results only by: [place] (if you have MyChart) OR A paper copy in the mail If you have any lab test that is abnormal or we need to change your treatment, we will call you to review the results.  Testing/Procedures: Your physician has requested that you have a lexiscan myoview. For further information please visit [email or web address]. Please follow instruction sheet, as given.  Your physician has requested that you have a renal artery duplex. During this test, an ultrasound is used to evaluate blood flow to the kidneys. Take your medications as you usually do. This will take place at [place], [address].  No food after 11PM the night before.  Water is OK. (Don't drink liquids if you have been instructed not to for ANOTHER test). Avoid foods that produce bowel gas, for 24 hours prior to exam (see below). No breakfast, no chewing gum, no smoking or carbonated beverages. Patient may take morning medications with water. Come in for test at least 15 minutes early to register.'   Your physician has requested that you have an echocardiogram. Echocardiography is a painless test that uses sound waves to create images of your heart. It provides your doctor with information about the size and shape of your heart and how well your heart's chambers and valves are working. You may receive an ultrasound enhancing agent through an IV if needed to better visualize your heart during the echo.This procedure takes approximately one hour. There are no restrictions for this procedure. This will take place at the [address], [address].   Follow-Up: At [facility], you and your health needs are our priority.  As part of our continuing mission to provide you with exceptional heart care, we have created designated Provider Care Teams.  These Care Teams include your primary Cardiologist (physician) and Advanced Practice Providers (APPs -  Physician  Assistants and Nurse Practitioners) who all work together to provide you with the care you need, when you need it.  Your next appointment:   4 week(s)  Provider:   With, [facility] Boyden, or [place]   I,Mathew Stumpf,acting as a scribe for [name], MD.,have documented  all relevant documentation on the behalf of Parke Poisson, MD,as directed by  Parke Poisson, MD while in the presence of Parke Poisson, MD.  I, Parke Poisson, MD, have reviewed all documentation for the visit on 03/17/2022. The documentation on today's date of service for the exam, diagnosis, procedures, and orders are all accurate and complete.

## 2022-03-17 ENCOUNTER — Encounter: Payer: Self-pay | Admitting: Internal Medicine

## 2022-03-17 ENCOUNTER — Ambulatory Visit: Payer: Medicare Other | Attending: Internal Medicine | Admitting: Internal Medicine

## 2022-03-17 VITALS — BP 154/72 | HR 84 | Ht 62.0 in | Wt 155.0 lb

## 2022-03-17 DIAGNOSIS — G4733 Obstructive sleep apnea (adult) (pediatric): Secondary | ICD-10-CM | POA: Diagnosis not present

## 2022-03-17 DIAGNOSIS — K219 Gastro-esophageal reflux disease without esophagitis: Secondary | ICD-10-CM | POA: Diagnosis not present

## 2022-03-17 DIAGNOSIS — E039 Hypothyroidism, unspecified: Secondary | ICD-10-CM

## 2022-03-17 DIAGNOSIS — I10 Essential (primary) hypertension: Secondary | ICD-10-CM

## 2022-03-17 DIAGNOSIS — R079 Chest pain, unspecified: Secondary | ICD-10-CM | POA: Diagnosis not present

## 2022-03-17 NOTE — Patient Instructions (Signed)
Medication Instructions:  No Changes In Medications at this time.  *If you need a refill on your cardiac medications before your next appointment, please call your pharmacy*  Lab Work: None Ordered At This Time.  If you have labs (blood work) drawn today and your tests are completely normal, you will receive your results only by: [place] (if you have MyChart) OR A paper copy in the mail If you have any lab test that is abnormal or we need to change your treatment, we will call you to review the results.  Testing/Procedures: Your physician has requested that you have a lexiscan myoview. For further information please visit [email or web address]. Please follow instruction sheet, as given.  Your physician has requested that you have a renal artery duplex. During this test, an ultrasound is used to evaluate blood flow to the kidneys. Take your medications as you usually do. This will take place at [place], [address].  No food after 11PM the night before.  Water is OK. (Don't drink liquids if you have been instructed not to for ANOTHER test). Avoid foods that produce bowel gas, for 24 hours prior to exam (see below). No breakfast, no chewing gum, no smoking or carbonated beverages. Patient may take morning medications with water. Come in for test at least 15 minutes early to register.'   Your physician has requested that you have an echocardiogram. Echocardiography is a painless test that uses sound waves to create images of your heart. It provides your doctor with information about the size and shape of your heart and how well your heart's chambers and valves are working. You may receive an ultrasound enhancing agent through an IV if needed to better visualize your heart during the echo.This procedure takes approximately one hour. There are no restrictions for this procedure. This will take place at the [address], [address].   Follow-Up: At [facility], you  and your health needs are our priority.  As part of our continuing mission to provide you with exceptional heart care, we have created designated Provider Care Teams.  These Care Teams include your primary Cardiologist (physician) and Advanced Practice Providers (APPs -  Physician Assistants and Nurse Practitioners) who all work together to provide you with the care you need, when you need it.  Your next appointment:   4 week(s)  Provider:   With, Haze Boyden, or [place]

## 2022-03-22 ENCOUNTER — Encounter (HOSPITAL_COMMUNITY): Payer: Self-pay | Admitting: Internal Medicine

## 2022-03-23 ENCOUNTER — Telehealth (HOSPITAL_COMMUNITY): Payer: Self-pay | Admitting: *Deleted

## 2022-03-23 ENCOUNTER — Encounter (HOSPITAL_COMMUNITY): Payer: Self-pay

## 2022-03-23 NOTE — Telephone Encounter (Signed)
Pt reached and given instructions for MPI scheduled on 03/25/22.

## 2022-03-24 ENCOUNTER — Encounter: Payer: PRIVATE HEALTH INSURANCE | Admitting: Nurse Practitioner

## 2022-03-25 ENCOUNTER — Ambulatory Visit (HOSPITAL_COMMUNITY): Payer: Medicare Other | Attending: Internal Medicine

## 2022-03-25 DIAGNOSIS — R079 Chest pain, unspecified: Secondary | ICD-10-CM | POA: Diagnosis not present

## 2022-03-25 LAB — MYOCARDIAL PERFUSION IMAGING
LV dias vol: 50 mL (ref 46–106)
LV sys vol: 16 mL
Nuc Stress EF: 69 %
Peak HR: 88 {beats}/min
Rest HR: 59 {beats}/min
Rest Nuclear Isotope Dose: 10.9 mCi
SDS: 0
SRS: 0
SSS: 0
ST Depression (mm): 0 mm
Stress Nuclear Isotope Dose: 31.7 mCi
TID: 1.1

## 2022-03-25 MED ORDER — TECHNETIUM TC 99M TETROFOSMIN IV KIT
31.7000 | PACK | Freq: Once | INTRAVENOUS | Status: AC | PRN
  Administered 2022-03-25: 31.7 via INTRAVENOUS

## 2022-03-25 MED ORDER — REGADENOSON 0.4 MG/5ML IV SOLN
0.4000 mg | Freq: Once | INTRAVENOUS | Status: AC
  Administered 2022-03-25: 0.4 mg via INTRAVENOUS

## 2022-03-25 MED ORDER — TECHNETIUM TC 99M TETROFOSMIN IV KIT
10.9000 | PACK | Freq: Once | INTRAVENOUS | Status: AC | PRN
  Administered 2022-03-25: 10.9 via INTRAVENOUS

## 2022-03-28 ENCOUNTER — Ambulatory Visit (INDEPENDENT_AMBULATORY_CARE_PROVIDER_SITE_OTHER): Payer: Medicare Other

## 2022-03-28 VITALS — BP 136/80 | HR 98 | Temp 97.9°F | Resp 17 | Ht 62.0 in | Wt 155.2 lb

## 2022-03-28 DIAGNOSIS — E039 Hypothyroidism, unspecified: Secondary | ICD-10-CM | POA: Diagnosis not present

## 2022-03-28 DIAGNOSIS — Z79899 Other long term (current) drug therapy: Secondary | ICD-10-CM | POA: Diagnosis not present

## 2022-03-28 DIAGNOSIS — R7989 Other specified abnormal findings of blood chemistry: Secondary | ICD-10-CM

## 2022-03-28 NOTE — Progress Notes (Signed)
The patient came in for repeat UA/Culture and also a repeat TSH. She reports that she is taking her thyroid medication 1 1/2 tablet three times a week. She would like advisement on any change in medication for her thyroid before filling her next prescription.

## 2022-03-29 ENCOUNTER — Other Ambulatory Visit: Payer: Self-pay | Admitting: Nurse Practitioner

## 2022-03-29 ENCOUNTER — Encounter: Payer: Self-pay | Admitting: Nurse Practitioner

## 2022-03-29 DIAGNOSIS — E039 Hypothyroidism, unspecified: Secondary | ICD-10-CM

## 2022-03-29 LAB — URINALYSIS, ROUTINE W REFLEX MICROSCOPIC
Bacteria, UA: NONE SEEN /HPF
Bilirubin Urine: NEGATIVE
Glucose, UA: NEGATIVE
Hgb urine dipstick: NEGATIVE
Hyaline Cast: NONE SEEN /LPF
Ketones, ur: NEGATIVE
Nitrite: NEGATIVE
Protein, ur: NEGATIVE
RBC / HPF: NONE SEEN /HPF (ref 0–2)
Specific Gravity, Urine: 1.009 (ref 1.001–1.035)
Squamous Epithelial / HPF: NONE SEEN /HPF (ref <=5)
WBC, UA: NONE SEEN /HPF (ref 0–5)
pH: 7 (ref 5.0–8.0)

## 2022-03-29 LAB — URINE CULTURE
MICRO NUMBER:: 14520464
SPECIMEN QUALITY:: ADEQUATE

## 2022-03-29 LAB — TSH: TSH: 0.7 mIU/L (ref 0.40–4.50)

## 2022-03-29 MED ORDER — LEVOTHYROXINE SODIUM 75 MCG PO TABS: ORAL_TABLET | ORAL | 1 refills | Status: DC

## 2022-03-29 NOTE — Progress Notes (Signed)
<><><><><><><><><><><><><><><><><><><><><><><><><><><><><><><><><> <><><><><><><><><><><><><><><><><><><><><><><><><><><><><><><><><>  -   Urine culture returned OK - Infection is cleared up  - Thyroid is back to Normal , So please keep dose same   <><><><><><><><><><><><><><><><><><><><><><><><><><><><><><><><><> <><><><><><><><><><><><><><><><><><><><><><><><><><><><><><><><><>

## 2022-03-31 ENCOUNTER — Ambulatory Visit (HOSPITAL_COMMUNITY)
Admission: RE | Admit: 2022-03-31 | Discharge: 2022-03-31 | Disposition: A | Discharge status: Home / Self Care | Payer: Medicare Other | Source: Ambulatory Visit | Attending: Cardiology | Admitting: Cardiology

## 2022-03-31 DIAGNOSIS — I10 Essential (primary) hypertension: Secondary | ICD-10-CM | POA: Insufficient documentation

## 2022-04-05 DIAGNOSIS — H35363 Drusen (degenerative) of macula, bilateral: Secondary | ICD-10-CM | POA: Diagnosis not present

## 2022-04-05 DIAGNOSIS — H43813 Vitreous degeneration, bilateral: Secondary | ICD-10-CM | POA: Diagnosis not present

## 2022-04-05 DIAGNOSIS — H25813 Combined forms of age-related cataract, bilateral: Secondary | ICD-10-CM | POA: Diagnosis not present

## 2022-04-11 ENCOUNTER — Ambulatory Visit (INDEPENDENT_AMBULATORY_CARE_PROVIDER_SITE_OTHER): Payer: Medicare Other

## 2022-04-11 DIAGNOSIS — R079 Chest pain, unspecified: Secondary | ICD-10-CM | POA: Diagnosis not present

## 2022-04-11 DIAGNOSIS — I10 Essential (primary) hypertension: Secondary | ICD-10-CM

## 2022-04-11 LAB — ECHOCARDIOGRAM COMPLETE
Area-P 1/2: 3.31 cm2
S' Lateral: 1.83 cm

## 2022-04-24 ENCOUNTER — Other Ambulatory Visit: Payer: Self-pay | Admitting: Nurse Practitioner

## 2022-04-24 DIAGNOSIS — F419 Anxiety disorder, unspecified: Secondary | ICD-10-CM

## 2022-04-25 NOTE — Progress Notes (Unsigned)
Cardiology Office Note:    Date:  04/25/2022   ID:  Diana Wolfe, DOB 11/22/1950, MRN [id number]  PCP:  Unk Pinto, [place] Providers Cardiologist:  [name], MD { Click to update primary MD,subspecialty MD or APP then REFRESH:1}    Referring MD: Unk Pinto, MD   No chief complaint on file. ***  History of Present Illness:    Diana Wolfe is a 72 y.o. female with a hx of hypertension, aortic atherosclerosis, OSA, GERD, hypothyroidism, history of pulmonary embolism, hyperlipidemia, anxiety, depression.  She was evaluated by her PCP on 02/28/2022 with complaints of hypertension, feeling anxious, upper neck and chest discomfort.  She was started on olmesartan, bisoprolol, and losartan but noted that her heart rate was down in the low 50s.  She then noted that her blood pressure had decreased to 91/40, she requested a referral to cardiology.  She establish care with our practice on 03/17/2022 with Dr. Margaretann Loveless after presenting to her PCP with the above presentation and labile hypertension.  Due to various concerns, she had stopped all of her antihypertensive medications and anxiety medications.  She presented a blood pressure log at this visit, readings were from [id number] systolic over [id number].  At this visit her blood pressure was 154/72.  She also had some concerns with chest pain, the decision was made for a Lexiscan Myoview, and to hold her current antihypertensive medications.  On 03/25/2022 she had a Lexi scan Myoview which was negative for ischemia.  On 03/31/2022 she had a renal artery ultrasound which showed mild bilateral renal artery stenosis, but this was not felt to be the cause of her blood pressure fluctuations.  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  On the 04/11/2022 she had a echo complete which showed an EF of 60 to 65%, mild concentric LVH, aortic valve sclerosis was present  without any stenosis.  Past Medical History:  Diagnosis Date   ADD (attention deficit disorder)    Anxiety    Depression    GERD (gastroesophageal reflux disease)    Hyperlipidemia    Hypertension    Hypothyroid    PONV (postoperative nausea and vomiting)    Vitamin D deficiency     Past Surgical History:  Procedure Laterality Date   BREAST BIOPSY Left 07/09/2019   CESAREAN SECTION     ESOPHAGOGASTRODUODENOSCOPY N/A 09/02/2015   Procedure: ESOPHAGOGASTRODUODENOSCOPY (EGD);  Surgeon: Laurence Spates, MD;  Location: [facility] ENDOSCOPY;  Service: Endoscopy;  Laterality: N/A;   FOOT SURGERY Right    IM NAILING TIBIA Left 01/09/2001   Dr. Maia Breslow, [facility]   TONSILLECTOMY  2002    Current Medications: No outpatient medications have been marked as taking for the 04/26/22 encounter (Appointment) with Almyra Deforest, [place].     Allergies:   Levofloxacin, Buspirone, Cefprozil, Codeine, Hydrocodone-acetaminophen, Morphine, Nitrofuran derivatives, Pravastatin, and Lexapro [escitalopram oxalate]   Social History   Socioeconomic History   Marital status: Divorced    Spouse name: Not on file   Number of children: Not on file   Years of education: Not on file   Highest education level: Not on file  Occupational History   Not on file  Tobacco Use   Smoking status: Never   Smokeless tobacco: Never  Substance and Sexual Activity   Alcohol use: Yes    Comment: occasional   Drug use: No   Sexual activity: Not on  file  Other Topics Concern   Not on file  Social History Narrative   Not on file   Social Determinants of Health   Financial Resource Strain: Low Risk  (11/11/2021)   Overall Financial Resource Strain (CARDIA)    Difficulty of Paying Living Expenses: Not hard at all  Food Insecurity: No Food Insecurity (11/11/2021)   Hunger Vital Sign    Worried About Running Out of Food in the Last Year: Never true    Ran Out of Food in the Last Year: Never true  Transportation Needs: No  Transportation Needs (11/11/2021)   PRAPARE - Hydrologist (Medical): No    Lack of Transportation (Non-Medical): No  Physical Activity: Not on file  Stress: Not on file  Social Connections: Moderately Isolated (11/11/2021)   Social Connection and Isolation Panel [NHANES]    Frequency of Communication with Friends and Family: Twice a week    Frequency of Social Gatherings with Friends and Family: Once a week    Attends Religious Services: Never    Marine scientist or Organizations: Yes    Attends Music therapist: More than 4 times per year    Marital Status: Divorced     Family History: The patient's ***family history includes Breast cancer in her cousin and mother; Cancer (age of onset: 28) in her mother; Heart disease in her father; Hypertension in her father.  ROS:   Please see the history of present illness.    *** All other systems reviewed and are negative.  EKGs/Labs/Other Studies Reviewed:    The following studies were reviewed today:  10/27/2020  CT Abdomen/Pelvis: IMPRESSION: 1. No CT findings of the abdomen or pelvis to explain right lower quadrant abdominal pain. 2. Normal appendix.   Aortic Atherosclerosis (ICD10-I70.0).  EKG:  EKG is *** ordered today.  The ekg ordered today demonstrates ***  Recent Labs: 02/28/2022: ALT 25; BUN 11; Creat 0.96; Hemoglobin 15.3; Magnesium 2.2; Platelets 398; Potassium 4.1; Sodium 142 03/28/2022: TSH 0.70  Recent Lipid Panel    Component Value Date/Time   CHOL 181 10/15/2021 1155   TRIG 181 (H) 10/15/2021 1155   HDL 53 10/15/2021 1155   CHOLHDL 3.4 10/15/2021 1155   VLDL 39 (H) 06/23/2016 1044   LDLCALC 100 (H) 10/15/2021 1155   LDLDIRECT 138.2 04/25/2008 1514     Risk Assessment/Calculations:   {Does this patient have ATRIAL FIBRILLATION?:[id number]}  No BP recorded.  {Refresh Note OR Click here to enter BP  :1}***         Physical Exam:    VS:  There were no vitals  taken for this visit.    Wt Readings from Last 3 Encounters:  03/28/22 155 lb 3.2 oz (70.4 kg)  03/25/22 155 lb (70.3 kg)  03/17/22 155 lb (70.3 kg)     GEN: *** Well nourished, well developed in no acute distress HEENT: Normal NECK: No JVD; No carotid bruits LYMPHATICS: No lymphadenopathy CARDIAC: ***RRR, no murmurs, rubs, gallops RESPIRATORY:  Clear to auscultation without rales, wheezing or rhonchi  ABDOMEN: Soft, non-tender, non-distended MUSCULOSKELETAL:  No edema; No deformity  SKIN: Warm and dry NEUROLOGIC:  Alert and oriented x 3 PSYCHIATRIC:  Normal affect   ASSESSMENT:    1. Hypertension, unspecified type   2. Chest pain of uncertain etiology   3. Hypothyroidism, unspecified type   4. 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 stress test, cath, DCCV, TEE, etc)?   Press F2        :[id number]    Medication Adjustments/Labs and Tests Ordered: Current medicines are reviewed at length with the patient today.  Concerns regarding medicines are outlined above.  No orders of the defined types were placed in this encounter.  No orders of the defined types were placed in this encounter.   There are no Patient Instructions on file for this visit.   Signed, Trudi Ida, NP  04/25/2022 7:42 PM    [place]

## 2022-04-26 ENCOUNTER — Ambulatory Visit: Payer: Medicare Other | Attending: Physician Assistant | Admitting: Cardiology

## 2022-04-26 ENCOUNTER — Encounter: Payer: Self-pay | Admitting: Cardiology

## 2022-04-26 VITALS — BP 168/96 | HR 71 | Ht 62.0 in | Wt 157.6 lb

## 2022-04-26 DIAGNOSIS — K551 Chronic vascular disorders of intestine: Secondary | ICD-10-CM | POA: Diagnosis not present

## 2022-04-26 DIAGNOSIS — E039 Hypothyroidism, unspecified: Secondary | ICD-10-CM

## 2022-04-26 DIAGNOSIS — I701 Atherosclerosis of renal artery: Secondary | ICD-10-CM

## 2022-04-26 DIAGNOSIS — I10 Essential (primary) hypertension: Secondary | ICD-10-CM

## 2022-04-26 DIAGNOSIS — R079 Chest pain, unspecified: Secondary | ICD-10-CM

## 2022-04-26 MED ORDER — AMLODIPINE BESYLATE 2.5 MG PO TABS: 2.5000 mg | ORAL_TABLET | Freq: Every day | ORAL | 3 refills | Status: DC

## 2022-04-26 NOTE — Patient Instructions (Signed)
Medication Instructions:   START Amlodipine 2.5 mg-TAKE 1 tablet by mouth daily   *If you need a refill on your cardiac medications before your next appointment, please call your pharmacy*  Lab Work: NONE ordered at this time of appointment   If you have labs (blood work) drawn today and your tests are completely normal, you will receive your results only by: [place] (if you have MyChart) OR A paper copy in the mail If you have any lab test that is abnormal or we need to change your treatment, we will call you to review the results.  Testing/Procedures: NONE ordered at this time of appointment   Follow-Up: At [facility], you and your health needs are our priority.  As part of our continuing mission to provide you with exceptional heart care, we have created designated Provider Care Teams.  These Care Teams include your primary Cardiologist (physician) and Advanced Practice Providers (APPs -  Physician Assistants and Nurse Practitioners) who all work together to provide you with the care you need, when you need it.  Your next appointment:   5 week(s)  Provider:   Coletta Memos, FNP        Other Instructions

## 2022-04-28 ENCOUNTER — Encounter: Payer: Self-pay | Admitting: Nurse Practitioner

## 2022-04-29 ENCOUNTER — Other Ambulatory Visit: Payer: Self-pay | Admitting: Nurse Practitioner

## 2022-04-29 DIAGNOSIS — F988 Other specified behavioral and emotional disorders with onset usually occurring in childhood and adolescence: Secondary | ICD-10-CM

## 2022-04-29 MED ORDER — AMPHETAMINE-DEXTROAMPHETAMINE 30 MG PO TABS: 30.0000 mg | ORAL_TABLET | Freq: Every day | ORAL | 0 refills | Status: DC

## 2022-04-29 NOTE — Progress Notes (Signed)
PDMP is reviewed and appropriate  

## 2022-05-10 ENCOUNTER — Encounter: Payer: Self-pay | Admitting: Internal Medicine

## 2022-05-11 ENCOUNTER — Encounter: Payer: Self-pay | Admitting: Nurse Practitioner

## 2022-05-13 ENCOUNTER — Ambulatory Visit: Payer: Medicare Other | Attending: Internal Medicine | Admitting: Internal Medicine

## 2022-05-13 ENCOUNTER — Encounter: Payer: Self-pay | Admitting: Internal Medicine

## 2022-05-13 VITALS — BP 150/72 | HR 68 | Ht 62.0 in | Wt 155.4 lb

## 2022-05-13 DIAGNOSIS — I10 Essential (primary) hypertension: Secondary | ICD-10-CM | POA: Diagnosis not present

## 2022-05-13 DIAGNOSIS — G4733 Obstructive sleep apnea (adult) (pediatric): Secondary | ICD-10-CM | POA: Insufficient documentation

## 2022-05-13 DIAGNOSIS — K551 Chronic vascular disorders of intestine: Secondary | ICD-10-CM | POA: Insufficient documentation

## 2022-05-13 DIAGNOSIS — R079 Chest pain, unspecified: Secondary | ICD-10-CM | POA: Diagnosis not present

## 2022-05-13 DIAGNOSIS — I701 Atherosclerosis of renal artery: Secondary | ICD-10-CM | POA: Insufficient documentation

## 2022-05-13 DIAGNOSIS — R230 Cyanosis: Secondary | ICD-10-CM | POA: Insufficient documentation

## 2022-05-13 NOTE — Progress Notes (Signed)
Cardiology Office Note:    Date:  05/13/2022  ID:  Diana Wolfe, DOB [birth year], MRN [id number]  PCP:  Unk Pinto, MD  Cardiologist:  [name], MD  Electrophysiologist:  None   Referring MD: Unk Pinto, MD   Chief Complaint/Reason for Referral: Labile BP  History of Present Illness:    Diana Wolfe is a 72 y.o. female with a history of aortic atherosclerosis, hypertension, hyperlipidemia, hypothyroidism, GERD, ADD, here for the evaluation of labile blood pressure.  On 02/28/2022 she was seen by Dr. Melford Aase. At that visit she had recently been started on losartan due to labile hypertension peaking in the 190s/90s. She complained of feeling anxious with upper chest and neck discomfort. She was started on olmesartan 40 mg at night, and bisoprolol 10 mg in the morning. The following day she reported improved blood pressures such as 131/52, but noted her heart rate was also down to 51 bpm. She was concerned that she wasn't able to function with heart rates this low. Later that day her blood pressure decreased to 91/40 and she asked for a referral to cardiology. She was advised to only take 1/2 tablet of olmesartan if her BP was greater than 130/80. Two days afterwards her BP was 170/70 with HR 60 bpm, so she took 1/2 tablet olmesartan. After a few hours, her BP dropped to 97/41 with a HR of 70 bpm.  She was last seen by me for consult on 03/17/22 for concern with labile blood pressures. She believed her anxiety medication had a side effect of increasing blood pressures. About 2 prior she had started buspirone and her blood pressures were increasing to 0000000 systolic. During her initial major BP spike to 180s/120s she had experienced chest tightness and SOB. Of note, a UTI was diagnosed that same day. She finished her abx (per [facility] appears to be Bactrim) about a week ago. With low blood pressures and heart rates, she reported feeling significantly fatigued. She noted  that she was unable to get herself up off of the couch.  At that time she has stopped all of her anxiety medications and antihypertensives. She has been off of her antihypertensives since 03/06/22. Her last dose of Buspirone was on 1/19. She presented a BP log; monitors her blood pressures daily. On  personal review, this showed a range of readings including 127/70, 125/66, 112/50, 162/72, 154/78. She was struggling with her thyroid management for the prior 9 months and had gone off of her Synthroid but had since resumed it and titrated up to the 32mcg dose by the time of our last visit.  She has since had an echo, renal vascular US, and NM stress testing in 03/2022. She followed up with NP Venia Carbon on 04/26/22 and discussed her results at that time. Anderson Malta started her on Amlodipine 2.5mg  daily.   Today:  Patient states that x1 week ago she started the Amlodipine after she noticed that her BP had been increasing again. She took the medication for x4-5 days without improvement of her blood pressures- they were up to [id number] systolic. She reports having significant stressors with her relationship with her son last month and feels that her elevated pressures may be related.  Since she did not see improvement she discontinued the Amlodipine and started taking Bisoprolol 5mg  which she had taken in the past.   However, within a few days she started to feel poorly with low blood pressures. She provided her BP log while on only the  Bisoprolol: 05/10/22: 136/64 in the morning and 163/72 at 1PM 05/11/22: 110/62 05/12/22: 98/41  She uses a wrist BP cuff and brought this in today to compare to our BP results. Her wrist cuff was 13 points higher on the systolic pressure and the same diastolic when compared to our cuff. Due to her symptoms and low pressures she stopped taking the Bisoprolol after yesterday's dose.  She reports that her fingertips and toes have been blue and cold intermittently over the last few  months. She denies any history of rheumatologic disorders, connective tissue disorders, or Raynaud's.   On occasion, she will take Adderal to keep her awake, especially with driving long distances.  She started using a dental device in 12/23 for her OSA. She states that she has started clenching her teeth since then which results in frontal headaches. She was advised to try Prednisone by an different health care practitioner. Consider tylenol.    Past Medical History:  Diagnosis Date   ADD (attention deficit disorder)    Anxiety    Depression    GERD (gastroesophageal reflux disease)    Hyperlipidemia    Hypertension    Hypothyroid    PONV (postoperative nausea and vomiting)    Vitamin D deficiency     Past Surgical History:  Procedure Laterality Date   BREAST BIOPSY Left 07/09/2019   CESAREAN SECTION     ESOPHAGOGASTRODUODENOSCOPY N/A 09/02/2015   Procedure: ESOPHAGOGASTRODUODENOSCOPY (EGD);  Surgeon: Laurence Spates, MD;  Location: [facility] ENDOSCOPY;  Service: Endoscopy;  Laterality: N/A;   FOOT SURGERY Right    IM NAILING TIBIA Left 01/09/2001   Dr. Maia Breslow, [place]   TONSILLECTOMY  2002    Current Medications: Current Meds  Medication Sig   ALPRAZolam (XANAX) 1 MG tablet TAKE ONE-HALF TO ONE TABLET BY MOUTH EVERY NIGHT AT BEDTIME IF NEEDED FOR SLEEP. LIMIT TO FIVE DAYS PER WEEK TO AVOID ADDICTION AND DEMENTIA   amphetamine-dextroamphetamine (ADDERALL) 30 MG tablet Take 1 tablet by mouth daily. (Patient taking differently: Take 15 mg by mouth as needed.)   aspirin 81 MG chewable tablet 3 days a week   cetirizine (ZYRTEC) 10 MG tablet 1 tablet   Cyanocobalamin (B-12) 1000 MCG TABS Take by mouth.   levothyroxine (SYNTHROID) 75 MCG tablet Take  1.5  tablet  Daily  M-W-F and 1 tablet daily the other days on an empty stomach with only water for 30 minutes & no Antacid meds, Calcium or Magnesium for 4 hours & avoid Biotin   meloxicam (MOBIC) 15 MG tablet TAKE 1/2 TO 1 TABLET BY  MOUTH DAILY WITH FOOD FOR PAIN AND INFLAMMATION   Multiple Vitamin (MULTIVITAMIN) capsule Take 1 capsule by mouth daily.   olmesartan (BENICAR) 40 MG tablet Take 1 tablet every Night  for BP   omeprazole (PRILOSEC) 40 MG capsule TAKE ONE CAPSULE BY MOUTH DAILY TO PREVENT HEARTBURN AND INDIGESTION (Patient taking differently: 3 days per week)   RESTASIS 0.05 % ophthalmic emulsion Place 1 drop into both eyes 2 (two) times daily.   vitamin C (ASCORBIC ACID) 500 MG tablet Take 1,200 mg by mouth daily.   Vitamin D, Ergocalciferol, (DRISDOL) 1.25 MG (50000 UNIT) CAPS capsule TAKE 1 CAPSULE BY MOUTH 3 TIMES A WEEK ON MONDAY- WEDNESDAY- FRIDAY FOR SEVERE VITAMIN D. DEFICIENCY    Current Outpatient Medications on File Prior to Visit  Medication Sig Dispense Refill   ALPRAZolam (XANAX) 1 MG tablet TAKE ONE-HALF TO ONE TABLET BY MOUTH EVERY NIGHT AT BEDTIME IF  NEEDED FOR SLEEP. LIMIT TO FIVE DAYS PER WEEK TO AVOID ADDICTION AND DEMENTIA 30 tablet 0   amphetamine-dextroamphetamine (ADDERALL) 30 MG tablet Take 1 tablet by mouth daily. (Patient taking differently: Take 15 mg by mouth as needed.) 30 tablet 0   aspirin 81 MG chewable tablet 3 days a week     cetirizine (ZYRTEC) 10 MG tablet 1 tablet     Cyanocobalamin (B-12) 1000 MCG TABS Take by mouth.     levothyroxine (SYNTHROID) 75 MCG tablet Take  1.5  tablet  Daily  M-W-F and 1 tablet daily the other days on an empty stomach with only water for 30 minutes & no Antacid meds, Calcium or Magnesium for 4 hours & avoid Biotin 135 tablet 1   meloxicam (MOBIC) 15 MG tablet TAKE 1/2 TO 1 TABLET BY MOUTH DAILY WITH FOOD FOR PAIN AND INFLAMMATION 90 tablet 1   Multiple Vitamin (MULTIVITAMIN) capsule Take 1 capsule by mouth daily.     olmesartan (BENICAR) 40 MG tablet Take 1 tablet every Night  for BP 90 tablet 3   omeprazole (PRILOSEC) 40 MG capsule TAKE ONE CAPSULE BY MOUTH DAILY TO PREVENT HEARTBURN AND INDIGESTION (Patient taking differently: 3 days per week) 90  capsule 1   RESTASIS 0.05 % ophthalmic emulsion Place 1 drop into both eyes 2 (two) times daily.     vitamin C (ASCORBIC ACID) 500 MG tablet Take 1,200 mg by mouth daily.     Vitamin D, Ergocalciferol, (DRISDOL) 1.25 MG (50000 UNIT) CAPS capsule TAKE 1 CAPSULE BY MOUTH 3 TIMES A WEEK ON MONDAY- WEDNESDAY- FRIDAY FOR SEVERE VITAMIN D. DEFICIENCY 36 capsule 3   losartan (COZAAR) 50 MG tablet Take 1 tablet (50 mg total) by mouth daily. (Patient not taking: Reported on 03/17/2022) 30 tablet 11   No current facility-administered medications on file prior to visit.     Allergies:   Levofloxacin, Buspirone, Cefprozil, Codeine, Hydrocodone-acetaminophen, Morphine, Nitrofuran derivatives, Pravastatin, and Lexapro [escitalopram oxalate]   Social History   Tobacco Use   Smoking status: Never   Smokeless tobacco: Never  Substance Use Topics   Alcohol use: Yes    Comment: occasional   Drug use: No     Family History: The patient's family history includes Breast cancer in her cousin and mother; Cancer (age of onset: 54) in her mother; Heart disease in her father; Hypertension in her father.  ROS:   Please see the history of present illness.    + cold/blue discoloration of fingertips and toes + teeth clenching + headaches All other systems reviewed and are negative.  EKGs/Labs/Other Studies Reviewed:    The following studies were personally reviewed by me today:  Echo 04/11/22:  1. Left ventricular ejection fraction, by estimation, is 60 to 65%. The  left ventricle has normal function. The left ventricle has no regional  wall motion abnormalities. There is mild concentric left ventricular  hypertrophy. Left ventricular diastolic  parameters are indeterminate.   2. Right ventricular systolic function is normal. The right ventricular  size is normal. There is normal pulmonary artery systolic pressure. The  estimated right ventricular systolic pressure is [id number] mmHg.   3. The mitral valve is  normal in structure. No evidence of mitral valve  regurgitation. No evidence of mitral stenosis.   4. The aortic valve is tricuspid. Aortic valve regurgitation is not  visualized. Aortic valve sclerosis/calcification is present, without any  evidence of aortic stenosis.   5. The inferior vena cava is normal in size  with greater than 50%  respiratory variability, suggesting right atrial pressure of 3 mmHg.   Vascular renal artery bilateral US 03/31/22: Summary:  Largest Aortic Diameter: 2.5 cm  Renal:  Right: Normal size right kidney. Abnormal right Resistive Index.         Abnormal cortical thickness of right kidney. 1-59% stenosis         of the right renal artery. RRV flow present.  Left:  Normal size of left kidney. Normal left Resistive Index.         Normal cortical thickness of the left kidney. 1-59% stenosis         of the left renal artery. LRV flow present.  Mesenteric:  70 to 99% stenosis in the celiac artery and superior mesenteric artery.  Patent IVC.   NM Stress test with Lexiscan 03/25/22:   The study is normal. The study is low risk.   No ST deviation was noted.   LV perfusion is normal. There is no evidence of ischemia. There is no evidence of infarction.   Left ventricular function is normal. Nuclear stress EF: 69 %. The left ventricular ejection fraction is hyperdynamic (>65%). End diastolic cavity size is normal. End systolic cavity size is normal. No evidence of transient ischemic dilation (TID) noted.   Prior study not available for comparison.  10/27/2020  CT Abdomen/Pelvis: IMPRESSION: 1. No CT findings of the abdomen or pelvis to explain right lower quadrant abdominal pain. 2. Normal appendix. Aortic Atherosclerosis (ICD10-I70.0).  EKG:  EKG is personally reviewed. 05/13/22: NSR. 03/17/2022: Sinus rhythm. Bi-atrial enlargement.   Recent Labs: 02/28/2022: ALT 25; BUN 11; Creat 0.96; Hemoglobin 15.3; Magnesium 2.2; Platelets 398; Potassium 4.1; Sodium  142 03/28/2022: TSH 0.70   Recent Lipid Panel    Component Value Date/Time   CHOL 181 10/15/2021 1155   TRIG 181 (H) 10/15/2021 1155   HDL 53 10/15/2021 1155   CHOLHDL 3.4 10/15/2021 1155   VLDL 39 (H) 06/23/2016 1044   LDLCALC 100 (H) 10/15/2021 1155   LDLDIRECT 138.2 04/25/2008 1514    Physical Exam:    VS:  BP (!) 150/72   Pulse 68   Ht 5\' 2"  (1.575 m)   Wt 155 lb 6.4 oz (70.5 kg)   SpO2 96%   BMI 28.42 kg/m     Wt Readings from Last 5 Encounters:  05/13/22 155 lb 6.4 oz (70.5 kg)  04/26/22 157 lb 9.6 oz (71.5 kg)  03/28/22 155 lb 3.2 oz (70.4 kg)  03/25/22 155 lb (70.3 kg)  03/17/22 155 lb (70.3 kg)    Constitutional: No acute distress Eyes: sclera non-icteric, normal conjunctiva and lids ENMT: normal dentition, moist mucous membranes Cardiovascular: regular rhythm, normal rate, no murmur. S1 and S2 normal. No jugular venous distention.  Respiratory: clear to auscultation bilaterally GI : normal bowel sounds, soft and nontender. No distention.   MSK: extremities warm, well perfused. No edema.  NEURO: grossly nonfocal exam, moves all extremities. PSYCH: alert and oriented x 3, normal mood and affect.   ASSESSMENT:    1. Hypertension, unspecified type   2. Chest pain of uncertain etiology   3. Obstructive sleep apnea   4. 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  - Home BP has been stable apart from some elevated pressures in February which she attributes to family stressors. - Discontinue Amlodipine and Bisoprolol at this time. - Referred to Dr. Skeet Latch in HTN clinic. - Advised to continue keeping a BP log with associated symptoms and medication use.  Chest pain - Lexiscan myoview scan reassuring. - If she has recurrence of chest pain, advised her to go to the ED for further evaluation and contact our office.   OSA Anxiety - She started using dental device in 12/24. - She reports jaw clenching with secondary tension headaches. - Advised to use Tylenol prn for headache relief (no more than 2-3 g per day)  Renal artery stenosis Mesenteric artery stenosis - Discussed renal vascular US results with her today, no evidence of significant renal artery stenosis.   Peripheral cyanosis - New onset x2-3 months. - no diagnosed history of rheumatologic or connective tissue issues as well as Raynaud's. - Will order bubble study on echo. Will consider MRI or RHC as needed. Normal peripheral sat.    Follow up: 4-6 months  Total time of encounter: 40 minutes total time of encounter, including 30 minutes spent in face-to-face patient care on the date of this encounter. This time includes coordination of care and counseling regarding above mentioned problem list. Remainder of non-face-to-face time involved reviewing chart documents/testing relevant to the patient encounter and documentation in the medical record. I have independently reviewed documentation from referring provider.   Cherlynn Kaiser, MD, [place]    Shared Decision Making/Informed Consent:   Shared Decision Making/Informed Consent The risks [chest pain, shortness of breath, cardiac arrhythmias, dizziness, blood pressure fluctuations, myocardial infarction, stroke/transient ischemic attack, nausea, vomiting, allergic reaction, radiation  exposure, metallic taste sensation and life-threatening complications (estimated to be 1 in 10,000)], benefits (risk stratification, diagnosing coronary artery disease, treatment guidance) and alternatives of a nuclear stress test were discussed in detail with Ms. Sobel and she agrees to proceed.   Medication Adjustments/Labs and Tests Ordered: Current medicines are reviewed at length with the patient today.  Concerns regarding medicines are outlined above.   Orders Placed This Encounter  Procedures   AMB REFERRAL TO ADVANCED HTN CLINIC   EKG 12-Lead   ECHOCARDIOGRAM LIMITED BUBBLE STUDY   No orders of the defined types were placed in this encounter.  Patient Instructions  Medication Instructions:   STOP AMLODIPINE   STOP BISOPROLOL  *If you need a refill on your cardiac medications before your next appointment, please call your pharmacy*'  Your physician has requested that you have an echocardiogram. Echocardiography is a painless test that uses sound waves to create images of your heart. It provides your doctor with information about the size and shape of your heart and how well your heart's chambers and valves are working. You may receive an ultrasound enhancing agent through an IV if needed to better visualize your heart during the echo.This procedure takes approximately one hour. There are no restrictions for this procedure. This will take place at the [address], [address].   Follow-Up: At [facility], you and your health needs are our priority.  As part of our continuing mission to provide you with exceptional heart care, we have created designated Provider Care Teams.  These Care Teams include your primary Cardiologist (physician) and Advanced Practice Providers (APPs -  Physician Assistants and Nurse Practitioners) who all work together to provide you with the care you need, when you need it.  REFERRAL TO Dr. Oval Linsey- SOMEONE WILL CALL TO GET [place], NP  Your next appointment:   4-6 MONTHS WITH Dr. Margaretann Loveless   Provider:   [name], MD       I,Alexis Herring,acting as a scribe for [name], MD.,have documented all relevant documentation on the behalf of [name], MD,as directed by  [name], MD while in the presence of [name], MD.  I, [name], MD, have reviewed all documentation for the visit on 05/13/2022. The documentation on today's date of service for the exam, diagnosis, procedures, and orders are all accurate and complete.

## 2022-05-13 NOTE — Patient Instructions (Addendum)
Medication Instructions:   STOP AMLODIPINE   STOP BISOPROLOL  *If you need a refill on your cardiac medications before your next appointment, please call your pharmacy*'  Your physician has requested that you have an echocardiogram. Echocardiography is a painless test that uses sound waves to create images of your heart. It provides your doctor with information about the size and shape of your heart and how well your heart's chambers and valves are working. You may receive an ultrasound enhancing agent through an IV if needed to better visualize your heart during the echo.This procedure takes approximately one hour. There are no restrictions for this procedure. This will take place at the [address], [address].   Follow-Up: At [facility], you and your health needs are our priority.  As part of our continuing mission to provide you with exceptional heart care, we have created designated Provider Care Teams.  These Care Teams include your primary Cardiologist (physician) and Advanced Practice Providers (APPs -  Physician Assistants and Nurse Practitioners) who all work together to provide you with the care you need, when you need it.  REFERRAL TO Dr. Oval Linsey- SOMEONE [place], NP  Your next appointment:   4-6 MONTHS WITH Dr. Margaretann Loveless   Provider:   [name], MD

## 2022-05-23 ENCOUNTER — Encounter: Payer: Self-pay | Admitting: Nurse Practitioner

## 2022-05-25 DIAGNOSIS — H35363 Drusen (degenerative) of macula, bilateral: Secondary | ICD-10-CM | POA: Diagnosis not present

## 2022-05-25 DIAGNOSIS — H16223 Keratoconjunctivitis sicca, not specified as Sjogren's, bilateral: Secondary | ICD-10-CM | POA: Diagnosis not present

## 2022-05-25 DIAGNOSIS — H25813 Combined forms of age-related cataract, bilateral: Secondary | ICD-10-CM | POA: Diagnosis not present

## 2022-05-25 DIAGNOSIS — H43393 Other vitreous opacities, bilateral: Secondary | ICD-10-CM | POA: Diagnosis not present

## 2022-06-01 NOTE — Progress Notes (Unsigned)
Cardiology Clinic Note   Patient Name: Diana Wolfe Date of Encounter: 06/02/2022  Primary Care Provider:  Lucky Cowboy, MD Primary Cardiologist:  Parke Poisson, MD  Patient Profile    Diana Wolfe 72 year old female presents the clinic today for follow-up evaluation of her hypertension and hyperlipidemia.  Past Medical History    Past Medical History:  Diagnosis Date   ADD (attention deficit disorder)    Anxiety    Depression    GERD (gastroesophageal reflux disease)    Hyperlipidemia    Hypertension    Hypothyroid    PONV (postoperative nausea and vomiting)    Vitamin D deficiency    Past Surgical History:  Procedure Laterality Date   BREAST BIOPSY Left 07/09/2019   CESAREAN SECTION     ESOPHAGOGASTRODUODENOSCOPY N/A 09/02/2015   Procedure: ESOPHAGOGASTRODUODENOSCOPY (EGD);  Surgeon: Carman Ching, MD;  Location: [facility] ENDOSCOPY;  Service: Endoscopy;  Laterality: N/A;   FOOT SURGERY Right    IM NAILING TIBIA Left 01/09/2001   Dr. Doristine Section, [place]   TONSILLECTOMY  2002    Allergies  Allergies  Allergen Reactions   Levofloxacin     Neck sweling/difficult breathing   Buspirone    Cefprozil    Codeine     REACTION: vomiting   Hydrocodone-Acetaminophen    Morphine    Nitrofuran Derivatives Itching   Pravastatin Swelling   Lexapro [Escitalopram Oxalate] Other (See Comments)    Tapping/abnormal movements    History of Present Illness    Diana Wolfe has a PMH of aortic atherosclerosis, HTN, HLD, hypothyroidism, GERD, ADD, and labile blood pressure.  She was seen by Dr. Jacques Navy as a consult for labile blood pressure 03/17/2022.  She felt that her anxiety medication had a side effect of increasing her blood pressures.  She noted that her blood pressures were elevated in the 150-160 systolic range.  During her initial BP spike her blood pressure was in the 180s over 120s.  She noted chest tightness and shortness of breath.  She was  diagnosed with UTI the same day.  She noted that with low blood pressure and heart rate she felt significant fatigue.  At that time she stopped all of her antianxiety medications and antihypertensive medications.  She reported being off of blood pressure medications since 03/06/2022.  Her last dose of buspirone was reported 1/19.  Her blood pressure log showed blood pressures in the 100 teens-160s over 58-78.  She also noted struggling with management of her thyroid for about 9 months.  She had stopped her thyroid medicine, had resumed it and titrated up to 75 mcg daily.  She was seen in follow-up by Wallis Bamberg and was started on amlodipine 2.5 mg daily  She was seen in follow-up by Dr. Jacques Navy on 05/13/2022.  She had been on amlodipine x 1 week.  She had taken the medication 4-5 days without significant blood pressure improvement.  She reported significant stressors.  Since she did not notice improvement she had discontinued amlodipine.  She had started taking bisoprolol 5 mg which she had taken in the past.  Within a few days she noted that she was feeling poorly and reported blood pressures in the 110-98 systolic over 62-41 diastolic.  Due to her symptoms and low blood pressures she has stopped taking bisoprolol day before her follow-up appointment.  She also reported that she has started using a dental device 12/23 for her OSA.  She indicated that she had started clenching  her teeth which resulted in frontal headaches.  Tylenol was recommended.  She was referred to the advanced hypertension clinic.  Echocardiogram was ordered.  She presents to the clinic today for follow-up evaluation and states she is feeling somewhat better since being off of blood pressure medication.  She brings in a blood pressure log which shows blood pressures in the 120s over 60s-70s.  She does have intermittent elevated blood pressures in the 140-150 systolic range.  We reviewed her previous antihypertensive medication.   She reports decreased heart rate into the 30s.  She does note significant anxiety and tries to use mindfulness stress reduction.  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  Her TSH has been checked and was normal.  She notes that 3 days ago she had elevated heart rate.  Her Apple Watch was reviewed and notes heart rates in the 120 range.  She reports that she was not exercising.  She does note that she has trouble with hydration.  We reviewed triggers for palpitations.  I will order a 7-day cardiac event monitor and plan follow-up for after monitor.  Today she denies chest pain and shortness of breath.  She denies bleeding issues.  Home Medications    Prior to Admission medications   Medication Sig Start Date End Date Taking? Authorizing Provider  ALPRAZolam (XANAX) 1 MG tablet TAKE ONE-HALF TO ONE TABLET BY MOUTH EVERY NIGHT AT BEDTIME IF NEEDED FOR SLEEP. LIMIT TO FIVE DAYS PER WEEK TO AVOID ADDICTION AND DEMENTIA 04/25/22   Raynelle DickWilkinson, Dana E, NP  amphetamine-dextroamphetamine (ADDERALL) 30 MG tablet Take 1 tablet by mouth daily. Patient taking differently: Take 15 mg by mouth as needed. 04/29/22 04/29/23  Raynelle DickWilkinson, Dana E, NP  aspirin 81 MG chewable tablet 3 days a week    [provider]  cetirizine (ZYRTEC) 10 MG tablet 1 tablet    [provider]  Cyanocobalamin (B-12) 1000 MCG TABS Take by mouth.    [provider]  levothyroxine (SYNTHROID) 75 MCG tablet Take  1.5  tablet  Daily  M-W-F and 1 tablet daily the other days on an empty stomach with only water for 30 minutes & no Antacid meds, Calcium or Magnesium for 4 hours & avoid Biotin 03/29/22   Raynelle DickWilkinson, Dana E, NP  losartan (COZAAR) 50 MG tablet Take 1 tablet (50 mg total) by mouth daily. Patient not taking: Reported on 03/17/2022 02/18/22 02/18/23  Raynelle DickWilkinson, Dana E, NP  meloxicam (MOBIC) 15 MG tablet TAKE 1/2 TO 1 TABLET BY MOUTH DAILY WITH FOOD FOR PAIN AND  INFLAMMATION 02/09/22   Raynelle DickWilkinson, Dana E, NP  Multiple Vitamin (MULTIVITAMIN) capsule Take 1 capsule by mouth daily.    [provider]  olmesartan (BENICAR) 40 MG tablet Take 1 tablet every Night  for BP 02/28/22   Lucky CowboyMcKeown, William, MD  omeprazole (PRILOSEC) 40 MG capsule TAKE ONE CAPSULE BY MOUTH DAILY TO PREVENT HEARTBURN AND INDIGESTION Patient taking differently: 3 days per week 07/21/21   Raynelle DickWilkinson, Dana E, NP  RESTASIS 0.05 % ophthalmic emulsion Place 1 drop into both eyes 2 (two) times daily. 04/05/22   [provider]  vitamin C (ASCORBIC ACID) 500 MG tablet Take 1,200 mg by mouth daily.    [provider]  Vitamin D, Ergocalciferol, (DRISDOL) 1.25 MG (50000 UNIT) CAPS capsule TAKE 1 CAPSULE BY MOUTH 3 TIMES A WEEK ON MONDAY- WEDNESDAY- FRIDAY FOR SEVERE VITAMIN D. DEFICIENCY 02/07/22   Aundria RudWilkinson,  Rosalva Ferron, NP    Family History    Family History  Problem Relation Age of Onset   Breast cancer Mother        80s   Cancer Mother 26       Breast   Heart disease Father    Hypertension Father    Breast cancer Cousin        twice 5, 23   She indicated that the status of her mother is unknown. She indicated that the status of her father is unknown. She indicated that the status of her cousin is unknown.  Social History    Social History   Socioeconomic History   Marital status: Divorced    Spouse name: Not on file   Number of children: Not on file   Years of education: Not on file   Highest education level: Not on file  Occupational History   Not on file  Tobacco Use   Smoking status: Never   Smokeless tobacco: Never  Substance and Sexual Activity   Alcohol use: Yes    Comment: occasional   Drug use: No   Sexual activity: Not on file  Other Topics Concern   Not on file  Social History Narrative   Not on file   Social Determinants of Health   Financial Resource Strain: Low Risk  (11/11/2021)   Overall Financial Resource Strain (CARDIA)     Difficulty of Paying Living Expenses: Not hard at all  Food Insecurity: No Food Insecurity (11/11/2021)   Hunger Vital Sign    Worried About Running Out of Food in the Last Year: Never true    Ran Out of Food in the Last Year: Never true  Transportation Needs: No Transportation Needs (11/11/2021)   PRAPARE - Administrator, Civil Service (Medical): No    Lack of Transportation (Non-Medical): No  Physical Activity: Not on file  Stress: Not on file  Social Connections: Moderately Isolated (11/11/2021)   Social Connection and Isolation Panel [NHANES]    Frequency of Communication with Friends and Family: Twice a week    Frequency of Social Gatherings with Friends and Family: Once a week    Attends Religious Services: Never    Database administrator or Organizations: Yes    Attends Engineer, structural: More than 4 times per year    Marital Status: Divorced  Catering manager Violence: Not on file     Review of Systems    General:  No chills, fever, night sweats or weight changes.  Cardiovascular:  No chest pain, dyspnea on exertion, edema, orthopnea, palpitations, paroxysmal nocturnal dyspnea. Dermatological: No rash, lesions/masses Respiratory: No cough, dyspnea Urologic: No hematuria, dysuria Abdominal:   No nausea, vomiting, diarrhea, bright red blood per rectum, melena, or hematemesis Neurologic:  No visual changes, wkns, changes in mental status. All other systems reviewed and are otherwise negative except as noted above.  Physical Exam    VS:  BP 138/86 (BP Location: Left Arm, Patient Position: Sitting, Cuff Size: Normal)   Pulse 67   Ht 5\' 2"  (1.575 m)   Wt 157 lb 3.2 oz (71.3 kg)   SpO2 97%   BMI 28.75 kg/m  , BMI Body mass index is 28.75 kg/m. GEN: Well nourished, well developed, in no acute distress. HEENT: normal. Neck: Supple, no JVD, carotid bruits, or masses. Cardiac: RRR, no murmurs, rubs, or gallops. No clubbing, cyanosis, edema.   Radials/DP/PT 2+ and equal bilaterally.  Respiratory:  Respirations regular  and unlabored, clear to auscultation bilaterally. GI: Soft, nontender, nondistended, BS + x 4. MS: no deformity or atrophy. Skin: warm and dry, no rash. Neuro:  Strength and sensation are intact. Psych: Normal affect.  Accessory Clinical Findings    Recent Labs: 02/28/2022: ALT 25; BUN 11; Creat 0.96; Hemoglobin 15.3; Magnesium 2.2; Platelets 398; Potassium 4.1; Sodium 142 03/28/2022: TSH 0.70   Recent Lipid Panel    Component Value Date/Time   CHOL 181 10/15/2021 1155   TRIG 181 (H) 10/15/2021 1155   HDL 53 10/15/2021 1155   CHOLHDL 3.4 10/15/2021 1155   VLDL 39 (H) 06/23/2016 1044   LDLCALC 100 (H) 10/15/2021 1155   LDLDIRECT 138.2 04/25/2008 1514         ECG personally reviewed by me today-none today.  Echo 04/11/22:  1. Left ventricular ejection fraction, by estimation, is 60 to 65%. The  left ventricle has normal function. The left ventricle has no regional  wall motion abnormalities. There is mild concentric left ventricular  hypertrophy. Left ventricular diastolic  parameters are indeterminate.   2. Right ventricular systolic function is normal. The right ventricular  size is normal. There is normal pulmonary artery systolic pressure. The  estimated right ventricular systolic pressure is 28.0 mmHg.   3. The mitral valve is normal in structure. No evidence of mitral valve  regurgitation. No evidence of mitral stenosis.   4. The aortic valve is tricuspid. Aortic valve regurgitation is not  visualized. Aortic valve sclerosis/calcification is present, without any  evidence of aortic stenosis.   5. The inferior vena cava is normal in size with greater than 50%  respiratory variability, suggesting right atrial pressure of 3 mmHg.    Vascular renal artery bilateral US 03/31/22: Summary:  Largest Aortic Diameter: 2.5 cm  Renal:  Right: Normal size right kidney. Abnormal right Resistive Index.          Abnormal cortical thickness of right kidney. 1-59% stenosis         of the right renal artery. RRV flow present.  Left:  Normal size of left kidney. Normal left Resistive Index.         Normal cortical thickness of the left kidney. 1-59% stenosis         of the left renal artery. LRV flow present.  Mesenteric:  70 to 99% stenosis in the celiac artery and superior mesenteric artery.  Patent IVC.    NM Stress test with Lexiscan 03/25/22:   The study is normal. The study is low risk.   No ST deviation was noted.   LV perfusion is normal. There is no evidence of ischemia. There is no evidence of infarction.   Left ventricular function is normal. Nuclear stress EF: 69 %. The left ventricular ejection fraction is hyperdynamic (>65%). End diastolic cavity size is normal. End systolic cavity size is normal. No evidence of transient ischemic dilation (TID) noted.   Prior study not available for comparison.   10/27/2020  CT Abdomen/Pelvis: IMPRESSION: 1. No CT findings of the abdomen or pelvis to explain right lower quadrant abdominal pain. 2. Normal appendix. Aortic Atherosclerosis (ICD10-I70.0).  Assessment & Plan   1.  Palpitations-notes 3 days of increased heart rate.  Heart rate was recorded in the 120s via smart watch.  Appears to be related to anxiety. Order 7-day cardiac event monitor Recent lab work with TSH normal.  HTN-BP today 138/86.  Labile blood pressures at home.  Renal ultrasound showed no evidence of renal artery stenosis.  Appears anxiety is a contributing factor. Heart healthy low-sodium diet-salty 6 given Increase physical activity as tolerated Avoid secondary causes of hypertension. Follow-up with advanced hypertension clinic  Chest discomfort-no chest pain today.  Denies recent episodes of chest discomfort.  Stress testing reassuring. No plans for ischemic evaluation  Obstructive sleep apnea-compliant with dental device. Avoid supine sleep, elevate head of  bed. Continue dental device  Anxiety-unable to tolerate bisoprolol and buspirone. Monitor with stress reduction sheet given Follows with PCP  Disposition: Follow-up with Dr. Jacques Navy and or me in 6/8 weeks   Thomasene Ripple. Mahamed Zalewski NP-C     06/02/2022, 12:25 PM [facility] [address] Office [phone] Fax [phone]    I spent 14 minutes examining this patient, reviewing medications, and using patient centered shared decision making involving her cardiac care.  Prior to her visit I spent greater than 20 minutes reviewing her past medical history,  medications, and prior cardiac tests.

## 2022-06-02 ENCOUNTER — Encounter: Payer: Self-pay | Admitting: General Practice

## 2022-06-02 ENCOUNTER — Ambulatory Visit: Payer: Medicare Other | Attending: General Practice | Admitting: General Practice

## 2022-06-02 VITALS — BP 138/86 | HR 67 | Ht 62.0 in | Wt 157.2 lb

## 2022-06-02 DIAGNOSIS — I10 Essential (primary) hypertension: Secondary | ICD-10-CM

## 2022-06-02 DIAGNOSIS — G4733 Obstructive sleep apnea (adult) (pediatric): Secondary | ICD-10-CM | POA: Diagnosis not present

## 2022-06-02 DIAGNOSIS — R002 Palpitations: Secondary | ICD-10-CM | POA: Diagnosis not present

## 2022-06-02 DIAGNOSIS — R230 Cyanosis: Secondary | ICD-10-CM | POA: Diagnosis not present

## 2022-06-02 DIAGNOSIS — R079 Chest pain, unspecified: Secondary | ICD-10-CM

## 2022-06-02 NOTE — Patient Instructions (Signed)
Medication Instructions:  The current medical regimen is effective;  continue present plan and medications as directed. Please refer to the Current Medication list given to you today.  *If you need a refill on your cardiac medications before your next appointment, please call your pharmacy*  Lab Work: NONE If you have labs (blood work) drawn today and your tests are completely normal, you will receive your results only by:  MyChart Message (if you have MyChart) OR  A paper copy in the mail If you have any lab test that is abnormal or we need to change your treatment, we will call you to review the results.  Testing/Procedures: Your physician has requested you wear your cardiac event monitor for ___7__ days, (1-30). SEE BELOW  Follow-Up: At [facility], you and your health needs are our priority.  As part of our continuing mission to provide you with exceptional heart care, we have created designated Provider Care Teams.  These Care Teams include your primary Cardiologist (physician) and Advanced Practice Providers (APPs -  Physician Assistants and Nurse Practitioners) who all work together to provide you with the care you need, when you need it.  Your next appointment:   6-8 week(s)  Provider:   Parke Poisson, MD  or Edd Fabian, FNP        Other Instructions INCREASE PHYSICAL ACTIVITY AS TOLERATED-REGULAR DAILY EXERCISE   Please try to avoid these triggers: Do not use any products that have nicotine or tobacco in them. These include cigarettes, e-cigarettes, and chewing tobacco. If you need help quitting, ask your doctor. Eat heart-healthy foods. Talk with your doctor about the right eating plan for you. Exercise regularly as told by your doctor. Stay hydrated Do not drink alcohol, Caffeine or chocolate. Lose weight if you are overweight. Do not use drugs, including cannabis   [employer] Cardiac Event Monitor Instructions  Your physician has requested you wear your  cardiac event monitor for __7___ days, (1-30). [employer] may call or text to confirm a shipping address. The monitor will be sent to a land address via UPS. [employer] will not ship a monitor to a PO BOX. It typically takes 3-5 days to receive your monitor after it has been enrolled. [employer] will assist with USPS tracking if your package is delayed. The telephone number for [employer] is [phone]. Once you have received your monitor, please review the enclosed instructions. Instruction tutorials can also be viewed under help and settings on the enclosed cell phone. Your monitor has already been registered assigning a specific monitor serial # to you.  Billing and Self Pay Discount Information  [employer] has been provided the insurance information we had on file for you.  If your insurance has been updated, please call [employer] at [phone] to provide them with your updated insurance information.   [employer] offers a discounted Self Pay option for patients who have insurance that does not cover their cardiac event monitor or patients without insurance.  The discounted cost of a Self Pay Cardiac Event Monitor would be $225.00 , if the patient contacts [employer] at [phone] within 7 days of applying the monitor to make payment arrangements.  If the patient does not contact [employer] within 7 days of applying the monitor, the cost of the cardiac event monitor will be $350.00.  Applying the monitor  Remove cell phone from case and turn it on. The cell phone works as IT consultant and needs to be within [employer] of you at all times. The cell  phone will need to be charged on a daily basis. We recommend you plug the cell phone into the enclosed charger at your bedside table every night.  Monitor batteries: You will receive two monitor batteries labelled #1 and #2. These are your recorders. Plug battery #2 onto the second connection on the enclosed charger. Keep one battery on  the charger at all times. This will keep the monitor battery deactivated. It will also keep it fully charged for when you need to switch your monitor batteries. A small light will be blinking on the battery emblem when it is charging. The light on the battery emblem will remain on when the battery is fully charged.  Open package of a Monitor strip. Insert battery #1 into black hood on strip and gently squeeze monitor battery onto connection as indicated in instruction booklet. Set aside while preparing skin.  Choose location for your strip, vertical or horizontal, as indicated in the instruction booklet. Shave to remove all hair from location. There cannot be any lotions, oils, powders, or colognes on skin where monitor is to be applied. Wipe skin clean with enclosed Saline wipe. Dry skin completely.  Peel paper labeled #1 off the back of the Monitor strip exposing the adhesive. Place the monitor on the chest in the vertical or horizontal position shown in the instruction booklet. One arrow on the monitor strip must be pointing upward. Carefully remove paper labeled #2, attaching remainder of strip to your skin. Try not to create any folds or wrinkles in the strip as you apply it.  Firmly press and release the circle in the center of the monitor battery. You will hear a small beep. This is turning the monitor battery on. The heart emblem on the monitor battery will light up every 5 seconds if the monitor battery in turned on and connected to the patient securely. Do not push and hold the circle down as this turns the monitor battery off. The cell phone will locate the monitor battery. A screen will appear on the cell phone checking the connection of your monitor strip. This may read poor connection initially but change to good connection within the next minute. Once your monitor accepts the connection you will hear a series of 3 beeps followed by a climbing crescendo of beeps. A screen will  appear on the cell phone showing the two monitor strip placement options. Touch the picture that demonstrates where you applied the monitor strip.  Your monitor strip and battery are waterproof. You are able to shower, bathe, or swim with the monitor on. They just ask you do not submerge deeper than 3 feet underwater. We recommend removing the monitor if you are swimming in a lake, river, or ocean.  Your monitor battery will need to be switched to a fully charged monitor battery approximately once a week. The cell phone will alert you of an action which needs to be made.  On the cell phone, tap for details to reveal connection status, monitor battery status, and cell phone battery status. The green dots indicates your monitor is in good status. A red dot indicates there is something that needs your attention.  To record a symptom, click the circle on the monitor battery. In 30-60 seconds a list of symptoms will appear on the cell phone. Select your symptom and tap save. Your monitor will record a sustained or significant arrhythmia regardless of you clicking the button. Some patients do not feel the heart rhythm irregularities. [employer] will notify  us of any serious or critical events.  Refer to instruction booklet for instructions on switching batteries, changing strips, the Do not disturb or Pause features, or any additional questions.  Call [employer] at [phone], to confirm your monitor is transmitting and record your baseline. They will answer any questions you may have regarding the monitor instructions at that time.  Returning the monitor to [employer]  Place all equipment back into blue box. Peel off strip of paper to expose adhesive and close box securely. There is a prepaid UPS shipping label on this box. Drop in a UPS drop box, or at a UPS facility like [employer]. You may also contact [employer] to arrange UPS to pick up monitor package at your home.  Mindfulness-Based  Stress Reduction Mindfulness-based stress reduction (MBSR) is a program that helps people learn to practice mindfulness. Mindfulness is the practice of consciously paying attention to the present moment. MBSR focuses on developing self-awareness, which lets you respond to life stress without judgment or negative feelings. It can be learned and practiced through techniques such as education, breathing exercises, meditation, and yoga. MBSR includes several mindfulness techniques in one program. MBSR works best when you understand the treatment, are willing to try new things, and can commit to spending time practicing what you learn. MBSR training may include learning about: How your feelings, thoughts, and reactions affect your body. New ways to respond to things that cause negative thoughts to start (triggers). How to notice your thoughts and let go of them. Practicing awareness of everyday things that you normally do without thinking. The techniques and goals of different types of meditation. What are the benefits of MBSR? MBSR can have many benefits, which include helping you to: Develop self-awareness. This means knowing and understanding yourself. Learn skills and attitudes that help you to take part in your own health care. Learn new ways to care for yourself. Be more accepting about how things are, and let things go. Be less judgmental and approach things with an open mind. Be patient with yourself and trust yourself more. MBSR has also been shown to: Reduce negative emotions, such as sadness, overwhelm, and worry. Improve memory and focus. Change how you sense and react to pain. Boost your body's ability to fight infections. Help you connect better with other people. Improve your sense of well-being. How to practice mindfulness To do a basic awareness exercise: Find a comfortable place to sit. Pay attention to the present moment. Notice your thoughts, feelings, and surroundings just  as they are. Avoid judging yourself, your feelings, or your surroundings. Make note of any judgment that comes up and let it go. Your mind may wander, and that is okay. Make note of when your thoughts drift, and return your attention to the present moment. To do basic mindfulness meditation: Find a comfortable place to sit. This may include a stable chair or a firm floor cushion. Sit upright with your back straight. Let your arms fall next to your sides, with your hands resting on your legs. If you are sitting in a chair, rest your feet flat on the floor. If you are sitting on a cushion, cross your legs in front of you. Keep your head in a neutral position with your chin dropped slightly. Relax your jaw and rest the tip of your tongue on the roof of your mouth. Drop your gaze to the floor or close your eyes. Breathe normally and pay attention to your breath. Feel the air moving in and out  of your nose. Feel your belly expanding and relaxing with each breath. Your mind may wander, and that is okay. Make note of when your thoughts drift, and return your attention to your breath. Avoid judging yourself, your feelings, or your surroundings. Make note of any judgment or feelings that come up, let them go, and bring your attention back to your breath. When you are ready, lift your gaze or open your eyes. Pay attention to how your body feels after the meditation. Follow these instructions at home:  Find a local in-person or online MBSR program. Set aside some time regularly for mindfulness practice. Practice every day if you can. Even 10 minutes of practice is helpful. Find a mindfulness practice that works best for you. This may include one or more of the following: Meditation. This involves focusing your mind on a certain thought or activity. Breathing awareness exercises. These help you to stay present by focusing on your breath. Body scan. For this practice, you lie down and pay attention to each  part of your body from head to toe. You can identify tension and soreness and consciously relax parts of your body. Yoga. Yoga involves stretching and breathing, and it can improve your ability to move and be flexible. It can also help you to test your body's limits, which can help you release stress. Mindful eating. This way of eating involves focusing on the taste, texture, color, and smell of each bite of food. This slows down eating and helps you feel full sooner. For this reason, it can be an important part of a weight loss plan. Find a podcast or recording that provides guidance for breathing awareness, body scan, or meditation exercises. You can listen to these any time when you have a free moment to rest without distractions. Follow your treatment plan as told by your health care provider. This may include taking regular medicines and making changes to your diet or lifestyle as recommended. Where to find more information You can find more information about MBSR from: Your health care provider. Community-based meditation centers or programs. Programs offered near you. Summary Mindfulness-based stress reduction (MBSR) is a program that teaches you how to consciously pay attention to the present moment. It is used to help you deal better with daily stress, feelings, and pain. MBSR focuses on developing self-awareness, which allows you to respond to life stress without judgment or negative feelings. MBSR programs may involve learning different mindfulness practices, such as breathing exercises, meditation, yoga, body scan, or mindful eating. Find a mindfulness practice that works best for you, and set aside time for it on a regular basis. This information is not intended to replace advice given to you by your health care provider. Make sure you discuss any questions you have with your health care provider. Document Revised: 09/17/2020 Document Reviewed: 09/17/2020 Elsevier Patient Education   2023 [employer].

## 2022-06-05 ENCOUNTER — Encounter (HOSPITAL_COMMUNITY): Payer: Self-pay

## 2022-06-05 ENCOUNTER — Emergency Department (HOSPITAL_COMMUNITY): Payer: Medicare Other | Admitting: Anesthesiology

## 2022-06-05 ENCOUNTER — Emergency Department (HOSPITAL_BASED_OUTPATIENT_CLINIC_OR_DEPARTMENT_OTHER): Payer: Medicare Other | Admitting: Anesthesiology

## 2022-06-05 ENCOUNTER — Encounter (HOSPITAL_COMMUNITY)
Admission: EM | Disposition: A | Discharge status: Home / Self Care | Payer: Self-pay | Source: Home / Self Care | Attending: Emergency Medicine

## 2022-06-05 ENCOUNTER — Emergency Department (HOSPITAL_COMMUNITY): Payer: Medicare Other

## 2022-06-05 ENCOUNTER — Ambulatory Visit (HOSPITAL_COMMUNITY)
Admission: EM | Admit: 2022-06-05 | Discharge: 2022-06-05 | Disposition: A | Discharge status: Home / Self Care | Payer: Medicare Other | Attending: Emergency Medicine | Admitting: Emergency Medicine

## 2022-06-05 DIAGNOSIS — E785 Hyperlipidemia, unspecified: Secondary | ICD-10-CM | POA: Diagnosis not present

## 2022-06-05 DIAGNOSIS — R1310 Dysphagia, unspecified: Secondary | ICD-10-CM | POA: Diagnosis present

## 2022-06-05 DIAGNOSIS — I10 Essential (primary) hypertension: Secondary | ICD-10-CM

## 2022-06-05 DIAGNOSIS — F32A Depression, unspecified: Secondary | ICD-10-CM | POA: Insufficient documentation

## 2022-06-05 DIAGNOSIS — K219 Gastro-esophageal reflux disease without esophagitis: Secondary | ICD-10-CM | POA: Insufficient documentation

## 2022-06-05 DIAGNOSIS — F909 Attention-deficit hyperactivity disorder, unspecified type: Secondary | ICD-10-CM | POA: Diagnosis not present

## 2022-06-05 DIAGNOSIS — Z885 Allergy status to narcotic agent status: Secondary | ICD-10-CM | POA: Diagnosis not present

## 2022-06-05 DIAGNOSIS — Z888 Allergy status to other drugs, medicaments and biological substances status: Secondary | ICD-10-CM | POA: Diagnosis not present

## 2022-06-05 DIAGNOSIS — E039 Hypothyroidism, unspecified: Secondary | ICD-10-CM | POA: Insufficient documentation

## 2022-06-05 DIAGNOSIS — R21 Rash and other nonspecific skin eruption: Secondary | ICD-10-CM

## 2022-06-05 DIAGNOSIS — Z79899 Other long term (current) drug therapy: Secondary | ICD-10-CM | POA: Insufficient documentation

## 2022-06-05 DIAGNOSIS — Z881 Allergy status to other antibiotic agents status: Secondary | ICD-10-CM | POA: Diagnosis not present

## 2022-06-05 DIAGNOSIS — F419 Anxiety disorder, unspecified: Secondary | ICD-10-CM | POA: Diagnosis not present

## 2022-06-05 DIAGNOSIS — K222 Esophageal obstruction: Secondary | ICD-10-CM | POA: Insufficient documentation

## 2022-06-05 DIAGNOSIS — T18128A Food in esophagus causing other injury, initial encounter: Secondary | ICD-10-CM

## 2022-06-05 DIAGNOSIS — K2210 Ulcer of esophagus without bleeding: Secondary | ICD-10-CM | POA: Insufficient documentation

## 2022-06-05 DIAGNOSIS — W449XXA Unspecified foreign body entering into or through a natural orifice, initial encounter: Secondary | ICD-10-CM | POA: Diagnosis not present

## 2022-06-05 DIAGNOSIS — K208 Other esophagitis without bleeding: Secondary | ICD-10-CM

## 2022-06-05 DIAGNOSIS — Z7982 Long term (current) use of aspirin: Secondary | ICD-10-CM | POA: Diagnosis not present

## 2022-06-05 DIAGNOSIS — T18108A Unspecified foreign body in esophagus causing other injury, initial encounter: Secondary | ICD-10-CM | POA: Diagnosis not present

## 2022-06-05 HISTORY — PX: FOREIGN BODY REMOVAL: SHX962

## 2022-06-05 HISTORY — PX: ESOPHAGOGASTRODUODENOSCOPY: SHX5428

## 2022-06-05 SURGERY — EGD (ESOPHAGOGASTRODUODENOSCOPY)
Anesthesia: General

## 2022-06-05 MED ORDER — SUCCINYLCHOLINE CHLORIDE 200 MG/10ML IV SOSY
PREFILLED_SYRINGE | INTRAVENOUS | Status: DC | PRN
  Administered 2022-06-05: 100 mg via INTRAVENOUS

## 2022-06-05 MED ORDER — AMISULPRIDE (ANTIEMETIC) 5 MG/2ML IV SOLN: 10.0000 mg | Freq: Once | INTRAVENOUS | Status: DC | PRN

## 2022-06-05 MED ORDER — FENTANYL CITRATE (PF) 250 MCG/5ML IJ SOLN
INTRAMUSCULAR | Status: DC | PRN
  Administered 2022-06-05: 50 ug via INTRAVENOUS

## 2022-06-05 MED ORDER — PROPOFOL 10 MG/ML IV BOLUS
INTRAVENOUS | Status: DC | PRN
  Administered 2022-06-05: 20 mg via INTRAVENOUS
  Administered 2022-06-05: 30 mg via INTRAVENOUS
  Administered 2022-06-05: 150 mg via INTRAVENOUS

## 2022-06-05 MED ORDER — OMEPRAZOLE 40 MG PO CPDR
40.0000 mg | DELAYED_RELEASE_CAPSULE | Freq: Every day | ORAL | 1 refills | Status: AC
Start: 2022-06-05 — End: ?

## 2022-06-05 MED ORDER — EPHEDRINE SULFATE-NACL 50-0.9 MG/10ML-% IV SOSY
PREFILLED_SYRINGE | INTRAVENOUS | Status: DC | PRN
  Administered 2022-06-05: 5 mg via INTRAVENOUS

## 2022-06-05 MED ORDER — FENTANYL CITRATE (PF) 100 MCG/2ML IJ SOLN
INTRAMUSCULAR | Status: AC
  Filled 2022-06-05: qty 2

## 2022-06-05 MED ORDER — LACTATED RINGERS IV SOLN
INTRAVENOUS | Status: AC | PRN
  Administered 2022-06-05: 20 mL/h via INTRAVENOUS

## 2022-06-05 MED ORDER — PHENYLEPHRINE 80 MCG/ML (10ML) SYRINGE FOR IV PUSH (FOR BLOOD PRESSURE SUPPORT)
PREFILLED_SYRINGE | INTRAVENOUS | Status: DC | PRN
  Administered 2022-06-05 (×2): 160 ug via INTRAVENOUS

## 2022-06-05 MED ORDER — FENTANYL CITRATE (PF) 100 MCG/2ML IJ SOLN: 25.0000 ug | INTRAMUSCULAR | Status: DC | PRN

## 2022-06-05 MED ORDER — ONDANSETRON HCL 4 MG/2ML IJ SOLN
INTRAMUSCULAR | Status: DC | PRN
  Administered 2022-06-05: 4 mg via INTRAVENOUS

## 2022-06-05 MED ORDER — DEXAMETHASONE SODIUM PHOSPHATE 10 MG/ML IJ SOLN
INTRAMUSCULAR | Status: DC | PRN
  Administered 2022-06-05: 10 mg via INTRAVENOUS

## 2022-06-05 MED ORDER — LIDOCAINE 2% (20 MG/ML) 5 ML SYRINGE
INTRAMUSCULAR | Status: DC | PRN
  Administered 2022-06-05: 100 mg via INTRAVENOUS

## 2022-06-05 NOTE — ED Notes (Signed)
Pt with dysphagia since last night. States it feels like something is stuck. Attempted carbonated beverages last night with some relief. Pt in NAD at this time.

## 2022-06-05 NOTE — Discharge Instructions (Addendum)
YOU HAD AN ENDOSCOPIC PROCEDURE TODAY: Refer to the procedure report and other information in the discharge instructions given to you for any specific questions about what was found during the examination. If this information does not answer your questions, please call [facility] office at [phone] to clarify.   YOU SHOULD EXPECT: Some feelings of bloating in the abdomen. Passage of more gas than usual. Walking can help get rid of the air that was put into your GI tract during the procedure and reduce the bloating. If you had a lower endoscopy (such as a colonoscopy or flexible sigmoidoscopy) you may notice spotting of blood in your stool or on the toilet paper. Some abdominal soreness may be present for a day or two, also.  DIET: Your first meal following the procedure should be a light meal and then it is ok to progress to your normal diet. A half-sandwich or bowl of soup is an example of a good first meal. Heavy or fried foods are harder to digest and may make you feel nauseous or bloated. Drink plenty of fluids but you should avoid alcoholic beverages for 24 hours. If you had a esophageal dilation, please see attached instructions for diet.    ACTIVITY: Your care partner should take you home directly after the procedure. You should plan to take it easy, moving slowly for the rest of the day. You can resume normal activity the day after the procedure however YOU SHOULD NOT DRIVE, use power tools, machinery or perform tasks that involve climbing or major physical exertion for 24 hours (because of the sedation medicines used during the test).   SYMPTOMS TO REPORT IMMEDIATELY: A gastroenterologist can be reached at any hour. Please call [phone]  for any of the following symptoms:  Following lower endoscopy (colonoscopy, flexible sigmoidoscopy) Excessive amounts of blood in the stool  Significant tenderness, worsening of abdominal pains  Swelling of the abdomen that is new, acute  Fever of 100  or higher  Following upper endoscopy (EGD, EUS, ERCP, esophageal dilation) Vomiting of blood or coffee ground material  New, significant abdominal pain  New, significant chest pain or pain under the shoulder blades  Painful or persistently difficult swallowing  New shortness of breath  Black, tarry-looking or red, bloody stools  FOLLOW UP:  If any biopsies were taken you will be contacted by phone or by letter within the next 1-3 weeks. Call [phone]  if you have not heard about the biopsies in 3 weeks.  Please also call with any specific questions about appointments or follow up tests. YOU HAD AN ENDOSCOPIC PROCEDURE TODAY: Refer to the procedure report and other information in the discharge instructions given to you for any specific questions about what was found during the examination. If this information does not answer your questions, please call [facility] office at [phone] to clarify.   YOU SHOULD EXPECT: Some feelings of bloating in the abdomen. Passage of more gas than usual. Walking can help get rid of the air that was put into your GI tract during the procedure and reduce the bloating. If you had a lower endoscopy (such as a colonoscopy or flexible sigmoidoscopy) you may notice spotting of blood in your stool or on the toilet paper. Some abdominal soreness may be present for a day or two, also.  DIET: Full liquid diet today:  puddings, jello, yogurt, applesauce, things of that sort today.  Soft diet otherwise starting tomorrow.  Avoid large chunks of meats and vegetables.  ACTIVITY:  Your care partner should take you home directly after the procedure. You should plan to take it easy, moving slowly for the rest of the day. You can resume normal activity the day after the procedure however YOU SHOULD NOT DRIVE, use power tools, machinery or perform tasks that involve climbing or major physical exertion for 24 hours (because of the sedation medicines used during the test).   SYMPTOMS  TO REPORT IMMEDIATELY: A gastroenterologist can be reached at any hour. Please call [phone]  for any of the following symptoms:  Following lower endoscopy (colonoscopy, flexible sigmoidoscopy) Excessive amounts of blood in the stool  Significant tenderness, worsening of abdominal pains  Swelling of the abdomen that is new, acute  Fever of 100 or higher  Following upper endoscopy (EGD, EUS, ERCP, esophageal dilation) Vomiting of blood or coffee ground material  New, significant abdominal pain  New, significant chest pain or pain under the shoulder blades  Painful or persistently difficult swallowing  New shortness of breath  Black, tarry-looking or red, bloody stools  FOLLOW UP:  If any biopsies were taken you will be contacted by phone or by letter within the next 1-3 weeks. Call [phone]  if you have not heard about the biopsies in 3 weeks.  Please also call with any specific questions about appointments or follow up tests.YOU HAD AN ENDOSCOPIC PROCEDURE TODAY: Refer to the procedure report and other information in the discharge instructions given to you for any specific questions about what was found during the examination. If this information does not answer your questions, please call [facility] office at [phone] to clarify.   YOU SHOULD EXPECT: Some feelings of bloating in the abdomen. Passage of more gas than usual. Walking can help get rid of the air that was put into your GI tract during the procedure and reduce the bloating. If you had a lower endoscopy (such as a colonoscopy or flexible sigmoidoscopy) you may notice spotting of blood in your stool or on the toilet paper. Some abdominal soreness may be present for a day or two, also.  DIET: Your first meal following the procedure should be a light meal and then it is ok to progress to your normal diet. A half-sandwich or bowl of soup is an example of a good first meal. Heavy or fried foods are harder to digest and may make  you feel nauseous or bloated. Drink plenty of fluids but you should avoid alcoholic beverages for 24 hours. If you had a esophageal dilation, please see attached instructions for diet.    ACTIVITY: Your care partner should take you home directly after the procedure. You should plan to take it easy, moving slowly for the rest of the day. You can resume normal activity the day after the procedure however YOU SHOULD NOT DRIVE, use power tools, machinery or perform tasks that involve climbing or major physical exertion for 24 hours (because of the sedation medicines used during the test).   SYMPTOMS TO REPORT IMMEDIATELY: A gastroenterologist can be reached at any hour. Please call [phone]  for any of the following symptoms:  Following lower endoscopy (colonoscopy, flexible sigmoidoscopy) Excessive amounts of blood in the stool  Significant tenderness, worsening of abdominal pains  Swelling of the abdomen that is new, acute  Fever of 100 or higher  Following upper endoscopy (EGD, EUS, ERCP, esophageal dilation) Vomiting of blood or coffee ground material  New, significant abdominal pain  New, significant chest pain or pain under the shoulder blades  Painful or persistently difficult swallowing  New shortness of breath  Black, tarry-looking or red, bloody stools  FOLLOW UP:  If any biopsies were taken you will be contacted by phone or by letter within the next 1-3 weeks. Call [phone]  if you have not heard about the biopsies in 3 weeks.  Please also call with any specific questions about appointments or follow up tests. YOU HAD AN ENDOSCOPIC PROCEDURE TODAY: Refer to the procedure report and other information in the discharge instructions given to you for any specific questions about what was found during the examination. If this information does not answer your questions, please call [facility] at [phone] to clarify.   YOU SHOULD EXPECT: Some feelings of bloating in the  abdomen. Passage of more gas than usual. Walking can help get rid of the air that was put into your GI tract during the procedure and reduce the bloating. If you had a lower endoscopy (such as a colonoscopy or flexible sigmoidoscopy) you may notice spotting of blood in your stool or on the toilet paper. Some abdominal soreness may be present for a day or two, also.  DIET: Your first meal following the procedure should be a light meal and then it is ok to progress to your normal diet. A half-sandwich or bowl of soup is an example of a good first meal. Heavy or fried foods are harder to digest and may make you feel nauseous or bloated. Drink plenty of fluids but you should avoid alcoholic beverages for 24 hours. If you had a esophageal dilation, please see attached instructions for diet.    ACTIVITY: Your care partner should take you home directly after the procedure. You should plan to take it easy, moving slowly for the rest of the day. You can resume normal activity the day after the procedure however YOU SHOULD NOT DRIVE, use power tools, machinery or perform tasks that involve climbing or major physical exertion for 24 hours (because of the sedation medicines used during the test).   SYMPTOMS TO REPORT IMMEDIATELY: A gastroenterologist can be reached at any hour. Please call [phone]  for any of the following symptoms:  Following upper endoscopy (EGD, EUS, ERCP, esophageal dilation) Vomiting of blood or coffee ground material  New, significant abdominal pain  New, significant chest pain or pain under the shoulder blades  Painful or persistently difficult swallowing  New shortness of breath  Black, tarry-looking or red, bloody stools  FOLLOW UP:  If any biopsies were taken you will be contacted by phone or by letter within the next 1-3 weeks. Call [phone]  if you have not heard about the biopsies in 3 weeks.  Please also call with any specific questions about appointments or follow up  tests.

## 2022-06-05 NOTE — Anesthesia Preprocedure Evaluation (Addendum)
Anesthesia Evaluation  Patient identified by MRN, date of birth, ID band Patient awake    Reviewed: Allergy & Precautions, NPO status , Patient's Chart, lab work & pertinent test results  History of Anesthesia Complications (+) PONV and history of anesthetic complications  Airway Mallampati: III  TM Distance: >3 FB Neck ROM: Full    Dental no notable dental hx. (+) Teeth Intact, Dental Advisory Given   Pulmonary sleep apnea    Pulmonary exam normal        Cardiovascular hypertension, Pt. on medications Normal cardiovascular exam     Neuro/Psych  Headaches  Anxiety Depression    ADHD   GI/Hepatic Neg liver ROS,GERD  ,,Food impaction   Endo/Other  Hypothyroidism    Renal/GU negative Renal ROS  negative genitourinary   Musculoskeletal negative musculoskeletal ROS (+)    Abdominal   Peds  Hematology negative hematology ROS (+)   Anesthesia Other Findings Day of surgery medications reviewed with patient.  Reproductive/Obstetrics negative OB ROS                             Anesthesia Physical Anesthesia Plan  ASA: 2 and emergent  Anesthesia Plan: General   Post-op Pain Management: Minimal or no pain anticipated   Induction: Intravenous and Rapid sequence  PONV Risk Score and Plan: 4 or greater and Treatment may vary due to age or medical condition, Dexamethasone, Ondansetron and Propofol infusion  Airway Management Planned: Oral ETT and Video Laryngoscope Planned  Additional Equipment: None  Intra-op Plan:   Post-operative Plan: Extubation in OR  Informed Consent: I have reviewed the patients History and Physical, chart, labs and discussed the procedure including the risks, benefits and alternatives for the proposed anesthesia with the patient or authorized representative who has indicated his/her understanding and acceptance.     Dental advisory given  Plan Discussed with:  CRNA  Anesthesia Plan Comments:        Anesthesia Quick Evaluation

## 2022-06-05 NOTE — ED Triage Notes (Addendum)
Pt states that she has had hiatal hernias before. Pt feels like she cannot get down water/food. Pt has watery emesis in bag. Pt has had to get the food "pushed down" before and a week later, stretched. Pt speaking in full sentences in triage, can keep secretions down.   Pt also requests that we do not give her any BP meds, states she bottoms out her pressures and it takes a month for them to return to normal.

## 2022-06-05 NOTE — Op Note (Signed)
[facility] Patient Name: Diana Wolfe Procedure Date: 06/05/2022 MRN: [id number] Attending MD: Willis Modena , MD, [id number] Date of Birth: 08/28/1950 CSN: [id number] Age: 72 Admit Type: Inpatient Procedure:                Upper GI endoscopy Indications:              Foreign body in the esophagus Providers:                Willis Modena, MD, Vicki Mallet, RN, Joannie [place], Technician Referring MD:             Emergency Deparment Medicines:                General Anesthesia Complications:            No immediate complications. Estimated Blood Loss:     Estimated blood loss: none. Procedure:                Pre-Anesthesia Assessment:                           - Prior to the procedure, a History and Physical                            was performed, and patient medications and                            allergies were reviewed. The patient's tolerance of                            previous anesthesia was also reviewed. The risks                            and benefits of the procedure and the sedation                            options and risks were discussed with the patient.                            All questions were answered, and informed consent                            was obtained. Prior Anticoagulants: The patient has                            taken no anticoagulant or antiplatelet agents. ASA                            Grade Assessment: II - A patient with mild systemic                            disease. After reviewing the risks and benefits,  the patient was deemed in satisfactory condition to                            undergo the procedure.                           After obtaining informed consent, the endoscope was                            passed under direct vision. Throughout the                            procedure, the patient's blood pressure, pulse, and                             oxygen saturations were monitored continuously. The                            [id number]-[id number] ([id number]) Olympus endoscope was introduced                            through the mouth, and advanced to the second part                            of duodenum. The upper GI endoscopy was                            accomplished without difficulty. The patient                            tolerated the procedure well. Scope In: Scope Out: Findings:      Food was found in the lower third of the esophagus. Removal was       accomplished with a regular forceps, Raptor grasping device and Roth net.      LA Grade B (one or more mucosal breaks greater than 5 mm, not extending       between the tops of two mucosal folds) esophagitis was found distal       esophagus. Underlying stricture at GE junction suggested, but difficult       to assess due to underling edema/inflammation.      The exam of the esophagus was otherwise normal.      The entire examined stomach was normal.      The duodenal bulb, first portion of the duodenum and second portion of       the duodenum were normal. Impression:               - Food was found in the esophagus. Removal was                            successful.                           - LA Grade B erosive esophagitis.                           -  Normal stomach.                           - Normal duodenal bulb, first portion of the                            duodenum and second portion of the duodenum. Moderate Sedation:      Not Applicable - Patient had care per Anesthesia. Recommendation:           - Discharge patient to home (via wheelchair).                           - Full liquid diet today.                           - Continue present medications.                           - Needs omeprazole/Prilosec 20 mg po qd                           - Return to GI clinic with Dr. Ewing Schlein; may benefit                            from repeat EGD +/- dilatation in 2-3 weeks. Procedure  Code(s):        --- Professional ---                           [phone], Esophagogastroduodenoscopy, flexible,                            transoral; with removal of foreign body(s) Diagnosis Code(s):        --- Professional ---                           B28.413K, Food in esophagus causing other injury,                            initial encounter                           K20.80, Other esophagitis without bleeding                           T18.108A, Unspecified foreign body in esophagus                            causing other injury, initial encounter CPT copyright 2022 American Medical Association. All rights reserved. The codes documented in this report are preliminary and upon coder review may  be revised to meet current compliance requirements. Willis Modena, MD 06/05/2022 12:06:28 PM This report has been signed electronically. Number of Addenda: 0

## 2022-06-05 NOTE — Transfer of Care (Signed)
Immediate Anesthesia Transfer of Care Note  Patient: Diana Wolfe  Procedure(s) Performed: ESOPHAGOGASTRODUODENOSCOPY (EGD) FOREIGN BODY REMOVAL  Patient Location: Endoscopy Unit  Anesthesia Type:General  Level of Consciousness: awake, oriented, and patient cooperative  Airway & Oxygen Therapy: Patient Spontanous Breathing and Patient connected to face mask oxygen  Post-op Assessment: Report given to RN and Post -op Vital signs reviewed and stable  Post vital signs: Reviewed  Last Vitals:  Vitals Value Taken Time  BP 131/69 06/05/22 1207  Temp 36.4 C 06/05/22 1207  Pulse 68 06/05/22 1210  Resp 16 06/05/22 1210  SpO2 96 % 06/05/22 1210  Vitals shown include unvalidated device data.  Last Pain:  Vitals:   06/05/22 1207  TempSrc: Temporal  PainSc: 0-No pain         Complications: No notable events documented.

## 2022-06-05 NOTE — H&P (Signed)
[facility] Consultation Note  Referring Provider: Emergency Department Primary Care Physician:  Diana Cowboy, MD Primary Gastroenterologist:  Dr. Ewing Schlein  Reason for Consultation:  Suspected esophageal food impaction  HPI: Diana Wolfe is a 72 y.o. female history of hiatal hernia and dysphagia.  Had suspected esophageal food impaction 2017 and had endoscopy with Dr. Randa Evens; by time of procedure the food had passed; no reported esophageal stricture was seen.  At this point, since late last night eating pork chop and rice, she has not been able to swallow.  Unable to drink or eat anything without vomiting.  Has to spit up her secretions.  No abdominal pain or blood in stool.   Past Medical History:  Diagnosis Date   ADD (attention deficit disorder)    Anxiety    Depression    GERD (gastroesophageal reflux disease)    Hyperlipidemia    Hypertension    Hypothyroid    PONV (postoperative nausea and vomiting)    Vitamin D deficiency     Past Surgical History:  Procedure Laterality Date   BREAST BIOPSY Left 07/09/2019   CESAREAN SECTION     ESOPHAGOGASTRODUODENOSCOPY N/A 09/02/2015   Procedure: ESOPHAGOGASTRODUODENOSCOPY (EGD);  Surgeon: Carman Ching, MD;  Location: [facility] ENDOSCOPY;  Service: Endoscopy;  Laterality: N/A;   FOOT SURGERY Right    IM NAILING TIBIA Left 01/09/2001   Dr. Doristine Section, [facility]   TONSILLECTOMY  2002    Prior to Admission medications   Medication Sig Start Date End Date Taking? Authorizing Provider  ALPRAZolam (XANAX) 1 MG tablet TAKE ONE-HALF TO ONE TABLET BY MOUTH EVERY NIGHT AT BEDTIME IF NEEDED FOR SLEEP. LIMIT TO FIVE DAYS PER WEEK TO AVOID ADDICTION AND DEMENTIA 04/25/22   Raynelle Dick, NP  amphetamine-dextroamphetamine (ADDERALL) 30 MG tablet Take 1 tablet by mouth daily. Patient taking differently: Take 15 mg by mouth as needed. 04/29/22 04/29/23  Raynelle Dick, NP  aspirin 81 MG chewable tablet 3 days a week    [provider]  cetirizine (ZYRTEC) 10 MG tablet 1 tablet    [provider]  Cyanocobalamin (B-12) 1000 MCG TABS Take by mouth.    [provider]  levothyroxine (SYNTHROID) 75 MCG tablet Take  1.5  tablet  Daily  M-W-F and 1 tablet daily the other days on an empty stomach with only water for 30 minutes & no Antacid meds, Calcium or Magnesium for 4 hours & avoid Biotin 03/29/22   Raynelle Dick, NP  losartan (COZAAR) 50 MG tablet Take 1 tablet (50 mg total) by mouth daily. Patient not taking: Reported on 03/17/2022 02/18/22 02/18/23  Raynelle Dick, NP  meloxicam (MOBIC) 15 MG tablet TAKE 1/2 TO 1 TABLET BY MOUTH DAILY WITH FOOD FOR PAIN AND INFLAMMATION 02/09/22   Raynelle Dick, NP  Multiple Vitamin (MULTIVITAMIN) capsule Take 1 capsule by mouth daily.    [provider]  olmesartan (BENICAR) 40 MG tablet Take 1 tablet every Night  for BP Patient not taking: Reported on 06/02/2022 02/28/22   Diana Cowboy, MD  omeprazole (PRILOSEC) 40 MG capsule TAKE ONE CAPSULE BY MOUTH DAILY TO PREVENT HEARTBURN AND INDIGESTION Patient taking differently: 3 days per week 07/21/21   Raynelle Dick, NP  RESTASIS 0.05 % ophthalmic emulsion Place 1 drop into both eyes 2 (two) times daily. Patient not taking: Reported on 06/02/2022 04/05/22   [provider]  vitamin C (ASCORBIC ACID) 500 MG tablet Take 1,200 mg by mouth daily.  [provider]  Vitamin D, Ergocalciferol, (DRISDOL) 1.25 MG (50000 UNIT) CAPS capsule TAKE 1 CAPSULE BY MOUTH 3 TIMES A WEEK ON MONDAY- WEDNESDAY- FRIDAY FOR SEVERE VITAMIN D. DEFICIENCY 02/07/22   Raynelle Dick, NP    No current facility-administered medications for this encounter.    Allergies as of 06/05/2022 - Review Complete 06/05/2022  Allergen Reaction Noted   Levofloxacin     Buspirone  03/17/2022   Cefprozil     Codeine  04/25/2008   Hydrocodone-acetaminophen     Morphine     Nitrofuran derivatives Itching  08/31/2020   Pravastatin Swelling 03/27/2014   Lexapro [escitalopram oxalate] Other (See Comments) 03/09/2018    Family History  Problem Relation Age of Onset   Breast cancer Mother        64s   Cancer Mother 41       Breast   Heart disease Father    Hypertension Father    Breast cancer Cousin        twice 34, 51    Social History   Socioeconomic History   Marital status: Divorced    Spouse name: Not on file   Number of children: Not on file   Years of education: Not on file   Highest education level: Not on file  Occupational History   Not on file  Tobacco Use   Smoking status: Never   Smokeless tobacco: Never  Substance and Sexual Activity   Alcohol use: Yes    Comment: occasional   Drug use: No   Sexual activity: Not on file  Other Topics Concern   Not on file  Social History Narrative   Not on file   Social Determinants of Health   Financial Resource Strain: Low Risk  (11/11/2021)   Overall Financial Resource Strain (CARDIA)    Difficulty of Paying Living Expenses: Not hard at all  Food Insecurity: No Food Insecurity (11/11/2021)   Hunger Vital Sign    Worried About Running Out of Food in the Last Year: Never true    Ran Out of Food in the Last Year: Never true  Transportation Needs: No Transportation Needs (11/11/2021)   PRAPARE - Administrator, Civil Service (Medical): No    Lack of Transportation (Non-Medical): No  Physical Activity: Not on file  Stress: Not on file  Social Connections: Moderately Isolated (11/11/2021)   Social Connection and Isolation Panel [NHANES]    Frequency of Communication with Friends and Family: Twice a week    Frequency of Social Gatherings with Friends and Family: Once a week    Attends Religious Services: Never    Database administrator or Organizations: Yes    Attends Engineer, structural: More than 4 times per year    Marital Status: Divorced  Catering manager Violence: Not on file    Review of  Systems: As per HPI, all others negative  Physical Exam: Vital signs in last 24 hours: Temp:  [98.3 F (36.8 C)] 98.3 F (36.8 C) (04/14 0943) Pulse Rate:  [74] 74 (04/14 0943) Resp:  [17] 17 (04/14 0943) BP: (183)/(79) 183/79 (04/14 0943) SpO2:  [97 %] 97 % (04/14 0943) Weight:  [71.2 kg] 71.2 kg (04/14 0950)   General:   Alert,  Well-developed, well-nourished, pleasant and cooperative in NAD Head:  Normocephalic and atraumatic. Eyes:  Sclera clear, no icterus.   Conjunctiva pink. Ears:  Normal auditory acuity. Nose:  No deformity, discharge,  or lesions. Mouth:  No  deformity or lesions.  Oropharynx pink & moist. Neck:  Supple; no masses or thyromegaly. Lungs:  No respiratory distress Abdomen:  Soft, non-tender, non-distended, no peritonitis   Msk:  Symmetrical without gross deformities. Normal posture. Pulses:  Normal pulses noted. Extremities:  Without clubbing or edema. Neurologic:  Alert and  oriented x4;  grossly normal neurologically. Skin:  Intact without significant lesions or rashes. Psych:  Alert and cooperative. Normal mood and affect.   Lab Results: No results for input(s): "WBC", "HGB", "HCT", "PLT" in the last 72 hours. BMET No results for input(s): "NA", "K", "CL", "CO2", "GLUCOSE", "BUN", "CREATININE", "CALCIUM" in the last 72 hours. LFT No results for input(s): "PROT", "ALBUMIN", "AST", "ALT", "ALKPHOS", "BILITOT", "BILIDIR", "IBILI" in the last 72 hours. PT/INR No results for input(s): "LABPROT", "INR" in the last 72 hours.  Studies/Results: No results found.  Impression:   Suspected esophageal food impaction.  Plan:   Endoscopy for further evaluation, with possible food removal as needed. Risks (bleeding, infection, bowel perforation that could require surgery, sedation-related changes in cardiopulmonary systems), benefits (identification and possible treatment of source of symptoms, exclusion of certain causes of symptoms), and alternatives  (watchful waiting, radiographic imaging studies, empiric medical treatment) of upper endoscopy (EGD) were explained to patient/family in detail and patient wishes to proceed.    LOS: 0 days   Ziare Orrick M  06/05/2022, 10:59 AM  Cell [phone] If no answer or after 5 PM call [phone]

## 2022-06-05 NOTE — ED Provider Notes (Addendum)
EMERGENCY DEPARTMENT AT [facility] Provider Note   CSN: [id number] Arrival date & time: 06/05/22  4098     History  Chief Complaint  Patient presents with   Dysphagia    Diana Wolfe is a 72 y.o. female.  With PMH of HTN, HLD, GERD and hiatal hernias who presents with dysphagia.  She was supposed to have EGD performed today with Dr. Dulce Sellar.  Patient is unsure where she was supposed to come.  She has had previous endoscopies prior for food impactions and food bolus obstructions.  Is been ongoing since 9:30 PM last night after she had pork chops for dinner.  She has been spitting up clear secretions.  She has no abdominal pain, no chest pain, no shortness of breath, no fevers.  HPI     Home Medications Prior to Admission medications   Medication Sig Start Date End Date Taking? Authorizing Provider  ALPRAZolam (XANAX) 1 MG tablet TAKE ONE-HALF TO ONE TABLET BY MOUTH EVERY NIGHT AT BEDTIME IF NEEDED FOR SLEEP. LIMIT TO FIVE DAYS PER WEEK TO AVOID ADDICTION AND DEMENTIA 04/25/22   Raynelle Dick, NP  amphetamine-dextroamphetamine (ADDERALL) 30 MG tablet Take 1 tablet by mouth daily. Patient taking differently: Take 15 mg by mouth as needed. 04/29/22 04/29/23  Raynelle Dick, NP  aspirin 81 MG chewable tablet 3 days a week    [provider]  cetirizine (ZYRTEC) 10 MG tablet 1 tablet    [provider]  Cyanocobalamin (B-12) 1000 MCG TABS Take by mouth.    [provider]  levothyroxine (SYNTHROID) 75 MCG tablet Take  1.5  tablet  Daily  M-W-F and 1 tablet daily the other days on an empty stomach with only water for 30 minutes & no Antacid meds, Calcium or Magnesium for 4 hours & avoid Biotin 03/29/22   Raynelle Dick, NP  losartan (COZAAR) 50 MG tablet Take 1 tablet (50 mg total) by mouth daily. Patient not taking: Reported on 03/17/2022 02/18/22 02/18/23  Raynelle Dick, NP  meloxicam (MOBIC) 15 MG tablet TAKE 1/2 TO 1 TABLET BY  MOUTH DAILY WITH FOOD FOR PAIN AND INFLAMMATION 02/09/22   Raynelle Dick, NP  Multiple Vitamin (MULTIVITAMIN) capsule Take 1 capsule by mouth daily.    [provider]  olmesartan (BENICAR) 40 MG tablet Take 1 tablet every Night  for BP Patient not taking: Reported on 06/02/2022 02/28/22   Lucky Cowboy, MD  omeprazole (PRILOSEC) 40 MG capsule TAKE ONE CAPSULE BY MOUTH DAILY TO PREVENT HEARTBURN AND INDIGESTION Patient taking differently: 3 days per week 07/21/21   Raynelle Dick, NP  RESTASIS 0.05 % ophthalmic emulsion Place 1 drop into both eyes 2 (two) times daily. Patient not taking: Reported on 06/02/2022 04/05/22   [provider]  vitamin C (ASCORBIC ACID) 500 MG tablet Take 1,200 mg by mouth daily.    [provider]  Vitamin D, Ergocalciferol, (DRISDOL) 1.25 MG (50000 UNIT) CAPS capsule TAKE 1 CAPSULE BY MOUTH 3 TIMES A WEEK ON MONDAY- WEDNESDAY- FRIDAY FOR SEVERE VITAMIN D. DEFICIENCY 02/07/22   Raynelle Dick, NP      Allergies    Levofloxacin, Buspirone, Cefprozil, Codeine, Hydrocodone-acetaminophen, Morphine, Nitrofuran derivatives, Pravastatin, and Lexapro [escitalopram oxalate]    Review of Systems   Review of Systems  Physical Exam Updated Vital Signs BP (!) 183/79 (BP Location: Left Arm)   Pulse 74   Temp 98.3 F (36.8 C) (Oral)   Resp 17  Ht 5\' 2"  (1.575 m)   Wt 71.2 kg   SpO2 97%   BMI 28.72 kg/m  Physical Exam Constitutional: Alert and oriented.  Pleasant female no acute distress intermittently spitting up clear secretions no active vomiting Eyes: Conjunctivae are normal. ENT      Head: Normocephalic and atraumatic. Cardiovascular: Regular rate and rhythm Respiratory: Normal respiratory effort. Breath sounds are normal.  O2 sat 97 on RA Gastrointestinal: Soft and nontender.  Musculoskeletal: Normal range of motion in all extremities. Neurologic: Normal speech and language. No gross focal neurologic deficits are  appreciated. Skin: Skin is warm, dry and intact. No rash noted. Psychiatric: Mood and affect are normal. Speech and behavior are normal.  ED Results / Procedures / Treatments   Labs (all labs ordered are listed, but only abnormal results are displayed) Labs Reviewed - No data to display  EKG None  Radiology No results found.  Procedures Procedures   Medications Ordered in ED Medications - No data to display  ED Course/ Medical Decision Making/ A&P                             Medical Decision Making  Diana Wolfe is a 72 y.o. female.  With PMH of HTN, HLD, GERD and hiatal hernias who presents with dysphagia.  She was supposed to have EGD performed today with Dr. Dulce Sellar.  Patient has symptoms consistent with  esophageal obstruction due to food bolus.  Her airway is intact and she has no respiratory distress and clear breath sounds and no hypoxia.  Her abdominal exam is benign.  She is hypertensive but likely due to discomfort from active esophageal obstruction.  Additionally she has not taken any of her home antihypertensives.  I am not concerned for hypertensive emergency.  Patient taken to endoscopy suite for planned endoscopy for food bolus obstruction with Dr. Dulce Sellar, gastroenterology, and his team.  Amount and/or Complexity of Data Reviewed Radiology: ordered.      Final Clinical Impression(s) / ED Diagnoses Final diagnoses:  Esophageal obstruction due to food impaction    Rx / DC Orders ED Discharge Orders     None         Mardene Sayer, MD 06/05/22 1052    Mardene Sayer, MD 06/05/22 1054

## 2022-06-05 NOTE — Anesthesia Postprocedure Evaluation (Signed)
Anesthesia Post Note  Patient: Diana Wolfe  Procedure(s) Performed: ESOPHAGOGASTRODUODENOSCOPY (EGD) FOREIGN BODY REMOVAL     Patient location during evaluation: PACU Anesthesia Type: General Level of consciousness: awake and alert Pain management: pain level controlled Vital Signs Assessment: post-procedure vital signs reviewed and stable Respiratory status: spontaneous breathing, nonlabored ventilation and respiratory function stable Cardiovascular status: blood pressure returned to baseline Postop Assessment: no apparent nausea or vomiting Anesthetic complications: no   No notable events documented.  Last Vitals:  Vitals:   06/05/22 1210 06/05/22 1220  BP: (!) 116/23 123/74  Pulse: 68 (!) 42  Resp: 16 16  Temp:    SpO2: 96% 94%    Last Pain:  Vitals:   06/05/22 1207  TempSrc: Temporal  PainSc: 0-No pain                 Shanda Howells

## 2022-06-05 NOTE — Anesthesia Procedure Notes (Signed)
Procedure Name: Intubation Date/Time: 06/05/2022 11:19 AM  Performed by: Lovie Chol, CRNAPre-anesthesia Checklist: Patient identified, Emergency Drugs available, Suction available and Patient being monitored Patient Re-evaluated:Patient Re-evaluated prior to induction Oxygen Delivery Method: Circle System Utilized Preoxygenation: Pre-oxygenation with 100% oxygen Induction Type: IV induction, Rapid sequence and Cricoid Pressure applied Laryngoscope Size: Glidescope and 3 Grade View: Grade I Tube type: Oral Tube size: 7.0 mm Number of attempts: 1 Airway Equipment and Method: Stylet Placement Confirmation: ETT inserted through vocal cords under direct vision, positive ETCO2 and breath sounds checked- equal and bilateral Secured at: 21 cm Tube secured with: Tape Dental Injury: Teeth and Oropharynx as per pre-operative assessment

## 2022-06-06 ENCOUNTER — Encounter (HOSPITAL_COMMUNITY): Payer: Self-pay | Admitting: Gastroenterology

## 2022-06-06 SURGERY — ESOPHAGOGASTRODUODENOSCOPY (EGD) WITH PROPOFOL
Anesthesia: General

## 2022-06-07 ENCOUNTER — Ambulatory Visit (HOSPITAL_COMMUNITY): Payer: Medicare Other | Attending: Internal Medicine

## 2022-06-07 ENCOUNTER — Other Ambulatory Visit: Payer: Self-pay | Admitting: Nurse Practitioner

## 2022-06-07 DIAGNOSIS — I10 Essential (primary) hypertension: Secondary | ICD-10-CM | POA: Diagnosis not present

## 2022-06-07 DIAGNOSIS — R230 Cyanosis: Secondary | ICD-10-CM | POA: Diagnosis not present

## 2022-06-07 DIAGNOSIS — F988 Other specified behavioral and emotional disorders with onset usually occurring in childhood and adolescence: Secondary | ICD-10-CM

## 2022-06-07 LAB — ECHOCARDIOGRAM LIMITED BUBBLE STUDY
Area-P 1/2: 2.97 cm2
Est EF: 70
S' Lateral: 3.1 cm

## 2022-06-07 MED ORDER — AMPHETAMINE-DEXTROAMPHETAMINE 30 MG PO TABS
30.0000 mg | ORAL_TABLET | Freq: Every day | ORAL | 0 refills | Status: DC
Start: 2022-06-07 — End: 2022-07-27

## 2022-06-08 ENCOUNTER — Ambulatory Visit: Payer: Medicare Other | Attending: General Practice

## 2022-06-08 ENCOUNTER — Telehealth: Payer: Self-pay

## 2022-06-08 DIAGNOSIS — R002 Palpitations: Secondary | ICD-10-CM | POA: Diagnosis not present

## 2022-06-08 NOTE — Telephone Encounter (Signed)
        Patient  visited [place] on 4/14    Telephone encounter attempt :  1st  A HIPAA compliant voice message was left requesting a return call.  Instructed patient to call back    Arlynn Stare [facility], [place] [phone] [address], [place], [place] [address] Phone: [phone] Email: [email or web address]      

## 2022-06-08 NOTE — Telephone Encounter (Signed)
     Patient  visit on 4/14  at [place]   Have you been able to follow up with your primary care physician? Yes   The patient was or was not able to obtain any needed medicine or equipment. Yes   Are there diet recommendations that you are having difficulty following? Na   Patient expresses understanding of discharge instructions and education provided has no other needs at this time. Yes       Noa Constante [facility] Guide, [place] [phone] [address], [place], [place] [address] Phone: [phone] Email: [email or web address]    

## 2022-06-20 ENCOUNTER — Other Ambulatory Visit: Payer: Self-pay | Admitting: Gastroenterology

## 2022-06-20 ENCOUNTER — Ambulatory Visit
Admission: RE | Admit: 2022-06-20 | Discharge: 2022-06-20 | Disposition: A | Discharge status: Home / Self Care | Payer: Medicare Other | Source: Ambulatory Visit | Attending: Gastroenterology | Admitting: Gastroenterology

## 2022-06-20 DIAGNOSIS — R5382 Chronic fatigue, unspecified: Secondary | ICD-10-CM | POA: Diagnosis not present

## 2022-06-20 DIAGNOSIS — R079 Chest pain, unspecified: Secondary | ICD-10-CM

## 2022-06-20 DIAGNOSIS — R0789 Other chest pain: Secondary | ICD-10-CM | POA: Diagnosis not present

## 2022-06-20 DIAGNOSIS — R1310 Dysphagia, unspecified: Secondary | ICD-10-CM | POA: Diagnosis not present

## 2022-06-20 DIAGNOSIS — G8929 Other chronic pain: Secondary | ICD-10-CM | POA: Diagnosis not present

## 2022-06-25 LAB — AMB RESULTS CONSOLE CBG: Glucose: 98

## 2022-07-07 ENCOUNTER — Encounter (HOSPITAL_BASED_OUTPATIENT_CLINIC_OR_DEPARTMENT_OTHER): Payer: Self-pay | Admitting: Family

## 2022-07-07 ENCOUNTER — Ambulatory Visit (INDEPENDENT_AMBULATORY_CARE_PROVIDER_SITE_OTHER): Payer: Medicare Other | Admitting: Family

## 2022-07-07 VITALS — BP 131/71 | HR 88 | Ht 62.0 in | Wt 156.0 lb

## 2022-07-07 DIAGNOSIS — E785 Hyperlipidemia, unspecified: Secondary | ICD-10-CM

## 2022-07-07 DIAGNOSIS — I700 Atherosclerosis of aorta: Secondary | ICD-10-CM

## 2022-07-07 DIAGNOSIS — R002 Palpitations: Secondary | ICD-10-CM

## 2022-07-07 DIAGNOSIS — K551 Chronic vascular disorders of intestine: Secondary | ICD-10-CM

## 2022-07-07 DIAGNOSIS — I701 Atherosclerosis of renal artery: Secondary | ICD-10-CM | POA: Diagnosis not present

## 2022-07-07 DIAGNOSIS — R0989 Other specified symptoms and signs involving the circulatory and respiratory systems: Secondary | ICD-10-CM | POA: Diagnosis not present

## 2022-07-07 DIAGNOSIS — R030 Elevated blood-pressure reading, without diagnosis of hypertension: Secondary | ICD-10-CM | POA: Diagnosis not present

## 2022-07-07 DIAGNOSIS — G4733 Obstructive sleep apnea (adult) (pediatric): Secondary | ICD-10-CM | POA: Diagnosis not present

## 2022-07-07 MED ORDER — HYDRALAZINE HCL 10 MG PO TABS
ORAL_TABLET | ORAL | 1 refills | Status: AC
Start: 2022-07-07 — End: ?

## 2022-07-07 MED ORDER — HYDRALAZINE HCL 10 MG PO TABS: ORAL_TABLET | ORAL | 1 refills | Status: DC

## 2022-07-07 NOTE — Patient Instructions (Signed)
Medication Instructions:  Your physician has recommended you make the following change in your medication:  Start: Hydralazine 5mg -Take half tablet as needed for systolic blood pressure (top number) more than 190. If blood pressure remains more than 190 one hour after taking half tablet, may take additional half tablet  Follow-Up: As needed with [facility]   &   As scheduled with Edd Fabian, NP     Referrals:      Provider Referral Exercise Program (P.R.E.P.)      12 Weeks to Wellness       What is included in the PREP?  --Health Coaching and personalized exercise prescription --Full Membership to participating [facility] for the 12 weeks --Pre- and Post-consultations to assess progress and formulate an exercise plan for       continuation of exercise   What is my investment?  Your cost is $100 (reg. $144). This includes full membership privileges to [facility] in [place] and across the country. If you complete the post-assessment visit, any applicable enrollment costs will be waived if you decide to join the [facility].   Who will benefit from PREP?  People with challenges, including (but not limited to): ---Low back pain ---Arthritis ---Hypertension ---Diabetes ---Obesity ---Joint Replacement ---Neuromuscular Disorders ---Cancer Recovery ---Weight Loss ---Many others (as determined by provider)   Get Started Today! Ask your healthcare provider to fill out a Healthcare Provider Referral for Exercise form. Be sure to turn it in before you leave the office and send/fax/email it directly to the Wellness RN to schedule your Initial Consultation. If you have family or friends that would also benefit, please share this referral with them.   Contacts:  [facility] [phone]    Viann Fish, Wellness RN  [phone]  [email or web address]    Special Instructions:   Ezetimibe Tablets What is this medication? EZETIMIBE (ez ET i mibe) treats high cholesterol. It works  by reducing the amount of cholesterol absorbed from the food you eat. This decreases the amount of bad cholesterol (such as LDL) in your blood. Changes to diet and exercise are often combined with this medication. This medicine may be used for other purposes; ask your health care provider or pharmacist if you have questions. COMMON BRAND NAME(S): Zetia What should I tell my care team before I take this medication? They need to know if you have any of these conditions: Kidney disease Liver disease Muscle cramps, pain Muscle injury Thyroid disease An unusual or allergic reaction to ezetimibe, other medications, foods, dyes, or preservatives Pregnant or trying to get pregnant Breast-feeding How should I use this medication? Take this medication by mouth. Take it as directed on the prescription label at the same time every day. You can take it with or without food. If it upsets your stomach, take it with food. Keep taking it unless your care team tells you to stop. Take bile acid sequestrants at a different time of day than this medication. Take this medication 2 hours BEFORE or 4 hours AFTER bile acid sequestrants. Talk to your care team about the use of this medication in children. While it may be prescribed for children as young as 10 for selected conditions, precautions do apply. Overdosage: If you think you have taken too much of this medicine contact a poison control center or emergency room at once. NOTE: This medicine is only for you. Do not share this medicine with others. What if I miss a dose? If you miss a dose, take it as soon  as you can. If it is almost time for your next dose, take only that dose. Do not take double or extra doses. What may interact with this medication? Do not take this medication with any of the following: Fenofibrate Gemfibrozil This medication may also interact with the following: Antacids Cyclosporine Herbal medications like red yeast rice Other  medications to lower cholesterol or triglycerides This list may not describe all possible interactions. Give your health care provider a list of all the medicines, herbs, non-prescription drugs, or dietary supplements you use. Also tell them if you smoke, drink alcohol, or use illegal drugs. Some items may interact with your medicine. What should I watch for while using this medication? Visit your care team for regular checks on your progress. Tell your care team if your symptoms do not start to get better or if they get worse. Your care team may tell you to stop taking this medication if you develop muscle problems. If your muscle problems do not go away after stopping this medication, contact your care team. Do not become pregnant while taking this medication. Women should inform their care team if they wish to become pregnant or think they might be pregnant. There is potential for serious harm to an unborn child. Talk to your care team for more information. Do not breast-feed an infant while taking this medication. Taking this medication is only part of a total heart healthy program. Your care team may give you a special diet to follow. Avoid alcohol. Avoid smoking. Ask your care team how much you should exercise. What side effects may I notice from receiving this medication? Side effects that you should report to your doctor or health care provider as soon as possible: Allergic reactions--skin rash, itching or hives, swelling of the face, lips, tongue, or throat Side effects that usually do not require medical attention (report to your doctor or health care provider if they continue or are bothersome): Diarrhea Joint pain This list may not describe all possible side effects. Call your doctor for medical advice about side effects. You may report side effects to FDA at 1-800-FDA-1088. Where should I keep my medication? Keep out of the reach of children and pets. Store at room temperature between 15  and 30 degrees C (59 and 86 degrees F). Protect from moisture. Get rid of any unused medication after the expiration date. NOTE: This sheet is a summary. It may not cover all possible information. If you have questions about this medicine, talk to your doctor, pharmacist, or health care provider.  2023 Elsevier/Gold Standard (2020-02-12 00:00:00)

## 2022-07-07 NOTE — Progress Notes (Signed)
Advanced Hypertension Clinic Initial Assessment:    Date:  07/07/2022   ID:  Diana Wolfe, DOB September 08, 1950, MRN [id number]  PCP:  Lucky Cowboy, MD  Cardiologist:  Parke Poisson, MD  Nephrologist:  Referring MD: Parke Poisson, MD   CC: Hypertension  History of Present Illness:    Diana Wolfe is a 72 y.o. female with a hx of aortic atherosclerosis, hypertension, hiatal hernia, hyperlipidemia, hypothyroidism, GERD, anxiety, OSA (dental device initiated 01/2022) here to establish care in the Advanced Hypertension Clinic.   Referred by her primary care provider to Dr. Jacques Navy 03/17/22 due to labile blood pressure.  Diana Wolfe had self discontinued all antianxiety and antihypertensive medications. Noted that upon taking antianxiety medications her BP escalated but then Diana Wolfe would have symptomatic hypotension and bradycardia with antihypertensive medications.   At visit 05/13/22 with Dr. Jacques Navy Amlodipine and Bisoprolol were discontinued. Wrist cuff from home found to read 13 points higher on systolic and be accurate on diastolic readings.   Prior CT abdomen 10/2020 with contrast with normal adrenals (no evidence of pheochromocytoma). 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  Echo 04/11/2022 normal LVEF 60 to 65%, no RWMA, mild LVH.  Limited bubble study 06/07/2022 with no shunt on echo to explain peripheral cyanosis.  Monitor 06/17/2022 worn for 6 days with preliminary report revealing predominantly normal sinus rhythm with no significant arrhythmia.  Diana Wolfe was diagnosed with hypertension 12 years ago and was treated while her mother was in her declining years and Diana Wolfe attributed blood pressure to stress. Thinks the prior medication might have been Losartan. Diana Wolfe was able to stop for many years and then October 2023 started to have elevated blood pressures. Diana Wolfe attributes  her elevated blood pressure readings to anxiety medications. It has been difficult to control with very labile readings. Blood pressure checked with wrist cuff at home. Readings have been 131/71 (which corrected reading 118/71) over the last week. Diana Wolfe reports tobacco use never. For exercise Diana Wolfe works Music therapist. Diana Wolfe eats at home and outside of the home and does follow low sodium diet.   Tells me "I feel bad all the time". Notes sensation Diana Wolfe cannot get a deep breath. Reassured by unremarkable cardiac workup. Has esophageal dilation upcoming. Encouraged to discuss possible PFT or referral to pulmonology if dyspnea persists despite esophageal dilation.  Reports difficulty with word finding with seemingly simple words such as her mediations. This has been ongoing and encouraged to discuss with her PCP.   Previous cholesterol medications Pravastatin - swelling, shortness of breath per her report  Previous antihypertensives: Olmesartan 20mg  and 40mg  - hypotension Bisoprolol 5mg  - hypotension, bradycardia Amlodipine 2.5 mg - memory issues when medication caused hypotension per her report  Past Medical History:  Diagnosis Date   ADD (attention deficit disorder)    Anxiety    Depression    GERD (gastroesophageal reflux disease)    Hyperlipidemia    Hypertension    Hypothyroid    PONV (postoperative nausea and vomiting)    Vitamin D deficiency     Past Surgical History:  Procedure Laterality Date   BREAST BIOPSY Left 07/09/2019   CESAREAN SECTION     ESOPHAGOGASTRODUODENOSCOPY N/A 09/02/2015   Procedure: ESOPHAGOGASTRODUODENOSCOPY (EGD);  Surgeon: Carman Ching, MD;  Location: [facility] ENDOSCOPY;  Service: Endoscopy;  Laterality: N/A;   ESOPHAGOGASTRODUODENOSCOPY N/A 06/05/2022   Procedure: ESOPHAGOGASTRODUODENOSCOPY (EGD);  Surgeon: Dulce Sellar,  Chrissie Noa, MD;  Location: [facility] ENDOSCOPY;  Service: Gastroenterology;  Laterality: N/A;   FOOT SURGERY Right    FOREIGN BODY REMOVAL N/A  06/05/2022   Procedure: FOREIGN BODY REMOVAL;  Surgeon: Willis Modena, MD;  Location: [facility] ENDOSCOPY;  Service: Gastroenterology;  Laterality: N/A;   IM NAILING TIBIA Left 01/09/2001   Dr. Doristine Section, [place]   TONSILLECTOMY  2002    Current Medications: Current Meds  Medication Sig   amphetamine-dextroamphetamine (ADDERALL) 30 MG tablet Take 1 tablet by mouth daily. (Patient taking differently: Take 15 mg by mouth daily.)   aspirin 81 MG chewable tablet 3 days a week   cetirizine (ZYRTEC) 10 MG tablet 1 tablet   Cyanocobalamin (B-12) 1000 MCG TABS Take by mouth.   levothyroxine (SYNTHROID) 75 MCG tablet Take  1.5  tablet  Daily  M-W-F and 1 tablet daily the other days on an empty stomach with only water for 30 minutes & no Antacid meds, Calcium or Magnesium for 4 hours & avoid Biotin   meloxicam (MOBIC) 15 MG tablet TAKE 1/2 TO 1 TABLET BY MOUTH DAILY WITH FOOD FOR PAIN AND INFLAMMATION (Patient taking differently: TAKE 1/2 TO 1 TABLET BY MOUTH DAILY WITH FOOD FOR PAIN AND INFLAMMATION as needed)   Multiple Vitamin (MULTIVITAMIN) capsule Take 1 capsule by mouth daily.   omeprazole (PRILOSEC) 40 MG capsule Take 1 capsule (40 mg total) by mouth daily. TAKE ONE CAPSULE BY MOUTH DAILY TO PREVENT HEARTBURN AND INDIGESTION   vitamin C (ASCORBIC ACID) 500 MG tablet Take 1,200 mg by mouth daily.   Vitamin D, Ergocalciferol, (DRISDOL) 1.25 MG (50000 UNIT) CAPS capsule TAKE 1 CAPSULE BY MOUTH 3 TIMES A WEEK ON MONDAY- WEDNESDAY- FRIDAY FOR SEVERE VITAMIN D. DEFICIENCY   [DISCONTINUED] hydrALAZINE (APRESOLINE) 10 MG tablet Take half tablet as needed for systolic blood pressure (top number) more than 190. If blood pressure remains more than 190 one hour after taking half tablet, may take additional half tablet.     Allergies:   Levofloxacin, Buspirone, Cefprozil, Codeine, Hydrocodone-acetaminophen, Morphine, Nitrofuran derivatives, Pravastatin, and Lexapro [escitalopram oxalate]   Social History    Socioeconomic History   Marital status: Divorced    Spouse name: Not on file   Number of children: Not on file   Years of education: Not on file   Highest education level: Not on file  Occupational History   Not on file  Tobacco Use   Smoking status: Never   Smokeless tobacco: Never  Substance and Sexual Activity   Alcohol use: Yes    Comment: occasional   Drug use: No   Sexual activity: Not on file  Other Topics Concern   Not on file  Social History Narrative   Not on file   Social Determinants of Health   Financial Resource Strain: Low Risk  (11/11/2021)   Overall Financial Resource Strain (CARDIA)    Difficulty of Paying Living Expenses: Not hard at all  Food Insecurity: No Food Insecurity (06/28/2022)   Hunger Vital Sign    Worried About Running Out of Food in the Last Year: Never true    Ran Out of Food in the Last Year: Never true  Transportation Needs: No Transportation Needs (06/28/2022)   PRAPARE - Administrator, Civil Service (Medical): No    Lack of Transportation (Non-Medical): No  Physical Activity: Not on file  Stress: Not on file  Social Connections: Moderately Isolated (11/11/2021)   Social Connection and Isolation Panel [NHANES]  Frequency of Communication with Friends and Family: Twice a week    Frequency of Social Gatherings with Friends and Family: Once a week    Attends Religious Services: Never    Database administrator or Organizations: Yes    Attends Engineer, structural: More than 4 times per year    Marital Status: Divorced     Family History: The patient's family history includes Breast cancer in her cousin and mother; Cancer (age of onset: 65) in her mother; Heart disease in her father; Hypertension in her father.  ROS:   Please see the history of present illness.     All other systems reviewed and are negative.  EKGs/Labs/Other Studies Reviewed:    EKG:  EKG is not ordered today.   Echo 04/11/22:  1. Left  ventricular ejection fraction, by estimation, is 60 to 65%. The  left ventricle has normal function. The left ventricle has no regional  wall motion abnormalities. There is mild concentric left ventricular  hypertrophy. Left ventricular diastolic  parameters are indeterminate.   2. Right ventricular systolic function is normal. The right ventricular  size is normal. There is normal pulmonary artery systolic pressure. The  estimated right ventricular systolic pressure is 28.0 mmHg.   3. The mitral valve is normal in structure. No evidence of mitral valve  regurgitation. No evidence of mitral stenosis.   4. The aortic valve is tricuspid. Aortic valve regurgitation is not  visualized. Aortic valve sclerosis/calcification is present, without any  evidence of aortic stenosis.   5. The inferior vena cava is normal in size with greater than 50%  respiratory variability, suggesting right atrial pressure of 3 mmHg.    Vascular renal artery bilateral US 03/31/22: Summary:  Largest Aortic Diameter: 2.5 cm  Renal:  Right: Normal size right kidney. Abnormal right Resistive Index.         Abnormal cortical thickness of right kidney. 1-59% stenosis         of the right renal artery. RRV flow present.  Left:  Normal size of left kidney. Normal left Resistive Index.         Normal cortical thickness of the left kidney. 1-59% stenosis         of the left renal artery. LRV flow present.  Mesenteric:  70 to 99% stenosis in the celiac artery and superior mesenteric artery.  Patent IVC.    NM Stress test with Lexiscan 03/25/22:   The study is normal. The study is low risk.   No ST deviation was noted.   LV perfusion is normal. There is no evidence of ischemia. There is no evidence of infarction.   Left ventricular function is normal. Nuclear stress EF: 69 %. The left ventricular ejection fraction is hyperdynamic (>65%). End diastolic cavity size is normal. End systolic cavity size is normal. No evidence of  transient ischemic dilation (TID) noted.   Prior study not available for comparison.   10/27/2020  CT Abdomen/Pelvis: Adrenals/Urinary Tract: Adrenal glands are unremarkable. Kidneys are normal, without renal calculi, solid lesion, or hydronephrosis. Bladder is unremarkable. IMPRESSION: 1. No CT findings of the abdomen or pelvis to explain right lower quadrant abdominal pain. 2. Normal appendix. Aortic Atherosclerosis (ICD10-I70.0).  Recent Labs: 02/28/2022: ALT 25; BUN 11; Creat 0.96; Hemoglobin 15.3; Magnesium 2.2; Platelets 398; Potassium 4.1; Sodium 142 03/28/2022: TSH 0.70   Recent Lipid Panel    Component Value Date/Time   CHOL 181 10/15/2021 1155   TRIG 181 (H) 10/15/2021 1155  HDL 53 10/15/2021 1155   CHOLHDL 3.4 10/15/2021 1155   VLDL 39 (H) 06/23/2016 1044   LDLCALC 100 (H) 10/15/2021 1155   LDLDIRECT 138.2 04/25/2008 1514    Physical Exam:   VS:  BP 131/71   Pulse 88   Ht 5\' 2"  (1.575 m)   Wt 156 lb (70.8 kg)   BMI 28.53 kg/m  , BMI Body mass index is 28.53 kg/m. GENERAL:  Well appearing HEENT: Pupils equal round and reactive, fundi not visualized, oral mucosa unremarkable NECK:  No jugular venous distention, waveform within normal limits, carotid upstroke brisk and symmetric, no bruits, no thyromegaly LYMPHATICS:  No cervical adenopathy LUNGS:  Clear to auscultation bilaterally HEART:  RRR.  PMI not displaced or sustained,S1 and S2 within normal limits, no S3, no S4, no clicks, no rubs, no murmurs ABD:  Flat, positive bowel sounds normal in frequency in pitch, no bruits, no rebound, no guarding, no midline pulsatile mass, no hepatomegaly, no splenomegaly EXT:  2 plus pulses throughout, no edema, no cyanosis no clubbing SKIN:  No rashes no nodules NEURO:  Cranial nerves II through XII grossly intact, motor grossly intact throughout PSYCH:  Cognitively intact, oriented to person place and time   ASSESSMENT/PLAN:    Labile blood pressure / White coat  hypertension- BP in clinic initially 168/81 (left arm) and 165/79 (right arm). Low suspicion aortic coarctation as <10 points difference. Repeat BP without intervention 152/80. Home BP this AM 131/71 which after correction (cuff found to read +13 systolic) reveals BP 118/71. BP at home on wrist cuff persistently 120s-130s/70s without correction indicating home readings 110s-120s/70s after correction. Diana Wolfe notes stress often triggers hypertensive episodes and has previously had higher pressures on antianxiety agents (not presently taking any). BP in clinic significantly higher than home readings on verified home cuff which confirms white coat hypertension.  If Diana Wolfe notes her BP is uncontrolled when checked 3x per week at home consider 24h BP monitor. Diana Wolfe politely declines today. Rx Hydralazine 5mg  PRN for SBP >190 (when checked twice at least 20 minutes apart).  Refer to PREP exercise program at [facility].  Heart healthy diet and regular cardiovascular exercise encouraged.   Secondary hypertension workup as detailed below. Low suspicion for hyperaldosteronism as potassium level has been normal, BP controlled at home but if recurrent labile hypertension could consider plasma renin-aldosterone.  Palpitations -Monitor 4/26-24 preliminary report with predominantly normal sinus rhythm and no significant rhythms.  Prior elevated heart rate likely related to anxiety.  Diana Wolfe does endorse not hydrating well and encouraged increased hydration, managing stressors well.  Aortic atherosclerosis / HLD, LDL goal <70 - Prior intolerance to Pravstatin with swelling, shortness of breath. Myoview 03/25/22 low risk study with no ischemia.  We discussed possible addition of Zetia but Diana Wolfe is understandably hesitant regarding medications given her multiple prior intolerances.  Diana Wolfe will review the information and can continue to discuss it further follow-ups.  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  Consider repeat duplex in 1 to 2 years for monitoring.  Diana Wolfe is aware to report abdominal pain after eating but is presently asymptomatic.  Lipid management, as above.   Anxiety - unable to tolerate buspirone. Per her report has not tolerated multiple anxiety medications.  Follows with primary care provider. Palpitations likely r/t anxiety as preliminary 6 day monitor report with NSR and no significant  arrhythmias.   OSA - Compliant with dental device.  Hypothyroidism - Continue to follow with PCP.    Screening for Secondary Hypertension:     07/07/2022    4:44 PM 07/07/2022    4:45 PM  Causes  Drugs/Herbals  Screened     - Comments  No OTC agents  Renovascular HTN Screened      - Comments Renal duplex 03/2022 bilateral 1-59% renal artery stenosis   Sleep Apnea  Screened     - Comments  Manged with dental device  Thyroid Disease Screened      - Comments Hypothyroidism appropriately managed by PCP   Hyperaldosteronism Not Screened      - Comments  Not screened as BP controlled. If BP uncontrolled in future, consider plasma renin aldosterone  Pheochromocytoma Screened      - Comments CT 10/2020 normal adrenals without pheochromocytoma   Cushing's Syndrome N/A      - Comments Non cushingoid appearance   Coarctation of the Aorta N/A      - Comments BP symmetrical     Relevant Labs/Studies:    Latest Ref Rng & Units 02/28/2022    5:02 PM 10/15/2021   11:55 AM 03/24/2021    3:42 PM  Basic Labs  Sodium 135 - 146 mmol/L 142  141  140   Potassium 3.5 - 5.3 mmol/L 4.1  4.3  4.5   Creatinine 0.50 - 1.05 mg/dL 1.61  0.96  0.45        Latest Ref Rng & Units 03/28/2022    4:14 PM 02/28/2022    5:02 PM  Thyroid   TSH 0.40 - 4.50 mIU/L 0.70  5.60              Latest Ref Rng & Units 11/26/2019    4:55 PM  Cortisol  Cortisol  mcg/dL 4.2        4/0/9811    9:58 AM   Renovascular   Renal Artery [place] Completed Yes       Diana Wolfe  interested in enrolling in the PREP exercise and nutrition program through the [facility].     Disposition:    FU with MD/PharmD in Hypertension Clinic as-needed.  Continue to follow with Dr. Jacques Navy and Edd Fabian, NP as scheduled.   Medication Adjustments/Labs and Tests Ordered: Current medicines are reviewed at length with the patient today.  Concerns regarding medicines are outlined above.  Orders Placed This Encounter  Procedures   Amb Referral To Provider Referral Exercise Program (P.R.E.P)   Meds ordered this encounter  Medications   DISCONTD: hydrALAZINE (APRESOLINE) 10 MG tablet    Sig: Take half tablet as needed for systolic blood pressure (top number) more than 190. If blood pressure remains more than 190 one hour after taking half tablet, may take additional half tablet.    Dispense:  30 tablet    Refill:  1    Order Specific Question:   Supervising Provider    Answer:   Jodelle Red [[id number]]   hydrALAZINE (APRESOLINE) 10 MG tablet    Sig: Take half tablet as needed for systolic blood pressure (top number) more than 190. If blood pressure remains more than 190 one hour after taking half tablet, may take additional half tablet.    Dispense:  30 tablet    Refill:  1    Order Specific Question:   Supervising Provider    Answer:   Jodelle Red [[id number]]     Signed, Alver Sorrow, NP  07/07/2022 4:47 PM    [facility]

## 2022-07-08 ENCOUNTER — Telehealth: Payer: Self-pay

## 2022-07-08 NOTE — Telephone Encounter (Signed)
Called re: PREP program referral; left voicemail requesting return call

## 2022-07-12 ENCOUNTER — Telehealth: Payer: Self-pay

## 2022-07-12 NOTE — Telephone Encounter (Signed)
She returned my call, would like to attend June 18 class at [facility], every T/Th 10-11:15; will contact first week of June to confirm and set up assessment visit.

## 2022-07-14 DIAGNOSIS — R1310 Dysphagia, unspecified: Secondary | ICD-10-CM | POA: Diagnosis not present

## 2022-07-14 DIAGNOSIS — K222 Esophageal obstruction: Secondary | ICD-10-CM | POA: Diagnosis not present

## 2022-07-14 DIAGNOSIS — K2950 Unspecified chronic gastritis without bleeding: Secondary | ICD-10-CM | POA: Diagnosis not present

## 2022-07-15 ENCOUNTER — Encounter: Payer: Self-pay | Admitting: *Deleted

## 2022-07-15 NOTE — Progress Notes (Signed)
PT. Screened at Health Equity screening event on 06/25/2022/Blood Sugar 98/screening levels wnl. At event, pt confirmed PCP is Dr Oneta Rack and  per chart review, last pt appt with Dr Oneta Rack was 02/28/22. At event pt did not identify any SDOH barriers, No further SDOH/health equity asst. is needed from the H/Eq. Team at this time.

## 2022-07-19 NOTE — Progress Notes (Unsigned)
Cardiology Clinic Note   Patient Name: Diana Wolfe Date of Encounter: 07/20/2022  Primary Care Provider:  Lucky Cowboy, MD Primary Cardiologist:  Parke Poisson, MD  Patient Profile    Diana Wolfe 72 year old female presents the clinic today for follow-up evaluation of her hypertension and hyperlipidemia.  Past Medical History    Past Medical History:  Diagnosis Date   ADD (attention deficit disorder)    Anxiety    Depression    GERD (gastroesophageal reflux disease)    Hyperlipidemia    Hypertension    Hypothyroid    PONV (postoperative nausea and vomiting)    Vitamin D deficiency    Past Surgical History:  Procedure Laterality Date   BREAST BIOPSY Left 07/09/2019   CESAREAN SECTION     ESOPHAGOGASTRODUODENOSCOPY N/A 09/02/2015   Procedure: ESOPHAGOGASTRODUODENOSCOPY (EGD);  Surgeon: Carman Ching, MD;  Location: [facility] ENDOSCOPY;  Service: Endoscopy;  Laterality: N/A;   ESOPHAGOGASTRODUODENOSCOPY N/A 06/05/2022   Procedure: ESOPHAGOGASTRODUODENOSCOPY (EGD);  Surgeon: Willis Modena, MD;  Location: [facility] ENDOSCOPY;  Service: Gastroenterology;  Laterality: N/A;   FOOT SURGERY Right    FOREIGN BODY REMOVAL N/A 06/05/2022   Procedure: FOREIGN BODY REMOVAL;  Surgeon: Willis Modena, MD;  Location: [facility] ENDOSCOPY;  Service: Gastroenterology;  Laterality: N/A;   IM NAILING TIBIA Left 01/09/2001   Dr. Doristine Section, [place]   TONSILLECTOMY  2002    Allergies  Allergies  Allergen Reactions   Levofloxacin     Neck sweling/difficult breathing   Buspirone    Cefprozil    Codeine     REACTION: vomiting   Hydrocodone-Acetaminophen    Morphine    Nitrofuran Derivatives Itching   Pravastatin Swelling   Lexapro [Escitalopram Oxalate] Other (See Comments)    Tapping/abnormal movements    History of Present Illness    Diana Wolfe has a PMH of aortic atherosclerosis, HTN, HLD, hypothyroidism, GERD, ADD, and labile blood pressure.  She was seen by  Dr. Jacques Navy as a consult for labile blood pressure 03/17/2022.  She felt that her anxiety medication had a side effect of increasing her blood pressures.  She noted that her blood pressures were elevated in the 150-160 systolic range.  During her initial BP spike her blood pressure was in the 180s over 120s.  She noted chest tightness and shortness of breath.  She was diagnosed with UTI the same day.  She noted that with low blood pressure and heart rate she felt significant fatigue.  At that time she stopped all of her antianxiety medications and antihypertensive medications.  She reported being off of blood pressure medications since 03/06/2022.  Her last dose of buspirone was reported 1/19.  Her blood pressure log showed blood pressures in the 100 teens-160s over 58-78.  She also noted struggling with management of her thyroid for about 9 months.  She had stopped her thyroid medicine, had resumed it and titrated up to 75 mcg daily.  She was seen in follow-up by Wallis Bamberg and was started on amlodipine 2.5 mg daily  She was seen in follow-up by Dr. Jacques Navy on 05/13/2022.  She had been on amlodipine x 1 week.  She had taken the medication 4-5 days without significant blood pressure improvement.  She reported significant stressors.  Since she did not notice improvement she had discontinued amlodipine.  She had started taking bisoprolol 5 mg which she had taken in the past.  Within a few days she noted that she was feeling poorly and  reported blood pressures in the 110-98 systolic over 62-41 diastolic.  Due to her symptoms and low blood pressures she has stopped taking bisoprolol day before her follow-up appointment.  She also reported that she has started using a dental device 12/23 for her OSA.  She indicated that she had started clenching her teeth which resulted in frontal headaches.  Tylenol was recommended.  She was referred to the advanced hypertension clinic.  Echocardiogram was ordered.  She  presented to the clinic 06/02/22 for follow-up evaluation and stated she was feeling somewhat better since being off of blood pressure medication.  She brought in a blood pressure log which showed blood pressures in the 120s over 60s-70s.  She did have intermittent elevated blood pressures in the 140-150 systolic range.  We reviewed her previous antihypertensive medication.  She reported decreased heart rate into the 30s.  She did note significant anxiety and tried to use mindfulness stress reduction.  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  Her TSH had been checked and was normal.  She noted that 3 days prior she had elevated heart rate.  Her Apple Watch was reviewed and noted heart rates in the 120 range.  Her cardiac event monitor showed rare ectopy and no patient triggered events.  She was seen in follow-up by Gillian Shields, NP on 07/07/2022.  During that time she reported difficulty with finding words such as her medications.  This has been ongoing and she was encouraged to follow-up with her PCP.  Her blood pressure was noted to be 168/80 and 165/79.  She was confirmed to have whitecoat hypertension.  She was prescribed hydralazine 5 mg as needed for systolic blood pressure greater than 190.  She was referred for [facility] prep exercise program.  She presents to the clinic today for follow-up evaluation and states she has had increased stress today related to her phone.  She spent a good portion of the day at the [employer].  We reviewed her cardiac event monitor and recent visit for blood pressure.  She expressed understanding.  She also had her esophagus dilated a week ago.  Since that time she has had much less chest tightness.  Her blood pressure continues to be somewhat labile at home and in the clinic today it is initially 154/60 and on recheck 142/74.  She has not had to use her hydralazine at this time.  We will have her continue to monitor,  start the [facility] prep program, and plan follow-up for 6 months.  Today she denies chest pain and shortness of breath.  She denies bleeding issues.  Home Medications    Prior to Admission medications   Medication Sig Start Date End Date Taking? Authorizing Provider  ALPRAZolam (XANAX) 1 MG tablet TAKE ONE-HALF TO ONE TABLET BY MOUTH EVERY NIGHT AT BEDTIME IF NEEDED FOR SLEEP. LIMIT TO FIVE DAYS PER WEEK TO AVOID ADDICTION AND DEMENTIA 04/25/22   Raynelle Dick, NP  amphetamine-dextroamphetamine (ADDERALL) 30 MG tablet Take 1 tablet by mouth daily. Patient taking differently: Take 15 mg by mouth as needed. 04/29/22 04/29/23  Raynelle Dick, NP  aspirin 81 MG chewable tablet 3 days a week    [provider]  cetirizine (ZYRTEC) 10 MG tablet 1 tablet    [provider]  Cyanocobalamin (B-12) 1000 MCG TABS Take by mouth.    [provider]  levothyroxine (SYNTHROID) 75 MCG tablet Take  1.5  tablet  Daily  M-W-F and 1 tablet daily the other days on an empty stomach with only water for 30 minutes & no Antacid meds, Calcium or Magnesium for 4 hours & avoid Biotin 03/29/22   Raynelle Dick, NP  losartan (COZAAR) 50 MG tablet Take 1 tablet (50 mg total) by mouth daily. Patient not taking: Reported on 03/17/2022 02/18/22 02/18/23  Raynelle Dick, NP  meloxicam (MOBIC) 15 MG tablet TAKE 1/2 TO 1 TABLET BY MOUTH DAILY WITH FOOD FOR PAIN AND INFLAMMATION 02/09/22   Raynelle Dick, NP  Multiple Vitamin (MULTIVITAMIN) capsule Take 1 capsule by mouth daily.    [provider]  olmesartan (BENICAR) 40 MG tablet Take 1 tablet every Night  for BP 02/28/22   Lucky Cowboy, MD  omeprazole (PRILOSEC) 40 MG capsule TAKE ONE CAPSULE BY MOUTH DAILY TO PREVENT HEARTBURN AND INDIGESTION Patient taking differently: 3 days per week 07/21/21   Raynelle Dick, NP  RESTASIS 0.05 % ophthalmic emulsion Place 1 drop into both eyes 2 (two) times daily. 04/05/22   [provider]   vitamin C (ASCORBIC ACID) 500 MG tablet Take 1,200 mg by mouth daily.    [provider]  Vitamin D, Ergocalciferol, (DRISDOL) 1.25 MG (50000 UNIT) CAPS capsule TAKE 1 CAPSULE BY MOUTH 3 TIMES A WEEK ON MONDAY- WEDNESDAY- FRIDAY FOR SEVERE VITAMIN D. DEFICIENCY 02/07/22   Raynelle Dick, NP    Family History    Family History  Problem Relation Age of Onset   Breast cancer Mother        66s   Cancer Mother 43       Breast   Heart disease Father    Hypertension Father    Breast cancer Cousin        twice 52, 62   She indicated that the status of her mother is unknown. She indicated that the status of her father is unknown. She indicated that the status of her cousin is unknown.  Social History    Social History   Socioeconomic History   Marital status: Divorced    Spouse name: Not on file   Number of children: Not on file   Years of education: Not on file   Highest education level: Not on file  Occupational History   Not on file  Tobacco Use   Smoking status: Never   Smokeless tobacco: Never  Substance and Sexual Activity   Alcohol use: Yes    Comment: occasional   Drug use: No   Sexual activity: Not on file  Other Topics Concern   Not on file  Social History Narrative   Not on file   Social Determinants of Health   Financial Resource Strain: Low Risk  (11/11/2021)   Overall Financial Resource Strain (CARDIA)    Difficulty of Paying Living Expenses: Not hard at all  Food Insecurity: No Food Insecurity (06/28/2022)   Hunger Vital Sign    Worried About Running Out of Food in the Last Year: Never true    Ran Out of Food in the Last Year: Never true  Transportation Needs: No Transportation Needs (06/28/2022)   PRAPARE - Administrator, Civil Service (Medical): No    Lack of Transportation (Non-Medical): No  Physical Activity: Not on file  Stress: Not on file  Social Connections: Moderately Isolated (11/11/2021)   Social Connection and Isolation  Panel [NHANES]    Frequency of Communication with Friends and Family: Twice a week  Frequency of Social Gatherings with Friends and Family: Once a week    Attends Religious Services: Never    Database administrator or Organizations: Yes    Attends Engineer, structural: More than 4 times per year    Marital Status: Divorced  Intimate Partner Violence: Not At Risk (06/28/2022)   Humiliation, Afraid, Rape, and Kick questionnaire    Fear of Current or Ex-Partner: No    Emotionally Abused: No    Physically Abused: No    Sexually Abused: No     Review of Systems    General:  No chills, fever, night sweats or weight changes.  Cardiovascular:  No chest pain, dyspnea on exertion, edema, orthopnea, palpitations, paroxysmal nocturnal dyspnea. Dermatological: No rash, lesions/masses Respiratory: No cough, dyspnea Urologic: No hematuria, dysuria Abdominal:   No nausea, vomiting, diarrhea, bright red blood per rectum, melena, or hematemesis Neurologic:  No visual changes, wkns, changes in mental status. All other systems reviewed and are otherwise negative except as noted above.  Physical Exam    VS:  BP (!) 142/74   Pulse 99   Ht 5\' 2"  (1.575 m)   Wt 156 lb 6.4 oz (70.9 kg)   SpO2 97%   BMI 28.61 kg/m  , BMI Body mass index is 28.61 kg/m. GEN: Well nourished, well developed, in no acute distress. HEENT: normal. Neck: Supple, no JVD, carotid bruits, or masses. Cardiac: RRR, no murmurs, rubs, or gallops. No clubbing, cyanosis, edema.  Radials/DP/PT 2+ and equal bilaterally.  Respiratory:  Respirations regular and unlabored, clear to auscultation bilaterally. GI: Soft, nontender, nondistended, BS + x 4. MS: no deformity or atrophy. Skin: warm and dry, no rash. Neuro:  Strength and sensation are intact. Psych: Normal affect.  Accessory Clinical Findings    Recent Labs: 02/28/2022: ALT 25; BUN 11; Creat 0.96; Hemoglobin 15.3; Magnesium 2.2; Platelets 398; Potassium 4.1; Sodium  142 03/28/2022: TSH 0.70   Recent Lipid Panel    Component Value Date/Time   CHOL 181 10/15/2021 1155   TRIG 181 (H) 10/15/2021 1155   HDL 53 10/15/2021 1155   CHOLHDL 3.4 10/15/2021 1155   VLDL 39 (H) 06/23/2016 1044   LDLCALC 100 (H) 10/15/2021 1155   LDLDIRECT 138.2 04/25/2008 1514    HYPERTENSION CONTROL Vitals:   07/20/22 1529 07/20/22 1602  BP: (!) 154/60 (!) 142/74    The patient's blood pressure is elevated above target today.  In order to address the patient's elevated BP: Blood pressure will be monitored at home to determine if medication changes need to be made.; The blood pressure is usually elevated in clinic.  Blood pressures monitored at home have been optimal.       ECG personally reviewed by me today-none today.  Echo 04/11/22:  1. Left ventricular ejection fraction, by estimation, is 60 to 65%. The  left ventricle has normal function. The left ventricle has no regional  wall motion abnormalities. There is mild concentric left ventricular  hypertrophy. Left ventricular diastolic  parameters are indeterminate.   2. Right ventricular systolic function is normal. The right ventricular  size is normal. There is normal pulmonary artery systolic pressure. The  estimated right ventricular systolic pressure is 28.0 mmHg.   3. The mitral valve is normal in structure. No evidence of mitral valve  regurgitation. No evidence of mitral stenosis.   4. The aortic valve is tricuspid. Aortic valve regurgitation is not  visualized. Aortic valve sclerosis/calcification is present, without any  evidence of aortic stenosis.  5. The inferior vena cava is normal in size with greater than 50%  respiratory variability, suggesting right atrial pressure of 3 mmHg.    Vascular renal artery bilateral US 03/31/22: Summary:  Largest Aortic Diameter: 2.5 cm  Renal:  Right: Normal size right kidney. Abnormal right Resistive Index.         Abnormal cortical thickness of right kidney.  1-59% stenosis         of the right renal artery. RRV flow present.  Left:  Normal size of left kidney. Normal left Resistive Index.         Normal cortical thickness of the left kidney. 1-59% stenosis         of the left renal artery. LRV flow present.  Mesenteric:  70 to 99% stenosis in the celiac artery and superior mesenteric artery.  Patent IVC.    NM Stress test with Lexiscan 03/25/22:   The study is normal. The study is low risk.   No ST deviation was noted.   LV perfusion is normal. There is no evidence of ischemia. There is no evidence of infarction.   Left ventricular function is normal. Nuclear stress EF: 69 %. The left ventricular ejection fraction is hyperdynamic (>65%). End diastolic cavity size is normal. End systolic cavity size is normal. No evidence of transient ischemic dilation (TID) noted.   Prior study not available for comparison.   10/27/2020  CT Abdomen/Pelvis: IMPRESSION: 1. No CT findings of the abdomen or pelvis to explain right lower quadrant abdominal pain. 2. Normal appendix. Aortic Atherosclerosis (ICD10-I70.0).  Cardiac event monitor 06/08/2022  Indication: palpitations, wear time 7 days.    Minimum HR (bpm): 48 Maximum HR (bpm): 129   Supraventricular Ectopy: <1% rare SVT: none   Ventricular Ectopy: <1% rare NSVT: none Ventricular Tachycardia: none   Pauses: none AV block: none   Atrial fibrillation: none   Diary events: none   IMPRESSION: Rare ectopy, no patient triggered events. No definite etiology of palpitations on cardiac monitor.     Summary: The patient's monitoring period was 06/08/2022 - 06/14/2022. Baseline sample showed Sinus Rhythm with a heart rate of 90.3 bpm. There were 0 critical, 0 serious, and 1 stable events that occurred. The report analysis of the critical, serious, stable and manually triggered events are listed below.  Assessment & Plan   1.  Palpitations-cardiac event monitor 06/08/2022 showed rare ectopy, no  patient triggers and no definite etiology for palpitation.  Heart rate was recorded in the 120s via smart watch.  Appears to have some component of anxiety.  TSH normal.   Avoid triggers caffeine, chocolate, EtOH, dehydration etc.  HTN-BP today 142/74.  Continues to be labile blood pressures at home.  Renal ultrasound showed no evidence of renal artery stenosis.   Heart healthy low-sodium diet-salty 6 reviewed Increase physical activity as tolerated Avoid secondary causes of hypertension. Mindfulness stress reduction Follow-up with advanced hypertension clinic  Obstructive sleep apnea-reports compliance with dental device. Avoid supine sleep, elevate head of bed, weight loss. Continue dental device usage.  Chest discomfort-no recent symptoms of chest discomfort.  Stress testing reassuring. No plans for ischemic evaluation  Anxiety-previously noted to be unable to tolerate bisoprolol and buspirone. Follows with PCP  Disposition: Follow-up with Dr. Jacques Navy  or me in 6 months.  Diana Ripple. Ashea Winiarski NP-C     07/20/2022, 4:03 PM [facility] [address] Office [phone] Fax [phone]    I spent 14 minutes examining  this patient, reviewing medications, and using patient centered shared decision making involving her cardiac care.  Prior to her visit I spent greater than 20 minutes reviewing her past medical history,  medications, and prior cardiac tests.

## 2022-07-20 ENCOUNTER — Encounter: Payer: Self-pay | Admitting: General Practice

## 2022-07-20 ENCOUNTER — Ambulatory Visit: Payer: Medicare Other | Attending: General Practice | Admitting: General Practice

## 2022-07-20 VITALS — BP 142/74 | HR 99 | Ht 62.0 in | Wt 156.4 lb

## 2022-07-20 DIAGNOSIS — F419 Anxiety disorder, unspecified: Secondary | ICD-10-CM

## 2022-07-20 DIAGNOSIS — G4733 Obstructive sleep apnea (adult) (pediatric): Secondary | ICD-10-CM

## 2022-07-20 DIAGNOSIS — R002 Palpitations: Secondary | ICD-10-CM | POA: Diagnosis not present

## 2022-07-20 DIAGNOSIS — R079 Chest pain, unspecified: Secondary | ICD-10-CM

## 2022-07-20 DIAGNOSIS — I10 Essential (primary) hypertension: Secondary | ICD-10-CM

## 2022-07-20 NOTE — Patient Instructions (Signed)
Medication Instructions:  The current medical regimen is effective;  continue present plan and medications as directed. Please refer to the Current Medication list given to you today.  *If you need a refill on your cardiac medications before your next appointment, please call your pharmacy*  Lab Work: NONE If you have labs (blood work) drawn today and your tests are completely normal, you will receive your results only by:  MyChart Message (if you have MyChart) OR  A paper copy in the mail If you have any lab test that is abnormal or we need to change your treatment, we will call you to review the results.  Testing/Procedures: NONE  Follow-Up: At [facility], you and your health needs are our priority.  As part of our continuing mission to provide you with exceptional heart care, we have created designated Provider Care Teams.  These Care Teams include your primary Cardiologist (physician) and Advanced Practice Providers (APPs -  Physician Assistants and Nurse Practitioners) who all work together to provide you with the care you need, when you need it.  Your next appointment:   6 month(s)  Provider:   Parke Poisson, MD     Other Instructions START PREP PROGRAM-AS BEFORE  CHECK YOUR BLOOD PRESSURE WHEN NEEDED

## 2022-07-27 ENCOUNTER — Other Ambulatory Visit: Payer: Self-pay | Admitting: Nurse Practitioner

## 2022-07-27 ENCOUNTER — Telehealth: Payer: Self-pay | Admitting: Nurse Practitioner

## 2022-07-27 ENCOUNTER — Telehealth: Payer: Self-pay

## 2022-07-27 DIAGNOSIS — F988 Other specified behavioral and emotional disorders with onset usually occurring in childhood and adolescence: Secondary | ICD-10-CM

## 2022-07-27 MED ORDER — AMPHETAMINE-DEXTROAMPHETAMINE 15 MG PO TABS
15.0000 mg | ORAL_TABLET | Freq: Every day | ORAL | 0 refills | Status: DC
Start: 2022-07-27 — End: 2022-08-31

## 2022-07-27 NOTE — Telephone Encounter (Signed)
Patient is requesting a refill on Adderall to [facility] on [address]

## 2022-07-27 NOTE — Telephone Encounter (Signed)
Called to confirm participation in next PREP Class at [facility] on June 18, every T/Th 10-11:15; assessment visit schedule for 6/17 at 2pm.

## 2022-07-27 NOTE — Progress Notes (Signed)
Was reported she was only taking 1/2 tab of Adderall 30 mg.  I have decreased Adderall dosage to 15 mg

## 2022-08-08 NOTE — Progress Notes (Signed)
[facility] PREP Evaluation  Patient Details  Name: Diana OBERHOLTZER MRN: [id number] Date of Birth: [birth year] Age: 72 y.o. PCP: Lucky Cowboy, MD  Vitals:   08/08/22 1429  BP: (!) 176/70  Pulse: 88  SpO2: 97%  Weight: 157 lb 9.6 oz (71.5 kg)     [facility] Eval - 08/08/22 1400       [facility] "PREP" Location   [facility] "PREP" Location [facility]      Referral    Referring Provider C. Walker    Reason for referral Hypertension    Program Start Date 08/09/22      Measurement   Waist Circumference 36 inches    Hip Circumference 43 inches    Body fat 42.7 percent      Information for Trainer   Goals --   Lose 10-15 pounds by end of program; better control of anxiety   Current Exercise --   dog training/walking   Orthopedic Concerns --   Remote L Tib/fib fracture; foot problems that affect balance   Pertinent Medical History --   HTN, OSA   Current Barriers work      Timed Up and Go (TUGS)   Timed Up and Go Low risk <9 seconds      Mobility and Daily Activities   I find it easy to walk up or down two or more flights of stairs. 3    I have no trouble taking out the trash. 4    I do housework such as vacuuming and dusting on my own without difficulty. 4    I can easily lift a gallon of milk (8lbs). 4    I can easily walk a mile. 3    I have no trouble reaching into high cupboards or reaching down to pick up something from the floor. 2    I do not have trouble doing out-door work such as Loss adjuster, chartered, raking leaves, or gardening. 2      Mobility and Daily Activities   I feel younger than my age. 2    I feel independent. 4    I feel energetic. 2    I live an active life.  2    I feel strong. 1    I feel healthy. 1    I feel active as other people my age. 2      How fit and strong are you.   Fit and Strong Total Score 36            Past Medical History:  Diagnosis Date   ADD (attention deficit disorder)    Anxiety    Depression    GERD (gastroesophageal reflux  disease)    Hyperlipidemia    Hypertension    Hypothyroid    PONV (postoperative nausea and vomiting)    Vitamin D deficiency    Past Surgical History:  Procedure Laterality Date   BREAST BIOPSY Left 07/09/2019   CESAREAN SECTION     ESOPHAGOGASTRODUODENOSCOPY N/A 09/02/2015   Procedure: ESOPHAGOGASTRODUODENOSCOPY (EGD);  Surgeon: Carman Ching, MD;  Location: [facility] ENDOSCOPY;  Service: Endoscopy;  Laterality: N/A;   ESOPHAGOGASTRODUODENOSCOPY N/A 06/05/2022   Procedure: ESOPHAGOGASTRODUODENOSCOPY (EGD);  Surgeon: Willis Modena, MD;  Location: [facility] ENDOSCOPY;  Service: Gastroenterology;  Laterality: N/A;   FOOT SURGERY Right    FOREIGN BODY REMOVAL N/A 06/05/2022   Procedure: FOREIGN BODY REMOVAL;  Surgeon: Willis Modena, MD;  Location: [facility] ENDOSCOPY;  Service: Gastroenterology;  Laterality: N/A;   IM NAILING TIBIA Left 01/09/2001  Dr. Doristine Section, [place]   TONSILLECTOMY  2002   Social History   Tobacco Use  Smoking Status Never  Smokeless Tobacco Never  To begin PREP class at [facility] June 18, every T/Th 10-11:15  Sonia Baller 08/08/2022, 2:33 PM

## 2022-08-09 NOTE — Progress Notes (Signed)
[facility] PREP Weekly Session  Patient Details  Name: Diana Wolfe MRN: [id number] Date of Birth: [birth year] Age: 72 y.o. PCP: Lucky Cowboy, MD  There were no vitals filed for this visit.   [facility] Weekly seesion - 08/09/22 1100       [facility] "PREP" Location   [facility] "PREP" Location [facility]      Weekly Session   Topic Discussed Goal setting and welcome to the program   introductions, review of notebook tour of facility; offered short intro to cardio machine w/option exercise   Classes attended to date 1             Diana Wolfe B Claryce Friel 08/09/2022, 11:43 AM

## 2022-08-16 NOTE — Progress Notes (Signed)
[facility] PREP Weekly Session  Patient Details  Name: Diana Wolfe MRN: [id number] Date of Birth: 1951-01-26 Age: 72 y.o. PCP: Lucky Cowboy, MD  Vitals:   08/16/22 1142  Weight: 157 lb (71.2 kg)     [facility] Weekly seesion - 08/16/22 1100       [facility] "PREP" Location   [facility] "PREP" Location [facility]      Weekly Session   Topic Discussed Importance of resistance training;Other ways to be active   Cardio goal: work up to 150 minutes/wk; resistance training: work up to 2-3 times/wk for 20-40 minutes   Minutes exercised this week 80 minutes    Classes attended to date 3             Lashala Laser B Davontay Watlington 08/16/2022, 11:43 AM

## 2022-08-23 NOTE — Progress Notes (Signed)
[facility] PREP Weekly Session  Patient Details  Name: Diana Wolfe MRN: [id number] Date of Birth: 04/20/1950 Age: 72 y.o. PCP: Lucky Cowboy, MD  Vitals:   08/23/22 1137  Weight: 156 lb (70.8 kg)     [facility] Weekly seesion - 08/23/22 1100       [facility] "PREP" Location   [facility] "PREP" Location [facility]      Weekly Session   Topic Discussed Healthy eating tips   Foods to increase, foods to reduce; introduced YUKA app, eat the rainbow of colors   Minutes exercised this week 90 minutes    Classes attended to date 5             Chardai Gangemi B Masud Holub 08/23/2022, 11:38 AM

## 2022-08-24 DIAGNOSIS — M19072 Primary osteoarthritis, left ankle and foot: Secondary | ICD-10-CM | POA: Diagnosis not present

## 2022-08-30 NOTE — Progress Notes (Signed)
[facility] PREP Weekly Session  Patient Details  Name: Diana Wolfe MRN: [id number] Date of Birth: 20-Apr-1950 Age: 72 y.o. PCP: Lucky Cowboy, MD  Vitals:   08/30/22 1152  Weight: 156 lb (70.8 kg)     [facility] Weekly seesion - 08/30/22 1100       [facility] "PREP" Location   [facility] "PREP" Location [facility]      Weekly Session   Topic Discussed Health habits   Limit added sugars to 24 gms/women, 36 gms/men per day; Sugar demo   Minutes exercised this week 70 minutes    Classes attended to date 6             Jearlene Bridwell B Donnel Venuto 08/30/2022, 11:52 AM

## 2022-08-31 ENCOUNTER — Other Ambulatory Visit: Payer: Self-pay | Admitting: Nurse Practitioner

## 2022-08-31 DIAGNOSIS — F988 Other specified behavioral and emotional disorders with onset usually occurring in childhood and adolescence: Secondary | ICD-10-CM

## 2022-08-31 MED ORDER — AMPHETAMINE-DEXTROAMPHETAMINE 15 MG PO TABS
15.0000 mg | ORAL_TABLET | Freq: Every day | ORAL | 0 refills | Status: DC
Start: 2022-08-31 — End: 2022-10-06

## 2022-09-06 NOTE — Progress Notes (Signed)
[facility] PREP Weekly Session  Patient Details  Name: ENSLIE SAHOTA MRN: [id number] Date of Birth: Dec 27, 1950 Age: 72 y.o. PCP: Lucky Cowboy, MD  Vitals:   09/06/22 1112  Weight: 156 lb 4.8 oz (70.9 kg)     [facility] Weekly seesion - 09/06/22 1100       [facility] "PREP" Location   [facility] "PREP[facility] Location [facility]      Weekly Session   Topic Discussed Restaurant Eating   Limit Na intake to 1500-2300 mg/day; salt demo   Minutes exercised this week 80 minutes    Classes attended to date 8             Hugh Garrow B Dimitria Ketchum 09/06/2022, 11:12 AM

## 2022-09-08 ENCOUNTER — Encounter: Payer: Self-pay | Admitting: Internal Medicine

## 2022-09-09 DIAGNOSIS — M19071 Primary osteoarthritis, right ankle and foot: Secondary | ICD-10-CM | POA: Diagnosis not present

## 2022-09-09 DIAGNOSIS — M19072 Primary osteoarthritis, left ankle and foot: Secondary | ICD-10-CM | POA: Diagnosis not present

## 2022-09-13 NOTE — Progress Notes (Signed)
[facility] PREP Weekly Session  Patient Details  Name: MENDI CONSTABLE MRN: [id number] Date of Birth: 1950-10-08 Age: 72 y.o. PCP: Lucky Cowboy, MD  Vitals:   09/13/22 1059  Weight: 154 lb 3.2 oz (69.9 kg)     [facility] Weekly seesion - 09/13/22 1000       [facility] "PREP" Location   [facility] "PREP" Location [facility]      Weekly Session   Topic Discussed Stress management and problem solving   Finger tip mudra breathwork; importance of sleep: 7-9 hrs/night   Minutes exercised this week 140 minutes    Classes attended to date 10             Emori Kamau B Kimyah Frein 09/13/2022, 11:01 AM

## 2022-09-15 ENCOUNTER — Ambulatory Visit: Payer: Medicare Other | Admitting: Internal Medicine

## 2022-09-20 NOTE — Progress Notes (Signed)
[facility] PREP Weekly Session  Patient Details  Name: Diana Wolfe MRN: [id number] Date of Birth: Mar 02, 1950 Age: 72 y.o. PCP: Lucky Cowboy, MD  Vitals:   09/20/22 1056  Weight: 156 lb (70.8 kg)     [facility] Weekly seesion - 09/20/22 1000       [facility] "PREP" Location   [facility] "PREP" Location [facility]      Weekly Session   Topic Discussed Expectations and non-scale victories   Halfway thru program---review, revisit, re-state goals; staying positive   Minutes exercised this week 220 minutes    Classes attended to date 59             Leopold Smyers B Rollande Thursby 09/20/2022, 10:58 AM

## 2022-09-22 ENCOUNTER — Other Ambulatory Visit: Payer: Self-pay | Admitting: Nurse Practitioner

## 2022-09-22 DIAGNOSIS — E039 Hypothyroidism, unspecified: Secondary | ICD-10-CM

## 2022-09-26 NOTE — Progress Notes (Signed)
[facility] PREP Weekly Session  Patient Details  Name: Diana Wolfe MRN: [id number] Date of Birth: 12-13-1950 Age: 72 y.o. PCP: Lucky Cowboy, MD  Vitals:   09/26/22 1459  Weight: 156 lb 9.6 oz (71 kg)     [facility] Weekly seesion - 09/26/22 1500       [facility] "PREP" Location   [facility] "PREP" Location [facility]      Weekly Session   Topic Discussed Other   Portion control, visualize your portion size demo; review of Red, Sugar Craisin food label.   Minutes exercised this week 95 minutes    Classes attended to date 37             [name] B [name] 09/26/2022, 3:02 PM

## 2022-09-30 ENCOUNTER — Ambulatory Visit: Payer: Medicare Other | Admitting: Nurse Practitioner

## 2022-09-30 ENCOUNTER — Encounter: Payer: Self-pay | Admitting: Nurse Practitioner

## 2022-09-30 VITALS — BP 150/88 | HR 76 | Temp 97.5°F | Ht 62.0 in | Wt 154.8 lb

## 2022-09-30 DIAGNOSIS — R110 Nausea: Secondary | ICD-10-CM

## 2022-09-30 DIAGNOSIS — R42 Dizziness and giddiness: Secondary | ICD-10-CM | POA: Diagnosis not present

## 2022-09-30 DIAGNOSIS — R0989 Other specified symptoms and signs involving the circulatory and respiratory systems: Secondary | ICD-10-CM

## 2022-09-30 DIAGNOSIS — Z79899 Other long term (current) drug therapy: Secondary | ICD-10-CM | POA: Diagnosis not present

## 2022-09-30 MED ORDER — MECLIZINE HCL 25 MG PO TABS
ORAL_TABLET | ORAL | 0 refills | Status: AC
Start: 2022-09-30 — End: ?

## 2022-09-30 MED ORDER — ONDANSETRON 4 MG PO TBDP
4.0000 mg | ORAL_TABLET | Freq: Three times a day (TID) | ORAL | 0 refills | Status: DC | PRN
Start: 2022-09-30 — End: 2023-02-07

## 2022-09-30 NOTE — Progress Notes (Signed)
Assessment and Plan:  Diana Wolfe was seen today for an episodic visit.  Diagnoses and all order for this visit:  1. Vertigo Start Meclizine as directed for tmt of symptoms.  Change positions slowly when moving to help decrease risk for fall. If s/s fail to improve discussed referral to PT for possible Epley Maneuver.  - meclizine (ANTIVERT) 25 MG tablet; 1/2-1 pill up to 3 times daily for motion sickness/dizziness  Dispense: 30 tablet; Refill: 0  2. Nausea Start Ondansetron as needed for nausea. Push fluids. Continue to monitor  - ondansetron (ZOFRAN-ODT) 4 MG disintegrating tablet; Take 1 tablet (4 mg total) by mouth every 8 (eight) hours as needed for nausea or vomiting.  Dispense: 20 tablet; Refill: 0  3. Labile hypertension Elevated in clinic - asymptomatic Discussed DASH (Dietary Approaches to Stop Hypertension) DASH diet is lower in sodium than a typical American diet. Cut back on foods that are high in saturated fat, cholesterol, and trans fats. Eat more whole-grain foods, fish, poultry, and nuts Remain active and exercise as tolerated daily.  Monitor BP at home-Call if greater than 130/80.   4. Medication management All medications discussed and reviewed in full. All questions and concerns regarding medications addressed.    Notify office for further evaluation and treatment, questions or concerns if s/s fail to improve. The risks and benefits of my recommendations, as well as other treatment options were discussed with the patient today. Questions were answered.  Further disposition pending results of labs. Discussed med's effects and SE's.    Over 15 minutes of exam, counseling, chart review, and critical decision making was performed.   Future Appointments  Date Time Provider Department Center  10/18/2022 11:00 AM Raynelle Dick, NP [facility] None  01/30/2023  1:20 PM Parke Poisson, MD [facility] None     ------------------------------------------------------------------------------------------------------------------   HPI BP (!) 150/88   Pulse 76   Temp (!) 97.5 F (36.4 C)   Ht 5\' 2"  (1.575 m)   Wt 154 lb 12.8 oz (70.2 kg)   SpO2 98%   BMI 28.31 kg/m   She complains of dizziness accompanied by nausea. The dizziness has been present for 1 week. She describes the symptoms as disequalibirum, vertigo, and lightheadedness. Symptoms are exacerbated by rapid head movements, looking  downward, rising from squatting or sitting position, and bending. She also complains of none. She denies aural pressure otalgia otorrhea tinnitus hearing loss Recent travel swimming or flying or URI.  She does have environmental allergies to which is controlled by Zyrtec .  She has been treated with meclizine (Antivert) with good improvement but has not taken recently.  Her last vertigo episode was 10 years ago.    Past Medical History:  Diagnosis Date   ADD (attention deficit disorder)    Anxiety    Depression    GERD (gastroesophageal reflux disease)    Hyperlipidemia    Hypertension    Hypothyroid    PONV (postoperative nausea and vomiting)    Vitamin D deficiency      Allergies  Allergen Reactions   Levofloxacin     Neck sweling/difficult breathing   Buspirone    Cefprozil    Codeine     REACTION: vomiting   Hydrocodone-Acetaminophen    Morphine    Nitrofuran Derivatives Itching   Pravastatin Swelling   Lexapro [Escitalopram Oxalate] Other (See Comments)    Tapping/abnormal movements    Current Outpatient Medications on File Prior to Visit  Medication Sig  amphetamine-dextroamphetamine (ADDERALL) 15 MG tablet Take 1 tablet by mouth daily.   aspirin 81 MG chewable tablet Once a week   cetirizine (ZYRTEC) 10 MG tablet 1 tablet   hydrALAZINE (APRESOLINE) 10 MG tablet Take half tablet as needed for systolic blood pressure (top number) more than 190. If blood pressure remains more  than 190 one hour after taking half tablet, may take additional half tablet.   levothyroxine (SYNTHROID) 75 MCG tablet TAKE 1 AND 1/2 TABLETS BY MOUTH ON MONDAYS, WEDNESDAYS AND FRIDAYS THEN TAKE 1 TABLET ON OTHER DAYS OF THE WEEK. TAKE ON EMPTY STOMACH WITH ONLY WATER FOR 30 MINUTES AND NO ANTACIDS, CALCIUM OR MAGNESIUM FOR 4 HOURS AND AVOID BIOTIN   meloxicam (MOBIC) 15 MG tablet TAKE 1/2 TO 1 TABLET BY MOUTH DAILY WITH FOOD FOR PAIN AND INFLAMMATION (Patient taking differently: TAKE 1/2 TO 1 TABLET BY MOUTH DAILY WITH FOOD FOR PAIN AND INFLAMMATION as needed)   Multiple Vitamin (MULTIVITAMIN) capsule Take 1 capsule by mouth daily.   omeprazole (PRILOSEC) 40 MG capsule Take 1 capsule (40 mg total) by mouth daily. TAKE ONE CAPSULE BY MOUTH DAILY TO PREVENT HEARTBURN AND INDIGESTION (Patient taking differently: Take 40 mg by mouth as needed. TAKE ONE CAPSULE BY MOUTH DAILY TO PREVENT HEARTBURN AND INDIGESTION)   vitamin C (ASCORBIC ACID) 500 MG tablet Take 1,200 mg by mouth daily.   Vitamin D, Ergocalciferol, (DRISDOL) 1.25 MG (50000 UNIT) CAPS capsule TAKE 1 CAPSULE BY MOUTH 3 TIMES A WEEK ON MONDAY- WEDNESDAY- FRIDAY FOR SEVERE VITAMIN D. DEFICIENCY   Cyanocobalamin (B-12) 1000 MCG TABS Take by mouth. (Patient not taking: Reported on 09/30/2022)   No current facility-administered medications on file prior to visit.    ROS: all negative except what is noted in the HPI.   Physical Exam:  BP (!) 150/88   Pulse 76   Temp (!) 97.5 F (36.4 C)   Ht 5\' 2"  (1.575 m)   Wt 154 lb 12.8 oz (70.2 kg)   SpO2 98%   BMI 28.31 kg/m   General Appearance: NAD.  Awake, conversant and cooperative. Eyes: PERRLA, EOMs intact.  Sclera white.  Conjunctiva without erythema. Sinuses: No frontal/maxillary tenderness.  No nasal discharge. Nares patent.  ENT/Mouth: Ext aud canals clear.  Bilateral TMs w/DOL and without erythema or bulging. Hearing intact.  Posterior pharynx without swelling or exudate.  Tonsils  without swelling or erythema.  Neck: Supple.  No masses, nodules or thyromegaly. Respiratory: Effort is regular with non-labored breathing. Breath sounds are equal bilaterally without rales, rhonchi, wheezing or stridor.  Cardio: RRR with no MRGs. Brisk peripheral pulses without edema.  Abdomen: Active BS in all four quadrants.  Soft and non-tender without guarding, rebound tenderness, hernias or masses. Lymphatics: Non tender without lymphadenopathy.  Musculoskeletal: Full ROM, 5/5 strength, normal ambulation.  No clubbing or cyanosis. Skin: Appropriate color for ethnicity. Warm without rashes, lesions, ecchymosis, ulcers.  Neuro: CN II-XII grossly normal. Normal muscle tone without cerebellar symptoms and intact sensation.   Psych: AO X 3,  appropriate mood and affect, insight and judgment.     Adela Glimpse, NP 9:40 AM [facility]

## 2022-10-05 ENCOUNTER — Encounter: Payer: Self-pay | Admitting: Nurse Practitioner

## 2022-10-06 ENCOUNTER — Other Ambulatory Visit: Payer: Self-pay | Admitting: Nurse Practitioner

## 2022-10-06 DIAGNOSIS — F988 Other specified behavioral and emotional disorders with onset usually occurring in childhood and adolescence: Secondary | ICD-10-CM

## 2022-10-06 MED ORDER — AMPHETAMINE-DEXTROAMPHETAMINE 15 MG PO TABS
15.0000 mg | ORAL_TABLET | Freq: Every day | ORAL | 0 refills | Status: DC
Start: 2022-10-06 — End: 2022-10-18

## 2022-10-11 NOTE — Progress Notes (Signed)
[facility] PREP Weekly Session  Patient Details  Name: Diana Wolfe MRN: [id number] Date of Birth: 08/05/1950 Age: 72 y.o. PCP: Lucky Cowboy, MD  Vitals:   10/11/22 1113  Weight: 155 lb (70.3 kg)     [facility] Weekly seesion - 10/11/22 1100       [facility] "PREP" Location   [facility] "PREP" Location [facility]      Weekly Session   Topic Discussed Finding support    Minutes exercised this week 210 minutes    Classes attended to date 17             [name] 10/11/2022, 11:14 AM

## 2022-10-12 ENCOUNTER — Other Ambulatory Visit: Payer: Self-pay | Admitting: Internal Medicine

## 2022-10-12 DIAGNOSIS — Z1231 Encounter for screening mammogram for malignant neoplasm of breast: Secondary | ICD-10-CM

## 2022-10-17 NOTE — Progress Notes (Unsigned)
MEDICARE WELLNESS AND FOLLOW UP  Assessment:   Diana Wolfe was seen today for follow-up and medicare wellness.  Diagnoses and all orders for this visit:  Encounter for Medicare annual wellness exam  Due Annually  Health Maintenance Reviewed  Healthy lifestyle reviewed and goals set   Essential hypertension  - continue medications, DASH diet, exercise and monitor at home. Call if greater than 130/80.    - Take hydralazine 10 mg every day if BP greater than 190 on top  Abnormal glucose Continue diet and exercise -     Hemoglobin A1c  Recurrent major depressive disorder, in full remission (HCC)  Pt believes symptoms are more related to Anxiety.    Continue researching TMS therapy.   Instructed patient to contact office or on-call physician promptly should condition worsen or any new symptoms appear. IF THE PATIENT HAS ANY SUICIDAL OR HOMICIDAL IDEATIONS, CALL THE OFFICE, DISCUSS WITH A SUPPORT MEMBER, OR GO TO THE ER IMMEDIATELY. Patient was agreeable with this plan.   Attention deficit disorder, unspecified hyperactivity presence       Continue Adderall 20 mg take 1/2 tab bid- or 1 tab QD  Anxiety - Continue peer support group  Gastroesophageal reflux disease without esophagitis  Continue Prilosec 40 mg T- uses three days a week  Have esophageal dilation 3 months ago.   Hypothyroidism, unspecified type Please take your thyroid medication greater than 30 min before breakfast, separated by at least 4 hours  from antacids, calcium, iron, and multivitamins.  -     TSH  Hyperlipidemia, unspecified hyperlipidemia type Continue diet and exercise -     COMPLETE METABOLIC PANEL WITH GFR -     Lipid panel  Vitamin D deficiency       - Ergocalciferol 50000 units three times a week  Defer Vit D level today  Iron deficiency anemia, unspecified iron deficiency anemia type -     CBC with Differential/Platelet     Over 40 minutes of exam, counseling, chart review and critical  decision making was performed Future Appointments  Date Time Provider Department Center  10/21/2022  5:10 PM [facility]  12/12/2022  9:30 AM [place], Betty G, MD [facility]  01/30/2023  1:20 PM Parke Poisson, MD [facility] None  10/18/2023 11:30 AM Raynelle Dick, NP [facility] None    Subjective:  Diana Wolfe is a 72 y.o.  female who presents for medicare wellness and 3 month follow up.   Overall she reports doing well.  When she took Buspar it did elevate her BP.  Not taking anxiety meds due to that.  Still has anxiety- she does try relaxation techniques and [facility]. She does not take medication for depression but is having a lot of issues with depression, is going to a peer support group.  Declines medications currently  She continues to have trouble with extreme somnolence when driving and will almost fall asleep while driving.  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 She never gets sleepy during if not driving  ADHD symptoms are currently and uses Adderall 1/2 in am as needed and will take another half if needed when there is a long drive. Does not have same effect with use of 15 mg , symptoms were controlled on 20 mg.    Her blood pressure has been controlled at home 130's/60-70's, BP has not gotten  to 190's where she needs to take hydralazine. Continues to have labile blood pressures. Today their BP is BP: 138/86 BP Readings from Last 3 Encounters:  10/18/22 138/86  09/30/22 (!) 150/88  08/08/22 (!) 176/70    [facility], she is active with her 2 dogs- can walk 5 miles a day.  Her dogs sniff out bed bugs. . She denies chest pain, shortness of breath, dizziness.  BMI is Body mass index is 28.28 kg/m., she is working on diet and exercise. Wt Readings from Last 3 Encounters:  10/18/22 154 lb 9.6 oz (70.1 kg)  10/11/22 155 lb (70.3 kg)  09/30/22 154  lb 12.8 oz (70.2 kg)   Currently on levothyroxine 75 mcg 1 1/2 tab M-W-F  and 1 tab the other days. Last tsh was:  Lab Results  Component Value Date   TSH 0.70 03/28/2022     She is not on cholesterol medication. Her cholesterol is not at goal. Pt refuses cholesterol medication. The cholesterol last visit was:   Lab Results  Component Value Date   CHOL 181 10/15/2021   HDL 53 10/15/2021   LDLCALC 100 (H) 10/15/2021   LDLDIRECT 138.2 04/25/2008   TRIG 181 (H) 10/15/2021   CHOLHDL 3.4 10/15/2021     Last A1C in the office was:  Lab Results  Component Value Date   HGBA1C 5.5 10/15/2021   Last GFR: Lab Results  Component Value Date   EGFR 64 02/28/2022   .  Patient is on Vitamin D supplement.   Lab Results  Component Value Date   VD25OH 73 03/24/2021      Medication Review:  Current Outpatient Medications (Endocrine & Metabolic):    levothyroxine (SYNTHROID) 75 MCG tablet, TAKE 1 AND 1/2 TABLETS BY MOUTH ON MONDAYS, WEDNESDAYS AND FRIDAYS THEN TAKE 1 TABLET ON OTHER DAYS OF THE WEEK. TAKE ON EMPTY STOMACH WITH ONLY WATER FOR 30 MINUTES AND NO ANTACIDS, CALCIUM OR MAGNESIUM FOR 4 HOURS AND AVOID BIOTIN  Current Outpatient Medications (Cardiovascular):    hydrALAZINE (APRESOLINE) 10 MG tablet, Take half tablet as needed for systolic blood pressure (top number) more than 190. If blood pressure remains more than 190 one hour after taking half tablet, may take additional half tablet.  Current Outpatient Medications (Respiratory):    cetirizine (ZYRTEC) 10 MG tablet, PRN  Current Outpatient Medications (Analgesics):    aspirin 81 MG chewable tablet, 3-4 days a week   meloxicam (MOBIC) 15 MG tablet, TAKE 1/2 TO 1 TABLET BY MOUTH DAILY WITH FOOD FOR PAIN AND INFLAMMATION (Patient taking differently: TAKE 1/2 TO 1 TABLET BY MOUTH DAILY WITH FOOD FOR PAIN AND INFLAMMATION as needed)  Current Outpatient Medications (Hematological):    Cyanocobalamin (B-12) 1000 MCG TABS, Take by  mouth.  Current Outpatient Medications (Other):    meclizine (ANTIVERT) 25 MG tablet, 1/2-1 pill up to 3 times daily for motion sickness/dizziness   Multiple Vitamin (MULTIVITAMIN) capsule, Take 1 capsule by mouth daily.   omeprazole (PRILOSEC) 40 MG capsule, Take 1 capsule (40 mg total) by mouth daily. TAKE ONE CAPSULE BY MOUTH DAILY TO PREVENT HEARTBURN AND INDIGESTION (Patient taking differently: Take 40 mg by mouth as needed. TAKE ONE CAPSULE BY MOUTH DAILY TO PREVENT HEARTBURN AND INDIGESTION)   ondansetron (ZOFRAN-ODT) 4 MG disintegrating tablet, Take 1 tablet (4 mg total) by mouth every 8 (eight) hours as needed for nausea or vomiting.   vitamin C (ASCORBIC ACID) 500 MG tablet, Take 1,200  mg by mouth daily.   Vitamin D, Ergocalciferol, (DRISDOL) 1.25 MG (50000 UNIT) CAPS capsule, TAKE 1 CAPSULE BY MOUTH 3 TIMES A WEEK ON MONDAY- WEDNESDAY- FRIDAY FOR SEVERE VITAMIN D. 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   Dysphagia 04/21/2020   Generalized abdominal pain 04/21/2020   Hematochezia 04/21/2020   Left lower quadrant pain 04/21/2020   Personal history of colonic polyps 04/21/2020   Urticaria 04/21/2020   B12 deficiency 01/31/2019   Medication management 06/23/2014   Abnormal glucose 03/27/2014   Vaginal atrophy 03/27/2014   Hyperlipidemia    Hypertension    Anxiety    GERD (gastroesophageal reflux disease)    ADD (attention deficit disorder)    Depression    Vitamin D deficiency    Hypothyroidism 04/25/2008   Iron  deficiency anemia 04/25/2008   Obstructive sleep apnea 11/16/2007   Allergic rhinitis 11/16/2007   Headache 11/16/2007   PULMONARY EMBOLISM, HX OF 11/16/2007    Screening Tests Immunization History  Administered Date(s) Administered   Fluad Quad(high Dose 65+) 10/28/2018   Influenza Whole 11/22/2007   Influenza, High Dose Seasonal PF 11/10/2019, 12/16/2021   Influenza-Unspecified 10/28/2016, 11/11/2020   Moderna SARS-COV2 Booster Vaccination 04/06/2019, 05/04/2019   PFIZER(Purple Top)SARS-COV-2 Vaccination 01/30/2020   Pneumococcal Conjugate-13 05/02/2017   Pneumococcal Polysaccharide-23 10/11/2018   Td 04/25/2008, 05/16/2018   Varicella Zoster Immune Globulin 12/31/2014   Health Maintenance  Topic Date Due   COVID-19 Vaccine (2 - Pfizer risk series) 11/03/2022 (Originally 02/20/2020)   INFLUENZA VACCINE  12/18/2022 (Originally 09/22/2022)   Zoster Vaccines- Shingrix (1 of 2) 01/18/2023 (Originally 04/05/1971)   MAMMOGRAM  11/20/2022   Medicare Annual Wellness (AWV)  10/18/2023   DTaP/Tdap/Td (3 - Tdap) 05/15/2028   Colonoscopy  12/01/2029   Pneumonia Vaccine 31+ Years old  Completed   DEXA SCAN  Completed   Hepatitis C Screening  Completed   HPV VACCINES  Aged Out     Names of Other Physician/Practitioners you currently use: 1. [facility] here for primary care 2. [facility], eye doctor, last visit 2023 just got new glasses. 3. Dr. Richardine Service, dentist, last visit yearly 2022 Patient Care Team: Lucky Cowboy, MD as PCP - General (Internal Medicine) Parke Poisson, MD as PCP - Cardiology (Cardiology)  SURGICAL HISTORY She  has a past surgical history that includes Foot surgery (Right); Cesarean section; Tonsillectomy (2002); Esophagogastroduodenoscopy (N/A, 09/02/2015); IM nailing tibia (Left, 01/09/2001); Breast biopsy (Left, 07/09/2019); Esophagogastroduodenoscopy (N/A, 06/05/2022); and Foreign Body Removal (N/A,  06/05/2022). FAMILY HISTORY Her family history includes Breast cancer in her cousin and mother; Cancer (age of onset: 74) in her mother; Heart disease in her father; Hypertension in her father. SOCIAL HISTORY She  reports that she has never smoked. She has never used smokeless tobacco. She reports current alcohol use. She reports that she does not use drugs.   Review of Systems  Constitutional: Negative.  Negative for chills, fever and weight loss.  HENT:  Negative for congestion, ear  pain, hearing loss and sore throat.   Eyes: Negative.  Negative for blurred vision and double vision.  Respiratory: Negative.  Negative for cough and shortness of breath.   Cardiovascular: Negative.  Negative for chest pain, palpitations, orthopnea and leg swelling.  Gastrointestinal:  Positive for heartburn. Negative for abdominal pain, constipation, diarrhea, nausea and vomiting.  Genitourinary: Negative.   Musculoskeletal:  Negative for back pain, falls, joint pain and myalgias.  Skin: Negative.  Negative for rash.  Neurological:  Negative for dizziness, tingling, tremors, loss of consciousness and headaches.  Psychiatric/Behavioral:  Negative for depression, memory loss and suicidal ideas. The patient is nervous/anxious.     Objective:     Today's Vitals   10/18/22 1056  BP: 138/86  Pulse: 75  Temp: (!) 97.5 F (36.4 C)  SpO2: 95%  Weight: 154 lb 9.6 oz (70.1 kg)  Height: 5\' 2"  (1.575 m)    Body mass index is 28.28 kg/m.  General appearance: alert, no distress, WD/WN, female HEENT: normocephalic, sclerae anicteric, TMs pearly on right ear, nares patent, no discharge or erythema, pharynx normal.  Oral cavity: MMM, no lesions, scalloped tongue Neck: supple, no lymphadenopathy, no thyromegaly, no masses Heart: RRR, normal S1, S2 prominent S2, no murmurs Lungs: CTA bilaterally, no wheezes, rhonchi, or rales Abdomen: +bs, soft,  Musculoskeletal: nontender, no swelling,  Extremities: no edema,  no cyanosis, no clubbing Pulses: 2+ symmetric, upper and lower extremities, normal cap refill Neurological: alert, oriented x 3, CN2-12 intact, strength normal upper extremities and lower extremities, sensation normal throughout, DTRs 2+ throughout, no cerebellar signs, gait normal Psychiatric:flat affect, behavior normal, pleasant   Medicare Attestation I have personally reviewed: The patient's medical and social history Their use of alcohol, tobacco or illicit drugs Their current medications and supplements The patient's functional ability including ADLs,fall risks, home safety risks, cognitive, and hearing and visual impairment Diet and physical activities Evidence for depression or mood disorders  The patient's weight, height, BMI, and visual acuity have been recorded in the chart.  I have made referrals, counseling, and provided education to the patient based on review of the above and I have provided the patient with a written personalized care plan for preventive services.    Revonda Humphrey ANP-C  [facility]  10/18/2022

## 2022-10-18 ENCOUNTER — Encounter: Payer: Self-pay | Admitting: Nurse Practitioner

## 2022-10-18 ENCOUNTER — Ambulatory Visit (INDEPENDENT_AMBULATORY_CARE_PROVIDER_SITE_OTHER): Payer: Medicare Other | Admitting: Nurse Practitioner

## 2022-10-18 VITALS — BP 138/86 | HR 75 | Temp 97.5°F | Ht 62.0 in | Wt 154.6 lb

## 2022-10-18 DIAGNOSIS — F988 Other specified behavioral and emotional disorders with onset usually occurring in childhood and adolescence: Secondary | ICD-10-CM | POA: Diagnosis not present

## 2022-10-18 DIAGNOSIS — E559 Vitamin D deficiency, unspecified: Secondary | ICD-10-CM | POA: Diagnosis not present

## 2022-10-18 DIAGNOSIS — D509 Iron deficiency anemia, unspecified: Secondary | ICD-10-CM | POA: Diagnosis not present

## 2022-10-18 DIAGNOSIS — K219 Gastro-esophageal reflux disease without esophagitis: Secondary | ICD-10-CM

## 2022-10-18 DIAGNOSIS — Z0001 Encounter for general adult medical examination with abnormal findings: Secondary | ICD-10-CM

## 2022-10-18 DIAGNOSIS — Z79899 Other long term (current) drug therapy: Secondary | ICD-10-CM | POA: Diagnosis not present

## 2022-10-18 DIAGNOSIS — R400 Somnolence: Secondary | ICD-10-CM | POA: Diagnosis not present

## 2022-10-18 DIAGNOSIS — F3342 Major depressive disorder, recurrent, in full remission: Secondary | ICD-10-CM

## 2022-10-18 DIAGNOSIS — R0989 Other specified symptoms and signs involving the circulatory and respiratory systems: Secondary | ICD-10-CM

## 2022-10-18 DIAGNOSIS — R6889 Other general symptoms and signs: Secondary | ICD-10-CM | POA: Diagnosis not present

## 2022-10-18 DIAGNOSIS — F419 Anxiety disorder, unspecified: Secondary | ICD-10-CM

## 2022-10-18 DIAGNOSIS — E039 Hypothyroidism, unspecified: Secondary | ICD-10-CM | POA: Diagnosis not present

## 2022-10-18 DIAGNOSIS — R7309 Other abnormal glucose: Secondary | ICD-10-CM | POA: Diagnosis not present

## 2022-10-18 DIAGNOSIS — Z0000 Encounter for general adult medical examination without abnormal findings: Secondary | ICD-10-CM

## 2022-10-18 DIAGNOSIS — E785 Hyperlipidemia, unspecified: Secondary | ICD-10-CM | POA: Diagnosis not present

## 2022-10-18 MED ORDER — AMPHETAMINE-DEXTROAMPHETAMINE 20 MG PO TABS
ORAL_TABLET | ORAL | Status: DC
Start: 2022-10-18 — End: 2022-11-03

## 2022-10-18 NOTE — Patient Instructions (Signed)
Managing Depression, Adult Depression is a mental health condition that affects your thoughts, feelings, and actions. Being diagnosed with depression can bring you relief if you did not know why you have felt or behaved a certain way. It could also leave you feeling overwhelmed. Finding ways to manage your symptoms can help you feel more positive about your future. How to manage lifestyle changes Being depressed is difficult. Depression can increase the level of everyday stress. Stress can make depression symptoms worse. You may believe your symptoms cannot be managed or will never improve. However, there are many things you can try to help manage your symptoms. There is hope. Managing stress  Stress is your body's reaction to life changes and events, both good and bad. Stress can add to your feelings of depression. Learning to manage your stress can help lessen your feelings of depression. Try some of the following approaches to reducing your stress (stress reduction techniques): Listen to music that you enjoy and that inspires you. Try using a meditation app or take a meditation class. Develop a practice that helps you connect with your spiritual self. Walk in nature, pray, or go to a place of worship. Practice deep breathing. To do this, inhale slowly through your nose. Pause at the top of your inhale for a few seconds and then exhale slowly, letting yourself relax. Repeat this three or four times. Practice yoga to help relax and work your muscles. Choose a stress reduction technique that works for you. These techniques take time and practice to develop. Set aside 5-15 minutes a day to do them. Therapists can offer training in these techniques. Do these things to help manage stress: Keep a journal. Know your limits. Set healthy boundaries for yourself and others, such as saying "no" when you think something is too much. Pay attention to how you react to certain situations. You may not be able to  control everything, but you can change your reaction. Add humor to your life by watching funny movies or shows. Make time for activities that you enjoy and that relax you. Spend less time using electronics, especially at night before bed. The light from screens can make your brain think it is time to get up rather than go to bed.  Medicines Medicines, such as antidepressants, are often a part of treatment for depression. Talk with your pharmacist or health care provider about all the medicines, supplements, and herbal products that you take, their possible side effects, and what medicines and other products are safe to take together. Make sure to report any side effects you may have to your health care provider. Relationships Your health care provider may suggest family therapy, couples therapy, or individual therapy as part of your treatment. How to recognize changes Everyone responds differently to treatment for depression. As you recover from depression, you may start to: Have more interest in doing activities. Feel more hopeful. Have more energy. Eat a more regular amount of food. Have better mental focus. It is important to recognize if your depression is not getting better or is getting worse. The symptoms you had in the beginning may return, such as: Feeling tired. Eating too much or too little. Sleeping too much or too little. Feeling restless, agitated, or hopeless. Trouble focusing or making decisions. Having unexplained aches and pains. Feeling irritable, angry, or aggressive. If you or your family members notice these symptoms coming back, let your health care provider know right away. Follow these instructions at home: Activity Try to   get some form of exercise each day, such as walking. Try yoga, mindfulness, or other stress reduction techniques. Participate in group activities if you are able. Lifestyle Get enough sleep. Cut down on or stop using caffeine, tobacco,  alcohol, and any other harmful substances. Eat a healthy diet that includes plenty of vegetables, fruits, whole grains, low-fat dairy products, and lean protein. Limit foods that are high in solid fats, added sugar, or salt (sodium). General instructions Take over-the-counter and prescription medicines only as told by your health care provider. Keep all follow-up visits. It is important for your health care provider to check on your mood, behavior, and medicines. Your health care provider may need to make changes to your treatment. Where to find support Talking to others  Friends and family members can be sources of support and guidance. Talk to trusted friends or family members about your condition. Explain your symptoms and let them know that you are working with a health care provider to treat your depression. Tell friends and family how they can help. Finances Find mental health providers that fit with your financial situation. Talk with your health care provider if you are worried about access to food, housing, or medicine. Call your insurance company to learn about your co-pays and prescription plan. Where to find more information You can find support in your area from: Anxiety and Depression Association of America (ADAA): adaa.org Mental Health America: mentalhealthamerica.net National Alliance on Mental Illness: nami.org Contact a health care provider if: You stop taking your antidepressant medicines, and you have any of these symptoms: Nausea. Headache. Light-headedness. Chills and body aches. Not being able to sleep (insomnia). You or your friends and family think your depression is getting worse. Get help right away if: You have thoughts of hurting yourself or others. Get help right away if you feel like you may hurt yourself or others, or have thoughts about taking your own life. Go to your nearest emergency room or: Call 911. Call the National Suicide Prevention Lifeline at  1-800-273-8255 or 988. This is open 24 hours a day. Text the Crisis Text Line at 741741. This information is not intended to replace advice given to you by your health care provider. Make sure you discuss any questions you have with your health care provider. Document Revised: 06/15/2021 Document Reviewed: 06/15/2021 Elsevier Patient Education  2024 Elsevier Inc.  

## 2022-10-19 LAB — CBC WITH DIFFERENTIAL/PLATELET
Absolute Monocytes: 650 {cells}/uL (ref 200–950)
Basophils Absolute: 36 {cells}/uL (ref 0–200)
Basophils Relative: 0.4 %
Eosinophils Absolute: 374 {cells}/uL (ref 15–500)
Eosinophils Relative: 4.2 %
HCT: 44.3 % (ref 35.0–45.0)
Hemoglobin: 14.7 g/dL (ref 11.7–15.5)
Lymphs Abs: 2047 {cells}/uL (ref 850–3900)
MCH: 29.2 pg (ref 27.0–33.0)
MCHC: 33.2 g/dL (ref 32.0–36.0)
MCV: 87.9 fL (ref 80.0–100.0)
MPV: 10.2 fL (ref 7.5–12.5)
Monocytes Relative: 7.3 %
Neutro Abs: 5794 {cells}/uL (ref 1500–7800)
Neutrophils Relative %: 65.1 %
Platelets: 392 10*3/uL (ref 140–400)
RBC: 5.04 10*6/uL (ref 3.80–5.10)
RDW: 12.7 % (ref 11.0–15.0)
Total Lymphocyte: 23 %
WBC: 8.9 10*3/uL (ref 3.8–10.8)

## 2022-10-19 LAB — COMPLETE METABOLIC PANEL WITH GFR
AG Ratio: 1.9 (calc) (ref 1.0–2.5)
ALT: 21 U/L (ref 6–29)
AST: 22 U/L (ref 10–35)
Albumin: 4.3 g/dL (ref 3.6–5.1)
Alkaline phosphatase (APISO): 86 U/L (ref 37–153)
BUN: 12 mg/dL (ref 7–25)
CO2: 28 mmol/L (ref 20–32)
Calcium: 9.8 mg/dL (ref 8.6–10.4)
Chloride: 104 mmol/L (ref 98–110)
Creat: 0.84 mg/dL (ref 0.60–1.00)
Globulin: 2.3 g/dL (ref 1.9–3.7)
Glucose, Bld: 89 mg/dL (ref 65–99)
Potassium: 4.3 mmol/L (ref 3.5–5.3)
Sodium: 142 mmol/L (ref 135–146)
Total Bilirubin: 0.6 mg/dL (ref 0.2–1.2)
Total Protein: 6.6 g/dL (ref 6.1–8.1)
eGFR: 75 mL/min/{1.73_m2} (ref >=60)

## 2022-10-19 LAB — TSH: TSH: 0.76 m[IU]/L (ref 0.40–4.50)

## 2022-10-19 LAB — HEMOGLOBIN A1C W/OUT EAG: Hgb A1c MFr Bld: 5.9 %{Hb} — ABNORMAL HIGH (ref <5.7)

## 2022-10-19 LAB — LIPID PANEL
Cholesterol: 218 mg/dL — ABNORMAL HIGH (ref <200)
HDL: 53 mg/dL (ref >=50)
LDL Cholesterol (Calc): 125 mg/dL — ABNORMAL HIGH
Non-HDL Cholesterol (Calc): 165 mg/dL — ABNORMAL HIGH (ref <130)
Total CHOL/HDL Ratio: 4.1 (calc) (ref <5.0)
Triglycerides: 257 mg/dL — ABNORMAL HIGH (ref <150)

## 2022-10-21 ENCOUNTER — Ambulatory Visit: Admission: RE | Admit: 2022-10-21 | Payer: Medicare Other | Source: Ambulatory Visit

## 2022-10-21 DIAGNOSIS — Z1231 Encounter for screening mammogram for malignant neoplasm of breast: Secondary | ICD-10-CM

## 2022-10-25 NOTE — Progress Notes (Signed)
[facility] PREP Weekly Session  Patient Details  Name: Diana Wolfe MRN: [id number] Date of Birth: 11-18-1950 Age: 72 y.o. PCP: Lucky Cowboy, MD  Vitals:   10/25/22 1105  Weight: 156 lb (70.8 kg)     [facility] Weekly seesion - 10/25/22 1100       [facility] "PREP" Location   [facility] "PREP" Location [facility]      Weekly Session   Topic Discussed Calorie breakdown   [place] current recommendations for Carbs, proteins, fats; difference between simple/complex carbs   Minutes exercised this week 210 minutes    Classes attended to date 36             Zarius Furr B Linzy Laury 10/25/2022, 11:07 AM

## 2022-10-27 DIAGNOSIS — G4733 Obstructive sleep apnea (adult) (pediatric): Secondary | ICD-10-CM | POA: Diagnosis not present

## 2022-11-01 NOTE — Progress Notes (Signed)
[facility] PREP Weekly Session  Patient Details  Name: Diana Wolfe MRN: [id number] Date of Birth: 29-Oct-1950 Age: 72 y.o. PCP: Lucky Cowboy, MD  Vitals:   11/01/22 1101  Weight: 154 lb 8 oz (70.1 kg)     [facility] Weekly seesion - 11/01/22 1100       [facility] "PREP" Location   [facility] "PREP" Location [facility]      Weekly Session   Topic Discussed Other   How fit and strong survey completed; fit testing completed; appts make for final assessment visit on Thursday; review of goals and activity plan for next 90 days and PREP survey, to bring both to final assessment visit; membership talk w/Nick   Minutes exercised this week 150 minutes    Classes attended to date 64             Zaidee Rion B Burnett Spray 11/01/2022, 11:02 AM

## 2022-11-03 ENCOUNTER — Encounter: Payer: Self-pay | Admitting: Nurse Practitioner

## 2022-11-03 DIAGNOSIS — F988 Other specified behavioral and emotional disorders with onset usually occurring in childhood and adolescence: Secondary | ICD-10-CM

## 2022-11-03 MED ORDER — AMPHETAMINE-DEXTROAMPHETAMINE 20 MG PO TABS: ORAL_TABLET | ORAL | Status: DC

## 2022-11-03 MED ORDER — AMPHETAMINE-DEXTROAMPHETAMINE 20 MG PO TABS: ORAL_TABLET | ORAL | 0 refills | Status: DC

## 2022-11-03 NOTE — Progress Notes (Signed)
[facility] PREP Evaluation  Patient Details  Name: Diana Wolfe MRN: [id number] Date of Birth: [birth year] Age: 72 y.o. PCP: Lucky Cowboy, MD  Vitals:   11/03/22 0948  BP: (!) 148/72  Pulse: 84  SpO2: 97%  Weight: 154 lb 8 oz (70.1 kg)     [facility] Eval - 11/03/22 0900       [facility] "PREP" Location   [facility] "PREP" Location [facility]      Referral    Program Start Date 08/09/22    Program End Date 11/03/22      Measurement   Waist Circumference 36 inches    Waist Circumference End Program 35 inches    Hip Circumference 43 inches    Hip Circumference End Program 42.5 inches    Body fat 42.5 percent      Mobility and Daily Activities   I find it easy to walk up or down two or more flights of stairs. 4    I have no trouble taking out the trash. 4    I do housework such as vacuuming and dusting on my own without difficulty. 4    I can easily lift a gallon of milk (8lbs). 4    I can easily walk a mile. 4    I have no trouble reaching into high cupboards or reaching down to pick up something from the floor. 3    I do not have trouble doing out-door work such as Loss adjuster, chartered, raking leaves, or gardening. 3      Mobility and Daily Activities   I feel younger than my age. 2    I feel independent. 4    I feel energetic. 2    I live an active life.  3    I feel strong. 3    I feel healthy. 3    I feel active as other people my age. 4      How fit and strong are you.   Fit and Strong Total Score 47            Past Medical History:  Diagnosis Date   ADD (attention deficit disorder)    Anxiety    Depression    GERD (gastroesophageal reflux disease)    Hyperlipidemia    Hypertension    Hypothyroid    PONV (postoperative nausea and vomiting)    Vitamin D deficiency    Past Surgical History:  Procedure Laterality Date   BREAST BIOPSY Left 07/09/2019   CESAREAN SECTION     ESOPHAGOGASTRODUODENOSCOPY N/A 09/02/2015   Procedure: ESOPHAGOGASTRODUODENOSCOPY  (EGD);  Surgeon: Carman Ching, MD;  Location: [facility] ENDOSCOPY;  Service: Endoscopy;  Laterality: N/A;   ESOPHAGOGASTRODUODENOSCOPY N/A 06/05/2022   Procedure: ESOPHAGOGASTRODUODENOSCOPY (EGD);  Surgeon: Willis Modena, MD;  Location: [facility]OSCOPY;  Service: Gastroenterology;  Laterality: N/A;   FOOT SURGERY Right    FOREIGN BODY REMOVAL N/A 06/05/2022   Procedure: FOREIGN BODY REMOVAL;  Surgeon: Willis Modena, MD;  Location: [facility] ENDOSCOPY;  Service: Gastroenterology;  Laterality: N/A;   IM NAILING TIBIA Left 01/09/2001   Dr. Doristine Section, [place]   TONSILLECTOMY  2002   Social History   Tobacco Use  Smoking Status Never  Smokeless Tobacco Never  Wt loss: 3.1 lbs  Inches lost: 1.5 How fit and strong survey: 08/08/22: 36 11/03/22: 47 Education sessions completed: 11 Workout sessions completed: 10  Allycia Pitz B Mikaila Grunert 11/03/2022, 9:49 AM

## 2022-11-14 ENCOUNTER — Encounter (HOSPITAL_BASED_OUTPATIENT_CLINIC_OR_DEPARTMENT_OTHER): Payer: Self-pay

## 2022-11-14 ENCOUNTER — Encounter: Payer: Self-pay | Admitting: Nurse Practitioner

## 2022-11-15 ENCOUNTER — Other Ambulatory Visit: Payer: Self-pay | Admitting: Nurse Practitioner

## 2022-11-15 DIAGNOSIS — F40243 Fear of flying: Secondary | ICD-10-CM

## 2022-11-15 MED ORDER — ALPRAZOLAM 0.25 MG PO TABS
ORAL_TABLET | ORAL | 0 refills | Status: DC
Start: 2022-11-15 — End: 2022-12-12

## 2022-11-26 DIAGNOSIS — Z23 Encounter for immunization: Secondary | ICD-10-CM | POA: Diagnosis not present

## 2022-12-07 ENCOUNTER — Other Ambulatory Visit: Payer: Self-pay | Admitting: Nurse Practitioner

## 2022-12-07 ENCOUNTER — Telehealth: Payer: Self-pay | Admitting: Nurse Practitioner

## 2022-12-07 DIAGNOSIS — F902 Attention-deficit hyperactivity disorder, combined type: Secondary | ICD-10-CM

## 2022-12-07 MED ORDER — AMPHETAMINE-DEXTROAMPHETAMINE 20 MG PO TABS: ORAL_TABLET | ORAL | 0 refills | Status: DC

## 2022-12-07 NOTE — Telephone Encounter (Signed)
Requesting refill on Adderall. Pls send to [facility] [id number] - [place], [place] - [address]

## 2022-12-12 ENCOUNTER — Ambulatory Visit: Payer: Medicare Other | Admitting: Family Medicine

## 2022-12-12 ENCOUNTER — Encounter: Payer: Self-pay | Admitting: Family Medicine

## 2022-12-12 VITALS — BP 110/70 | HR 70 | Temp 98.2°F | Resp 16 | Ht 62.0 in | Wt 156.1 lb

## 2022-12-12 DIAGNOSIS — N951 Menopausal and female climacteric states: Secondary | ICD-10-CM | POA: Diagnosis not present

## 2022-12-12 DIAGNOSIS — I10 Essential (primary) hypertension: Secondary | ICD-10-CM | POA: Diagnosis not present

## 2022-12-12 DIAGNOSIS — G4733 Obstructive sleep apnea (adult) (pediatric): Secondary | ICD-10-CM

## 2022-12-12 DIAGNOSIS — G4700 Insomnia, unspecified: Secondary | ICD-10-CM | POA: Insufficient documentation

## 2022-12-12 DIAGNOSIS — E559 Vitamin D deficiency, unspecified: Secondary | ICD-10-CM

## 2022-12-12 DIAGNOSIS — R41840 Attention and concentration deficit: Secondary | ICD-10-CM | POA: Diagnosis not present

## 2022-12-12 DIAGNOSIS — E039 Hypothyroidism, unspecified: Secondary | ICD-10-CM

## 2022-12-12 DIAGNOSIS — R7303 Prediabetes: Secondary | ICD-10-CM

## 2022-12-12 LAB — VITAMIN D 25 HYDROXY (VIT D DEFICIENCY, FRACTURES): VITD: 67.42 ng/mL (ref 30.00–100.00)

## 2022-12-12 MED ORDER — PAROXETINE HCL 10 MG PO TABS
10.0000 mg | ORAL_TABLET | Freq: Every day | ORAL | 1 refills | Status: DC
Start: 2022-12-12 — End: 2022-12-28

## 2022-12-12 NOTE — Patient Instructions (Addendum)
A few things to remember from today's visit:  Obstructive sleep apnea - Plan: Ambulatory referral to Pulmonology  Hypothyroidism, unspecified type  Attention or concentration deficit  Vitamin D deficiency, unspecified - Plan: VITAMIN D 25 Hydroxy (Vit-D Deficiency, Fractures)  Hot flashes due to menopause - Plan: PARoxetine (PAXIL) 10 MG tablet For hot flashes you can try Paroxetine at bedtime, it is similar to Escitalopram. No other change today. We can sign a med contract for the Adderall next visit.  If you need refills for medications you take chronically, please call your pharmacy. Do not use My Chart to request refills or for acute issues that need immediate attention. If you send a my chart message, it may take a few days to be addressed, specially if I am not in the office.  Please be sure medication list is accurate. If a new problem present, please set up appointment sooner than planned today.

## 2022-12-12 NOTE — Assessment & Plan Note (Addendum)
Chronic. Having symptoms a couple times per week. We discussed treatment options but because she is going to start Paroxetine for hot flashes, we decided to hold on Trazodone. I do not recommend resuming Alprazolam. If needed we can consider referral to psychiatrist. Continue good sleep hygiene.

## 2022-12-12 NOTE — Assessment & Plan Note (Addendum)
Chronic. I do not recommend hormonal therapy at this time. Effexor caused BP elevation in the past. We discussed some non hormonal options, she agrees with trying Paroxetine 10 mg daily at night. She has multiple med allergies, has tried SSRI's in the past and on allergy med list with "intolerance." No hx of anaphylaxis reaction, she is willing to try medication,understands the risk.

## 2022-12-12 NOTE — Assessment & Plan Note (Signed)
Currently she is treating problem with a mouth piece she wears at night. She is interested ing trying a CPAP.Last sleep study 20+ years ago. Referral to pulmonologist placed.

## 2022-12-12 NOTE — Assessment & Plan Note (Signed)
Currently on Adderall 20 mg tab 1/2-1 tab daily as needed. PDMP reviewed. Will continue prescribing medication as far as problem is stable. Med contract to be signed next visit.

## 2022-12-12 NOTE — Assessment & Plan Note (Signed)
BP today adequately controlled. On prn Hydralazine 10 mg daily prn. She follows with cardiologist.

## 2022-12-12 NOTE — Assessment & Plan Note (Signed)
Currently on Ergocalciferol 50,000 U 3 times per week. Further recommendations according to 25 OH vit D result.

## 2022-12-12 NOTE — Assessment & Plan Note (Addendum)
Problem has been adequately controlled, last TSH 0.7 in 09/2022. Continue  levothyroxine 75 mcg on Monday, Wednesday, Friday and 112.5 mcg on Tuesday, Thursday.

## 2022-12-12 NOTE — Progress Notes (Signed)
HPI: Diana Wolfe is a 72 y.o. female with a PMHx significant for HTN, OSA, GERD, hypothyroidism, B12 deficiency, HLD, iron deficiency anemia, vertigo, and anxiety/depression, who is here today to establish care.  Former PCP: Lucky Cowboy, MD. Last preventive routine visit: More than one year ago.   Chronic medical problems:   Hypertension:  Medications: She doesn't take any daily medications. She has hydralazine 10 mg tablets, and has been instructed to take 1/2 tablet if her systolic BP goes over 190. She says she rarely, if ever, has to take it.  BP readings at home: She checks regularly at home. She says her BP fluctuates frequently.  Side effects: N/A Exercise: She states she walks regularly and has started going to the gym.  Her next appointment with cardiology is in 01/2023. She sees them twice per year.   Negative for unusual or severe headache, visual changes, exertional chest pain, dyspnea,  focal weakness, or edema.  Lab Results  Component Value Date   NA 142 10/18/2022   CL 104 10/18/2022   K 4.3 10/18/2022   CO2 28 10/18/2022   BUN 12 10/18/2022   CREATININE 0.84 10/18/2022   EGFR 75 10/18/2022   CALCIUM 9.8 10/18/2022   ALBUMIN 4.1 05/23/2016   GLUCOSE 89 10/18/2022   Hyperlipidemia: She is not currently on pharmacologic treatment. She is allergic to pravastatin.  Lab Results  Component Value Date   CHOL 218 (H) 10/18/2022   HDL 53 10/18/2022   LDLCALC 125 (H) 10/18/2022   LDLDIRECT 138.2 04/25/2008   TRIG 257 (H) 10/18/2022   CHOLHDL 4.1 10/18/2022   Prediabetes: Last HgA1C 5.9 in 09/2022.  Hypothyroidism:  She has been taking levothyroxine 75 mcg on Monday, Wednesday, Friday and 112.5 mcg on Tuesday, Thursday for many years.  Lab Results  Component Value Date   TSH 0.76 10/18/2022   ADD: She was dx'ed with ADD ~25 years ago by psychiatrist.  She takes Adderall 20 mg when driving long distances or working. On average, she takes it about  4x per week.   Anxiety/depression: She is not currently on any medication for depression. She has taken Effexor and Buspar before, but stopped them because they raised her blood pressure. She has also taken Escitalopram before, and believes she was allergic to it. She has seen psychiatry in the past, but not for a couple of years.   GERD: She takes omeprazole 40 mg about 4x per week for acid reflux.   -She follows with orthopedics for arthritis as well as several bone fractures.  She has a history of pulmonary embolism post tibial fracture.  She takes diclofenac 75 mg daily for joint pain.   -She takes vitamin D supplementation, Ergocalciferol 50,000 3 times per week. Last 25 OH vit D a year ago.  -She took meclizine 25-mg a couple of times a few months ago for episodes of vertigo, but has not had an episode or taken it since.   Concerns today:   Sleep issues:  She also mentions she has trouble falling asleep and/or waking up at night 2-3x per week. She says she sleeps 4-6 hours per night.  She has a history of OSA. She does not use a CPAP, although she is considering it. She does have a mouth piece prescribed by dentist. Requesting sleep study. Last sleep study 20+ years ago.  She mentions she used to take alprazolam for sleep. She also took gabapentin but did not tolerate well. She denies any changes to  her sleep when she takes her Adderall.   She has been having hot flashes at night that also interfere with her sleep. She asks about being prescribed estradiol.   She mentions she gets shingles on her back about once per year. She asks about the shingles vaccine. She had Zoster vaccine years ago.  She asks whether she needs an RSV vaccine.   Review of Systems  Constitutional:  Negative for activity change, appetite change and fever.  HENT:  Negative for nosebleeds and trouble swallowing.   Eyes:  Negative for redness and visual disturbance.  Respiratory:  Negative for cough,  shortness of breath and wheezing.   Cardiovascular:  Negative for chest pain, palpitations and leg swelling.  Gastrointestinal:  Negative for abdominal pain, nausea and vomiting.       Negative for changes in bowel habits.  Genitourinary:  Negative for decreased urine volume and hematuria.  Skin:  Negative for rash.  Neurological:  Negative for syncope and facial asymmetry.  Psychiatric/Behavioral:  Positive for sleep disturbance. Negative for confusion and hallucinations. The patient is nervous/anxious.   See other pertinent positives and negatives in HPI.  Current Outpatient Medications on File Prior to Visit  Medication Sig Dispense Refill   amphetamine-dextroamphetamine (ADDERALL) 20 MG tablet Take 1/2 - 1 tab daily. Or 1/2 tab BID with driving long distances 30 tablet 0   aspirin 81 MG chewable tablet 3-4 days a week     cetirizine (ZYRTEC) 10 MG tablet PRN     Cyanocobalamin (B-12) 1000 MCG TABS Take by mouth.     diclofenac (VOLTAREN) 75 MG EC tablet Take 75 mg by mouth 2 (two) times daily.     hydrALAZINE (APRESOLINE) 10 MG tablet Take half tablet as needed for systolic blood pressure (top number) more than 190. If blood pressure remains more than 190 one hour after taking half tablet, may take additional half tablet. 30 tablet 1   levothyroxine (SYNTHROID) 75 MCG tablet TAKE 1 AND 1/2 TABLETS BY MOUTH ON MONDAYS, WEDNESDAYS AND FRIDAYS THEN TAKE 1 TABLET ON OTHER DAYS OF THE WEEK. TAKE ON EMPTY STOMACH WITH ONLY WATER FOR 30 MINUTES AND NO ANTACIDS, CALCIUM OR MAGNESIUM FOR 4 HOURS AND AVOID BIOTIN 135 tablet 1   meclizine (ANTIVERT) 25 MG tablet 1/2-1 pill up to 3 times daily for motion sickness/dizziness 30 tablet 0   Multiple Vitamin (MULTIVITAMIN) capsule Take 1 capsule by mouth daily.     omeprazole (PRILOSEC) 40 MG capsule Take 1 capsule (40 mg total) by mouth daily. TAKE ONE CAPSULE BY MOUTH DAILY TO PREVENT HEARTBURN AND INDIGESTION (Patient taking differently: Take 40 mg by  mouth as needed. TAKE ONE CAPSULE BY MOUTH DAILY TO PREVENT HEARTBURN AND INDIGESTION) 90 capsule 1   ondansetron (ZOFRAN-ODT) 4 MG disintegrating tablet Take 1 tablet (4 mg total) by mouth every 8 (eight) hours as needed for nausea or vomiting. 20 tablet 0   vitamin C (ASCORBIC ACID) 500 MG tablet Take 1,200 mg by mouth daily.     Vitamin D, Ergocalciferol, (DRISDOL) 1.25 MG (50000 UNIT) CAPS capsule TAKE 1 CAPSULE BY MOUTH 3 TIMES A WEEK ON MONDAY- WEDNESDAY- FRIDAY FOR SEVERE VITAMIN D. DEFICIENCY 36 capsule 3   No current facility-administered medications on file prior to visit.    Past Medical History:  Diagnosis Date   ADD (attention deficit disorder)    Anxiety    Depression    GERD (gastroesophageal reflux disease)    Hyperlipidemia    Hypertension  Hypothyroid    PONV (postoperative nausea and vomiting)    Vitamin D deficiency    Allergies  Allergen Reactions   Levofloxacin     Neck sweling/difficult breathing   Buspirone    Cefprozil    Codeine     REACTION: vomiting   Hydrocodone-Acetaminophen    Morphine    Nitrofuran Derivatives Itching   Pravastatin Swelling   Lexapro [Escitalopram Oxalate] Other (See Comments)    Tapping/abnormal movements    Family History  Problem Relation Age of Onset   Breast cancer Mother        64s   Cancer Mother 33       Breast   Heart disease Father    Hypertension Father    Breast cancer Cousin        twice 17, 17    Social History   Socioeconomic History   Marital status: Divorced    Spouse name: Not on file   Number of children: Not on file   Years of education: Not on file   Highest education level: Bachelor's degree (e.g., BA, AB, BS)  Occupational History   Not on file  Tobacco Use   Smoking status: Never   Smokeless tobacco: Never  Substance and Sexual Activity   Alcohol use: Yes    Comment: occasional   Drug use: No   Sexual activity: Not on file  Other Topics Concern   Not on file  Social History  Narrative   Not on file   Social Determinants of Health   Financial Resource Strain: Low Risk  (12/11/2022)   Overall Financial Resource Strain (CARDIA)    Difficulty of Paying Living Expenses: Not very hard  Food Insecurity: Food Insecurity Present (12/11/2022)   Hunger Vital Sign    Worried About Running Out of Food in the Last Year: Sometimes true    Ran Out of Food in the Last Year: Patient declined  Transportation Needs: No Transportation Needs (12/11/2022)   PRAPARE - Administrator, Civil Service (Medical): No    Lack of Transportation (Non-Medical): No  Physical Activity: Sufficiently Active (12/11/2022)   Exercise Vital Sign    Days of Exercise per Week: 5 days    Minutes of Exercise per Session: 30 min  Stress: Stress Concern Present (12/11/2022)   [employer] of Occupational Health - Occupational Stress Questionnaire    Feeling of Stress : Rather much  Social Connections: Socially Isolated (12/11/2022)   Social Connection and Isolation Panel [NHANES]    Frequency of Communication with Friends and Family: Never    Frequency of Social Gatherings with Friends and Family: Never    Attends Religious Services: More than 4 times per year    Active Member of Clubs or Organizations: No    Attends Banker Meetings: Not on file    Marital Status: Divorced    Vitals:   12/12/22 0918  BP: 110/70  Pulse: 70  Resp: 16  Temp: 98.2 F (36.8 C)  SpO2: 96%   Body mass index is 28.56 kg/m.  Physical Exam Vitals and nursing note reviewed.  Constitutional:      General: She is not in acute distress.    Appearance: She is well-developed.  HENT:     Head: Normocephalic and atraumatic.     Mouth/Throat:     Mouth: Mucous membranes are moist.     Pharynx: Oropharynx is clear.  Eyes:     Conjunctiva/sclera: Conjunctivae normal.  Cardiovascular:  Rate and Rhythm: Normal rate and regular rhythm.     Pulses:          Posterior tibial pulses  are 2+ on the right side and 2+ on the left side.     Heart sounds: No murmur heard. Pulmonary:     Effort: Pulmonary effort is normal. No respiratory distress.     Breath sounds: Normal breath sounds.  Abdominal:     Palpations: Abdomen is soft. There is no hepatomegaly or mass.     Tenderness: There is no abdominal tenderness.  Musculoskeletal:     Right lower leg: No edema.     Left lower leg: No edema.  Lymphadenopathy:     Cervical: No cervical adenopathy.  Skin:    General: Skin is warm.     Findings: No erythema or rash.  Neurological:     General: No focal deficit present.     Mental Status: She is alert and oriented to person, place, and time.     Cranial Nerves: No cranial nerve deficit.     Gait: Gait normal.  Psychiatric:        Mood and Affect: Affect normal.   ASSESSMENT AND PLAN:  Diana Wolfe was seen today to establish care.   Insomnia, unspecified type Assessment & Plan: Chronic. Having symptoms a couple times per week. We discussed treatment options but because she is going to start Paroxetine for hot flashes, we decided to hold on Trazodone. I do not recommend resuming Alprazolam. If needed we can consider referral to psychiatrist. Continue good sleep hygiene.   Obstructive sleep apnea Assessment & Plan: Currently she is treating problem with a mouth piece she wears at night. She is interested ing trying a CPAP.Last sleep study 20+ years ago. Referral to pulmonologist placed.  Orders: -     Ambulatory referral to Pulmonology  Attention or concentration deficit Assessment & Plan: Currently on Adderall 20 mg tab 1/2-1 tab daily as needed. PDMP reviewed. Will continue prescribing medication as far as problem is stable. Med contract to be signed next visit.   Hypothyroidism, unspecified type Assessment & Plan: Problem has been adequately controlled, last TSH 0.7 in 09/2022. Continue  levothyroxine 75 mcg on Monday, Wednesday, Friday and 112.5 mcg  on Tuesday, Thursday.   Vitamin D deficiency, unspecified Assessment & Plan: Currently on Ergocalciferol 50,000 U 3 times per week. Further recommendations according to 25 OH vit D result.   Orders: -     VITAMIN D 25 Hydroxy (Vit-D Deficiency, Fractures); Future  Hot flashes due to menopause Assessment & Plan: Chronic. I do not recommend hormonal therapy at this time. Effexor caused BP elevation in the past. We discussed some non hormonal options, she agrees with trying Paroxetine 10 mg daily at night. She has multiple med allergies, has tried SSRI's in the past and on allergy med list with "intolerance." No hx of anaphylaxis reaction, she is willing to try medication,understands the risk.  Orders: -     PARoxetine HCl; Take 1 tablet (10 mg total) by mouth daily.  Dispense: 30 tablet; Refill: 1  Primary hypertension Assessment & Plan: BP today adequately controlled. On prn Hydralazine 10 mg daily prn. She follows with cardiologist.   Prediabetes Assessment & Plan: Last HgA1C 5.9 in 09/2022. Continue a healthy life style for diabetes prevention.   I spent a total of 56 minutes in both face to face and non face to face activities for this visit on the date of this encounter. During  this time history was obtained and documented, examination was performed, prior labs  reviewed, and assessment/plan discussed.  Return in about 4 months (around 04/07/2023) for chronic problems.  I, Rolla Etienne Wierda, acting as a scribe for Anika Shore [place], MD., have documented all relevant documentation on the behalf of Sukhraj Esquivias [place], MD, as directed by  Jermarion Poffenberger [place], MD while in the presence of Haeli Gerlich [place], MD.   I, Aniket Paye [place], MD, have reviewed all documentation for this visit. The documentation on 12/12/22 for the exam, diagnosis, procedures, and orders are all accurate and complete.  Axton Cihlar G. [place], MD  [facility]. [place] office.

## 2022-12-12 NOTE — Assessment & Plan Note (Signed)
Last HgA1C 5.9 in 09/2022. Continue a healthy life style for diabetes prevention.

## 2022-12-24 ENCOUNTER — Encounter: Payer: Self-pay | Admitting: Family Medicine

## 2022-12-28 ENCOUNTER — Other Ambulatory Visit: Payer: Self-pay | Admitting: Family Medicine

## 2022-12-28 NOTE — Progress Notes (Signed)
12/29/22- 72 yoF never smoker for sleep evaluation courtesy of Dr Betty [place] with concern of OSA. Lives alone.  Remote sleep study (2009/ [facility] 5/hr). Using oral appliance from [facility]. Her phone app indicates she is not snoring with OAP, but it is giving her a sore R jaw/ear.  Had remote UPPP/ septoplasty. Would like to consider CPAP. Less tired if she wears OAP. Occ tea. Uses Adderall to help with driving. She works as a Designer, multimedia and drives a lot. Medical problem list includes Insomnia, HTN, Allergic Rhinitis, GERD, Diverticulitis, Hypothyroid, Urticaria, hx Pulmonary Embolism, ADD, Depression,  -Adderall 20,  Epworth score-6 Body weight today-156 lbs  Prior to Admission medications   Medication Sig Start Date End Date Taking? Authorizing Provider  amphetamine-dextroamphetamine (ADDERALL) 20 MG tablet Take 1/2 - 1 tab daily. Or 1/2 tab BID with driving long distances 29/56/21  Yes Cranford, [name], NP  aspirin 81 MG chewable tablet 3-4 days a week   Yes [provider]  cetirizine (ZYRTEC) 10 MG tablet PRN   Yes [provider]  Cyanocobalamin (B-12) 1000 MCG TABS Take by mouth.   Yes [provider]  diclofenac (VOLTAREN) 75 MG EC tablet Take 75 mg by mouth 2 (two) times daily.   Yes [provider]  hydrALAZINE (APRESOLINE) 10 MG tablet Take half tablet as needed for systolic blood pressure (top number) more than 190. If blood pressure remains more than 190 one hour after taking half tablet, may take additional half tablet. 07/07/22  Yes Alver Sorrow, NP  levothyroxine (SYNTHROID) 75 MCG tablet TAKE 1 AND 1/2 TABLETS BY MOUTH ON MONDAYS, WEDNESDAYS AND FRIDAYS THEN TAKE 1 TABLET ON OTHER DAYS OF THE WEEK. TAKE ON EMPTY STOMACH WITH ONLY WATER FOR 30 MINUTES AND NO ANTACIDS, CALCIUM OR MAGNESIUM FOR 4 HOURS AND AVOID BIOTIN 09/22/22  Yes Raynelle Dick, NP  meclizine (ANTIVERT) 25 MG tablet 1/2-1 pill up to 3 times daily for motion  sickness/dizziness 09/30/22  Yes Cranford, Archie Patten, NP  Multiple Vitamin (MULTIVITAMIN) capsule Take 1 capsule by mouth daily.   Yes [provider]  omeprazole (PRILOSEC) 40 MG capsule Take 1 capsule (40 mg total) by mouth daily. TAKE ONE CAPSULE BY MOUTH DAILY TO PREVENT HEARTBURN AND INDIGESTION Patient taking differently: Take 40 mg by mouth as needed. TAKE ONE CAPSULE BY MOUTH DAILY TO PREVENT HEARTBURN AND INDIGESTION 06/05/22  Yes Willis Modena, MD  ondansetron (ZOFRAN-ODT) 4 MG disintegrating tablet Take 1 tablet (4 mg total) by mouth every 8 (eight) hours as needed for nausea or vomiting. 09/30/22  Yes Cranford, Archie Patten, NP  vitamin C (ASCORBIC ACID) 500 MG tablet Take 1,200 mg by mouth daily.   Yes [provider]  Vitamin D, Ergocalciferol, (DRISDOL) 1.25 MG (50000 UNIT) CAPS capsule TAKE 1 CAPSULE BY MOUTH 3 TIMES A WEEK ON MONDAY- WEDNESDAY- FRIDAY FOR SEVERE VITAMIN D. DEFICIENCY 02/07/22  Yes Raynelle Dick, NP   Past Medical History:  Diagnosis Date   ADD (attention deficit disorder)    Anxiety    Depression    GERD (gastroesophageal reflux disease)    Hyperlipidemia    Hypertension    Hypothyroid    PONV (postoperative nausea and vomiting)    Vitamin D deficiency    Past Surgical History:  Procedure Laterality Date   BREAST BIOPSY Left 07/09/2019   CESAREAN SECTION     ESOPHAGOGASTRODUODENOSCOPY N/A 09/02/2015   Procedure: ESOPHAGOGASTRODUODENOSCOPY (EGD);  Surgeon: Carman Ching, MD;  Location: [facility] ENDOSCOPY;  Service: Endoscopy;  Laterality: N/A;   ESOPHAGOGASTRODUODENOSCOPY N/A 06/05/2022   Procedure: ESOPHAGOGASTRODUODENOSCOPY (EGD);  Surgeon: Willis Modena, MD;  Location: [facility] ENDOSCOPY;  Service: Gastroenterology;  Laterality: N/A;   FOOT SURGERY Right    FOREIGN BODY REMOVAL N/A 06/05/2022   Procedure: FOREIGN BODY REMOVAL;  Surgeon: Willis Modena, MD;  Location: [facility] ENDOSCOPY;  Service: Gastroenterology;  Laterality: N/A;   IM NAILING TIBIA Left  01/09/2001   Dr. Doristine Section, [facility]   TONSILLECTOMY  2002   Family History  Problem Relation Age of Onset   Breast cancer Mother        31s   Cancer Mother 43       Breast   Heart disease Father    Hypertension Father    Breast cancer Cousin        twice 9, 63   Social History   Socioeconomic History   Marital status: Divorced    Spouse name: Not on file   Number of children: Not on file   Years of education: Not on file   Highest education level: Bachelor's degree (e.g., BA, AB, BS)  Occupational History   Not on file  Tobacco Use   Smoking status: Never   Smokeless tobacco: Never  Substance and Sexual Activity   Alcohol use: Yes    Comment: occasional   Drug use: No   Sexual activity: Not on file  Other Topics Concern   Not on file  Social History Narrative   Not on file   Social Determinants of Health   Financial Resource Strain: Low Risk  (12/11/2022)   Overall Financial Resource Strain (CARDIA)    Difficulty of Paying Living Expenses: Not very hard  Food Insecurity: Food Insecurity Present (12/11/2022)   Hunger Vital Sign    Worried About Running Out of Food in the Last Year: Sometimes true    Ran Out of Food in the Last Year: Patient declined  Transportation Needs: No Transportation Needs (12/11/2022)   PRAPARE - Administrator, Civil Service (Medical): No    Lack of Transportation (Non-Medical): No  Physical Activity: Sufficiently Active (12/11/2022)   Exercise Vital Sign    Days of Exercise per Week: 5 days    Minutes of Exercise per Session: 30 min  Stress: Stress Concern Present (12/11/2022)   [employer] of Occupational Health - Occupational Stress Questionnaire    Feeling of Stress : Rather much  Social Connections: Socially Isolated (12/11/2022)   Social Connection and Isolation Panel [NHANES]    Frequency of Communication with Friends and Family: Never    Frequency of Social Gatherings with Friends and Family: Never     Attends Religious Services: More than 4 times per year    Active Member of [employer] or Organizations: No    Attends Engineer, structural: Not on file    Marital Status: Divorced  Intimate Partner Violence: Not At Risk (06/28/2022)   Humiliation, Afraid, Rape, and Kick questionnaire    Fear of Current or Ex-Partner: No    Emotionally Abused: No    Physically Abused: No    Sexually Abused: No   ROS-see HPI   + = positive Constitutional:    weight loss, night sweats, fevers, chills, fatigue, lassitude. HEENT:    headaches, + difficulty swallowing, tooth/dental problems, sore throat,       sneezing, itching, +ear ache, nasal congestion, post nasal drip, snoring CV:    chest pain, orthopnea, PND, swelling in lower extremities, anasarca, dizziness,  palpitations Resp:   +shortness of breath with exertion or at rest.                productive cough,   non-productive cough, coughing up of blood.              change in color of mucus.  wheezing.   Skin:    rash or lesions. GI:   heartburn,+ indigestion, abdominal pain, nausea, vomiting, diarrhea,                 change in bowel habits, loss of appetite GU: dysuria, change in color of urine, no urgency or frequency.   flank pain. MS:  + joint pain, stiffness, decreased range of motion, back pain. Neuro-     nothing unusual Psych:  change in mood or affect.  depression or +anxiety.   memory loss.  OBJ- Physical Exam General- Alert, Oriented, Affect-appropriate, Distress- none acute Skin- rash-none, lesions- none, excoriation- none Lymphadenopathy- none Head- atraumatic            Eyes- Gross vision intact, PERRLA, conjunctivae and secretions clear            Ears- Hearing, canals-normal            Nose- Clear, no-Septal dev, mucus, polyps, erosion, perforation             Throat- Mallampati II/ +UPPP , mucosa clear , drainage- none, tonsils- atrophic Neck- flexible , trachea midline, no stridor , thyroid nl, carotid no bruit Chest -  symmetrical excursion , unlabored           Heart/CV- RRR , no murmur , no gallop  , no rub, nl s1 s2                           - JVD- none , edema- none, stasis changes- none, varices- none           Lung- clear to P&A, wheeze- none, cough- none , dullness-none, rub- none           Chest wall-  Abd-  Br/ Gen/ Rectal- Not done, not indicated Extrem- cyanosis- none, clubbing, none, atrophy- none, strength- nl Neuro- grossly intact to observation

## 2022-12-29 ENCOUNTER — Encounter: Payer: Self-pay | Admitting: Internal Medicine

## 2022-12-29 ENCOUNTER — Ambulatory Visit: Payer: Medicare Other | Admitting: Internal Medicine

## 2022-12-29 VITALS — BP 124/82 | HR 89 | Ht 62.0 in | Wt 156.2 lb

## 2022-12-29 DIAGNOSIS — G4733 Obstructive sleep apnea (adult) (pediatric): Secondary | ICD-10-CM

## 2022-12-29 DIAGNOSIS — R0681 Apnea, not elsewhere classified: Secondary | ICD-10-CM

## 2022-12-29 NOTE — Patient Instructions (Signed)
Order- schedule home sleep test   dx OSA  Please call me about 2 weeks after your sleep test for results and recommendations

## 2023-01-02 ENCOUNTER — Encounter: Payer: Self-pay | Admitting: Internal Medicine

## 2023-01-02 NOTE — Assessment & Plan Note (Signed)
Has been using oral appliance, but it hurts her jaw. Interested in CPAP.Marland Kitchen Plan-schedule HST then anticipate ordering CPAP if appropriate

## 2023-01-08 DIAGNOSIS — R0681 Apnea, not elsewhere classified: Secondary | ICD-10-CM

## 2023-01-08 DIAGNOSIS — G4733 Obstructive sleep apnea (adult) (pediatric): Secondary | ICD-10-CM | POA: Diagnosis not present

## 2023-01-09 ENCOUNTER — Other Ambulatory Visit: Payer: Self-pay | Admitting: Nurse Practitioner

## 2023-01-09 ENCOUNTER — Telehealth: Payer: Self-pay | Admitting: Nurse Practitioner

## 2023-01-09 DIAGNOSIS — F902 Attention-deficit hyperactivity disorder, combined type: Secondary | ICD-10-CM

## 2023-01-09 MED ORDER — AMPHETAMINE-DEXTROAMPHETAMINE 20 MG PO TABS: ORAL_TABLET | ORAL | 0 refills | Status: DC

## 2023-01-09 NOTE — Telephone Encounter (Signed)
Patient is requesting a refill on Adderall to [facility] on [address]

## 2023-01-24 ENCOUNTER — Encounter: Payer: Self-pay | Admitting: Internal Medicine

## 2023-01-24 ENCOUNTER — Encounter: Payer: Self-pay | Admitting: Nurse Practitioner

## 2023-01-24 NOTE — Telephone Encounter (Signed)
Please try and get her results. Thank you!

## 2023-01-25 NOTE — Telephone Encounter (Signed)
It is pending Dr. Roxy Cedar signature

## 2023-01-30 ENCOUNTER — Ambulatory Visit: Payer: Medicare Other | Attending: Internal Medicine | Admitting: Internal Medicine

## 2023-01-30 ENCOUNTER — Encounter: Payer: Self-pay | Admitting: Internal Medicine

## 2023-01-30 VITALS — BP 164/72 | HR 86 | Ht 62.0 in | Wt 154.0 lb

## 2023-01-30 DIAGNOSIS — I700 Atherosclerosis of aorta: Secondary | ICD-10-CM

## 2023-01-30 DIAGNOSIS — G4733 Obstructive sleep apnea (adult) (pediatric): Secondary | ICD-10-CM | POA: Diagnosis not present

## 2023-01-30 DIAGNOSIS — I10 Essential (primary) hypertension: Secondary | ICD-10-CM | POA: Diagnosis not present

## 2023-01-30 DIAGNOSIS — I701 Atherosclerosis of renal artery: Secondary | ICD-10-CM

## 2023-01-30 DIAGNOSIS — R030 Elevated blood-pressure reading, without diagnosis of hypertension: Secondary | ICD-10-CM

## 2023-01-30 NOTE — Telephone Encounter (Addendum)
Dr. Maple Hudson has reviewed the sleep study:  Her HST showed moderate obstructive sleep apnea, averaging 21 apneas/ hour with low oxygen.  CY  I recommend we order new DDME, new CPAP auto 5-15, mask of choice, humidifier, supplies, AirView/ card. She will need ov in 31-90 days. Thanks   -------------------------------------------------------------------------------------------------------------------------------------------------  I called and spoke with patient, advised of recommendations per Dr. Maple Hudson.  She had questions and requested an office visit.  She will be seen on 02/07/23 at 4 pm, advised to arrive by 3:45 pm for check in.  Nothing further needed.

## 2023-01-30 NOTE — Progress Notes (Signed)
Cardiology Office Note:  .   Date:  01/30/2023  ID:  Diana Wolfe, DOB Nov 16, 1950, MRN [id number] PCP: Lucky Cowboy, MD  [facility] Providers Cardiologist:  Parke Poisson, MD    History of Present Illness: .   Diana Wolfe is a 72 y.o. female.  Discussed the use of AI scribe software for clinical note transcription with the patient, who gave verbal consent to proceed.  History of Present Illness   The patient, with a history of hypertension, aortic atherosclerosis, and high cholesterol, presents with concerns about high blood pressure. She reports that her blood pressure has been normal at recent doctor visits but has occasionally spiked to 180, which she attributes to stress and work-related issues. She has been prescribed hydralazine for these instances but has not yet needed to take it.  The patient also reports frequent urination, particularly during long car rides, and has to rush to the bathroom upon arriving home. This issue has been ongoing for years but has worsened in the past three to four months. She denies any incontinence but expresses concern about the potential for accidents if she does not reach the bathroom in time.  The patient also mentions ongoing menopausal symptoms, including night sweats, despite being postmenopausal for over a decade. She has not taken any hormone replacement therapy for these symptoms.  Lastly, the patient has a history of sleep apnea and currently uses an oral device, which she finds uncomfortable. She has had surgery for this condition in the past, which did not alleviate the symptoms. She expresses fatigue and believes that her sleep apnea may be contributing to her high blood pressure.        ROS: negative except per HPI above.  Studies Reviewed: Marland Kitchen   EKG Interpretation Date/Time:  Monday January 30 2023 13:32:28 EST Ventricular Rate:  86 PR Interval:  132 QRS Duration:  76 QT Interval:  364 QTC  Calculation: 435 R Axis:   29  Text Interpretation: Normal sinus rhythm Right atrial enlargement Nonspecific T wave abnormality Confirmed by Weston Brass ([id number]) on 01/30/2023 2:04:53 PM    Results   DIAGNOSTIC  Sleep study: +OSA     Risk Assessment/Calculations:     HYPERTENSION CONTROL Vitals:   01/30/23 1328 01/30/23 1419  BP: (!) 186/60 (!) 164/72    The patient's blood pressure is elevated above target today.  In order to address the patient's elevated BP: Blood pressure will be monitored at home to determine if medication changes need to be made.; The blood pressure is usually elevated in clinic.  Blood pressures monitored at home have been optimal.          Physical Exam:   VS:  BP (!) 164/72 (BP Location: Right Arm, Patient Position: Sitting)   Pulse 86   Ht 5\' 2"  (1.575 m)   Wt 154 lb (69.9 kg)   SpO2 97%   BMI 28.17 kg/m    Wt Readings from Last 3 Encounters:  01/30/23 154 lb (69.9 kg)  12/29/22 156 lb 3.2 oz (70.9 kg)  12/12/22 156 lb 2 oz (70.8 kg)     Physical Exam   CHEST: Lungs clear to auscultation. CARDIOVASCULAR: Heart sounds normal.     GEN: Well nourished, well developed in no acute distress NECK: No JVD; No carotid bruits CARDIAC: RRR, no murmurs, rubs, gallops RESPIRATORY:  Clear to auscultation without rales, wheezing or rhonchi  ABDOMEN: Soft, non-tender, non-distended EXTREMITIES:  No edema; No deformity   ASSESSMENT  AND PLAN: .    1. Primary hypertension     Assessment and Plan    Hypertension Labile blood pressure with occasional readings up to 180s. Patient has not yet used prescribed hydralazine. -Continue current management and use hydralazine as needed for high blood pressure readings. -Consider the impact of stress and sleep apnea on blood pressure control.  Aortic Atherosclerosis Discussed the inconsistent use of aspirin and its potential benefits and risks. -Consider daily aspirin use for more consistent platelet  inhibition or discontinuation if stomach issues persist.  Frequent Urination Increased frequency of urination, particularly before work. No incontinence. Discussed potential causes including pelvic floor dysfunction and bladder irritants. -Consider pelvic floor strengthening exercises and review potential bladder irritants.  Sleep Apnea Patient has known sleep apnea and is not consistently using oral device due to discomfort. Discussed potential impact on blood pressure and overall fatigue. -Encourage CPAP or Inspire device as indicated by [employer].  Menopausal Symptoms Persistent night sweats. Discussed potential use of topical hormone replacement therapy. -Consider consultation with OB/GYN for potential hormone replacement therapy, no strong cardiovascular contraindication.  General Health Maintenance -Continue with recommended vaccinations including flu and pneumonia shots.  Follow-up in 6 months                 Weston Brass, MD, [facility]  [facility]

## 2023-01-30 NOTE — Patient Instructions (Signed)
 Medication Instructions:  Your physician recommends that you continue on your current medications as directed. Please refer to the Current Medication list given to you today.  *If you need a refill on your cardiac medications before your next appointment, please call your pharmacy*  Lab Work: None  Testing/Procedures: None  Follow-Up: At [facility] you and your health needs are our priority.  As part of our continuing mission to provide you with exceptional heart care, we have created designated Provider Care Teams.  These Care Teams include your primary Cardiologist (physician) and Advanced Practice Providers (APPs -  Physician Assistants and Nurse Practitioners) who all work together to provide you with the care you need, when you need it.  Your next appointment:   6 month(s)  Provider:   Parke Poisson, MD

## 2023-02-06 ENCOUNTER — Encounter: Payer: Self-pay | Admitting: Nurse Practitioner

## 2023-02-06 ENCOUNTER — Telehealth: Payer: Self-pay | Admitting: *Deleted

## 2023-02-06 DIAGNOSIS — F902 Attention-deficit hyperactivity disorder, combined type: Secondary | ICD-10-CM

## 2023-02-06 NOTE — Telephone Encounter (Signed)
Dr. Maple Hudson read her sleep study and these were the results:    Her HST showed moderate obstructive sleep apnea, averaging 21 apneas/ hour with low oxygen.  2 mins CY  I recommend we order new DDME, new CPAP auto 5-15, mask of choice, humidifier, supplies, AirView/ card. She will need ov in 31-90 days. Thanks

## 2023-02-06 NOTE — Telephone Encounter (Signed)
Patient is returning missed call in reference to sleep study.

## 2023-02-06 NOTE — Telephone Encounter (Signed)
Patient has an appt with Dr. Maple Hudson tomorrow. Will close encounter.

## 2023-02-07 ENCOUNTER — Ambulatory Visit (INDEPENDENT_AMBULATORY_CARE_PROVIDER_SITE_OTHER): Payer: Medicare Other | Admitting: Internal Medicine

## 2023-02-07 ENCOUNTER — Encounter: Payer: Self-pay | Admitting: Internal Medicine

## 2023-02-07 VITALS — BP 178/94 | HR 96 | Temp 98.2°F | Ht 62.0 in | Wt 155.2 lb

## 2023-02-07 DIAGNOSIS — G4733 Obstructive sleep apnea (adult) (pediatric): Secondary | ICD-10-CM

## 2023-02-07 DIAGNOSIS — I10 Essential (primary) hypertension: Secondary | ICD-10-CM

## 2023-02-07 MED ORDER — AMPHETAMINE-DEXTROAMPHETAMINE 20 MG PO TABS
ORAL_TABLET | ORAL | 0 refills | Status: DC
Start: 2023-02-07 — End: 2023-03-08

## 2023-02-07 NOTE — Progress Notes (Unsigned)
12/29/22- 72 yoF never smoker for sleep evaluation courtesy of Dr Betty [place] with concern of OSA. Lives alone.  Remote sleep study (2009/ AHI 5/hr). Using oral appliance from [employer]. Her phone app indicates she is not snoring with OAP, but it is giving her a sore R jaw/ear.  Had remote UPPP/ septoplasty. Would like to consider CPAP. Less tired if she wears OAP. Occ tea. Uses Adderall to help with driving. She works as a Designer, multimedia and drives a lot. Medical problem list includes Insomnia, HTN, Allergic Rhinitis, GERD, Diverticulitis, Hypothyroid, Urticaria, hx Pulmonary Embolism, ADD, Depression,  -Adderall 20,  Epworth score-6 Body weight today-156 lbs  02/07/23- 72 yoF never smoker followed for OSA, complicated by Insomnia, HTN, Allergic Rhinitis, GERD, Diverticulitis, Hypothyroid, Urticaria, hx Pulmonary Embolism, ADD, Depression,  Works as Designer, multimedia -Adderall 20,  HST 01/08/23- AHI 21.7/hr, desat to 68%, body weight 156 lbs Arrival BP today 178/94 Body weight today 155 lbs   ROS-see HPI   + = positive Constitutional:    weight loss, night sweats, fevers, chills, fatigue, lassitude. HEENT:    headaches, + difficulty swallowing, tooth/dental problems, sore throat,       sneezing, itching, +ear ache, nasal congestion, post nasal drip, snoring CV:    chest pain, orthopnea, PND, swelling in lower extremities, anasarca, dizziness, palpitations Resp:   +shortness of breath with exertion or at rest.                productive cough,   non-productive cough, coughing up of blood.              change in color of mucus.  wheezing.   Skin:    rash or lesions. GI:   heartburn,+ indigestion, abdominal pain, nausea, vomiting, diarrhea,                 change in bowel habits, loss of appetite GU: dysuria, change in color of urine, no urgency or frequency.   flank pain. MS:  + joint pain, stiffness, decreased range of motion, back pain. Neuro-     nothing unusual Psych:  change in  mood or affect.  depression or +anxiety.   memory loss.  OBJ- Physical Exam General- Alert, Oriented, Affect-appropriate, Distress- none acute Skin- rash-none, lesions- none, excoriation- none Lymphadenopathy- none Head- atraumatic            Eyes- Gross vision intact, PERRLA, conjunctivae and secretions clear            Ears- Hearing, canals-normal            Nose- Clear, no-Septal dev, mucus, polyps, erosion, perforation             Throat- Mallampati II/ +UPPP , mucosa clear , drainage- none, tonsils- atrophic Neck- flexible , trachea midline, no stridor , thyroid nl, carotid no bruit Chest - symmetrical excursion , unlabored           Heart/CV- RRR , no murmur , no gallop  , no rub, nl s1 s2                           - JVD- none , edema- none, stasis changes- none, varices- none           Lung- clear to P&A, wheeze- none, cough- none , dullness-none, rub- none           Chest wall-  Abd-  Br/ Gen/ Rectal- Not done, not indicated  Extrem- cyanosis- none, clubbing, none, atrophy- none, strength- nl Neuro- grossly intact to observation

## 2023-02-07 NOTE — Patient Instructions (Signed)
Order- new DME, new CPAP auto 5-15, mask of choice, humidifier, supplies, AirView/ card

## 2023-02-08 ENCOUNTER — Encounter: Payer: Self-pay | Admitting: Internal Medicine

## 2023-02-08 NOTE — Assessment & Plan Note (Addendum)
Options reviewed. Discussed travel machines. Plan- CPAP auto 5-15

## 2023-02-08 NOTE — Assessment & Plan Note (Signed)
Significantly elevated today. She indicates a stress issue. Plan- She is to contact her PCP for guidance

## 2023-03-06 ENCOUNTER — Encounter (HOSPITAL_BASED_OUTPATIENT_CLINIC_OR_DEPARTMENT_OTHER): Payer: Self-pay

## 2023-03-06 ENCOUNTER — Emergency Department (HOSPITAL_BASED_OUTPATIENT_CLINIC_OR_DEPARTMENT_OTHER): Payer: Medicare Other | Admitting: Radiology

## 2023-03-06 DIAGNOSIS — E039 Hypothyroidism, unspecified: Secondary | ICD-10-CM | POA: Insufficient documentation

## 2023-03-06 DIAGNOSIS — M546 Pain in thoracic spine: Secondary | ICD-10-CM | POA: Insufficient documentation

## 2023-03-06 DIAGNOSIS — I10 Essential (primary) hypertension: Secondary | ICD-10-CM | POA: Insufficient documentation

## 2023-03-06 DIAGNOSIS — S5001XA Contusion of right elbow, initial encounter: Secondary | ICD-10-CM | POA: Insufficient documentation

## 2023-03-06 DIAGNOSIS — Z7982 Long term (current) use of aspirin: Secondary | ICD-10-CM | POA: Insufficient documentation

## 2023-03-06 DIAGNOSIS — M79641 Pain in right hand: Secondary | ICD-10-CM | POA: Diagnosis not present

## 2023-03-06 DIAGNOSIS — M25531 Pain in right wrist: Secondary | ICD-10-CM | POA: Insufficient documentation

## 2023-03-06 DIAGNOSIS — M542 Cervicalgia: Secondary | ICD-10-CM | POA: Insufficient documentation

## 2023-03-06 DIAGNOSIS — M19021 Primary osteoarthritis, right elbow: Secondary | ICD-10-CM | POA: Diagnosis not present

## 2023-03-06 DIAGNOSIS — W01198A Fall on same level from slipping, tripping and stumbling with subsequent striking against other object, initial encounter: Secondary | ICD-10-CM | POA: Insufficient documentation

## 2023-03-06 DIAGNOSIS — R519 Headache, unspecified: Secondary | ICD-10-CM | POA: Diagnosis not present

## 2023-03-06 DIAGNOSIS — S59901A Unspecified injury of right elbow, initial encounter: Secondary | ICD-10-CM | POA: Diagnosis not present

## 2023-03-06 NOTE — ED Triage Notes (Signed)
 States fell this am.  Slipped on ice and fell backwards.  States went to orthopedic for right wrist injury.  Was told to come to ER for CT as hit head and is on ASA  Denies LOC or head injury  Denies head pain.  States also injured right elbow.  Bruising noted.  States elbow was not xray.

## 2023-03-07 ENCOUNTER — Emergency Department (HOSPITAL_BASED_OUTPATIENT_CLINIC_OR_DEPARTMENT_OTHER)
Admission: EM | Admit: 2023-03-07 | Discharge: 2023-03-07 | Disposition: A | Discharge status: Home / Self Care | Payer: Medicare Other | Attending: Emergency Medicine | Admitting: Emergency Medicine

## 2023-03-07 ENCOUNTER — Emergency Department (HOSPITAL_BASED_OUTPATIENT_CLINIC_OR_DEPARTMENT_OTHER): Payer: Medicare Other

## 2023-03-07 DIAGNOSIS — S5001XA Contusion of right elbow, initial encounter: Secondary | ICD-10-CM | POA: Diagnosis not present

## 2023-03-07 DIAGNOSIS — W009XXA Unspecified fall due to ice and snow, initial encounter: Secondary | ICD-10-CM

## 2023-03-07 DIAGNOSIS — I6523 Occlusion and stenosis of bilateral carotid arteries: Secondary | ICD-10-CM | POA: Diagnosis not present

## 2023-03-07 DIAGNOSIS — S0990XA Unspecified injury of head, initial encounter: Secondary | ICD-10-CM | POA: Diagnosis not present

## 2023-03-07 MED ORDER — ACETAMINOPHEN 500 MG PO TABS
1000.0000 mg | ORAL_TABLET | Freq: Once | ORAL | Status: AC
  Administered 2023-03-07: 1000 mg via ORAL
  Filled 2023-03-07: qty 2

## 2023-03-07 NOTE — ED Provider Notes (Signed)
 [place] EMERGENCY DEPARTMENT AT [facility] Provider Note  CSN: [id number] Arrival date & time: 03/06/23 1920  Chief Complaint(s) Fall  HPI Diana Wolfe is a 73 y.o. female patient presents after mechanical fall after slipping on the ice at 8 AM this morning.  Patient reports falling backward, landing on her right elbow, back.  She did report hitting her head.  Denies any loss of consciousness.  States that she takes a baby aspirin every few days.  No other anticoagulation.  Patient reported right wrist pain immediately.  No other pain related to the fall other than some generalized soreness.  She denied any headache or neck pain at the time.  Patient went to orthopedic urgent care later in the afternoon who obtained an x-ray of the right wrist and diagnosed her with a possible hairline fracture.  She was provided with a wrist brace.  Instructed to present to the emergency department for further evaluation of head trauma.  Patient reports that she called her PCP and her cardiologist who both agreed that she should come.  Patient arrived approximately 12 hours after incident and remained headache free.  She did report developing a mild headache around midnight stating that likely related to her not eating throughout the day.  He does report more increased soreness in her back, neck muscles and hip muscles.  No other physical complaints or focal deficits.  The history is provided by the patient.    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   Dysphagia 04/21/2020   Generalized abdominal pain 04/21/2020   Hematochezia 04/21/2020   Left lower quadrant pain 04/21/2020   History of colonic polyps 04/21/2020   Urticaria 04/21/2020   B12 deficiency 01/31/2019   Medication management 06/23/2014   Vaginal atrophy 03/27/2014   Hyperlipidemia    Hypertension    Anxiety    GERD (gastroesophageal reflux disease)    ADD (attention deficit disorder)    Depression    Vitamin D  deficiency, unspecified    Hypothyroidism 04/25/2008   Iron deficiency anemia 04/25/2008   Obstructive sleep apnea 11/16/2007   Allergic rhinitis 11/16/2007   Headache 11/16/2007   PULMONARY EMBOLISM, HX OF 11/16/2007   Home Medication(s) Prior to Admission medications   Medication Sig Start Date End Date Taking? Authorizing Provider  amphetamine -dextroamphetamine  (ADDERALL) 20 MG tablet Take 1/2 - 1 tab daily. Or 1/2 tab BID with driving long distances 87/82/75   Cranford, [name], NP  aspirin 81 MG chewable tablet 3-4 days a week    [provider]  cetirizine (ZYRTEC) 10 MG tablet PRN    [provider]  Cyanocobalamin  (B-12) 1000 MCG TABS Take by mouth.    [provider]  diclofenac (VOLTAREN) 75 MG EC tablet Take 75 mg by mouth 2 (two) times daily.    [provider]  hydrALAZINE  (APRESOLINE ) 10 MG tablet Take half tablet as needed for systolic blood pressure (top number) more than 190. If blood pressure remains more  than 190 one hour after taking half tablet, may take additional half tablet. 07/07/22   Vannie Reche RAMAN, NP  levothyroxine  (SYNTHROID ) 75 MCG tablet TAKE 1 AND 1/2 TABLETS BY MOUTH ON MONDAYS, WEDNESDAYS AND FRIDAYS THEN TAKE 1 TABLET ON OTHER DAYS OF THE WEEK. TAKE ON EMPTY STOMACH WITH ONLY WATER FOR 30 MINUTES AND NO ANTACIDS, CALCIUM  OR MAGNESIUM FOR 4 HOURS AND AVOID BIOTIN 09/22/22   Wilkinson, Dana E, NP  meclizine  (ANTIVERT ) 25 MG tablet 1/2-1 pill up to 3 times daily for motion  sickness/dizziness 09/30/22   Cranford, [name], NP  Multiple Vitamin (MULTIVITAMIN) capsule Take 1 capsule by mouth daily.    [provider]  omeprazole  (PRILOSEC) 40 MG capsule Take 1 capsule (40 mg total) by mouth daily. TAKE ONE CAPSULE BY MOUTH DAILY TO PREVENT HEARTBURN AND INDIGESTION Patient taking differently: Take 40 mg by mouth as needed. TAKE ONE CAPSULE BY MOUTH DAILY TO PREVENT HEARTBURN AND INDIGESTION 06/05/22   Burnette Fallow, MD  vitamin C (ASCORBIC ACID) 500 MG tablet Take 1,200 mg by mouth daily.    [provider]  Vitamin D , Ergocalciferol , (DRISDOL ) 1.25 MG (50000 UNIT) CAPS capsule TAKE 1 CAPSULE BY MOUTH 3 TIMES A WEEK ON MONDAY- WEDNESDAY- FRIDAY FOR SEVERE VITAMIN D . DEFICIENCY 02/07/22   Wilkinson, Dana E, NP                                                                                                                                    Allergies Levofloxacin, Buspirone , Cefprozil, Codeine, Hydrocodone-acetaminophen , Morphine, Nitrofuran derivatives, Pravastatin, and Lexapro  [escitalopram  oxalate]  Review of Systems Review of Systems As noted in HPI  Physical Exam Vital Signs  I have reviewed the triage vital signs BP (!) 175/70   Pulse 62   Temp 97.7 F (36.5 C) (Oral)   Resp 16   Ht 5' 2 (1.575 m)   Wt 69.9 kg   SpO2 98%   BMI 28.17 kg/m   Physical Exam Constitutional:      General: She is not in acute distress.    Appearance: She is well-developed. She is not diaphoretic.  HENT:     Head: Normocephalic and atraumatic.     Right Ear: External ear normal.     Left Ear: External ear normal.     Nose: Nose normal.  Eyes:     General: No scleral icterus.       Right eye: No discharge.        Left eye: No discharge.     Conjunctiva/sclera: Conjunctivae normal.     Pupils: Pupils are equal, round, and reactive to light.  Cardiovascular:     Rate and Rhythm: Normal rate and regular rhythm.     Pulses:          Radial pulses are 2+  on the right side and 2+ on the left side.       Dorsalis pedis  pulses are 2+ on the right side and 2+ on the left side.     Heart sounds: Normal heart sounds. No murmur heard.    No friction rub. No gallop.  Pulmonary:     Effort: Pulmonary effort is normal. No respiratory distress.     Breath sounds: Normal breath sounds. No stridor. No wheezing.  Abdominal:     General: There is no distension.     Palpations: Abdomen is soft.     Tenderness: There is no abdominal tenderness.  Musculoskeletal:     Right elbow: Normal range of motion. Tenderness present.       Arms:     Cervical back: Normal range of motion and neck supple. No bony tenderness. Muscular tenderness present. No spinous process tenderness.     Thoracic back: Tenderness present. No bony tenderness.     Lumbar back: No bony tenderness.       Back:     Comments: Clavicles stable. Chest stable to AP/Lat compression. Pelvis stable to Lat compression. No obvious extremity deformity. No chest or abdominal wall contusion.  Skin:    General: Skin is warm and dry.     Findings: No erythema or rash.  Neurological:     Mental Status: She is alert and oriented to person, place, and time.     Comments: Moving all extremities     ED Results and Treatments Labs (all labs ordered are listed, but only abnormal results are displayed) Labs Reviewed - No data to display                                                                                                                       EKG  EKG Interpretation Date/Time:    Ventricular Rate:    PR Interval:    QRS Duration:    QT Interval:    QTC Calculation:   R Axis:      Text Interpretation:         Radiology CT Head Wo Contrast Result Date: 03/07/2023 CLINICAL DATA:  Head trauma.  Fall EXAM: CT HEAD WITHOUT CONTRAST TECHNIQUE: Contiguous axial images were obtained from the base of the skull through the vertex without intravenous contrast. RADIATION DOSE REDUCTION:  This exam was performed according to the departmental dose-optimization program which includes automated exposure control, adjustment of the mA and/or kV according to patient size and/or use of iterative reconstruction technique. COMPARISON:  None Available. FINDINGS: Brain: No mass, hemorrhage or extra-axial collection. Old left cerebellar infarct. Vascular: Atherosclerotic calcification of the internal carotid arteries at the skull base. No abnormal hyperdensity of the major intracranial arteries or dural venous sinuses. Skull: The visualized skull base, calvarium and extracranial soft tissues are normal. Sinuses/Orbits: No fluid levels or advanced mucosal thickening of the visualized paranasal sinuses. No mastoid or middle ear effusion. Normal orbits. Other: None. IMPRESSION: 1. No acute intracranial abnormality. 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 Electronically Signed   By: Franky Stanford M.D.   On: 03/07/2023 03:15  DG Elbow Complete Right Result Date: 03/06/2023 CLINICAL DATA:  Fall.  Injury.  Right elbow injury. EXAM: RIGHT ELBOW - COMPLETE 3+ VIEW COMPARISON:  None Available. FINDINGS: Normal position of the distal anterior humeral fat pad without evidence of a joint effusion. Mild radiocapitellar joint space narrowing. Moderate capitellar subchondral sclerosis and cystic change. Mild-to-moderate radial head-neck junction circumferential degenerative osteophytes and osteophytosis within the adjacent ulna at the radial notch. Mild-to-moderate peripheral medial elbow osteophytosis. Mild-to-moderate chronic enthesopathic change at the common extensor tendon origin at the lateral epicondyle. No acute fracture or dislocation. IMPRESSION: 1. No acute fracture. 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 Electronically Signed   By: Tanda Lyons M.D.   On: 03/06/2023 20:23    Medications Ordered in ED Medications  acetaminophen  (TYLENOL ) tablet 1,000 mg (1,000 mg Oral Given 03/07/23 0300)    Procedures Procedures  (including critical care time) Medical Decision Making / ED Course   Medical Decision Making Amount and/or Complexity of Data Reviewed Radiology: ordered.  Risk OTC drugs.    Mechanical fall. Resulting in minor head trauma.  Mostly right upper extremity injury. X-ray of the right elbow negative. CT head negative for ICH Cervical spine cleared by Nexus criteria.    Final Clinical Impression(s) / ED Diagnoses Final diagnoses:  Fall due to slipping on ice or snow, initial encounter  Contusion of right elbow, initial encounter   The patient appears reasonably screened and/or stabilized for discharge and I doubt any other medical condition or other [facility] requiring further screening, evaluation, or treatment in the ED at this time. I have discussed the findings, Dx and Tx plan with the patient/family who expressed understanding and agree(s) with the plan. Discharge instructions discussed at length. The patient/family was given strict return precautions who verbalized understanding of the instructions. No further questions at time of discharge.  Disposition: Discharge  Condition: Good  ED Discharge Orders     None       Follow Up: Tonita Fallow, MD [address] [place] [address] [phone]  Call  to schedule an appointment for close follow up    This chart was dictated using voice recognition software.  Despite best efforts to proofread,  errors can occur which can change the documentation meaning.    Trine Raynell Moder, MD 03/07/23 [phone]

## 2023-03-07 NOTE — ED Notes (Signed)
 Initial contact made. Pt is resting in bed. Pt endorses pain and tenderness down the spine and right wrist pain 7/10. Denies any other symptoms at this time

## 2023-03-08 ENCOUNTER — Other Ambulatory Visit: Payer: Self-pay | Admitting: Nurse Practitioner

## 2023-03-08 ENCOUNTER — Encounter: Payer: Self-pay | Admitting: Nurse Practitioner

## 2023-03-08 DIAGNOSIS — F902 Attention-deficit hyperactivity disorder, combined type: Secondary | ICD-10-CM

## 2023-03-08 MED ORDER — AMPHETAMINE-DEXTROAMPHETAMINE 20 MG PO TABS
ORAL_TABLET | ORAL | 0 refills | Status: DC
Start: 2023-03-08 — End: 2023-04-04

## 2023-03-14 DIAGNOSIS — H9201 Otalgia, right ear: Secondary | ICD-10-CM | POA: Diagnosis not present

## 2023-03-15 DIAGNOSIS — M79641 Pain in right hand: Secondary | ICD-10-CM | POA: Diagnosis not present

## 2023-03-31 ENCOUNTER — Ambulatory Visit: Payer: Medicare Other | Admitting: Internal Medicine

## 2023-04-04 ENCOUNTER — Other Ambulatory Visit: Payer: Self-pay | Admitting: Nurse Practitioner

## 2023-04-04 DIAGNOSIS — F902 Attention-deficit hyperactivity disorder, combined type: Secondary | ICD-10-CM

## 2023-04-04 MED ORDER — AMPHETAMINE-DEXTROAMPHETAMINE 20 MG PO TABS
ORAL_TABLET | ORAL | 0 refills | Status: DC
Start: 2023-04-04 — End: 2023-05-04

## 2023-04-07 ENCOUNTER — Encounter: Payer: Self-pay | Admitting: Internal Medicine

## 2023-04-25 ENCOUNTER — Ambulatory Visit: Payer: Medicare Other | Admitting: Nurse Practitioner

## 2023-05-04 ENCOUNTER — Other Ambulatory Visit: Payer: Self-pay

## 2023-05-04 DIAGNOSIS — F902 Attention-deficit hyperactivity disorder, combined type: Secondary | ICD-10-CM

## 2023-05-04 MED ORDER — AMPHETAMINE-DEXTROAMPHETAMINE 20 MG PO TABS
ORAL_TABLET | ORAL | 0 refills | Status: DC
Start: 2023-05-04 — End: 2023-05-24

## 2023-05-06 NOTE — Progress Notes (Unsigned)
 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/08/23- AHI 21.7/hr, desat to 68%, body weight 156 lbs ===================================================================================================================   02/07/23- 72 yoF never smoker followed for OSA, complicated by Insomnia, HTN, Allergic Rhinitis, GERD, Diverticulitis, Hypothyroid, Urticaria, hx Pulmonary Embolism, ADD, Depression,  Works as Designer, multimedia -Adderall 20,  HST 01/08/23- AHI 21.7/hr, desat to 68%, body weight 156 lbs Arrival BP today 178/94  Body weight today 155 lbs Says she got bad new, raising BP. To contact her PCP. Reviewed recent HST and discussed treatment options, including her past unsuccessful trial of CPAP. Might be future candidate for OAP or Inspire. She will start over with CPAP 5-15.  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 History of Present Illness   The patient, with a history of sleep apnea, presents with discomfort from her CPAP mask. She reports that the mask 'grabs my hair and pulls it,' leading to frequent awakenings to adjust the mask. She also describes the mask as 'hot.' Despite these issues, the patient acknowledges the health benefits of the CPAP machine, which is set to a pressure range of 5-15 cm H2O and spends most of its time between 9-11 cm H2O. The patient's apnea control is good,  with less than two apneas per hour.  The patient has tried different mask styles, including a 'mustache mask' and a mask that covers the nose. The 'mustache mask' was not secure and frequently came off, while the nose-covering mask caused discomfort by blowing air into the patient's eyes.     ROS-see HPI   + = positive Constitutional:    weight loss, night sweats, fevers, chills, fatigue, lassitude. HEENT:    headaches, + difficulty swallowing, tooth/dental problems, sore throat,       sneezing, itching, +ear ache, nasal congestion, post nasal drip, snoring CV:    chest pain, orthopnea, PND, swelling in lower extremities, anasarca, dizziness, palpitations Resp:   +shortness of breath with exertion or at rest.                productive cough,   non-productive cough, coughing up of blood.              change in color of mucus.  wheezing.   Skin:    rash or lesions. GI:   heartburn,+ indigestion, abdominal pain, nausea, vomiting, diarrhea,                 change in bowel habits, loss of appetite GU: dysuria, change in color of urine, no urgency or frequency.   flank pain. MS:  + joint pain, stiffness, decreased range of motion, back pain. Neuro-     nothing unusual Psych:  change in mood or affect.  depression or +anxiety.   memory loss.  OBJ- Physical Exam General- Alert, Oriented, Affect-appropriate, Distress- none acute Skin- rash-none, lesions- none, excoriation- none Lymphadenopathy- none Head- atraumatic            Eyes- Gross vision intact, PERRLA, conjunctivae  and secretions clear            Ears- Hearing, canals-normal            Nose- Clear, no-Septal dev, mucus, polyps, erosion, perforation             Throat- Mallampati II/ +UPPP , mucosa clear , drainage- none, tonsils- atrophic Neck- flexible , trachea midline, no stridor , thyroid nl, carotid no bruit Chest - symmetrical excursion , unlabored           Heart/CV- RRR , no murmur , no gallop  , no rub, nl s1 s2                            - JVD- none , edema- none, stasis changes- none, varices- none           Lung- clear to P&A, wheeze- none, cough- none , dullness-none, rub- none           Chest wall-  Abd-  Br/ Gen/ Rectal- Not done, not indicated Extrem- cyanosis- none, clubbing, none, atrophy- none, strength- nl Neuro- grossly intact to observation Assessment and Plan:    Obstructive Sleep Apnea (OSA) CPAP effective at 9-11 cm H2O with <2 apneas/hour. Discomfort with mask affects sleep quality. - Order home care company to refit CPAP mask for comfort and seal. - Adjust CPAP pressure settings to reduce leaks and improve comfort. - Instruct home care company to review options for adjusting humidifier or perceived air temperature.

## 2023-05-08 ENCOUNTER — Ambulatory Visit: Payer: Medicare Other | Admitting: Internal Medicine

## 2023-05-08 ENCOUNTER — Encounter: Payer: Self-pay | Admitting: Internal Medicine

## 2023-05-08 VITALS — BP 160/76 | HR 84 | Temp 98.2°F | Ht 61.0 in | Wt 155.2 lb

## 2023-05-08 DIAGNOSIS — G4733 Obstructive sleep apnea (adult) (pediatric): Secondary | ICD-10-CM

## 2023-05-08 NOTE — Patient Instructions (Signed)
 Order- DME [facility]- please change auto range to 4-12.                                 Please refit mask of choice for comfort and better seal.                                 Please help her adjust humidifier temperature for comfort

## 2023-05-09 ENCOUNTER — Encounter: Payer: Self-pay | Admitting: Internal Medicine

## 2023-05-16 ENCOUNTER — Encounter: Payer: Self-pay | Admitting: Internal Medicine

## 2023-05-22 ENCOUNTER — Encounter (HOSPITAL_BASED_OUTPATIENT_CLINIC_OR_DEPARTMENT_OTHER): Payer: Self-pay | Admitting: Student

## 2023-05-22 ENCOUNTER — Ambulatory Visit (HOSPITAL_BASED_OUTPATIENT_CLINIC_OR_DEPARTMENT_OTHER)

## 2023-05-22 ENCOUNTER — Ambulatory Visit (HOSPITAL_BASED_OUTPATIENT_CLINIC_OR_DEPARTMENT_OTHER): Admitting: Student

## 2023-05-22 DIAGNOSIS — M25562 Pain in left knee: Secondary | ICD-10-CM | POA: Diagnosis not present

## 2023-05-22 DIAGNOSIS — M85862 Other specified disorders of bone density and structure, left lower leg: Secondary | ICD-10-CM | POA: Diagnosis not present

## 2023-05-22 DIAGNOSIS — M25462 Effusion, left knee: Secondary | ICD-10-CM | POA: Diagnosis not present

## 2023-05-22 MED ORDER — LIDOCAINE HCL 1 % IJ SOLN
4.0000 mL | INTRAMUSCULAR | Status: AC | PRN
  Administered 2023-05-22: 4 mL

## 2023-05-22 MED ORDER — TRIAMCINOLONE ACETONIDE 40 MG/ML IJ SUSP
2.0000 mL | INTRAMUSCULAR | Status: AC | PRN
  Administered 2023-05-22: 2 mL via INTRA_ARTICULAR

## 2023-05-22 NOTE — Progress Notes (Signed)
 Chief Complaint: Left knee pain     History of Present Illness:    Diana Wolfe is a 73 y.o. female presenting today for evaluation of left knee pain.  This began a few days ago without any known injury.  Pain is mainly in the medial and posterior knee and is moderate in severity.  She feels a sensation like there is a knot in the back of her knee.  She does have some feeling of instability although her knee has not buckled.  She has tried bracing, ice, and meloxicam.  No other popping, clicking, or catching.   Surgical History:   Left tibia IM nail 2002  PMH/PSH/Family History/Social History/Meds/Allergies:    Past Medical History:  Diagnosis Date   ADD (attention deficit disorder)    Anxiety    Depression    GERD (gastroesophageal reflux disease)    Hyperlipidemia    Hypertension    Hypothyroid    PONV (postoperative nausea and vomiting)    Vitamin D deficiency    Past Surgical History:  Procedure Laterality Date   BREAST BIOPSY Left 07/09/2019   CESAREAN SECTION     ESOPHAGOGASTRODUODENOSCOPY N/A 09/02/2015   Procedure: ESOPHAGOGASTRODUODENOSCOPY (EGD);  Surgeon: Carman Ching, MD;  Location: [facility] ENDOSCOPY;  Service: Endoscopy;  Laterality: N/A;   ESOPHAGOGASTRODUODENOSCOPY N/A 06/05/2022   Procedure: ESOPHAGOGASTRODUODENOSCOPY (EGD);  Surgeon: Willis Modena, MD;  Location: [facility];  Service: Gastroenterology;  Laterality: N/A;   FOOT SURGERY Right    FOREIGN BODY REMOVAL N/A 06/05/2022   Procedure: FOREIGN BODY REMOVAL;  Surgeon: Willis Modena, MD;  Location: [facility] ENDOSCOPY;  Service: Gastroenterology;  Laterality: N/A;   IM NAILING TIBIA Left 01/09/2001   Dr. Doristine Section, [facility]   TONSILLECTOMY  2002   Social History   Socioeconomic History   Marital status: Divorced    Spouse name: Not on file   Number of children: Not on file   Years of education: Not on file   Highest education level: Bachelor's degree (e.g.,  BA, AB, BS)  Occupational History   Not on file  Tobacco Use   Smoking status: Never   Smokeless tobacco: Never  Substance and Sexual Activity   Alcohol use: Yes    Comment: occasional   Drug use: No   Sexual activity: Not on file  Other Topics Concern   Not on file  Social History Narrative   Not on file   Social Drivers of Health   Financial Resource Strain: Medium Risk (05/21/2023)   Overall Financial Resource Strain (CARDIA)    Difficulty of Paying Living Expenses: Somewhat hard  Food Insecurity: Patient Declined (05/21/2023)   Hunger Vital Sign    Worried About Running Out of Food in the Last Year: Patient declined    Ran Out of Food in the Last Year: Patient declined  Transportation Needs: No Transportation Needs (05/21/2023)   PRAPARE - Administrator, Civil Service (Medical): No    Lack of Transportation (Non-Medical): No  Physical Activity: Sufficiently Active (05/21/2023)   Exercise Vital Sign    Days of Exercise per Week: 4 days    Minutes of Exercise per Session: 40 min  Stress: Stress Concern Present (05/21/2023)   [employer] of Occupational Health - Occupational Stress Questionnaire    Feeling of Stress : To some  extent  Social Connections: Unknown (05/21/2023)   Social Connection and Isolation Panel [NHANES]    Frequency of Communication with Friends and Family: Never    Frequency of Social Gatherings with Friends and Family: Patient declined    Attends Religious Services: More than 4 times per year    Active Member of [employer] or Organizations: Yes    Attends Banker Meetings: Patient declined    Marital Status: Divorced   Family History  Problem Relation Age of Onset   Breast cancer Mother        26s   Cancer Mother 38       Breast   Heart disease Father    Hypertension Father    Breast cancer Cousin        twice 40, 52   Allergies  Allergen Reactions   Levofloxacin     Neck sweling/difficult breathing   Buspirone     Cefprozil    Codeine     REACTION: vomiting   Hydrocodone-Acetaminophen    Hydrocodone-Acetaminophen Nausea Only   Morphine    Nitrofuran Derivatives Itching   Pravastatin Swelling   Lexapro [Escitalopram Oxalate] Other (See Comments)    Tapping/abnormal movements   Current Outpatient Medications  Medication Sig Dispense Refill   amphetamine-dextroamphetamine (ADDERALL) 20 MG tablet Take 1/2 - 1 tab daily. Or 1/2 tab BID with driving long distances 30 tablet 0   aspirin 81 MG chewable tablet 3-4 days a week     cetirizine (ZYRTEC) 10 MG tablet PRN     Cyanocobalamin (B-12) 1000 MCG TABS Take by mouth.     diclofenac (VOLTAREN) 75 MG EC tablet Take 75 mg by mouth 2 (two) times daily.     hydrALAZINE (APRESOLINE) 10 MG tablet Take half tablet as needed for systolic blood pressure (top number) more than 190. If blood pressure remains more than 190 one hour after taking half tablet, may take additional half tablet. 30 tablet 1   levothyroxine (SYNTHROID) 75 MCG tablet TAKE 1 AND 1/2 TABLETS BY MOUTH ON MONDAYS, WEDNESDAYS AND FRIDAYS THEN TAKE 1 TABLET ON OTHER DAYS OF THE WEEK. TAKE ON EMPTY STOMACH WITH ONLY WATER FOR 30 MINUTES AND NO ANTACIDS, CALCIUM OR MAGNESIUM FOR 4 HOURS AND AVOID BIOTIN 135 tablet 1   meclizine (ANTIVERT) 25 MG tablet 1/2-1 pill up to 3 times daily for motion sickness/dizziness 30 tablet 0   Multiple Vitamin (MULTIVITAMIN) capsule Take 1 capsule by mouth daily.     omeprazole (PRILOSEC) 40 MG capsule Take 1 capsule (40 mg total) by mouth daily. TAKE ONE CAPSULE BY MOUTH DAILY TO PREVENT HEARTBURN AND INDIGESTION (Patient taking differently: Take 40 mg by mouth as needed. TAKE ONE CAPSULE BY MOUTH DAILY TO PREVENT HEARTBURN AND INDIGESTION) 90 capsule 1   vitamin C (ASCORBIC ACID) 500 MG tablet Take 1,200 mg by mouth daily.     Vitamin D, Ergocalciferol, (DRISDOL) 1.25 MG (50000 UNIT) CAPS capsule TAKE 1 CAPSULE BY MOUTH 3 TIMES A WEEK ON MONDAY- WEDNESDAY- FRIDAY FOR  SEVERE VITAMIN D. DEFICIENCY 36 capsule 3   No current facility-administered medications for this visit.   No results found.  Review of Systems:   A ROS was performed including pertinent positives and negatives as documented in the HPI.  Physical Exam :   Constitutional: NAD and appears stated age Neurological: Alert and oriented Psych: Appropriate affect and cooperative There were no vitals taken for this visit.   Comprehensive Musculoskeletal Exam:    Left knee exam  demonstrates tenderness to palpation along the medial joint line without significant effusion.  Small Baker's cyst palpable in the posterior knee.  Active range of motion from 0 to 120 degrees.  No instability with varus or valgus stress.  Knee flexion and extension strength is 5/5.  Imaging:   Xray (left knee 4 views): Well-maintained joint spacing with mild sclerosis in the medial compartment.  Tibial IM nail in good positioning without evidence of complication.   I personally reviewed and interpreted the radiographs.   Assessment:   73 y.o. female with acute atraumatic pain of the left knee.  X-rays show some mild joint space narrowing and sclerosis within the medial compartment but otherwise negative.  Given pain radiating posteriorly I do have some concern for possible meniscal root tear.  In discussion of treatment options I have offered a diagnostic cortisone injection as well as referral to physical therapy to trial before determining if MRI will be necessary.  After consideration, patient would like to opt for injection which was performed today in clinic without any complication.  Will plan to assess relief with this and should this not bring her relief or if symptoms recur down the road, I would highly recommend an MRI for further evaluation.  Patient is agreeable to plan.  Plan :    -Diagnostic cortisone injection performed of the left knee today -Return to clinic as needed -Consider MRI should symptoms  persist or recur     Procedure Note  Patient: Diana Wolfe             Date of Birth: 10/09/1950           MRN: [id number]             Visit Date: 05/22/2023  Procedures: Visit Diagnoses:  1. Acute pain of left knee     Large Joint Inj: L knee on 05/22/2023 6:05 PM Indications: pain Details: 22 G 1.5 in needle, anterolateral approach Medications: 4 mL lidocaine 1 %; 2 mL triamcinolone acetonide 40 MG/ML Outcome: tolerated well, no immediate complications Procedure, treatment alternatives, risks and benefits explained, specific risks discussed. Consent was given by the patient. Immediately prior to procedure a time out was called to verify the correct patient, procedure, equipment, support staff and site/side marked as required. Patient was prepped and draped in the usual sterile fashion.      I personally saw and evaluated the patient, and participated in the management and treatment plan.  Hazle Nordmann, PA-C Orthopedics

## 2023-05-23 ENCOUNTER — Encounter: Payer: Self-pay | Admitting: Internal Medicine

## 2023-05-24 ENCOUNTER — Ambulatory Visit (INDEPENDENT_AMBULATORY_CARE_PROVIDER_SITE_OTHER): Payer: Medicare Other | Admitting: Family Medicine

## 2023-05-24 ENCOUNTER — Encounter: Payer: Self-pay | Admitting: Family Medicine

## 2023-05-24 VITALS — BP 113/76 | HR 82 | Temp 98.0°F | Ht 61.0 in | Wt 152.2 lb

## 2023-05-24 DIAGNOSIS — F419 Anxiety disorder, unspecified: Secondary | ICD-10-CM

## 2023-05-24 DIAGNOSIS — M6289 Other specified disorders of muscle: Secondary | ICD-10-CM

## 2023-05-24 DIAGNOSIS — I10 Essential (primary) hypertension: Secondary | ICD-10-CM | POA: Diagnosis not present

## 2023-05-24 DIAGNOSIS — E039 Hypothyroidism, unspecified: Secondary | ICD-10-CM | POA: Diagnosis not present

## 2023-05-24 DIAGNOSIS — K219 Gastro-esophageal reflux disease without esophagitis: Secondary | ICD-10-CM

## 2023-05-24 DIAGNOSIS — E559 Vitamin D deficiency, unspecified: Secondary | ICD-10-CM

## 2023-05-24 DIAGNOSIS — Z79899 Other long term (current) drug therapy: Secondary | ICD-10-CM

## 2023-05-24 DIAGNOSIS — G4733 Obstructive sleep apnea (adult) (pediatric): Secondary | ICD-10-CM | POA: Diagnosis not present

## 2023-05-24 DIAGNOSIS — F902 Attention-deficit hyperactivity disorder, combined type: Secondary | ICD-10-CM | POA: Diagnosis not present

## 2023-05-24 DIAGNOSIS — R7303 Prediabetes: Secondary | ICD-10-CM

## 2023-05-24 DIAGNOSIS — R41840 Attention and concentration deficit: Secondary | ICD-10-CM

## 2023-05-24 DIAGNOSIS — E782 Mixed hyperlipidemia: Secondary | ICD-10-CM

## 2023-05-24 MED ORDER — AMPHETAMINE-DEXTROAMPHETAMINE 20 MG PO TABS: ORAL_TABLET | ORAL | 0 refills | Status: DC

## 2023-05-24 MED ORDER — VITAMIN D (ERGOCALCIFEROL) 1.25 MG (50000 UNIT) PO CAPS: ORAL_CAPSULE | ORAL | 3 refills | Status: AC

## 2023-05-24 MED ORDER — LORAZEPAM 0.5 MG PO TABS
0.5000 mg | ORAL_TABLET | Freq: Every evening | ORAL | 1 refills | Status: DC | PRN
Start: 2023-05-24 — End: 2023-11-09

## 2023-05-24 NOTE — Assessment & Plan Note (Signed)
-  Continue CPAP, follow-up with sleep medicine

## 2023-05-24 NOTE — Assessment & Plan Note (Signed)
 Continue instructions for cardiology blood pressure parameters. Take hydralazine as needed

## 2023-05-24 NOTE — Assessment & Plan Note (Signed)
 Continue efforts in low-fat diet and healthy activity level

## 2023-05-24 NOTE — Progress Notes (Signed)
 New Patient Office Visit  Subjective    Patient ID: Diana Wolfe, female    DOB: Jul 25, 1950  Age: 73 y.o. MRN: [id number]  CC:  Chief Complaint  Patient presents with   Establish Care    HPI Diana Wolfe presents to establish care today. Has hx fluctuating BPs. Per cardiology, take hydralazine 10mg  once daily for systolic BP >190. Has never had to take this. 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 Has not ever been offered genetic testing.  Declines today. Is not currently being treated for depression. Requesting refills of Adderall, vitamin D. Reports that she is not sleeping well, states she does not usually sleep well but recently has been worse due to anxiety. States she has tried so many medications for depression and anxiety, she states that she would rather deal with that and side effects from medications. She is not fasting today. Reports compliance with medication regimen.  Denies other concerns today.  Outpatient Encounter Medications as of 05/24/2023  Medication Sig   aspirin 81 MG chewable tablet 3-4 days a week   cetirizine (ZYRTEC) 10 MG tablet PRN   Cyanocobalamin (B-12) 1000 MCG TABS Take by mouth.   diclofenac (VOLTAREN) 75 MG EC tablet Take 75 mg by mouth 2 (two) times daily.   hydrALAZINE (APRESOLINE) 10 MG tablet Take half tablet as needed for systolic blood pressure (top number) more than 190. If blood pressure remains more than 190 one hour after taking half tablet, may take additional half tablet.   levothyroxine (SYNTHROID) 75 MCG tablet TAKE 1 AND 1/2 TABLETS BY MOUTH ON MONDAYS, WEDNESDAYS AND FRIDAYS THEN TAKE 1 TABLET ON OTHER DAYS OF THE WEEK. TAKE ON EMPTY STOMACH WITH ONLY WATER FOR 30 MINUTES AND NO ANTACIDS, CALCIUM OR MAGNESIUM FOR 4 HOURS AND AVOID BIOTIN   LORazepam (ATIVAN) 0.5 MG tablet Take 1 tablet (0.5 mg total) by mouth at bedtime as needed for anxiety or sleep.   meclizine (ANTIVERT) 25 MG tablet  1/2-1 pill up to 3 times daily for motion sickness/dizziness   Multiple Vitamin (MULTIVITAMIN) capsule Take 1 capsule by mouth daily.   omeprazole (PRILOSEC) 40 MG capsule Take 1 capsule (40 mg total) by mouth daily. TAKE ONE CAPSULE BY MOUTH DAILY TO PREVENT HEARTBURN AND INDIGESTION (Patient taking differently: Take 40 mg by mouth as needed. TAKE ONE CAPSULE BY MOUTH DAILY TO PREVENT HEARTBURN AND INDIGESTION)   vitamin C (ASCORBIC ACID) 500 MG tablet Take 1,200 mg by mouth daily.   [DISCONTINUED] amphetamine-dextroamphetamine (ADDERALL) 20 MG tablet Take 1/2 - 1 tab daily. Or 1/2 tab BID with driving long distances   [DISCONTINUED] Vitamin D, Ergocalciferol, (DRISDOL) 1.25 MG (50000 UNIT) CAPS capsule TAKE 1 CAPSULE BY MOUTH 3 TIMES A WEEK ON MONDAY- WEDNESDAY- FRIDAY FOR SEVERE VITAMIN D. DEFICIENCY   [START ON 06/03/2023] amphetamine-dextroamphetamine (ADDERALL) 20 MG tablet Take 1/2 - 1 tab daily. Or 1/2 tab BID with driving long distances   [START ON 07/02/2023] amphetamine-dextroamphetamine (ADDERALL) 20 MG tablet Take 1/2 - 1 tab daily. Or 1/2 tab BID with driving long distances   [START ON 08/02/2023] amphetamine-dextroamphetamine (ADDERALL) 20 MG tablet Take 1/2 - 1 tab daily. Or 1/2 tab BID with driving long distances   Vitamin D, Ergocalciferol, (DRISDOL) 1.25 MG (50000 UNIT) CAPS capsule TAKE 1 CAPSULE BY MOUTH 3 TIMES A WEEK ON MONDAY- WEDNESDAY- FRIDAY FOR SEVERE VITAMIN D. DEFICIENCY   No facility-administered encounter medications on file as of 05/24/2023.    Past  Medical History:  Diagnosis Date   ADD (attention deficit disorder)    Anxiety    Depression    GERD (gastroesophageal reflux disease)    Hyperlipidemia    Hypertension    Hypothyroid    PONV (postoperative nausea and vomiting)    Vitamin D deficiency     Past Surgical History:  Procedure Laterality Date   BREAST BIOPSY Left 07/09/2019   CESAREAN SECTION     ESOPHAGOGASTRODUODENOSCOPY N/A 09/02/2015   Procedure:  ESOPHAGOGASTRODUODENOSCOPY (EGD);  Surgeon: Carman Ching, MD;  Location: [facility] ENDOSCOPY;  Service: Endoscopy;  Laterality: N/A;   ESOPHAGOGASTRODUODENOSCOPY N/A 06/05/2022   Procedure: ESOPHAGOGASTRODUODENOSCOPY (EGD);  Surgeon: Willis Modena, MD;  Location: [facility];  Service: Gastroenterology;  Laterality: N/A;   FOOT SURGERY Right    FOREIGN BODY REMOVAL N/A 06/05/2022   Procedure: FOREIGN BODY REMOVAL;  Surgeon: Willis Modena, MD;  Location: [facility] ENDOSCOPY;  Service: Gastroenterology;  Laterality: N/A;   IM NAILING TIBIA Left 01/09/2001   Dr. Doristine Section, [facility]   TONSILLECTOMY  2002    Family History  Problem Relation Age of Onset   Breast cancer Mother        43s   Cancer Mother 55       Breast   Heart disease Father    Hypertension Father    Breast cancer Cousin        twice 28, 49    Social History   Socioeconomic History   Marital status: Divorced    Spouse name: Not on file   Number of children: Not on file   Years of education: Not on file   Highest education level: Bachelor's degree (e.g., BA, AB, BS)  Occupational History   Not on file  Tobacco Use   Smoking status: Never   Smokeless tobacco: Never  Substance and Sexual Activity   Alcohol use: Yes    Comment: occasional   Drug use: No   Sexual activity: Not on file  Other Topics Concern   Not on file  Social History Narrative   Not on file   Social Drivers of Health   Financial Resource Strain: Medium Risk (05/21/2023)   Overall Financial Resource Strain (CARDIA)    Difficulty of Paying Living Expenses: Somewhat hard  Food Insecurity: Patient Declined (05/21/2023)   Hunger Vital Sign    Worried About Running Out of Food in the Last Year: Patient declined    Ran Out of Food in the Last Year: Patient declined  Transportation Needs: No Transportation Needs (05/21/2023)   PRAPARE - Administrator, Civil Service (Medical): No    Lack of Transportation (Non-Medical): No  Physical  Activity: Sufficiently Active (05/21/2023)   Exercise Vital Sign    Days of Exercise per Week: 4 days    Minutes of Exercise per Session: 40 min  Stress: Stress Concern Present (05/21/2023)   [employer] of Occupational Health - Occupational Stress Questionnaire    Feeling of Stress : To some extent  Social Connections: Unknown (05/21/2023)   Social Connection and Isolation Panel [NHANES]    Frequency of Communication with Friends and Family: Never    Frequency of Social Gatherings with Friends and Family: Patient declined    Attends Religious Services: More than 4 times per year    Active Member of [employer] or Organizations: Yes    Attends Banker Meetings: Patient declined    Marital Status: Divorced  Intimate Partner Violence: Not At Risk (06/28/2022)   Humiliation, Afraid,  Rape, and Kick questionnaire    Fear of Current or Ex-Partner: No    Emotionally Abused: No    Physically Abused: No    Sexually Abused: No    ROS Per HPI      Objective    BP 113/76 (BP Location: Left Arm, Patient Position: Sitting) Comment: home  Pulse 82   Temp 98 F (36.7 C) (Temporal)   Ht 5\' 1"  (1.549 m)   Wt 152 lb 3.2 oz (69 kg)   SpO2 98%   BMI 28.76 kg/m   Physical Exam Vitals and nursing note reviewed.  Constitutional:      General: She is not in acute distress.    Appearance: Normal appearance.  HENT:     Head: Normocephalic and atraumatic.     Right Ear: External ear normal.     Left Ear: External ear normal.     Nose: Nose normal.     Mouth/Throat:     Mouth: Mucous membranes are moist.     Pharynx: Oropharynx is clear.  Eyes:     Extraocular Movements: Extraocular movements intact.     Pupils: Pupils are equal, round, and reactive to light.  Cardiovascular:     Rate and Rhythm: Normal rate and regular rhythm.     Pulses: Normal pulses.     Heart sounds: Normal heart sounds.  Pulmonary:     Effort: Pulmonary effort is normal. No respiratory distress.      Breath sounds: Normal breath sounds. No wheezing, rhonchi or rales.  Musculoskeletal:        General: Normal range of motion.     Cervical back: Normal range of motion.     Right lower leg: No edema.     Left lower leg: No edema.  Lymphadenopathy:     Cervical: No cervical adenopathy.  Neurological:     General: No focal deficit present.     Mental Status: She is alert and oriented to person, place, and time.  Psychiatric:        Mood and Affect: Mood normal.        Thought Content: Thought content normal.         Assessment & Plan:   Primary hypertension Assessment & Plan: Continue instructions for cardiology blood pressure parameters. Take hydralazine as needed  Orders: -     Comprehensive metabolic panel with GFR; Future -     CBC with Differential/Platelet; Future  Obstructive sleep apnea Assessment & Plan: Continue CPAP, follow-up with sleep medicine.  Orders: -     Comprehensive metabolic panel with GFR; Future -     CBC with Differential/Platelet; Future  Gastroesophageal reflux disease without esophagitis Assessment & Plan: Continue omeprazole, continue to follow-up with GI as scheduled   Hypothyroidism, unspecified type Assessment & Plan: Continue current levothyroxine.  I have ordered labs, we will adjust dose as needed  Orders: -     TSH; Future  Prediabetes Assessment & Plan: A1c ordered today, continue efforts and low sweets, low-carb diet and healthy activity level  Orders: -     Hemoglobin A1c; Future  Medication management Assessment & Plan: Continue current medication regimen. I have ordered labs for you.  Please come back and have them drawn when I have been fasting for 6 hours. We will dose adjust as needed based on results  Orders: -     Lipid panel; Future -     Comprehensive metabolic panel with GFR; Future -     CBC with  Differential/Platelet; Future -     Hemoglobin A1c; Future -     TSH; Future -     Ambulatory referral to  Physical Therapy -     Vitamin D (Ergocalciferol); TAKE 1 CAPSULE BY MOUTH 3 TIMES A WEEK ON MONDAY- WEDNESDAY- FRIDAY FOR SEVERE VITAMIN D. DEFICIENCY  Dispense: 36 capsule; Refill: 3  Attention or concentration deficit Assessment & Plan: Continue Adderall as prescribed. Refilled x 3 months today   Anxiety Assessment & Plan: Will trial lorazepam, follow-up with me as needed with this medication.  If this is not helping, please let me know  Orders: -     LORazepam; Take 1 tablet (0.5 mg total) by mouth at bedtime as needed for anxiety or sleep.  Dispense: 30 tablet; Refill: 1  Mixed hyperlipidemia Assessment & Plan: Continue efforts in low-fat diet and healthy activity level  Orders: -     Lipid panel; Future  Pelvic floor dysfunction in female -     Ambulatory referral to Physical Therapy  Vitamin D deficiency -     Vitamin D (Ergocalciferol); TAKE 1 CAPSULE BY MOUTH 3 TIMES A WEEK ON MONDAY- WEDNESDAY- FRIDAY FOR SEVERE VITAMIN D. DEFICIENCY  Dispense: 36 capsule; Refill: 3  Attention deficit hyperactivity disorder (ADHD), combined type Assessment & Plan: Continue Adderall as prescribed. Refilled x 3 months today  Orders: -     Amphetamine-Dextroamphetamine; Take 1/2 - 1 tab daily. Or 1/2 tab BID with driving long distances  Dispense: 30 tablet; Refill: 0 -     Amphetamine-Dextroamphetamine; Take 1/2 - 1 tab daily. Or 1/2 tab BID with driving long distances  Dispense: 30 tablet; Refill: 0 -     Amphetamine-Dextroamphetamine; Take 1/2 - 1 tab daily. Or 1/2 tab BID with driving long distances  Dispense: 30 tablet; Refill: 0     Return in about 6 months (around 11/23/2023) for physical.   Sherald Barge, FNP

## 2023-05-24 NOTE — Patient Instructions (Addendum)
 Welcome to [facility]!  Thank you for choosing [place] for your Primary Care needs.   We offer in person and video appointments for your convenience. You may call our office to schedule appointments, or you may schedule appointments with me through MyChart.   The best way to get in contact with me is via MyChart message. This will get to me faster than a phone call, unless there is an emergency, then please call 911.  The lab is located downstairs in the Sports Medicine building, we also have xray available there.   I have ordered future labs, our labs is open from 8am-4:30pm Monday through Friday.  Let me know if you change your  mind about genetic testing, the company is called [facility].  Follow up with me in 6 mos for follow up.

## 2023-05-24 NOTE — Assessment & Plan Note (Signed)
 Continue current levothyroxine.  I have ordered labs, we will adjust dose as needed

## 2023-05-24 NOTE — Assessment & Plan Note (Signed)
 Continue Adderall as prescribed. Refilled x 3 months today

## 2023-05-24 NOTE — Progress Notes (Deleted)
   Acute Office Visit  Subjective:     Patient ID: LEDONNA DORMER, female    DOB: Sep 07, 1950, 73 y.o.   MRN: [id number]  No chief complaint on file.   HPI Patient is in today for evaluation of ***, for the last ***. Has tried Denies known sick contacts, Denies abdominal pain, nausea, vomiting, diarrhea, rash, fever, chills, other symptoms.  Medical hx as outlined below.  ROS Per HPI      Objective:    There were no vitals taken for this visit.   Physical Exam Vitals and nursing note reviewed.  Constitutional:      General: She is not in acute distress.    Comments: Appears fatigued  HENT:     Head: Normocephalic and atraumatic.     Right Ear: External ear normal.     Left Ear: External ear normal.     Nose: No congestion.     Mouth/Throat:     Mouth: Mucous membranes are moist.     Pharynx: Oropharynx is clear. No oropharyngeal exudate or posterior oropharyngeal erythema.  Eyes:     Extraocular Movements: Extraocular movements intact.  Cardiovascular:     Rate and Rhythm: Normal rate and regular rhythm.     Heart sounds: Normal heart sounds.  Pulmonary:     Effort: Pulmonary effort is normal. No respiratory distress.     Breath sounds: No wheezing, rhonchi or rales.  Musculoskeletal:     Cervical back: Normal range of motion and neck supple.  Lymphadenopathy:     Cervical: No cervical adenopathy.  Skin:    General: Skin is warm and dry.  Neurological:     General: No focal deficit present.     Mental Status: She is alert and oriented to person, place, and time.   No results found for any visits on 05/24/23.      Assessment & Plan:   There are no diagnoses linked to this encounter.   No orders of the defined types were placed in this encounter.   No follow-ups on file.  Sherald Barge, FNP

## 2023-05-24 NOTE — Assessment & Plan Note (Signed)
 A1c ordered today, continue efforts and low sweets, low-carb diet and healthy activity level

## 2023-05-24 NOTE — Assessment & Plan Note (Signed)
 Continue omeprazole, continue to follow-up with GI as scheduled

## 2023-05-24 NOTE — Assessment & Plan Note (Signed)
 Will trial lorazepam, follow-up with me as needed with this medication.  If this is not helping, please let me know

## 2023-05-24 NOTE — Assessment & Plan Note (Signed)
 Continue current medication regimen. I have ordered labs for you.  Please come back and have them drawn when I have been fasting for 6 hours. We will dose adjust as needed based on results

## 2023-05-25 ENCOUNTER — Encounter: Attending: Family Medicine | Admitting: Physical Therapy

## 2023-05-25 ENCOUNTER — Encounter: Payer: Self-pay | Admitting: Physical Therapy

## 2023-05-25 ENCOUNTER — Other Ambulatory Visit: Payer: Self-pay

## 2023-05-25 DIAGNOSIS — Z79899 Other long term (current) drug therapy: Secondary | ICD-10-CM | POA: Insufficient documentation

## 2023-05-25 DIAGNOSIS — R278 Other lack of coordination: Secondary | ICD-10-CM | POA: Diagnosis not present

## 2023-05-25 DIAGNOSIS — R102 Pelvic and perineal pain: Secondary | ICD-10-CM | POA: Insufficient documentation

## 2023-05-25 DIAGNOSIS — M6289 Other specified disorders of muscle: Secondary | ICD-10-CM | POA: Insufficient documentation

## 2023-05-25 DIAGNOSIS — R252 Cramp and spasm: Secondary | ICD-10-CM | POA: Diagnosis not present

## 2023-05-25 NOTE — Patient Instructions (Signed)
Moisturizers They are used in the vagina to hydrate the mucous membrane that make up the vaginal canal. Designed to keep a more normal acid balance (ph) Once placed in the vagina, it will last between two to three days.  Use 2-3 times per week at bedtime  Ingredients to avoid is glycerin and fragrance, can increase chance of infection Should not be used just before sex due to causing irritation Most are gels administered either in a tampon-shaped applicator or as a vaginal suppository. They are non-hormonal.   Types of Moisturizers(internal use)  Vitamin E vaginal suppositories- Whole foods, Amazon Moist Again Coconut oil- can break down condoms, any grocery store (prefer organic) Julva- (Do no use if taking  Tamoxifen) amazon Yes moisturizer- amazon NeuEve Silk , NeuEve Silver for menopausal or over 65 (if have severe vaginal atrophy or cancer treatments use NeuEve Silk for  1 month than move to [employer])- [employer], [place].com Olive and Bee intimate cream- www.oliveandbee.com.au Mae vaginal moisturizer- Amazon Aloe Good Clean Love Hyaluronic acid Hyalofemme Reveree hyaluronic acid inserts   Creams to use externally on the Vulva area Marathon Oil (good for for cancer patients that had radiation to the area)- [place] or [employer].https://garcia-valdez.org/ Vulva Balm/ V-magic cream by medicine mama- amazon Julva-amazon Vital "V Wild Yam salve ( help moisturize and help with thinning vulvar area, does have Beeswax MoodMaid Botanical Pro-Meno Wild Yam Cream- Amazon Desert Harvest Gele Cleo by Zane Herald labial moisturizer (Amazon),  Coconut or olive oil aloe Good Clean Love Enchanted Rose by intimate rose  Things to avoid in the vaginal area Do not use things to irritate the vulvar area No lotions just specialized creams for the vulva area- Neogyn, V-magic,  No soaps; can use Aveeno or Calendula cleanser, unscented Dove if needed. Must be gentle No deodorants No douches Good to  sleep without underwear to let the vaginal area to air out No scrubbing: spread the lips to let warm water rinse over labias and pat dry   Eulis Foster, PT [facility] [address] [place], [place] [address] W: [phone] [email or web address]

## 2023-05-25 NOTE — Therapy (Signed)
 OUTPATIENT PHYSICAL THERAPY FEMALE PELVIC EVALUATION   Patient Name: Diana Wolfe MRN: [id number] DOB:1950-08-26, 73 y.o., female Today's Date: 05/25/2023  END OF SESSION:  PT End of Session - 05/25/23 1422     Visit Number 1    Date for PT Re-Evaluation 08/17/23    Authorization Type Medicare/ [employer]    Authorization - Visit Number 1    Authorization - Number of Visits 10    PT Start Time 1410    PT Stop Time 1455    PT Time Calculation (min) 45 min    Activity Tolerance Patient tolerated treatment well    Behavior During Therapy WFL for tasks assessed/performed             Past Medical History:  Diagnosis Date   ADD (attention deficit disorder)    Anxiety    Depression    GERD (gastroesophageal reflux disease)    Hyperlipidemia    Hypertension    Hypothyroid    PONV (postoperative nausea and vomiting)    Vitamin D deficiency    Past Surgical History:  Procedure Laterality Date   BREAST BIOPSY Left 07/09/2019   CESAREAN SECTION     ESOPHAGOGASTRODUODENOSCOPY N/A 09/02/2015   Procedure: ESOPHAGOGASTRODUODENOSCOPY (EGD);  Surgeon: Carman Ching, MD;  Location: [facility] ENDOSCOPY;  Service: Endoscopy;  Laterality: N/A;   ESOPHAGOGASTRODUODENOSCOPY N/A 06/05/2022   Procedure: ESOPHAGOGASTRODUODENOSCOPY (EGD);  Surgeon: Willis Modena, MD;  Location: [facility] ENDOSCOPY;  Service: Gastroenterology;  Laterality: N/A;   FOOT SURGERY Right    FOREIGN BODY REMOVAL N/A 06/05/2022   Procedure: FOREIGN BODY REMOVAL;  Surgeon: Willis Modena, MD;  Location: [facility] ENDOSCOPY;  Service: Gastroenterology;  Laterality: N/A;   IM NAILING TIBIA Left 01/09/2001   Dr. 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   Dysphagia 04/21/2020   Generalized abdominal pain  04/21/2020   Hematochezia 04/21/2020   Left lower quadrant pain 04/21/2020   History of colonic polyps 04/21/2020   Urticaria 04/21/2020   B12 deficiency 01/31/2019   Medication management 06/23/2014   Vaginal atrophy 03/27/2014   Hyperlipidemia    Hypertension    Anxiety    GERD (gastroesophageal reflux disease)    ADD (attention deficit disorder)    Depression    Vitamin D deficiency, unspecified    Hypothyroidism 04/25/2008   Iron deficiency anemia 04/25/2008   Obstructive sleep apnea 11/16/2007   Allergic rhinitis 11/16/2007   Headache 11/16/2007   PULMONARY EMBOLISM, HX OF 11/16/2007    PCP: Sherald Barge, FNP  REFERRING PROVIDER: Sherald Barge, FNP   REFERRING DIAG:  [phone] (ICD-10-CM) - Medication management  M62.89 (ICD-10-CM) - Pelvic floor dysfunction in female    THERAPY DIAG:  Cramp and spasm  Other lack of coordination  Pelvic pain  Rationale for Evaluation and Treatment: Rehabilitation  ONSET DATE: 2021  SUBJECTIVE:  SUBJECTIVE STATEMENT: Patient has to urinate frequently. Urgency. Last female exam was very painful. Is not sexually active but feels if she is there will be pain. Patient has not used any estrogen creams vaginally. 2 hour drive will have to go to the bathroom. Patient has had a number of UTI's.  Fluid intake: water; tea  PAIN:  Are you having pain? Yes NPRS scale: 7/10 Pain location: Vaginal  Pain type: aching Pain description: intermittent   Aggravating factors: during vaginal penetration Relieving factors: no vaginal penetration  PRECAUTIONS: None  RED FLAGS: None   WEIGHT BEARING RESTRICTIONS: No  FALLS:  Has patient fallen in last 6 months? Yes. Number of falls 1 time slipped on ice.   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  URINATION: Pain with urination: No Fully empty bladder: Yes: sometimes will empty her bladder and 10 minutes have to urinate again Stream: Strong Urgency: Yes , getting out of car, sitting in house Frequency: night time 2-3 times: daytime: 6 times Leakage: Urge to void and Walking to the bathroom Pads: No  INTERCOURSE:not sexually active at this time but in the future would like to be able to.    PREGNANCY: C-section deliveries 1  PROLAPSE: None   OBJECTIVE:  Note: Objective measures were completed at Evaluation unless otherwise noted.  DIAGNOSTIC FINDINGS:  none  PATIENT SURVEYS:  UIQ-7: 48 PFIQ-7: 44  COGNITION: Overall cognitive status: Within functional limits for tasks assessed     SENSATION: Light touch: Appears intact   POSTURE: decreased lumbar lordosis   LUMBARAROM/PROM:  A/PROM A/PROM  eval  Extension Decreased by 50%   (Blank rows = not tested)  LOWER EXTREMITY ROM: bilateral hip ROM is full   LOWER EXTREMITY MMT:  MMT Right eval Left eval  Hip extension 4/5 4/5  Hip abduction 4/5 5/5   (Blank rows = not tested) PALPATION:    Pelvic Alignment: ASIS  Abdominal: decreased mobility of the tissue below the scar, decreased movement of the diaphragm, difficulty with opening her lower rib cage                External Perineal Exam: dry, redness along the vulva, white patch of skin along the inner left labia majora; Q-tip test was positive at 6, 11 and 1 O'Clock.                              Internal Pelvic Floor: pain with Q-tip therefore did not assess any further, when she bulged therapist did not see any prolapse, the vaginal canal was  small  Patient confirms identification and approves PT to assess internal pelvic floor and treatment Yes  PELVIC MMT:not tested due to the pain   MMT eval  Vaginal   Internal Anal Sphincter   External Anal Sphincter   Puborectalis   Diastasis Recti   (Blank rows = not tested)         TODAY'S TREATMENT:  DATE: 05/25/23  EVAL Examination completed, findings reviewed, pt educated on POC, HEP, and female pelvic floor anatomy, reasoning with pelvic floor assessment internally with pt consent, and vaginal moisturizers. Pt motivated to participate in PT and agreeable to attempt recommendations.     PATIENT EDUCATION:  Education details: vaginal  moisturizers, how they work and how they improve the health of the tissue Person educated: Patient Education method: Explanation, Demonstration, Tactile cues, Verbal cues, and Handouts Education comprehension: verbalized understanding, returned demonstration, verbal cues required, tactile cues required, and needs further education  HOME EXERCISE PROGRAM: See above  ASSESSMENT:  CLINICAL IMPRESSION: Patient is a 73 y.o. female who was seen today for physical therapy evaluation and treatment for pelvis floor dysfunction.   She started to have issues 4 years ago. She has 7/10 pain with her last vaginal exam 10 years ago. Patient had pain when the q-tip was inserted into the vaginal canal. Her vaginal canal was small. She had dryness and redness along the vulvar area. Q-tip test was positive  at 6, 11 and 1 O'Clock. Unable to assess internally for strength or muscle trigger points due to the pain and size of the vaginal canal. She has a white patch of skin along the inner left labia majora. Night time she will urinate 2-3 times. She will leak urine with urge to void and walking to the bathroom. Patient has a strong urgency when she  getting out of the car or siting at home. She sometimes will empty her bladder and 10 minutes have to urinate again. Patient will benefit from skilled therapy to improve pelvic floor tissue health, elongation of the vaginal canal and working on coordination to reduce urgency.   OBJECTIVE IMPAIRMENTS: decreased coordination, decreased ROM, decreased strength, increased fascial restrictions, increased muscle spasms, impaired tone, and pain.   ACTIVITY LIMITATIONS: continence, toileting, and locomotion level  PARTICIPATION LIMITATIONS: interpersonal relationship, driving, shopping, and community activity  PERSONAL FACTORS: Fitness, Time since onset of injury/illness/exacerbation, and 1-2 comorbidities: Hypothyroid; Hypertension; Cesarean section  are also affecting patient's functional outcome.   REHAB POTENTIAL: Good  CLINICAL DECISION MAKING: Evolving/moderate complexity  EVALUATION COMPLEXITY: Moderate   GOALS: Goals reviewed with patient? Yes  SHORT TERM GOALS: Target date: 06/22/23  Patient educated on vaginal moisturizers and understands how they are used and why to improve the health of the vaginal tissue.  Baseline: Goal status: INITIAL  2.  Patient is able to perform diaphragmatic breathing to elongate her pelvic floor.  Baseline:  Goal status: INITIAL  3.  Patient understands was to perform perineal massage externally to start to relax the pelvic floor tissue.  Baseline:  Goal status: INITIAL  4.  Patient is independent with flexibility exercises to indirectly elongate the pelvic floor.  Baseline:  Goal status: INITIAL  5.  Patient educated on vaginal dilators to assist with the expansion of the pelvic floor.  Baseline:  Goal status: INITIAL   LONG TERM GOALS: Target date: 08/17/23  Patient independent with advanced HEP to increase strength of core and pelvic floor.  Baseline:  Goal status: INITIAL  2.  Patient is able to use the dilator that is appropriate for  her goal to have vaginal penetration with pain level </= 0-2/10.  Baseline:  Goal status: INITIAL  3.  Patient pelvic floor strength increased to reduce urinary leakage with urgency.  Baseline:  Goal status: INITIAL  4.  Patient is able to use the behavioral technique to reduce the urge to void so she is  able to slowly walk to the bathroom without leaking urine and enter her house.  Baseline:  Goal status: INITIAL  5.  Patient reports she is able to urinate 1-2 times at night due to using the urge to void technique.  Baseline:  Goal status: INITIAL   PLAN:  PT FREQUENCY: 1-2x/week  PT DURATION: 12 weeks  PLANNED INTERVENTIONS: 97110-Therapeutic exercises, 97530- Therapeutic activity, 97112- Neuromuscular re-education, 97535- Self Care, 16109- Manual therapy, G0283- Electrical stimulation (unattended), 97035- Ultrasound, Patient/Family education, Dry Needling, Joint mobilization, Spinal mobilization, Scar mobilization, Cryotherapy, Moist heat, and Biofeedback  PLAN FOR NEXT SESSION: manual work to the abdomen to work on diaphragmatic breathing, see about the vaginal moisturizers, sit on a ball to massage the pelvic floor, hip stretches   Eulis Foster, PT 05/25/23 4:46 PM

## 2023-05-25 NOTE — Telephone Encounter (Signed)
 Per Nida Boatman at [employer]- Ms. Freida Busman should be reaching out to PT in regards to her mask to get everything set for the order. NFN at this time.

## 2023-05-29 ENCOUNTER — Ambulatory Visit: Payer: Self-pay | Attending: Family Medicine | Admitting: Physical Therapy

## 2023-05-29 ENCOUNTER — Encounter: Payer: Self-pay | Admitting: Physical Therapy

## 2023-05-29 DIAGNOSIS — R102 Pelvic and perineal pain: Secondary | ICD-10-CM | POA: Diagnosis not present

## 2023-05-29 DIAGNOSIS — R278 Other lack of coordination: Secondary | ICD-10-CM | POA: Diagnosis not present

## 2023-05-29 DIAGNOSIS — R252 Cramp and spasm: Secondary | ICD-10-CM | POA: Diagnosis not present

## 2023-05-29 NOTE — Therapy (Signed)
 OUTPATIENT PHYSICAL THERAPY FEMALE PELVIC TREATMENT   Patient Name: Diana Wolfe MRN: [id number] DOB:1950-07-20, 73 y.o., female Today's Date: 05/29/2023  END OF SESSION:  PT End of Session - 05/29/23 1621     Visit Number 2    Date for PT Re-Evaluation 08/17/23    Authorization Type Medicare/ [employer]    Authorization - Visit Number 2    Authorization - Number of Visits 10    PT Start Time 1615    PT Stop Time 1655    PT Time Calculation (min) 40 min    Activity Tolerance Patient tolerated treatment well    Behavior During Therapy WFL for tasks assessed/performed             Past Medical History:  Diagnosis Date   ADD (attention deficit disorder)    Anxiety    Depression    GERD (gastroesophageal reflux disease)    Hyperlipidemia    Hypertension    Hypothyroid    PONV (postoperative nausea and vomiting)    Vitamin D deficiency    Past Surgical History:  Procedure Laterality Date   BREAST BIOPSY Left 07/09/2019   CESAREAN SECTION     ESOPHAGOGASTRODUODENOSCOPY N/A 09/02/2015   Procedure: ESOPHAGOGASTRODUODENOSCOPY (EGD);  Surgeon: Carman Ching, MD;  Location: [facility] ENDOSCOPY;  Service: Endoscopy;  Laterality: N/A;   ESOPHAGOGASTRODUODENOSCOPY N/A 06/05/2022   Procedure: ESOPHAGOGASTRODUODENOSCOPY (EGD);  Surgeon: Willis Modena, MD;  Location: [facility] ENDOSCOPY;  Service: Gastroenterology;  Laterality: N/A;   FOOT SURGERY Right    FOREIGN BODY REMOVAL N/A 06/05/2022   Procedure: FOREIGN BODY REMOVAL;  Surgeon: Willis Modena, MD;  Location: [facility] ENDOSCOPY;  Service: Gastroenterology;  Laterality: N/A;   IM NAILING TIBIA Left 01/09/2001   Dr. 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   Dysphagia 04/21/2020   Generalized abdominal pain 04/21/2020    Hematochezia 04/21/2020   Left lower quadrant pain 04/21/2020   History of colonic polyps 04/21/2020   Urticaria 04/21/2020   B12 deficiency 01/31/2019   Medication management 06/23/2014   Vaginal atrophy 03/27/2014   Hyperlipidemia    Hypertension    Anxiety    GERD (gastroesophageal reflux disease)    ADD (attention deficit disorder)    Depression    Vitamin D deficiency, unspecified    Hypothyroidism 04/25/2008   Iron deficiency anemia 04/25/2008   Obstructive sleep apnea 11/16/2007   Allergic rhinitis 11/16/2007   Headache 11/16/2007   PULMONARY EMBOLISM, HX OF 11/16/2007    PCP: Sherald Barge, FNP  REFERRING PROVIDER: Sherald Barge, FNP   REFERRING DIAG:  [phone] (ICD-10-CM) - Medication management  M62.89 (ICD-10-CM) - Pelvic floor dysfunction in female    THERAPY DIAG:  Cramp and spasm  Other lack of coordination  Pelvic pain  Rationale for Evaluation and Treatment: Rehabilitation  ONSET DATE: 2021  SUBJECTIVE:  SUBJECTIVE STATEMENT: I have ordered the moisturizers.  Fluid intake: water; tea  PAIN:  Are you having pain? Yes NPRS scale: 7/10 Pain location: Vaginal  Pain type: aching Pain description: intermittent   Aggravating factors: during vaginal penetration Relieving factors: no vaginal penetration  PRECAUTIONS: None  RED FLAGS: None   WEIGHT BEARING RESTRICTIONS: No  FALLS:  Has patient fallen in last 6 months? Yes. Number of falls 1 time slipped on ice.   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  URINATION: Pain with urination: No Fully empty bladder: Yes: sometimes will empty her bladder and 10 minutes have to urinate again Stream: Strong Urgency: Yes , getting out of car, sitting in house Frequency: night time 2-3 times: daytime: 6 times Leakage: Urge to void and Walking to the bathroom Pads: No  INTERCOURSE:not sexually active at this time but in the future would like to be able to.    PREGNANCY: C-section deliveries 1  PROLAPSE: None   OBJECTIVE:  Note: Objective measures were completed at Evaluation unless otherwise noted.  DIAGNOSTIC FINDINGS:  none  PATIENT SURVEYS:  UIQ-7: 48 PFIQ-7: 44  COGNITION: Overall cognitive status: Within functional limits for tasks assessed     SENSATION: Light touch: Appears intact   POSTURE: decreased lumbar lordosis   LUMBARAROM/PROM:  A/PROM A/PROM  eval  Extension Decreased by 50%   (Blank rows = not tested)  LOWER EXTREMITY ROM: bilateral hip ROM is full   LOWER EXTREMITY MMT:  MMT Right eval Left eval  Hip extension 4/5 4/5  Hip abduction 4/5 5/5   (Blank rows = not tested) PALPATION:    Pelvic Alignment: ASIS  Abdominal: decreased mobility of the tissue below the scar, decreased movement of the diaphragm, difficulty with opening her lower rib cage                External Perineal Exam: dry, redness along the vulva, white patch of skin along the inner left labia majora; Q-tip test was positive at 6, 11 and 1 O'Clock.                              Internal Pelvic Floor: pain with Q-tip therefore did not assess any further, when she bulged therapist did not see any prolapse, the vaginal canal was small  Patient confirms identification and approves PT to assess internal pelvic floor and treatment Yes  PELVIC MMT:not tested due to the pain   MMT eval  Vaginal   Internal Anal Sphincter   External Anal Sphincter   Puborectalis   Diastasis Recti    (Blank rows = not tested)         TODAY'S TREATMENT:    05/29/23 Manual: Soft tissue mobilization: Manual work to the diaphragm to work on diaphragmatic breathing Manual work to the hip adductors then the first layer of the pelvic floor through her clothes Manual work to the thoracic lumbar paraspinals to elongate the tissue and work on diaphragmatic breathing Neuromuscular re-education: Down training: Diaphragmatic breathing in supine, childs pose, and in sitting to work on relaxing the pelvic floor with tactile cues to open the back up  Exercises: Stretches/mobility: Piriformis stretch holding 30 sec bil.  Happy baby in sitting holding 30 sec Posterior hip stretch in supine hold 30 sec bil. In supine                                                                                                                              DATE: 05/25/23  EVAL Examination completed, findings reviewed, pt educated on POC, HEP, and female pelvic floor anatomy, reasoning with pelvic floor assessment internally with pt consent, and vaginal moisturizers. Pt motivated to participate in PT and agreeable to attempt recommendations.     PATIENT EDUCATION:  05/29/23 Education details: vaginal  moisturizers, how they work and how they improve the health of the tissue, Access Code: [email or web address] Person educated: Patient Education method: Explanation, Demonstration, Tactile cues, Verbal cues, and Handouts Education comprehension: verbalized understanding, returned demonstration, verbal cues required, tactile cues required, and needs further education  HOME EXERCISE PROGRAM: 05/29/23 Access Code: [email or web address] URL: [email or web address] Date: 05/29/2023 Prepared by: Eulis Foster  Program Notes sit on ball and massage the pelvic floor daily for several minutes.   Exercises - Seated Diaphragmatic Breathing  - 1 x daily - 7 x weekly - 3 sets - 10 reps - Seated Piriformis Stretch with Trunk Bend  - 1 x  daily - 7 x weekly - 1 sets - 2 reps - 30 sec hold - Seated Happy Baby With Trunk Flexion For Pelvic Relaxation  - 1 x daily - 7 x weekly - 1 sets - 1 reps - 30 sec hold - Supine Piriformis Stretch with Leg Straight  - 1 x daily - 7 x weekly - 1 sets - 2 reps - 30 sec hold  ASSESSMENT:  CLINICAL IMPRESSION: Patient is a 73 y.o. female who was seen today for physical therapy  treatment for pelvis floor dysfunction.   Patient has ordered the vaginal moisturizers. Patient is not able to feel the pelvic floor relax. Patient is learning how to elongate the pelvic floor with stretches.  Patient will benefit from skilled therapy to improve pelvic floor tissue health, elongation of the vaginal canal and working on coordination to reduce urgency.   OBJECTIVE IMPAIRMENTS: decreased coordination, decreased ROM, decreased strength, increased fascial restrictions, increased muscle spasms, impaired tone, and pain.   ACTIVITY LIMITATIONS: continence, toileting, and locomotion level  PARTICIPATION LIMITATIONS: interpersonal relationship, driving, shopping, and community activity  PERSONAL FACTORS: Fitness, Time since onset of injury/illness/exacerbation, and 1-2 comorbidities: Hypothyroid; Hypertension; Cesarean section  are also affecting patient's functional outcome.   REHAB POTENTIAL: Good  CLINICAL DECISION MAKING: Evolving/moderate complexity  EVALUATION COMPLEXITY: Moderate   GOALS: Goals reviewed with patient? Yes  SHORT TERM GOALS: Target date: 06/22/23  Patient educated on vaginal moisturizers and understands how they are used and why to improve the health of the vaginal tissue.  Baseline: Goal status: Met 05/29/23  2.  Patient is able to perform diaphragmatic breathing to elongate her pelvic floor.  Baseline:  Goal status: INITIAL  3.  Patient understands  was to perform perineal massage externally to start to relax the pelvic floor tissue.  Baseline:  Goal status: INITIAL  4.  Patient  is independent with flexibility exercises to indirectly elongate the pelvic floor.  Baseline:  Goal status: INITIAL  5.  Patient educated on vaginal dilators to assist with the expansion of the pelvic floor.  Baseline:  Goal status: INITIAL   LONG TERM GOALS: Target date: 08/17/23  Patient independent with advanced HEP to increase strength of core and pelvic floor.  Baseline:  Goal status: INITIAL  2.  Patient is able to use the dilator that is appropriate for her goal to have vaginal penetration with pain level </= 0-2/10.  Baseline:  Goal status: INITIAL  3.  Patient pelvic floor strength increased to reduce urinary leakage with urgency.  Baseline:  Goal status: INITIAL  4.  Patient is able to use the behavioral technique to reduce the urge to void so she is able to slowly walk to the bathroom without leaking urine and enter her house.  Baseline:  Goal status: INITIAL  5.  Patient reports she is able to urinate 1-2 times at night due to using the urge to void technique.  Baseline:  Goal status: INITIAL   PLAN:  PT FREQUENCY: 1-2x/week  PT DURATION: 12 weeks  PLANNED INTERVENTIONS: 97110-Therapeutic exercises, 97530- Therapeutic activity, 97112- Neuromuscular re-education, 97535- Self Care, 40981- Manual therapy, G0283- Electrical stimulation (unattended), 97035- Ultrasound, Patient/Family education, Dry Needling, Joint mobilization, Spinal mobilization, Scar mobilization, Cryotherapy, Moist heat, and Biofeedback  PLAN FOR NEXT SESSION:  diaphragmatic breathing, see about the vaginal moisturizers,  hip stretches, work on the first layer of the pelvic floor   Eulis Foster, PT 05/29/23 5:09 PM

## 2023-05-30 ENCOUNTER — Encounter: Payer: Self-pay | Admitting: Family Medicine

## 2023-05-30 DIAGNOSIS — N760 Acute vaginitis: Secondary | ICD-10-CM

## 2023-05-30 MED ORDER — ESTRADIOL 0.5 MG/0.5GM TD GEL: TRANSDERMAL | 1 refills | Status: DC

## 2023-06-05 ENCOUNTER — Other Ambulatory Visit

## 2023-06-05 ENCOUNTER — Encounter: Payer: Self-pay | Admitting: Family Medicine

## 2023-06-05 DIAGNOSIS — Z79899 Other long term (current) drug therapy: Secondary | ICD-10-CM

## 2023-06-05 DIAGNOSIS — R7303 Prediabetes: Secondary | ICD-10-CM

## 2023-06-05 DIAGNOSIS — E782 Mixed hyperlipidemia: Secondary | ICD-10-CM | POA: Diagnosis not present

## 2023-06-05 DIAGNOSIS — I10 Essential (primary) hypertension: Secondary | ICD-10-CM

## 2023-06-05 DIAGNOSIS — E039 Hypothyroidism, unspecified: Secondary | ICD-10-CM

## 2023-06-05 DIAGNOSIS — G4733 Obstructive sleep apnea (adult) (pediatric): Secondary | ICD-10-CM | POA: Diagnosis not present

## 2023-06-05 LAB — COMPREHENSIVE METABOLIC PANEL WITH GFR
ALT: 23 U/L (ref 0–35)
AST: 21 U/L (ref 0–37)
Albumin: 4.2 g/dL (ref 3.5–5.2)
Alkaline Phosphatase: 76 U/L (ref 39–117)
BUN: 12 mg/dL (ref 6–23)
CO2: 30 meq/L (ref 19–32)
Calcium: 9.5 mg/dL (ref 8.4–10.5)
Chloride: 101 meq/L (ref 96–112)
Creatinine, Ser: 0.86 mg/dL (ref 0.40–1.20)
GFR: 68.09 mL/min (ref >60.00)
Glucose, Bld: 88 mg/dL (ref 70–99)
Potassium: 3.8 meq/L (ref 3.5–5.1)
Sodium: 137 meq/L (ref 135–145)
Total Bilirubin: 0.6 mg/dL (ref 0.2–1.2)
Total Protein: 6.4 g/dL (ref 6.0–8.3)

## 2023-06-05 LAB — LIPID PANEL
Cholesterol: 231 mg/dL — ABNORMAL HIGH (ref 0–200)
HDL: 59.1 mg/dL (ref >39.00)
LDL Cholesterol: 134 mg/dL — ABNORMAL HIGH (ref 0–99)
NonHDL: 171.9
Total CHOL/HDL Ratio: 4
Triglycerides: 192 mg/dL — ABNORMAL HIGH (ref 0.0–149.0)
VLDL: 38.4 mg/dL (ref 0.0–40.0)

## 2023-06-05 LAB — CBC WITH DIFFERENTIAL/PLATELET
Basophils Absolute: 0.1 10*3/uL (ref 0.0–0.1)
Basophils Relative: 1 % (ref 0.0–3.0)
Eosinophils Absolute: 0.3 10*3/uL (ref 0.0–0.7)
Eosinophils Relative: 3.5 % (ref 0.0–5.0)
HCT: 44.2 % (ref 36.0–46.0)
Hemoglobin: 14.5 g/dL (ref 12.0–15.0)
Lymphocytes Relative: 22.5 % (ref 12.0–46.0)
Lymphs Abs: 2.2 10*3/uL (ref 0.7–4.0)
MCHC: 32.9 g/dL (ref 30.0–36.0)
MCV: 88.6 fl (ref 78.0–100.0)
Monocytes Absolute: 0.6 10*3/uL (ref 0.1–1.0)
Monocytes Relative: 6.8 % (ref 3.0–12.0)
Neutro Abs: 6.4 10*3/uL (ref 1.4–7.7)
Neutrophils Relative %: 66.2 % (ref 43.0–77.0)
Platelets: 324 10*3/uL (ref 150.0–400.0)
RBC: 4.99 Mil/uL (ref 3.87–5.11)
RDW: 14.3 % (ref 11.5–15.5)
WBC: 9.6 10*3/uL (ref 4.0–10.5)

## 2023-06-05 LAB — TSH: TSH: 2.63 u[IU]/mL (ref 0.35–5.50)

## 2023-06-05 LAB — HEMOGLOBIN A1C: Hgb A1c MFr Bld: 6.3 % (ref 4.6–6.5)

## 2023-06-28 DIAGNOSIS — H43393 Other vitreous opacities, bilateral: Secondary | ICD-10-CM | POA: Diagnosis not present

## 2023-06-28 DIAGNOSIS — H04123 Dry eye syndrome of bilateral lacrimal glands: Secondary | ICD-10-CM | POA: Diagnosis not present

## 2023-06-28 DIAGNOSIS — H35363 Drusen (degenerative) of macula, bilateral: Secondary | ICD-10-CM | POA: Diagnosis not present

## 2023-06-28 DIAGNOSIS — H40003 Preglaucoma, unspecified, bilateral: Secondary | ICD-10-CM | POA: Diagnosis not present

## 2023-07-05 ENCOUNTER — Encounter: Payer: Self-pay | Admitting: Family Medicine

## 2023-07-05 ENCOUNTER — Other Ambulatory Visit: Payer: Self-pay | Admitting: Family Medicine

## 2023-07-05 DIAGNOSIS — F902 Attention-deficit hyperactivity disorder, combined type: Secondary | ICD-10-CM

## 2023-07-05 DIAGNOSIS — E039 Hypothyroidism, unspecified: Secondary | ICD-10-CM

## 2023-07-06 ENCOUNTER — Other Ambulatory Visit: Payer: Self-pay

## 2023-07-07 MED ORDER — LEVOTHYROXINE SODIUM 75 MCG PO TABS: ORAL_TABLET | ORAL | 1 refills | Status: DC

## 2023-07-10 ENCOUNTER — Other Ambulatory Visit: Payer: Self-pay

## 2023-07-10 MED ORDER — MELOXICAM 15 MG PO TABS: 15.0000 mg | ORAL_TABLET | Freq: Every day | ORAL | 0 refills | Status: DC

## 2023-07-12 ENCOUNTER — Ambulatory Visit: Payer: Self-pay | Admitting: Physical Therapy

## 2023-07-12 ENCOUNTER — Encounter: Payer: Self-pay | Admitting: Internal Medicine

## 2023-07-19 DIAGNOSIS — H10232 Serous conjunctivitis, except viral, left eye: Secondary | ICD-10-CM | POA: Diagnosis not present

## 2023-07-24 ENCOUNTER — Encounter: Payer: Self-pay | Admitting: Physical Therapy

## 2023-07-24 ENCOUNTER — Ambulatory Visit: Payer: Self-pay | Attending: Family Medicine | Admitting: Physical Therapy

## 2023-07-24 DIAGNOSIS — R278 Other lack of coordination: Secondary | ICD-10-CM | POA: Diagnosis not present

## 2023-07-24 DIAGNOSIS — R102 Pelvic and perineal pain: Secondary | ICD-10-CM | POA: Insufficient documentation

## 2023-07-24 DIAGNOSIS — R252 Cramp and spasm: Secondary | ICD-10-CM | POA: Diagnosis not present

## 2023-07-24 NOTE — Therapy (Signed)
 OUTPATIENT PHYSICAL THERAPY FEMALE PELVIC TREATMENT   Patient Name: Diana Wolfe MRN: [id number] DOB:1950-11-15, 73 y.o., female Today's Date: 07/24/2023  END OF SESSION:  PT End of Session - 07/24/23 1101     Visit Number 3    Date for PT Re-Evaluation 08/17/23    Authorization Type Medicare/ [employer]    Authorization - Visit Number 3    Authorization - Number of Visits 10    PT Start Time 1100    PT Stop Time 1140    PT Time Calculation (min) 40 min    Activity Tolerance Patient tolerated treatment well    Behavior During Therapy WFL for tasks assessed/performed             Past Medical History:  Diagnosis Date   ADD (attention deficit disorder)    Anxiety    Depression    GERD (gastroesophageal reflux disease)    Hyperlipidemia    Hypertension    Hypothyroid    PONV (postoperative nausea and vomiting)    Vitamin D  deficiency    Past Surgical History:  Procedure Laterality Date   BREAST BIOPSY Left 07/09/2019   CESAREAN SECTION     ESOPHAGOGASTRODUODENOSCOPY N/A 09/02/2015   Procedure: ESOPHAGOGASTRODUODENOSCOPY (EGD);  Surgeon: Jolinda Necessary, MD;  Location: [facility] ENDOSCOPY;  Service: Endoscopy;  Laterality: N/A;   ESOPHAGOGASTRODUODENOSCOPY N/A 06/05/2022   Procedure: ESOPHAGOGASTRODUODENOSCOPY (EGD);  Surgeon: Evangeline Hilts, MD;  Location: [facility] ENDOSCOPY;  Service: Gastroenterology;  Laterality: N/A;   FOOT SURGERY Right    FOREIGN BODY REMOVAL N/A 06/05/2022   Procedure: FOREIGN BODY REMOVAL;  Surgeon: Evangeline Hilts, MD;  Location: [facility] ENDOSCOPY;  Service: Gastroenterology;  Laterality: N/A;   IM NAILING TIBIA Left 01/09/2001   Dr. 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   Dysphagia 04/21/2020   Generalized abdominal pain 04/21/2020    Hematochezia 04/21/2020   Left lower quadrant pain 04/21/2020   History of colonic polyps 04/21/2020   Urticaria 04/21/2020   B12 deficiency 01/31/2019   Medication management 06/23/2014   Vaginal atrophy 03/27/2014   Hyperlipidemia    Hypertension    Anxiety    GERD (gastroesophageal reflux disease)    ADD (attention deficit disorder)    Depression    Vitamin D  deficiency, unspecified    Hypothyroidism 04/25/2008   Iron deficiency anemia 04/25/2008   Obstructive sleep apnea 11/16/2007   Allergic rhinitis 11/16/2007   Headache 11/16/2007   PULMONARY EMBOLISM, HX OF 11/16/2007    PCP: Wellington Half, FNP  REFERRING PROVIDER: Wellington Half, FNP   REFERRING DIAG:  [phone] (ICD-10-CM) - Medication management  M62.89 (ICD-10-CM) - Pelvic floor dysfunction in female    THERAPY DIAG:  Cramp and spasm  Other lack of coordination  Pelvic pain  Rationale for Evaluation and Treatment: Rehabilitation  ONSET DATE: 2021  SUBJECTIVE:  SUBJECTIVE STATEMENT: The products I am using causes burning. I am using the cream for 1 month. The moisturizers I use on the outside of the vulva area is not causing me to itch.  Fluid intake: water; tea  PAIN:  Are you having pain? Yes NPRS scale: 7/10 Pain location: Vaginal  Pain type: aching Pain description: intermittent   Aggravating factors: during vaginal penetration Relieving factors: no vaginal penetration  PRECAUTIONS: None  RED FLAGS: None   WEIGHT BEARING RESTRICTIONS: No  FALLS:  Has patient fallen in last 6 months? Yes. Number of falls 1 time slipped on ice.   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  URINATION: Pain with urination: No Fully empty bladder: Yes: sometimes will empty her bladder and 10 minutes have to urinate again Stream: Strong Urgency: Yes , getting out of car, sitting in house Frequency: night time 2-3 times: daytime: 6 times Leakage: Urge to void and Walking to the bathroom Pads: No  INTERCOURSE:not sexually active at this time but in the future would like to be able to.    PREGNANCY: C-section deliveries 1  PROLAPSE: None   OBJECTIVE:  Note: Objective measures were completed at Evaluation unless otherwise noted.  DIAGNOSTIC FINDINGS:  none  PATIENT SURVEYS:  UIQ-7: 48 PFIQ-7: 44  COGNITION: Overall cognitive status: Within functional limits for tasks assessed     SENSATION: Light touch: Appears intact   POSTURE: decreased lumbar lordosis   LUMBARAROM/PROM:  A/PROM A/PROM  eval  Extension Decreased by 50%   (Blank rows = not tested)  LOWER EXTREMITY ROM: bilateral hip ROM is full   LOWER EXTREMITY MMT:  MMT Right eval Left eval  Hip extension 4/5 4/5  Hip abduction 4/5 5/5   (Blank rows = not tested) PALPATION:    Pelvic Alignment: ASIS  Abdominal: decreased mobility of the tissue below the scar, decreased movement of the diaphragm, difficulty with opening her lower rib cage                External Perineal Exam: dry, redness along the vulva, white patch of skin along the inner left labia majora; Q-tip test was positive at 6, 11 and 1 O'Clock.                              Internal Pelvic Floor: pain with Q-tip therefore did not assess any further, when she bulged therapist did not see any prolapse, the vaginal canal was small  Patient confirms identification and approves PT to assess internal pelvic floor and treatment Yes  PELVIC MMT:not tested due  to the pain   MMT eval  Vaginal   Internal Anal Sphincter   External Anal Sphincter   Puborectalis   Diastasis Recti   (Blank rows = not tested)         TODAY'S TREATMENT:    07/24/23 Manual: Myofascial release: Fascial release  around the urogenital diaphragm and perineal body Internal pelvic floor techniques: No emotional/communication barriers or cognitive limitation. Patient is motivated to learn. Patient understands and agrees with treatment goals and plan. PT explains patient will be examined in standing, sitting, and lying down to see how their muscles and joints work.  When they are ready, they will be asked to remove their underwear so PT can examine their perineum. The patient is also given the option of providing their own chaperone as one is not provided in our facility. The patient also has the right and is explained the right to defer or refuse any part of the evaluation or treatment including the internal exam. With the patient's consent, PT will use one gloved finger to gently assess the muscles of the pelvic floor, seeing how well it contracts and relaxes and if there is muscle symmetry. After, the patient will get dressed and PT and patient will discuss exam findings and plan of care. PT and patient discuss plan of care, schedule, attendance policy and HEP activities.  Using a gloved finger going into the vaginal canal working on the introitus with one finger then used the other finger to elongate the tissue Neuromuscular re-education: Down training: Diaphragmatic breathing with therapist feeling the pelvic floor drop for 5 minutes Self-care: Discussed with patient vaginal moisturizers she is using, contact the doctor about the vaginal burning from the internal moisturizer, reviewed how to use it Verbally educated patient on how to perform manual work to the introitus to elongate the tissue and do every other day.     05/29/23 Manual: Soft tissue mobilization: Manual work  to the diaphragm to work on diaphragmatic breathing Manual work to the hip adductors then the first layer of the pelvic floor through her clothes Manual work to the thoracic lumbar paraspinals to elongate the tissue and work on diaphragmatic breathing Neuromuscular re-education: Down training: Diaphragmatic breathing in supine, childs pose, and in sitting to work on relaxing the pelvic floor with tactile cues to open the back up  Exercises: Stretches/mobility: Piriformis stretch holding 30 sec bil.  Happy baby in sitting holding 30 sec Posterior hip stretch in supine hold 30 sec bil. In supine                                                                                                                              DATE: 05/25/23  EVAL Examination completed, findings reviewed, pt educated on POC, HEP, and female pelvic floor anatomy, reasoning with pelvic floor assessment internally with pt consent, and vaginal moisturizers. Pt motivated to participate in PT and agreeable to attempt recommendations.     PATIENT EDUCATION:  05/29/23 Education details: vaginal  moisturizers, how they work and how they improve the health of the tissue, Access Code: [email or web address] Person educated: Patient Education method: Explanation, Demonstration, Tactile cues, Verbal cues, and Handouts Education comprehension: verbalized understanding, returned demonstration, verbal cues required, tactile cues required, and needs further education  HOME EXERCISE PROGRAM: 05/29/23 Access Code: [email or web address] URL: [email or web address] Date: 05/29/2023 Prepared by: Marsha Skeen  Program Notes sit on ball and massage the pelvic floor daily for several minutes.   Exercises - Seated Diaphragmatic Breathing  - 1 x daily - 7 x weekly - 3 sets - 10 reps -  Seated Piriformis Stretch with Trunk Bend  - 1 x daily - 7 x weekly - 1 sets - 2 reps - 30 sec hold - Seated Happy Baby With Trunk Flexion For Pelvic Relaxation  - 1 x  daily - 7 x weekly - 1 sets - 1 reps - 30 sec hold - Supine Piriformis Stretch with Leg Straight  - 1 x daily - 7 x weekly - 1 sets - 2 reps - 30 sec hold  ASSESSMENT:  CLINICAL IMPRESSION: Patient is a 73 y.o. female who was seen today for physical therapy  treatment for pelvis floor dysfunction.   Patient is having burning with the vaginal estrogen ream and will contact her doctor. Her introitus is tight and limited mobility. Patient was able to relax and do pelvic floor drop with diaphragmatic breathing. She did not have pain with the therapist performing manual work to the introitus so she now has a negative q-tip test.  Patient will benefit from skilled therapy to improve pelvic floor tissue health, elongation of the vaginal canal and working on coordination to reduce urgency.   OBJECTIVE IMPAIRMENTS: decreased coordination, decreased ROM, decreased strength, increased fascial restrictions, increased muscle spasms, impaired tone, and pain.   ACTIVITY LIMITATIONS: continence, toileting, and locomotion level  PARTICIPATION LIMITATIONS: interpersonal relationship, driving, shopping, and community activity  PERSONAL FACTORS: Fitness, Time since onset of injury/illness/exacerbation, and 1-2 comorbidities: Hypothyroid; Hypertension; Cesarean section are also affecting patient's functional outcome.   REHAB POTENTIAL: Good  CLINICAL DECISION MAKING: Evolving/moderate complexity  EVALUATION COMPLEXITY: Moderate   GOALS: Goals reviewed with patient? Yes  SHORT TERM GOALS: Target date: 06/22/23  Patient educated on vaginal moisturizers and understands how they are used and why to improve the health of the vaginal tissue.  Baseline: Goal status: Met 05/29/23  2.  Patient is able to perform diaphragmatic breathing to elongate her pelvic floor.  Baseline:  Goal status: met 07/24/23  3.  Patient understands was to perform perineal massage externally to start to relax the pelvic floor tissue.   Baseline:  Goal status: INITIAL  4.  Patient is independent with flexibility exercises to indirectly elongate the pelvic floor.  Baseline:  Goal status: Met 07/24/23  5.  Patient educated on vaginal dilators to assist with the expansion of the pelvic floor.  Baseline:  Goal status: INITIAL   LONG TERM GOALS: Target date: 08/17/23  Patient independent with advanced HEP to increase strength of core and pelvic floor.  Baseline:  Goal status: INITIAL  2.  Patient is able to use the dilator that is appropriate for her goal to have vaginal penetration with pain level </= 0-2/10.  Baseline:  Goal status: INITIAL  3.  Patient pelvic floor strength increased to reduce urinary leakage with urgency.  Baseline:  Goal status: INITIAL  4.  Patient is able to use the behavioral technique to reduce the urge to void so she is able to slowly walk to the bathroom without leaking urine and enter her house.  Baseline:  Goal status: INITIAL  5.  Patient reports she is able to urinate 1-2 times at night due to using the urge to void technique.  Baseline:  Goal status: INITIAL   PLAN:  PT FREQUENCY: 1-2x/week  PT DURATION: 12 weeks  PLANNED INTERVENTIONS: 97110-Therapeutic exercises, 97530- Therapeutic activity, [id number]- Neuromuscular re-education, 97535- Self Care, [address]- Manual therapy, G0283- Electrical stimulation (unattended), 97035- Ultrasound, Patient/Family education, Dry Needling, Joint mobilization, Spinal mobilization, Scar mobilization, Cryotherapy, Moist heat, and Biofeedback  PLAN FOR  NEXT SESSION:  internal work to pelvic floor   Marsha Skeen, PT 07/24/23 11:47 AM

## 2023-07-30 ENCOUNTER — Encounter: Payer: Self-pay | Admitting: Family Medicine

## 2023-07-30 DIAGNOSIS — N952 Postmenopausal atrophic vaginitis: Secondary | ICD-10-CM

## 2023-07-31 ENCOUNTER — Encounter: Payer: Self-pay | Admitting: Physical Therapy

## 2023-07-31 ENCOUNTER — Ambulatory Visit: Payer: Self-pay | Admitting: Physical Therapy

## 2023-07-31 DIAGNOSIS — R102 Pelvic and perineal pain: Secondary | ICD-10-CM | POA: Diagnosis not present

## 2023-07-31 DIAGNOSIS — R278 Other lack of coordination: Secondary | ICD-10-CM

## 2023-07-31 DIAGNOSIS — R252 Cramp and spasm: Secondary | ICD-10-CM

## 2023-07-31 NOTE — Patient Instructions (Addendum)
 Bladder Irritants  Certain foods and beverages can be irritating to the bladder.  Avoiding these irritants may decrease your symptoms of urinary urgency, frequency or bladder pain.  Even reducing your intake can help with your symptoms.  Not everyone is sensitive to all bladder irritants, so you may consider focusing on one irritant at a time, removing or reducing your intake of that irritant for 7-10 days to see if this change helps your symptoms.  Water intake is also very important.  Below is a list of bladder irritants.  Drinks: alcohol, carbonated beverages, caffeinated beverages such as coffee and tea, drinks with artificial sweeteners, citrus juices, apple juice, tomato juice  Foods: tomatoes and tomato based foods, spicy food, sugar and artificial sweeteners, vinegar, chocolate, raw onion, apples, citrus fruits, pineapple, cranberries, tomatoes, strawberries, plums, peaches, cantaloupe  Other: acidic urine (too concentrated) - see water intake info below  Substitutes you can try that are NOT irritating to the bladder: cooked onion, pears, papayas, sun-brewed decaf teas, watermelons, non-citrus herbal teas, apricots, kava and low-acid instant drinks (Postum).    WATER INTAKE: Remember to drink lots of water (aim for fluid intake of half your body weight with 2/3 of fluids being water).  You may be limiting fluids due to fear of leakage, but this can actually worsen urgency symptoms due to highly concentrated urine.  Water helps balance the pH of your urine so it doesn't become too acidic - acidic urine is a bladder irritant!  [facility] [address], [address] [place], [place] [address] Phone # [phone] Fax [phone]   Vaginal trainers  Prior to Use:   Wash the vaginal trainer with soap and water before and after each use.   Use a water-soluble lubricant like Slippery Stuff or Surgulibe.   Avoid using Vaseline, coconut oil, or other oil-based  lubricants. They are not water-soluble and can be irritating to the tissues in the vagina.   Do not use silicon-based lubricants with a silicon vaginal trainer. Using a siliconbased lubricant with a silicon device can contribute to break down of the material.  Setting Up Your Space   Work in a comfortable room lying on your back on a bed or couch with your knees bent and knees relaxed open. Use pillows underneath your knees as they are relaxed open and to support your upper back and head. Place a towel underneath your bottom to collect any lubricant.   Place your vaginal trainers and lubricant on a towel next to you within arm's reach for easy access.   Starting to use your trainer:  o Take 10-20 deep breaths to quiet your nervous system  o Perform stretches to help relax your hips and pelvic floor such as child's pose, cat/cow, or happy baby pose  Using Your Trainers   Coat the smallest vaginal trainer, or the size you are most comfortable using, with lubricant   Place the tip of the trainer at the opening to the vagina.   Take a few deep breaths to adjust to the sensation of the lubricant and trainer.   Slowly insert the rounded end of the trainer into your vaginal opening as far as you are comfortable. Pause and breathe if you experience pain, tension, or muscle guarding at any time. Once you feel comfortable gently slide trainer deeper into the vaginal canal as far as it will go without causing pain or discomfort.  Progressing with your Trainers   Gently press the trainer toward the bottom and  sides of the vaginal opening to give it a gentle stretch. Pause and breathe at each spot and tension melts away.   Once fully inserted, turn the trainer clockwise and counterclockwise to produce different sensations   Slowly move the trainer in and out as you breathe and focus on staying as relaxed as possible  To progress to the next size gradually, once one trainer is completely pain-free and  comfortable to use, insert that smaller trainer first for 5-10 minutes and then follow with the next largest size trainer for 5-10 minutes. Gradually decrease the length of time using the smaller trainer as you increase the length of time using the larger trainer.   Move at your pace ad what's comfortable for you.  Wrapping up your session   Use trainers for 5-10 minutes every other day or 3 times a week   Wash and dry your vaginal trainers after use  Other considerations: Try to approach using vaginal trainers from a place of curiosity instead of judgment - what can my body do today, vs. why can't it do this, or I should be able to do this. Try letting go of that idea that it should be different, and try to meet yourself where you are at, without the pressure to change anything Do you bring your vaginal trainers into PT? Sometimes, bringing your trainers into sessions with your PT and having them talk you through the process while you are in control of the trainer can be helpful. Maybe they can help you find ways to make insertion a bit easier for you, or they can help remind you to breathe. If you aren't doing this, I definitely recommend talking to a PT about it. Sometimes knowing the physical tools you have can help with the mental game. One thing that can be helpful to do before jumping to dilating is called "cupping". It is just taking your hand and holding your palm to your vulva and breathing. Doing this before doing any type of trainer training can be helpful as it lets you take a second and check in, vs jumping right in. Kind of like a warm up to your workout! Try different tools and see if you like another one more. Some of our patients prefer crystal wands or plastic trainers to silicone, some prefer starting with a vibrating pelvic wand instead of a trainer, look at different options and see what interests you. You can also try different lubricants. And don't feel as though you need to jump  right into inserting anything. The first few times (or minutes of the session) may just be about putting it at the entrance without inserting, and that's ok! We have also had patients find success with an external vibrator on their pubic bone while they use trainers as this can help distract nerves and increase muscle relaxation. This can be helpful to normalize the trainers. Leave the one you are currently using and the one you want to progress to somewhere you see it every day, like the bathroom counter or the bedside table. Seeing them every day can make them less intimidating. The more you do something, the more routine it becomes, so setting a vaginal trainers schedule and sticking to it can help make it less intimidating. Last thing that could be helpful to you is to set yourself up a relaxing environment when you use your vaginal trainers. Play your favorite calming music, light your favorite candle, incense, or turn on your diffuser, wear your coziest  t-shirt and socks, prop your legs on pillows, anything that feels like a big exhale. Don't distract yourself with tv or a movie. Stay tuned into your body to help maintain relaxation Listening to relaxing music or meditations can also be helpful. Preferences for guided meditations can be so different from person to person so find one you feel relaxed/safe with!   Pelvic Floor Vaginal dilators                                                             Amielle Restore Vaginal Dilator Kit                                                        Vulva Tech                                                                              Restore                                                                                                                   Soul Source                                                                                    Intimate [facility] Set  V Well  dilator set                                                                                           Milli Dilator that you pump                                                                               Berman dilator                                                                                             [place] dilators                                                                  Vaginismus Vaginal dilators                                                    Oh Nut for deep vaginal penetration limitation  Most of these dilators you can get on [employer]. The ones you are not able to do then look at the company website or [email or web address].hunt.info/

## 2023-07-31 NOTE — Therapy (Signed)
 OUTPATIENT PHYSICAL THERAPY FEMALE PELVIC TREATMENT   Patient Name: Diana Wolfe MRN: [id number] DOB:1951-01-17, 73 y.o., female Today's Date: 07/31/2023  END OF SESSION:  PT End of Session - 07/31/23 1532     Visit Number 4    Date for PT Re-Evaluation 08/17/23    Authorization Type Medicare/ [employer]    Authorization - Visit Number 4    Authorization - Number of Visits 10    PT Start Time 1530    PT Stop Time 1610    PT Time Calculation (min) 40 min    Activity Tolerance Patient tolerated treatment well    Behavior During Therapy WFL for tasks assessed/performed             Past Medical History:  Diagnosis Date   ADD (attention deficit disorder)    Anxiety    Depression    GERD (gastroesophageal reflux disease)    Hyperlipidemia    Hypertension    Hypothyroid    PONV (postoperative nausea and vomiting)    Vitamin D  deficiency    Past Surgical History:  Procedure Laterality Date   BREAST BIOPSY Left 07/09/2019   CESAREAN SECTION     ESOPHAGOGASTRODUODENOSCOPY N/A 09/02/2015   Procedure: ESOPHAGOGASTRODUODENOSCOPY (EGD);  Surgeon: Jolinda Necessary, MD;  Location: [facility] ENDOSCOPY;  Service: Endoscopy;  Laterality: N/A;   ESOPHAGOGASTRODUODENOSCOPY N/A 06/05/2022   Procedure: ESOPHAGOGASTRODUODENOSCOPY (EGD);  Surgeon: Evangeline Hilts, MD;  Location: [facility] ENDOSCOPY;  Service: Gastroenterology;  Laterality: N/A;   FOOT SURGERY Right    FOREIGN BODY REMOVAL N/A 06/05/2022   Procedure: FOREIGN BODY REMOVAL;  Surgeon: Evangeline Hilts, MD;  Location: [facility] ENDOSCOPY;  Service: Gastroenterology;  Laterality: N/A;   IM NAILING TIBIA Left 01/09/2001   Dr. 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   Dysphagia 04/21/2020   Generalized abdominal pain 04/21/2020    Hematochezia 04/21/2020   Left lower quadrant pain 04/21/2020   History of colonic polyps 04/21/2020   Urticaria 04/21/2020   B12 deficiency 01/31/2019   Medication management 06/23/2014   Vaginal atrophy 03/27/2014   Hyperlipidemia    Hypertension    Anxiety    GERD (gastroesophageal reflux disease)    ADD (attention deficit disorder)    Depression    Vitamin D  deficiency, unspecified    Hypothyroidism 04/25/2008   Iron deficiency anemia 04/25/2008   Obstructive sleep apnea 11/16/2007   Allergic rhinitis 11/16/2007   Headache 11/16/2007   PULMONARY EMBOLISM, HX OF 11/16/2007    PCP: Wellington Half, FNP  REFERRING PROVIDER: Wellington Half, FNP   REFERRING DIAG:  [phone] (ICD-10-CM) - Medication management  M62.89 (ICD-10-CM) - Pelvic floor dysfunction in female    THERAPY DIAG:  Cramp and spasm  Other lack of coordination  Pelvic pain  Rationale for Evaluation and Treatment: Rehabilitation  ONSET DATE: 2021  SUBJECTIVE:  SUBJECTIVE STATEMENT: I was having the itching reaction to the vulva cream. I use it 3 days per week. I have contacted my doctor about the cream bu have not heard back from them. Leak urine when has the urge to go to the bathroom leaked 1 time in the past 2 weeks. I was able to wait 4.5 hours to urinate for the first time.  Fluid intake: water; tea  PAIN:  Are you having pain? Yes NPRS scale: 7/10 Pain location: Vaginal  Pain type: aching Pain description: intermittent   Aggravating factors: during vaginal penetration Relieving factors: no vaginal penetration  PRECAUTIONS: None  RED FLAGS: None   WEIGHT BEARING RESTRICTIONS: No  FALLS:  Has patient fallen in last 6 months? Yes. Number of falls 1 time slipped on ice.   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  URINATION: Pain with urination: No Fully empty bladder: Yes: sometimes will empty her bladder and 10 minutes have to urinate again Stream: Strong Urgency: Yes , getting out of car, sitting in house Frequency: night time 2-3 times: daytime: 6 times 07/31/23: wake up 2 times at night Leakage: Urge to void and Walking to the bathroom Pads: No  INTERCOURSE:not sexually active at this time but in the future would like to be able to.    PREGNANCY: C-section deliveries 1  PROLAPSE: None   OBJECTIVE:  Note: Objective measures were completed at Evaluation unless otherwise noted.  DIAGNOSTIC FINDINGS:  none  PATIENT SURVEYS:  UIQ-7: 48 PFIQ-7: 44  COGNITION: Overall cognitive status: Within functional limits for tasks assessed     SENSATION: Light touch: Appears intact   POSTURE: decreased lumbar lordosis   LUMBARAROM/PROM:  A/PROM A/PROM  eval  Extension Decreased by 50%   (Blank rows = not tested)  LOWER EXTREMITY ROM: bilateral hip ROM is full   LOWER EXTREMITY MMT:  MMT Right eval Left eval  Hip extension 4/5 4/5  Hip abduction 4/5 5/5   (Blank rows = not tested) PALPATION:    Pelvic Alignment: ASIS  Abdominal: decreased mobility of the tissue below the scar, decreased movement of the diaphragm, difficulty with opening her lower rib cage                External Perineal Exam: dry, redness along the vulva, white patch of skin along the inner left labia majora; Q-tip test was positive at 6, 11 and 1 O'Clock.                              Internal Pelvic Floor: pain with Q-tip therefore did not assess any further, when she bulged therapist did not see  any prolapse, the vaginal canal was small  Patient confirms identification and approves PT to assess internal pelvic floor and treatment Yes  PELVIC MMT:not tested due to the pain   MMT 07/31/23  Vaginal 2/5  Internal Anal Sphincter   External Anal Sphincter   Puborectalis   Diastasis Recti   (Blank rows = not tested)         TODAY'S TREATMENT:   07/31/23 Manual: Internal pelvic floor techniques: No emotional/communication barriers or cognitive limitation. Patient is motivated to learn. Patient understands and  agrees with treatment goals and plan. PT explains patient will be examined in standing, sitting, and lying down to see how their muscles and joints work. When they are ready, they will be asked to remove their underwear so PT can examine their perineum. The patient is also given the option of providing their own chaperone as one is not provided in our facility. The patient also has the right and is explained the right to defer or refuse any part of the evaluation or treatment including the internal exam. With the patient's consent, PT will use one gloved finger to gently assess the muscles of the pelvic floor, seeing how well it contracts and relaxes and if there is muscle symmetry. After, the patient will get dressed and PT and patient will discuss exam findings and plan of care. PT and patient discuss plan of care, schedule, attendance policy and HEP activities.  Therapist gloved finger in the vaginal canal working on the introitus to expand and then using 2 fingers going slowly for the tissue to relax and expand Neuromuscular re-education: Down training: Diaphragmatic breathing with therapist gloved finger in the vaginal canal feeling it expand to reduce the tightness.  Self-care: Educated patient on bladder irritants and how it affects the bladder and cause urgency Educated patient on different vaginal trainers, what they are used for and how to use them.       07/24/23 Manual: Myofascial release: Fascial release  around the urogenital diaphragm and perineal body Internal pelvic floor techniques: No emotional/communication barriers or cognitive limitation. Patient is motivated to learn. Patient understands and agrees with treatment goals and plan. PT explains patient will be examined in standing, sitting, and lying down to see how their muscles and joints work. When they are ready, they will be asked to remove their underwear so PT can examine their perineum. The patient is also given the option of providing their own chaperone as one is not provided in our facility. The patient also has the right and is explained the right to defer or refuse any part of the evaluation or treatment including the internal exam. With the patient's consent, PT will use one gloved finger to gently assess the muscles of the pelvic floor, seeing how well it contracts and relaxes and if there is muscle symmetry. After, the patient will get dressed and PT and patient will discuss exam findings and plan of care. PT and patient discuss plan of care, schedule, attendance policy and HEP activities.  Using a gloved finger going into the vaginal canal working on the introitus with one finger then used the other finger to elongate the tissue Neuromuscular re-education: Down training: Diaphragmatic breathing with therapist feeling the pelvic floor drop for 5 minutes Self-care: Discussed with patient vaginal moisturizers she is using, contact the doctor about the vaginal burning from the internal moisturizer, reviewed how to use it Verbally educated patient on how to perform manual work to the introitus to elongate the tissue and do every other day.     05/29/23 Manual: Soft tissue mobilization: Manual work to the diaphragm to work on diaphragmatic breathing Manual work to the hip adductors then the first layer of the pelvic floor through her clothes Manual work to the thoracic lumbar  paraspinals to elongate the tissue and work on diaphragmatic breathing Neuromuscular re-education: Down training: Diaphragmatic breathing in supine, childs pose, and in sitting to work on relaxing the pelvic floor with tactile cues to open the back up  Exercises: Stretches/mobility: Piriformis stretch holding  30 sec bil.  Happy baby in sitting holding 30 sec Posterior hip stretch in supine hold 30 sec bil. In supine                                                                                                                              DATE: 05/25/23  EVAL Examination completed, findings reviewed, pt educated on POC, HEP, and female pelvic floor anatomy, reasoning with pelvic floor assessment internally with pt consent, and vaginal moisturizers. Pt motivated to participate in PT and agreeable to attempt recommendations.     PATIENT EDUCATION:  05/29/23 Education details: vaginal  moisturizers, how they work and how they improve the health of the tissue, Access Code: [email or web address] Person educated: Patient Education method: Explanation, Demonstration, Tactile cues, Verbal cues, and Handouts Education comprehension: verbalized understanding, returned demonstration, verbal cues required, tactile cues required, and needs further education  HOME EXERCISE PROGRAM: 05/29/23 Access Code: [email or web address] URL: [email or web address] Date: 05/29/2023 Prepared by: Marsha Skeen  Program Notes sit on ball and massage the pelvic floor daily for several minutes.   Exercises - Seated Diaphragmatic Breathing  - 1 x daily - 7 x weekly - 3 sets - 10 reps - Seated Piriformis Stretch with Trunk Bend  - 1 x daily - 7 x weekly - 1 sets - 2 reps - 30 sec hold - Seated Happy Baby With Trunk Flexion For Pelvic Relaxation  - 1 x daily - 7 x weekly - 1 sets - 1 reps - 30 sec hold - Supine Piriformis Stretch with Leg Straight  - 1 x daily - 7 x weekly - 1 sets - 2 reps - 30 sec hold  ASSESSMENT:  CLINICAL  IMPRESSION: Patient is a 73 y.o. female who was seen today for physical therapy  treatment for pelvis floor dysfunction.   Patient is doing the manual work to the perineum when in the shower.  Patient was educated on vaginal trainers and showed her different ones. She would benefit from them. Patient was able to tolerate the therapist using 2 fingers in the vaginal canal due to the tissue being able to relax. Her urgency has decreased some. Patient will benefit from skilled therapy to improve pelvic floor tissue health, elongation of the vaginal canal and working on coordination to reduce urgency.   OBJECTIVE IMPAIRMENTS: decreased coordination, decreased ROM, decreased strength, increased fascial restrictions, increased muscle spasms, impaired tone, and pain.   ACTIVITY LIMITATIONS: continence, toileting, and locomotion level  PARTICIPATION LIMITATIONS: interpersonal relationship, driving, shopping, and community activity  PERSONAL FACTORS: Fitness, Time since onset of injury/illness/exacerbation, and 1-2 comorbidities: Hypothyroid; Hypertension; Cesarean section are also affecting patient's functional outcome.   REHAB POTENTIAL: Good  CLINICAL DECISION MAKING: Evolving/moderate complexity  EVALUATION COMPLEXITY: Moderate   GOALS: Goals reviewed with patient? Yes  SHORT TERM GOALS: Target date: 06/22/23  Patient educated on vaginal moisturizers and understands how they are used and why to improve the health of the vaginal tissue.  Baseline: Goal status: Met 05/29/23  2.  Patient is able to perform diaphragmatic breathing to elongate her pelvic floor.  Baseline:  Goal status: met 07/24/23  3.  Patient understands was to perform perineal massage externally to start to relax the pelvic floor tissue.  Baseline:  Goal status: Met 07/31/23  4.  Patient is independent with flexibility exercises to indirectly elongate the pelvic floor.  Baseline:  Goal status: Met 07/24/23  5.  Patient educated  on vaginal dilators to assist with the expansion of the pelvic floor.  Baseline:  Goal status: INITIAL   LONG TERM GOALS: Target date: 08/17/23  Patient independent with advanced HEP to increase strength of core and pelvic floor.  Baseline:  Goal status: INITIAL  2.  Patient is able to use the dilator that is appropriate for her goal to have vaginal penetration with pain level </= 0-2/10.  Baseline:  Goal status: INITIAL  3.  Patient pelvic floor strength increased to reduce urinary leakage with urgency.  Baseline:  Goal status: INITIAL  4.  Patient is able to use the behavioral technique to reduce the urge to void so she is able to slowly walk to the bathroom without leaking urine and enter her house.  Baseline:  Goal status: INITIAL  5.  Patient reports she is able to urinate 1-2 times at night due to using the urge to void technique.  Baseline:  Goal status: INITIAL   PLAN:  PT FREQUENCY: 1-2x/week  PT DURATION: 12 weeks  PLANNED INTERVENTIONS: 97110-Therapeutic exercises, 97530- Therapeutic activity, 97112- Neuromuscular re-education, 97535- Self Care, [id number]- Manual therapy, [id number]- Electrical stimulation (unattended), 97035- Ultrasound, Patient/Family education, Dry Needling, Joint mobilization, Spinal mobilization, Scar mobilization, Cryotherapy, Moist heat, and Biofeedback  PLAN FOR NEXT SESSION:  internal work to pelvic floor; vaginal trainers   Marsha Skeen, PT 07/31/23 4:19 PM

## 2023-08-01 MED ORDER — ESTRADIOL 0.1 MG/GM VA CREA: 1.0000 | TOPICAL_CREAM | Freq: Every day | VAGINAL | 12 refills | Status: AC

## 2023-08-05 ENCOUNTER — Other Ambulatory Visit: Payer: Self-pay | Admitting: Family Medicine

## 2023-08-07 ENCOUNTER — Encounter: Payer: Self-pay | Admitting: Physical Therapy

## 2023-08-07 ENCOUNTER — Ambulatory Visit: Payer: Self-pay | Admitting: Physical Therapy

## 2023-08-07 DIAGNOSIS — R252 Cramp and spasm: Secondary | ICD-10-CM | POA: Diagnosis not present

## 2023-08-07 DIAGNOSIS — R102 Pelvic and perineal pain: Secondary | ICD-10-CM | POA: Diagnosis not present

## 2023-08-07 DIAGNOSIS — R278 Other lack of coordination: Secondary | ICD-10-CM

## 2023-08-07 NOTE — Therapy (Signed)
 OUTPATIENT PHYSICAL THERAPY FEMALE PELVIC TREATMENT   Patient Name: Diana Wolfe MRN: [id number] DOB:05-20-1950, 73 y.o., female Today's Date: 08/07/2023  END OF SESSION:  PT End of Session - 08/07/23 0932     Visit Number 5    Date for PT Re-Evaluation 08/17/23    Authorization Type Medicare/ [employer]    Authorization - Visit Number 5    Authorization - Number of Visits 10    PT Start Time 0930    PT Stop Time 1010    PT Time Calculation (min) 40 min    Activity Tolerance Patient tolerated treatment well    Behavior During Therapy WFL for tasks assessed/performed          Past Medical History:  Diagnosis Date   ADD (attention deficit disorder)    Anxiety    Depression    GERD (gastroesophageal reflux disease)    Hyperlipidemia    Hypertension    Hypothyroid    PONV (postoperative nausea and vomiting)    Vitamin D  deficiency    Past Surgical History:  Procedure Laterality Date   BREAST BIOPSY Left 07/09/2019   CESAREAN SECTION     ESOPHAGOGASTRODUODENOSCOPY N/A 09/02/2015   Procedure: ESOPHAGOGASTRODUODENOSCOPY (EGD);  Surgeon: Jolinda Necessary, MD;  Location: [facility];  Service: Endoscopy;  Laterality: N/A;   ESOPHAGOGASTRODUODENOSCOPY N/A 06/05/2022   Procedure: ESOPHAGOGASTRODUODENOSCOPY (EGD);  Surgeon: Evangeline Hilts, MD;  Location: [facility];  Service: Gastroenterology;  Laterality: N/A;   FOOT SURGERY Right    FOREIGN BODY REMOVAL N/A 06/05/2022   Procedure: FOREIGN BODY REMOVAL;  Surgeon: Evangeline Hilts, MD;  Location: [facility]OSCOPY;  Service: Gastroenterology;  Laterality: N/A;   IM NAILING TIBIA Left 01/09/2001   Dr. 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   Dysphagia 04/21/2020   Generalized abdominal pain 04/21/2020    Hematochezia 04/21/2020   Left lower quadrant pain 04/21/2020   History of colonic polyps 04/21/2020   Urticaria 04/21/2020   B12 deficiency 01/31/2019   Medication management 06/23/2014   Vaginal atrophy 03/27/2014   Hyperlipidemia    Hypertension    Anxiety    GERD (gastroesophageal reflux disease)    ADD (attention deficit disorder)    Depression    Vitamin D  deficiency, unspecified    Hypothyroidism 04/25/2008   Iron deficiency anemia 04/25/2008   Obstructive sleep apnea 11/16/2007   Allergic rhinitis 11/16/2007   Headache 11/16/2007   PULMONARY EMBOLISM, HX OF 11/16/2007    PCP: Wellington Half, FNP  REFERRING PROVIDER: Wellington Half, FNP   REFERRING DIAG:  [phone] (ICD-10-CM) - Medication management  M62.89 (ICD-10-CM) - Pelvic floor dysfunction in female    THERAPY DIAG:  Cramp and spasm  Other lack of coordination  Pelvic pain  Rationale for Evaluation and Treatment: Rehabilitation  ONSET DATE: 2021  SUBJECTIVE:  SUBJECTIVE STATEMENT: I have ordered the pelvic floor trainer to increase strength. I have the estrogen cream.   Fluid intake: water; tea  PAIN:  Are you having pain? Yes NPRS scale: 7/10 Pain location: Vaginal  Pain type: aching Pain description: intermittent   Aggravating factors: during vaginal penetration Relieving factors: no vaginal penetration  PRECAUTIONS: None  RED FLAGS: None   WEIGHT BEARING RESTRICTIONS: No  FALLS:  Has patient fallen in last 6 months? Yes. Number of falls 1 time slipped on ice.   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  URINATION: Pain with urination: No Fully empty bladder: Yes: sometimes will empty her bladder and 10 minutes have to urinate again Stream: Strong Urgency: Yes , getting out of car, sitting in house 08/07/23: feels the urgency after driving more than 1 hour; no urgency with sitting in house.  Frequency: night time 2-3 times: daytime: 6 times 07/31/23: wake up 2 times at night 08/07/23: wake up 1 time to urinate.  Leakage: Urge to void and Walking to the bathroom 08/07/23: urinary leakage 1 time since last week when she got out of the car and rushed to the bathroom Pads: No  INTERCOURSE:not sexually active at this time but in the future would like to be able to.    PREGNANCY: C-section deliveries 1  PROLAPSE: None   OBJECTIVE:  Note: Objective measures were completed at Evaluation unless otherwise noted.  DIAGNOSTIC FINDINGS:  none  PATIENT SURVEYS:  UIQ-7: 48 PFIQ-7: 44  COGNITION: Overall cognitive status: Within functional limits for tasks assessed     SENSATION: Light touch: Appears intact   POSTURE: decreased lumbar lordosis   LUMBARAROM/PROM:  A/PROM A/PROM  eval  Extension Decreased by 50%   (Blank rows = not tested)  LOWER EXTREMITY ROM: bilateral hip ROM is full   LOWER EXTREMITY MMT:  MMT Right eval Left eval  Hip extension 4/5 4/5  Hip abduction 4/5 5/5   (Blank rows = not tested) PALPATION:    Pelvic Alignment: ASIS  Abdominal: decreased mobility of the tissue below the scar, decreased movement of the diaphragm, difficulty with opening her lower rib cage                External Perineal Exam: dry, redness along the vulva, white patch of skin along the inner left labia majora; Q-tip test was positive at 6, 11 and 1 O'Clock.                              Internal Pelvic Floor: pain with Q-tip therefore did not assess any further, when she  bulged therapist did not see any prolapse, the vaginal canal was small  Patient confirms identification and approves PT to assess internal pelvic floor and treatment Yes  PELVIC MMT:not tested due to the pain   MMT 07/31/23  Vaginal 2/5  Internal Anal Sphincter   External Anal Sphincter   Puborectalis   Diastasis Recti   (Blank rows = not tested)         TODAY'S TREATMENT:   08/07/23 Manual: Scar tissue mobilization: Scar work to the lower abdomen to improve mobility and educated patient on how to perform at home  Myofascial release: Fascial release around the lower abdomen, umbilicus, indirectly around the ureter, and pubovesical ligament Neuromuscular re-education: Core facilitation: Transverse abdominus contraction with ball squeeze and tactile cues to contract the upper and lower abdomen Supine hip flexion isometric to engage the lower abdominal area correctly Quadruped lift opposite arm and leg with abdominal contraction     07/31/23 Manual: Internal pelvic floor techniques: No emotional/communication barriers or cognitive limitation. Patient is motivated to learn. Patient understands and agrees with treatment goals and plan. PT explains patient will be examined in standing, sitting, and lying down to see how their muscles and joints work. When they are ready, they will be asked to remove their underwear so PT can examine their perineum. The patient is also given the option of providing their own chaperone as one is not provided in our facility. The patient also has the right and is explained the right to defer or refuse any part of the evaluation or treatment including the internal exam. With the patient's consent, PT will use one gloved finger to gently assess the muscles of the pelvic floor, seeing how well it contracts and relaxes and if there is muscle symmetry. After, the patient will get dressed and PT and patient will discuss exam findings and plan of care. PT and patient  discuss plan of care, schedule, attendance policy and HEP activities.  Therapist gloved finger in the vaginal canal working on the introitus to expand and then using 2 fingers going slowly for the tissue to relax and expand Neuromuscular re-education: Down training: Diaphragmatic breathing with therapist gloved finger in the vaginal canal feeling it expand to reduce the tightness.  Self-care: Educated patient on bladder irritants and how it affects the bladder and cause urgency Educated patient on different vaginal trainers, what they are used for and how to use them.      07/24/23 Manual: Myofascial release: Fascial release  around the urogenital diaphragm and perineal body Internal pelvic floor techniques: No emotional/communication barriers or cognitive limitation. Patient is motivated to learn. Patient understands and agrees with treatment goals and plan. PT explains patient will be examined in standing, sitting, and lying down to see how their muscles and joints work. When they are ready, they will be asked to remove their underwear so PT can examine their perineum. The patient is also given the option of providing their own chaperone as one is not provided in our facility. The patient also has the right and is explained the right to defer or refuse any part of the evaluation or treatment including the internal exam. With the patient's consent, PT will use one gloved finger to gently assess the muscles of the pelvic floor, seeing how well it contracts and relaxes and if there is muscle symmetry. After, the patient will get dressed and PT and patient will discuss exam findings and plan of care. PT and patient discuss plan of care, schedule, attendance policy and HEP activities.  Using a gloved finger going into the vaginal canal working on the introitus with one finger then used the other finger to elongate the tissue Neuromuscular re-education: Down training: Diaphragmatic breathing with  therapist feeling the pelvic floor drop for 5 minutes Self-care: Discussed with patient vaginal moisturizers she is using, contact the doctor about the vaginal burning from the internal moisturizer, reviewed how to use it Verbally educated patient on how to perform manual work to the introitus to elongate the tissue and do every other day.  PATIENT EDUCATION:  08/07/23 Education details: Access Code: [email or web address], abdominal scar massage Person educated: Patient Education method: Explanation, Demonstration, Tactile cues, Verbal cues, and Handouts Education comprehension: verbalized understanding, returned demonstration, verbal cues required, tactile cues required, and needs further education  HOME EXERCISE PROGRAM: 08/07/23 Access Code: [email or web address] URL: [email or web address] Date: 08/07/2023 Prepared by: Marsha Skeen  Program Notes sit on ball and massage the pelvic floor daily for several minutes.   Exercises - Seated Diaphragmatic Breathing  - 1 x daily - 7 x weekly - 3 sets - 10 reps - Seated Piriformis Stretch with Trunk Bend  - 1 x daily - 7 x weekly - 1 sets - 2 reps - 30 sec hold - Seated Happy Baby With Trunk Flexion For Pelvic Relaxation  - 1 x daily - 7 x weekly - 1 sets - 1 reps - 30 sec hold - Supine Piriformis Stretch with Leg Straight  - 1 x daily - 7 x weekly - 1 sets - 2 reps - 30 sec hold - Hooklying Transversus Abdominis Palpation  - 1 x daily - 3 x weekly - 1 sets - 10 reps - Hooklying Isometric Hip Flexion  - 1 x daily - 3 x weekly - 1 sets - 10 reps - Quadruped Pelvic Floor Contraction with Opposite Arm and Leg Lift  - 1 x daily - 1 x weekly - 2 sets - 10 reps  ASSESSMENT:  CLINICAL IMPRESSION: Patient is a 73 y.o. female who was seen today for physical therapy  treatment for pelvis floor dysfunction.  Patient has restrictions around her c-sections scar making it difficult to engage the lower abdomen. She had fascial restrictions around the lower  abdomen that was released today. Wakes up 1 time at night to urinate now. Urinary leakage 1 time since last week when she got out of the car and rushed to the bathroom. No urgency with sitting in her house.  Patient will benefit from skilled therapy to improve pelvic floor tissue health, elongation of the vaginal canal and working on coordination to reduce urgency.   OBJECTIVE IMPAIRMENTS: decreased coordination, decreased ROM, decreased strength, increased fascial restrictions, increased muscle spasms, impaired tone, and pain.   ACTIVITY LIMITATIONS: continence, toileting, and locomotion level  PARTICIPATION LIMITATIONS: interpersonal relationship, driving, shopping, and community activity  PERSONAL FACTORS: Fitness, Time since onset of injury/illness/exacerbation, and 1-2 comorbidities: Hypothyroid; Hypertension; Cesarean section are also affecting patient's functional outcome.   REHAB POTENTIAL: Good  CLINICAL DECISION MAKING: Evolving/moderate complexity  EVALUATION COMPLEXITY: Moderate   GOALS: Goals reviewed with patient? Yes  SHORT TERM GOALS: Target date: 06/22/23  Patient educated on vaginal moisturizers and understands how they are used and why to improve the health of the vaginal tissue.  Baseline: Goal status: Met 05/29/23  2.  Patient is able to perform diaphragmatic breathing to elongate her pelvic floor.  Baseline:  Goal status: met 07/24/23  3.  Patient understands was to perform perineal massage externally to start to relax the pelvic floor tissue.  Baseline:  Goal status: Met 07/31/23  4.  Patient is independent with flexibility exercises to indirectly elongate the pelvic floor.  Baseline:  Goal status: Met 07/24/23  5.  Patient educated on vaginal dilators to assist with the expansion of the pelvic floor.  Baseline:  Goal status: INITIAL   LONG TERM GOALS: Target date: 08/17/23  Patient independent with advanced HEP to increase strength of core and pelvic floor.   Baseline:  Goal status: INITIAL  2.  Patient is  able to use the dilator that is appropriate for her goal to have vaginal penetration with pain level </= 0-2/10.  Baseline:  Goal status: INITIAL  3.  Patient pelvic floor strength increased to reduce urinary leakage with urgency.  Baseline:  Goal status: INITIAL  4.  Patient is able to use the behavioral technique to reduce the urge to void so she is able to slowly walk to the bathroom without leaking urine and enter her house.  Baseline:  Goal status: INITIAL  5.  Patient reports she is able to urinate 1-2 times at night due to using the urge to void technique.  Baseline:  Goal status: Met 08/07/23   PLAN:  PT FREQUENCY: 1-2x/week  PT DURATION: 12 weeks  PLANNED INTERVENTIONS: 97110-Therapeutic exercises, 97530- Therapeutic activity, 97112- Neuromuscular re-education, 97535- Self Care, 81191- Manual therapy, G0283- Electrical stimulation (unattended), 97035- Ultrasound, Patient/Family education, Dry Needling, Joint mobilization, Spinal mobilization, Scar mobilization, Cryotherapy, Moist heat, and Biofeedback  PLAN FOR NEXT SESSION:  internal work to pelvic floor; See if she has gotten the Kegel balls, renewal if to continue.    Marsha Skeen, PT 08/07/23 10:12 AM

## 2023-08-09 DIAGNOSIS — H6122 Impacted cerumen, left ear: Secondary | ICD-10-CM | POA: Diagnosis not present

## 2023-08-09 DIAGNOSIS — H40011 Open angle with borderline findings, low risk, right eye: Secondary | ICD-10-CM | POA: Diagnosis not present

## 2023-08-09 DIAGNOSIS — H9192 Unspecified hearing loss, left ear: Secondary | ICD-10-CM | POA: Diagnosis not present

## 2023-08-09 DIAGNOSIS — H40012 Open angle with borderline findings, low risk, left eye: Secondary | ICD-10-CM | POA: Diagnosis not present

## 2023-08-16 ENCOUNTER — Encounter: Payer: Self-pay | Admitting: Physical Therapy

## 2023-08-16 ENCOUNTER — Ambulatory Visit: Admitting: Physical Therapy

## 2023-08-16 DIAGNOSIS — R278 Other lack of coordination: Secondary | ICD-10-CM

## 2023-08-16 DIAGNOSIS — R252 Cramp and spasm: Secondary | ICD-10-CM | POA: Diagnosis not present

## 2023-08-16 DIAGNOSIS — R102 Pelvic and perineal pain: Secondary | ICD-10-CM | POA: Diagnosis not present

## 2023-08-16 NOTE — Patient Instructions (Signed)
Vaginal trainers  Prior to Use:   Wash the vaginal trainer with soap and water before and after each use.   Use a water-soluble lubricant like Slippery Stuff or Surgulibe.   Avoid using Vaseline, coconut oil, or other oil-based lubricants. They are not water-soluble and can be irritating to the tissues in the vagina.   Do not use silicon-based lubricants with a silicon vaginal trainer. Using a siliconbased lubricant with a silicon device can contribute to break down of the material.  Setting Up Your Space   Work in a comfortable room lying on your back on a bed or couch with your knees bent and knees relaxed open. Use pillows underneath your knees as they are relaxed open and to support your upper back and head. Place a towel underneath your bottom to collect any lubricant.   Place your vaginal trainers and lubricant on a towel next to you within arm's reach for easy access.   Starting to use your trainer:  o Take 10-20 deep breaths to quiet your nervous system  o Perform stretches to help relax your hips and pelvic floor such as child's pose, cat/cow, or happy baby pose  Using Your Trainers   Coat the smallest vaginal trainer, or the size you are most comfortable using, with lubricant   Place the tip of the trainer at the opening to the vagina.   Take a few deep breaths to adjust to the sensation of the lubricant and trainer.   Slowly insert the rounded end of the trainer into your vaginal opening as far as you are comfortable. Pause and breathe if you experience pain, tension, or muscle guarding at any time. Once you feel comfortable gently slide trainer deeper into the vaginal canal as far as it will go without causing pain or discomfort.  Progressing with your Trainers   Gently press the trainer toward the bottom and sides of the vaginal opening to give it a gentle stretch. Pause and breathe at each spot and tension melts away.   Once fully inserted, turn the trainer clockwise and  counterclockwise to produce different sensations   Slowly move the trainer in and out as you breathe and focus on staying as relaxed as possible  To progress to the next size gradually, once one trainer is completely pain-free and comfortable to use, insert that smaller trainer first for 5-10 minutes and then follow with the next largest size trainer for 5-10 minutes. Gradually decrease the length of time using the smaller trainer as you increase the length of time using the larger trainer.   Move at your pace ad what's comfortable for you.  Wrapping up your session   Use trainers for 5-10 minutes every other day or 3 times a week   Wash and dry your vaginal trainers after use  Other considerations: Try to approach using vaginal trainers from a place of curiosity instead of judgment - what can my body do today, vs. why can't it do this, or I should be able to do this. Try letting go of that idea that it should be different, and try to meet yourself where you are at, without the pressure to change anything Do you bring your vaginal trainers into PT? Sometimes, bringing your trainers into sessions with your PT and having them talk you through the process while you are in control of the trainer can be helpful. Maybe they can help you find ways to make insertion a bit easier for you, or they can  help remind you to breathe. If you aren't doing this, I definitely recommend talking to a PT about it. Sometimes knowing the physical tools you have can help with the mental game. One thing that can be helpful to do before jumping to dilating is called "cupping". It is just taking your hand and holding your palm to your vulva and breathing. Doing this before doing any type of trainer training can be helpful as it lets you take a second and check in, vs jumping right in. Kind of like a warm up to your workout! Try different tools and see if you like another one more. Some of our patients prefer crystal wands or  plastic trainers to silicone, some prefer starting with a vibrating pelvic wand instead of a trainer, look at different options and see what interests you. You can also try different lubricants. And don't feel as though you need to jump right into inserting anything. The first few times (or minutes of the session) may just be about putting it at the entrance without inserting, and that's ok! We have also had patients find success with an external vibrator on their pubic bone while they use trainers as this can help distract nerves and increase muscle relaxation. This can be helpful to normalize the trainers. Leave the one you are currently using and the one you want to progress to somewhere you see it every day, like the bathroom counter or the bedside table. Seeing them every day can make them less intimidating. The more you do something, the more routine it becomes, so setting a vaginal trainers schedule and sticking to it can help make it less intimidating. Last thing that could be helpful to you is to set yourself up a relaxing environment when you use your vaginal trainers. Play your favorite calming music, light your favorite candle, incense, or turn on your diffuser, wear your coziest t-shirt and socks, prop your legs on pillows, anything that feels like a big exhale. Don't distract yourself with tv or a movie. Stay tuned into your body to help maintain relaxation Listening to relaxing music or meditations can also be helpful. Preferences for guided meditations can be so different from person to person so find one you feel relaxed/safe with!   [facility] [address] [place], [place] [address] Phone # [phone] Fax [phone]

## 2023-08-16 NOTE — Therapy (Signed)
 OUTPATIENT PHYSICAL THERAPY FEMALE PELVIC TREATMENT   Patient Name: Diana Wolfe MRN: [id number] DOB:Aug 14, 1950, 73 y.o., female Today's Date: 08/16/2023  END OF SESSION:  PT End of Session - 08/16/23 1400     Visit Number 6    Date for PT Re-Evaluation 08/17/23    Authorization Type Medicare/ [employer]    Authorization - Visit Number 6    Authorization - Number of Visits 10    PT Start Time 1400    PT Stop Time 1440    PT Time Calculation (min) 40 min    Activity Tolerance Patient tolerated treatment well    Behavior During Therapy WFL for tasks assessed/performed          Past Medical History:  Diagnosis Date   ADD (attention deficit disorder)    Anxiety    Depression    GERD (gastroesophageal reflux disease)    Hyperlipidemia    Hypertension    Hypothyroid    PONV (postoperative nausea and vomiting)    Vitamin D  deficiency    Past Surgical History:  Procedure Laterality Date   BREAST BIOPSY Left 07/09/2019   CESAREAN SECTION     ESOPHAGOGASTRODUODENOSCOPY N/A 09/02/2015   Procedure: ESOPHAGOGASTRODUODENOSCOPY (EGD);  Surgeon: Lynwood Bohr, MD;  Location: [facility] ENDOSCOPY;  Service: Endoscopy;  Laterality: N/A;   ESOPHAGOGASTRODUODENOSCOPY N/A 06/05/2022   Procedure: ESOPHAGOGASTRODUODENOSCOPY (EGD);  Surgeon: Burnette Fallow, MD;  Location: [facility];  Service: Gastroenterology;  Laterality: N/A;   FOOT SURGERY Right    FOREIGN BODY REMOVAL N/A 06/05/2022   Procedure: FOREIGN BODY REMOVAL;  Surgeon: Burnette Fallow, MD;  Location: [facility] ENDOSCOPY;  Service: Gastroenterology;  Laterality: N/A;   IM NAILING TIBIA Left 01/09/2001   Dr. 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   Dysphagia 04/21/2020   Generalized abdominal pain 04/21/2020    Hematochezia 04/21/2020   Left lower quadrant pain 04/21/2020   History of colonic polyps 04/21/2020   Urticaria 04/21/2020   B12 deficiency 01/31/2019   Medication management 06/23/2014   Vaginal atrophy 03/27/2014   Hyperlipidemia    Hypertension    Anxiety    GERD (gastroesophageal reflux disease)    ADD (attention deficit disorder)    Depression    Vitamin D  deficiency, unspecified    Hypothyroidism 04/25/2008   Iron deficiency anemia 04/25/2008   Obstructive sleep apnea 11/16/2007   Allergic rhinitis 11/16/2007   Headache 11/16/2007   PULMONARY EMBOLISM, HX OF 11/16/2007    PCP: Alvia Corean CROME, FNP  REFERRING PROVIDER: Alvia Corean CROME, FNP   REFERRING DIAG:  [id number] (ICD-10-CM) - Medication management  M62.89 (ICD-10-CM) - Pelvic floor dysfunction in female    THERAPY DIAG:  Cramp and spasm - Plan: PT plan of care cert/re-cert  Other lack of coordination - Plan: PT plan of care cert/re-cert  Pelvic pain - Plan: PT plan of care cert/re-cert  Rationale for Evaluation and Treatment: Rehabilitation  ONSET DATE: 2021  SUBJECTIVE:  SUBJECTIVE STATEMENT: I got the dilators. No urinary leakage but still has the urgency.   Fluid intake: water; tea  PAIN:  Are you having pain? Yes NPRS scale: 7/10 Pain location: Vaginal  Pain type: aching Pain description: intermittent   Aggravating factors: during vaginal penetration Relieving factors: no vaginal penetration  PRECAUTIONS: None  RED FLAGS: None   WEIGHT BEARING RESTRICTIONS: No  FALLS:  Has patient fallen in last 6 months? Yes. Number of falls 1 time slipped on ice.   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  URINATION: Pain with urination: No Fully empty bladder: Yes: sometimes will empty her bladder and 10 minutes have to urinate again Stream: Strong Urgency: Yes , getting out of car, sitting in house 08/07/23: feels the urgency after driving more than 1 hour; no urgency with sitting in house.  Frequency: night time 2-3 times: daytime: 6 times 07/31/23: wake up 2 times at night 08/07/23: wake up 1 time to urinate.  Leakage: Urge to void and Walking to the bathroom 08/07/23: urinary leakage 1 time since last week when she got out of the car and rushed to the bathroom Pads: No  INTERCOURSE:not sexually active at this time but in the future would like to be able to.    PREGNANCY: C-section deliveries 1  PROLAPSE: None   OBJECTIVE:  Note: Objective measures were completed at Evaluation unless otherwise noted.  DIAGNOSTIC FINDINGS:  none  PATIENT SURVEYS:  UIQ-7: 48 PFIQ-7: 44 08/16/23: UIQ-7: 33 PFIQ-7: 28  COGNITION: Overall cognitive status: Within functional limits for tasks assessed     SENSATION: Light touch: Appears intact   POSTURE: decreased lumbar lordosis   LUMBARAROM/PROM:  A/PROM A/PROM  eval  Extension Decreased by 50%   (Blank rows = not tested)  LOWER EXTREMITY ROM: bilateral hip ROM is full   LOWER EXTREMITY MMT:  MMT Right eval Left eval  Hip extension 4/5 4/5  Hip abduction 4/5 5/5   (Blank rows = not tested) PALPATION:    Pelvic Alignment: ASIS  Abdominal: decreased mobility of the tissue below the scar, decreased movement of the diaphragm, difficulty with opening her lower rib cage                External Perineal Exam: dry, redness along the vulva, white patch of skin along the inner left labia majora; Q-tip test was positive at 6, 11 and 1 O'Clock.                               Internal Pelvic Floor: pain with Q-tip therefore did not assess any further, when she bulged therapist did not see any prolapse, the vaginal canal was small  Patient confirms identification and approves PT to assess internal pelvic floor and treatment Yes  PELVIC MMT:not tested due to the pain   MMT 07/31/23  Vaginal 2/5  Internal Anal Sphincter   External Anal Sphincter   Puborectalis   Diastasis Recti   (Blank rows = not tested)         TODAY'S TREATMENT:  08/16/23 Exercises: Stretches/mobility: Lunge position to open up the anterior hip and posterior hip  Sitting v stretch with cues to not  flex at the thoracic instead at the hips then reach to one leg holding by 30 sec  Cat cow in standing leaning on counter with tactile cues  Strengthening: Standing with one foot on yoga block and pulling red band across trunk toward the knee on yoga block 15 x each then go the other way to improve SI joint movement Standing on leaning on table with one foot on block and move up and down on block to work the SI joint and lumbar multifidi 15 x each Self-care: Educated patient on how to use the vaginal dilators to elongate the tissue, how to progress, how to clean, and use lubricant     08/07/23 Manual: Scar tissue mobilization: Scar work to the lower abdomen to improve mobility and educated patient on how to perform at home Myofascial release: Fascial release around the lower abdomen, umbilicus, indirectly around the ureter, and pubovesical ligament Neuromuscular re-education: Core facilitation: Transverse abdominus contraction with ball squeeze and tactile cues to contract the upper and lower abdomen Supine hip flexion isometric to engage the lower abdominal area correctly Quadruped lift opposite arm and leg with abdominal contraction     07/31/23 Manual: Internal pelvic floor techniques: No emotional/communication barriers or cognitive limitation. Patient is  motivated to learn. Patient understands and agrees with treatment goals and plan. PT explains patient will be examined in standing, sitting, and lying down to see how their muscles and joints work. When they are ready, they will be asked to remove their underwear so PT can examine their perineum. The patient is also given the option of providing their own chaperone as one is not provided in our facility. The patient also has the right and is explained the right to defer or refuse any part of the evaluation or treatment including the internal exam. With the patient's consent, PT will use one gloved finger to gently assess the muscles of the pelvic floor, seeing how well it contracts and relaxes and if there is muscle symmetry. After, the patient will get dressed and PT and patient will discuss exam findings and plan of care. PT and patient discuss plan of care, schedule, attendance policy and HEP activities.  Therapist gloved finger in the vaginal canal working on the introitus to expand and then using 2 fingers going slowly for the tissue to relax and expand Neuromuscular re-education: Down training: Diaphragmatic breathing with therapist gloved finger in the vaginal canal feeling it expand to reduce the tightness.  Self-care: Educated patient on bladder irritants and how it affects the bladder and cause urgency Educated patient on different vaginal trainers, what they are used for and how to use them.      PATIENT EDUCATION:  08/07/23 Education details: Access Code: [email or web address], abdominal scar massage Person educated: Patient Education method: Explanation, Demonstration, Tactile cues, Verbal cues, and Handouts Education comprehension: verbalized understanding, returned demonstration, verbal cues required, tactile cues required, and needs further education  HOME EXERCISE PROGRAM: 08/07/23 Access Code: [email or web address] URL: [email or web address] Date: 08/07/2023 Prepared by: Channing Pereyra  Program Notes sit on ball and massage the pelvic floor daily for several minutes.   Exercises - Seated Diaphragmatic Breathing  - 1 x daily - 7 x weekly - 3 sets - 10 reps - Seated Piriformis Stretch with Trunk Bend  - 1 x daily - 7 x weekly - 1 sets - 2 reps - 30 sec hold - Seated Happy Baby With Trunk Flexion For Pelvic Relaxation  - 1 x daily - 7 x weekly -  1 sets - 1 reps - 30 sec hold - Supine Piriformis Stretch with Leg Straight  - 1 x daily - 7 x weekly - 1 sets - 2 reps - 30 sec hold - Hooklying Transversus Abdominis Palpation  - 1 x daily - 3 x weekly - 1 sets - 10 reps - Hooklying Isometric Hip Flexion  - 1 x daily - 3 x weekly - 1 sets - 10 reps - Quadruped Pelvic Floor Contraction with Opposite Arm and Leg Lift  - 1 x daily - 1 x weekly - 2 sets - 10 reps  ASSESSMENT:  CLINICAL IMPRESSION: Patient is a 73 y.o. female who was seen today for physical therapy  treatment for pelvis floor dysfunction.  Patient is waking up 1-2 times per night. Patient has not had any urinary leakage. Urinary urgency improved by 40% since initial evaluation.   Pelvic floor strength is 2/5 and difficulty with relaxation. Patient has purchased vaginal dilator to expand the canal and stretch tissue. Patient is working on hip and SI joint mobility to relax the pelvic floor. She had some urgency with exercise today. Patient will benefit from skilled therapy to improve pelvic floor tissue health, elongation of the vaginal canal and working on coordination to reduce urgency.   OBJECTIVE IMPAIRMENTS: decreased coordination, decreased ROM, decreased strength, increased fascial restrictions, increased muscle spasms, impaired tone, and pain.   ACTIVITY LIMITATIONS: continence, toileting, and locomotion level  PARTICIPATION LIMITATIONS: interpersonal relationship, driving, shopping, and community activity  PERSONAL FACTORS: Fitness, Time since onset of injury/illness/exacerbation, and 1-2 comorbidities:  Hypothyroid; Hypertension; Cesarean section are also affecting patient's functional outcome.   REHAB POTENTIAL: Good  CLINICAL DECISION MAKING: Evolving/moderate complexity  EVALUATION COMPLEXITY: Moderate   GOALS: Goals reviewed with patient? Yes  SHORT TERM GOALS: Target date: 06/22/23  Patient educated on vaginal moisturizers and understands how they are used and why to improve the health of the vaginal tissue.  Baseline: Goal status: Met 05/29/23  2.  Patient is able to perform diaphragmatic breathing to elongate her pelvic floor.  Baseline:  Goal status: met 07/24/23  3.  Patient understands was to perform perineal massage externally to start to relax the pelvic floor tissue.  Baseline:  Goal status: Met 07/31/23  4.  Patient is independent with flexibility exercises to indirectly elongate the pelvic floor.  Baseline:  Goal status: Met 07/24/23  5.  Patient educated on vaginal dilators to assist with the expansion of the pelvic floor.  Baseline:  Goal status: met 08/16/23  LONG TERM GOALS: Target date: 11/09/23  Patient independent with advanced HEP to increase strength of core and pelvic floor.  Baseline:  Goal status: ongoing 08/16/23  2.  Patient is able to use the dilator that is appropriate for her goal to have vaginal penetration with pain level </= 0-2/10.  Baseline:  Goal status: ongoing 08/16/23  3.  Patient pelvic floor strength increased to reduce urinary leakage with urgency.  Baseline:  Goal status: ongoing 08/16/23  4.  Patient is able to use the behavioral technique to reduce the urge to void so she is able to slowly walk to the bathroom without leaking urine and enter her house.  Baseline:  Goal status: ongoing 08/16/23  5.  Patient reports she is able to urinate 1-2 times at night due to using the urge to void technique.  Baseline:  Goal status: Met 08/07/23   PLAN:  PT FREQUENCY: 1-2x/week  PT DURATION: 12 weeks  PLANNED INTERVENTIONS:  97110-Therapeutic exercises,  02469- Therapeutic activity, [id number]- Neuromuscular re-education, [phone]- Self Care, [id number]- Manual therapy, [id number]- Electrical stimulation (unattended), 97035- Ultrasound, Patient/Family education, Dry Needling, Joint mobilization, Spinal mobilization, Scar mobilization, Cryotherapy, Moist heat, and Biofeedback  PLAN FOR NEXT SESSION:  internal work to pelvic floor , work on [employer] joint mobility, hip mobility and lumbar mobiltiy   Channing Pereyra, PT 08/16/23 2:48 PM

## 2023-09-04 ENCOUNTER — Telehealth: Payer: Self-pay | Admitting: Family Medicine

## 2023-09-04 DIAGNOSIS — F902 Attention-deficit hyperactivity disorder, combined type: Secondary | ICD-10-CM

## 2023-09-04 NOTE — Telephone Encounter (Unsigned)
 Copied from CRM [id number]. Topic: Clinical - Medication Refill >> Sep 04, 2023  2:41 PM Harlene ORN wrote: Medication:  amphetamine -dextroamphetamine  (ADDERALL) 20 MG tablet   Has the patient contacted their pharmacy? Yes (Agent: If no, request that the patient contact the pharmacy for the refill. If patient does not wish to contact the pharmacy document the reason why and proceed with request.) (Agent: If yes, when and what did the pharmacy advise?)  This is the patient's preferred pharmacy:  [facility] [id number] - [place], [place] - [address] [place] [address] Phone: [phone] Fax: [phone]   Is this the correct pharmacy for this prescription? Yes If no, delete pharmacy and type the correct one.   Has the prescription been filled recently? Yes  Is the patient out of the medication? No  Has the patient been seen for an appointment in the last year OR does the patient have an upcoming appointment? Yes  Can we respond through MyChart? Yes  Agent: Please be advised that Rx refills may take up to 3 business days. We ask that you follow-up with your pharmacy.

## 2023-09-05 MED ORDER — AMPHETAMINE-DEXTROAMPHETAMINE 20 MG PO TABS: 20.0000 mg | ORAL_TABLET | Freq: Every day | ORAL | 0 refills | Status: DC

## 2023-09-06 DIAGNOSIS — H9201 Otalgia, right ear: Secondary | ICD-10-CM | POA: Diagnosis not present

## 2023-09-08 ENCOUNTER — Telehealth: Payer: Self-pay

## 2023-09-08 NOTE — Telephone Encounter (Signed)
 Spoke with patient, 04/2018

## 2023-09-08 NOTE — Telephone Encounter (Signed)
 Copied from CRM [id number]. Topic: General - Other >> Sep 08, 2023 12:50 PM Diana Wolfe wrote: Reason for CRM: Patient would like to know when she had her last tetanus shot.

## 2023-09-15 ENCOUNTER — Ambulatory Visit: Payer: Self-pay | Attending: Family Medicine | Admitting: Physical Therapy

## 2023-09-15 ENCOUNTER — Encounter: Payer: Self-pay | Admitting: Physical Therapy

## 2023-09-15 DIAGNOSIS — R252 Cramp and spasm: Secondary | ICD-10-CM | POA: Insufficient documentation

## 2023-09-15 DIAGNOSIS — R278 Other lack of coordination: Secondary | ICD-10-CM | POA: Insufficient documentation

## 2023-09-15 DIAGNOSIS — R102 Pelvic and perineal pain: Secondary | ICD-10-CM | POA: Insufficient documentation

## 2023-09-15 NOTE — Therapy (Signed)
 OUTPATIENT PHYSICAL THERAPY FEMALE PELVIC TREATMENT   Patient Name: Diana Wolfe MRN: [id number] DOB:03/23/1950, 73 y.o., female Today's Date: 09/15/2023  END OF SESSION:  PT End of Session - 09/15/23 0844     Visit Number 7    Date for PT Re-Evaluation 11/09/23    Authorization Type Medicare/ [employer]    Authorization - Visit Number 7    Authorization - Number of Visits 10    PT Start Time 0845    PT Stop Time 0925    PT Time Calculation (min) 40 min    Activity Tolerance Patient tolerated treatment well    Behavior During Therapy [facility] for tasks assessed/performed          Past Medical History:  Diagnosis Date   ADD (attention deficit disorder)    Anxiety    Depression    GERD (gastroesophageal reflux disease)    Hyperlipidemia    Hypertension    Hypothyroid    PONV (postoperative nausea and vomiting)    Vitamin D  deficiency    Past Surgical History:  Procedure Laterality Date   BREAST BIOPSY Left 07/09/2019   CESAREAN SECTION     ESOPHAGOGASTRODUODENOSCOPY N/A 09/02/2015   Procedure: ESOPHAGOGASTRODUODENOSCOPY (EGD);  Surgeon: Lynwood Bohr, MD;  Location: [facility]COPY;  Service: Endoscopy;  Laterality: N/A;   ESOPHAGOGASTRODUODENOSCOPY N/A 06/05/2022   Procedure: ESOPHAGOGASTRODUODENOSCOPY (EGD);  Surgeon: Burnette Fallow, MD;  Location: [facility];  Service: Gastroenterology;  Laterality: N/A;   FOOT SURGERY Right    FOREIGN BODY REMOVAL N/A 06/05/2022   Procedure: FOREIGN BODY REMOVAL;  Surgeon: Burnette Fallow, MD;  Location: [facility] ENDOSCOPY;  Service: Gastroenterology;  Laterality: N/A;   IM NAILING TIBIA Left 01/09/2001   Dr. 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   Dysphagia 04/21/2020   Generalized abdominal pain 04/21/2020    Hematochezia 04/21/2020   Left lower quadrant pain 04/21/2020   History of colonic polyps 04/21/2020   Urticaria 04/21/2020   B12 deficiency 01/31/2019   Medication management 06/23/2014   Vaginal atrophy 03/27/2014   Hyperlipidemia    Hypertension    Anxiety    GERD (gastroesophageal reflux disease)    ADD (attention deficit disorder)    Depression    Vitamin D  deficiency, unspecified    Hypothyroidism 04/25/2008   Iron deficiency anemia 04/25/2008   Obstructive sleep apnea 11/16/2007   Allergic rhinitis 11/16/2007   Headache 11/16/2007   PULMONARY EMBOLISM, HX OF 11/16/2007    PCP: Alvia Corean CROME, FNP  REFERRING PROVIDER: Alvia Corean CROME, FNP   REFERRING DIAG:  [phone] (ICD-10-CM) - Medication management  M62.89 (ICD-10-CM) - Pelvic floor dysfunction in female    THERAPY DIAG:  Cramp and spasm  Other lack of coordination  Pelvic pain  Rationale for Evaluation and Treatment: Rehabilitation  ONSET DATE: 2021  SUBJECTIVE:  SUBJECTIVE STATEMENT: I am using the Estradial cream. It now does not hurt to place in. I am not having the urgency at this time. I have started to use the vaginal trainers and the first one does not hurt.  Fluid intake: water; tea  PAIN:  Are you having pain? Yes NPRS scale: 7/10 Pain location: Vaginal  Pain type: aching Pain description: intermittent   Aggravating factors: during vaginal penetration Relieving factors: no vaginal penetration  PRECAUTIONS: None  RED FLAGS: None   WEIGHT BEARING RESTRICTIONS: No  FALLS:  Has patient fallen in last 6 months? Yes. Number of falls 1 time slipped on ice.   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  URINATION: Pain with urination: No Fully empty bladder: Yes: sometimes will empty her bladder and 10 minutes have to urinate again Stream: Strong Urgency: Yes , getting out of car, sitting in house 08/07/23: feels the urgency after driving more than 1 hour; no urgency with sitting in house.  Frequency: night time 2-3 times: daytime: 6 times 07/31/23: wake up 2 times at night 08/07/23: wake up 1 time to urinate.  Leakage: Urge to void and Walking to the bathroom 08/07/23: urinary leakage 1 time since last week when she got out of the car and rushed to the bathroom Pads: No  INTERCOURSE:not sexually active at this time but in the future would like to be able to.    PREGNANCY: C-section deliveries 1  PROLAPSE: None   OBJECTIVE:  Note: Objective measures were completed at Evaluation unless otherwise noted.  DIAGNOSTIC FINDINGS:  none  PATIENT SURVEYS:  UIQ-7: 48 PFIQ-7: 44 08/16/23: UIQ-7: 33 PFIQ-7: 28  COGNITION: Overall cognitive status: Within functional limits for tasks assessed     SENSATION: Light touch: Appears intact   POSTURE: decreased lumbar lordosis   LUMBARAROM/PROM:  A/PROM A/PROM  eval  Extension Decreased by 50%   (Blank rows = not tested)  LOWER EXTREMITY ROM: bilateral hip ROM is full   LOWER EXTREMITY MMT:  MMT Right eval Left eval  Hip extension 4/5 4/5  Hip abduction 4/5 5/5   (Blank rows = not tested) PALPATION:    Pelvic Alignment: ASIS  Abdominal: decreased mobility of the tissue below the scar, decreased movement of the diaphragm, difficulty with opening her lower rib cage                External Perineal Exam: dry, redness along the vulva, white patch of skin along the inner left labia majora; Q-tip test was positive at 6, 11 and 1  O'Clock.                              Internal Pelvic Floor: pain with Q-tip therefore did not assess any further, when she bulged therapist did not see any prolapse, the vaginal canal was small  Patient confirms identification and approves PT to assess internal pelvic floor and treatment Yes  PELVIC MMT:not tested due to the pain   MMT 07/31/23  Vaginal 2/5  Internal Anal Sphincter   External Anal Sphincter   Puborectalis   Diastasis Recti   (Blank rows = not tested)  TODAY'S TREATMENT:  09/15/23 Manual: Scar tissue mobilization: Manual work to the scar in lower abdomen with fascial release going through restrictions and pulling the scar up working through the tissue Exercises: Stretches/mobility: Quadruped with knee on yoga block moving side to side, forward and back, to the side of yoga block with cat cow Posterior capsule stretch with hips in the z form  with going back and forth Sitting hamstring stretch Sitting hip adductor stretch Self-care: Educated patient on using the vaginal trainers, how to progress them, adding hip movements. Patient verbally understands    08/16/23 Exercises: Stretches/mobility: Lunge position to open up the anterior hip and posterior hip  Sitting v stretch with cues to not flex at the thoracic instead at the hips then reach to one leg holding by 30 sec  Cat cow in standing leaning on counter with tactile cues  Strengthening: Standing with one foot on yoga block and pulling red band across trunk toward the knee on yoga block 15 x each then go the other way to improve SI joint movement Standing on leaning on table with one foot on block and move up and down on block to work the SI joint and lumbar multifidi 15 x each Self-care: Educated patient on how to use the vaginal dilators to elongate the tissue, how to progress, how to clean, and use lubricant     08/07/23 Manual: Scar tissue mobilization: Scar work to the lower abdomen to  improve mobility and educated patient on how to perform at home Myofascial release: Fascial release around the lower abdomen, umbilicus, indirectly around the ureter, and pubovesical ligament Neuromuscular re-education: Core facilitation: Transverse abdominus contraction with ball squeeze and tactile cues to contract the upper and lower abdomen Supine hip flexion isometric to engage the lower abdominal area correctly Quadruped lift opposite arm and leg with abdominal contraction      PATIENT EDUCATION:  09/15/23 Education details: Access Code: [email or web address], abdominal scar massage Person educated: Patient Education method: Explanation, Demonstration, Tactile cues, Verbal cues, and Handouts Education comprehension: verbalized understanding, returned demonstration, verbal cues required, tactile cues required, and needs further education  HOME EXERCISE PROGRAM: 09/15/23 Access Code: [email or web address] URL: [email or web address] Date: 09/15/2023 Prepared by: Channing Pereyra  Program Notes sit on ball and massage the pelvic floor daily for several minutes.   Exercises - Seated Diaphragmatic Breathing  - 1 x daily - 7 x weekly - 3 sets - 10 reps - Seated Piriformis Stretch with Trunk Bend  - 1 x daily - 7 x weekly - 1 sets - 2 reps - 30 sec hold - Seated Happy Baby With Trunk Flexion For Pelvic Relaxation  - 1 x daily - 7 x weekly - 1 sets - 1 reps - 30 sec hold - Supine Piriformis Stretch with Leg Straight  - 1 x daily - 7 x weekly - 1 sets - 2 reps - 30 sec hold - Hooklying Transversus Abdominis Palpation  - 1 x daily - 3 x weekly - 1 sets - 10 reps - Hooklying Isometric Hip Flexion  - 1 x daily - 3 x weekly - 1 sets - 10 reps - Quadruped Pelvic Floor Contraction with Opposite Arm and Leg Lift  - 1 x daily - 1 x weekly - 2 sets - 10 reps - Posterior Chain Stretch  - 1 x daily - 3 x weekly - 1 sets - 5 reps - Butterfly Groin Stretch  - 1 x daily - 3 x weekly - 1 sets - 1  reps - 30 sec  hold - Quadruped Yoga Block Lift Off  - 1 x daily - 3 x weekly - 1 sets - 10 reps   ASSESSMENT:  CLINICAL IMPRESSION: Patient is a 73 y.o. female who was seen today for physical therapy  treatment for pelvis floor dysfunction.  Patient has not leaked urine since last visit. She started to use the first vaginal brainier.  She understands how to progress herself with the trainer. Patient has good elongation of her lower abdominal scar and tissue after the manual work. She has reduction of the urinary urgency. She was stretching her hips and SI joint today. Patient will benefit from skilled therapy to improve pelvic floor tissue health, elongation of the vaginal canal and working on coordination to reduce urgency.   OBJECTIVE IMPAIRMENTS: decreased coordination, decreased ROM, decreased strength, increased fascial restrictions, increased muscle spasms, impaired tone, and pain.   ACTIVITY LIMITATIONS: continence, toileting, and locomotion level  PARTICIPATION LIMITATIONS: interpersonal relationship, driving, shopping, and community activity  PERSONAL FACTORS: Fitness, Time since onset of injury/illness/exacerbation, and 1-2 comorbidities: Hypothyroid; Hypertension; Cesarean section are also affecting patient's functional outcome.   REHAB POTENTIAL: Good  CLINICAL DECISION MAKING: Evolving/moderate complexity  EVALUATION COMPLEXITY: Moderate   GOALS: Goals reviewed with patient? Yes  SHORT TERM GOALS: Target date: 06/22/23  Patient educated on vaginal moisturizers and understands how they are used and why to improve the health of the vaginal tissue.  Baseline: Goal status: Met 05/29/23  2.  Patient is able to perform diaphragmatic breathing to elongate her pelvic floor.  Baseline:  Goal status: met 07/24/23  3.  Patient understands was to perform perineal massage externally to start to relax the pelvic floor tissue.  Baseline:  Goal status: Met 07/31/23  4.  Patient is independent with  flexibility exercises to indirectly elongate the pelvic floor.  Baseline:  Goal status: Met 07/24/23  5.  Patient educated on vaginal dilators to assist with the expansion of the pelvic floor.  Baseline:  Goal status: met 08/16/23  LONG TERM GOALS: Target date: 11/09/23  Patient independent with advanced HEP to increase strength of core and pelvic floor.  Baseline:  Goal status: ongoing 08/16/23  2.  Patient is able to use the dilator that is appropriate for her goal to have vaginal penetration with pain level </= 0-2/10.  Baseline:  Goal status: ongoing 08/16/23  3.  Patient pelvic floor strength increased to reduce urinary leakage with urgency.  Baseline:  Goal status: ongoing 08/16/23  4.  Patient is able to use the behavioral technique to reduce the urge to void so she is able to slowly walk to the bathroom without leaking urine and enter her house.  Baseline:  Goal status: ongoing 08/16/23  5.  Patient reports she is able to urinate 1-2 times at night due to using the urge to void technique.  Baseline:  Goal status: Met 08/07/23   PLAN:  PT FREQUENCY: 1-2x/week  PT DURATION: 12 weeks  PLANNED INTERVENTIONS: 97110-Therapeutic exercises, 97530- Therapeutic activity, 97112- Neuromuscular re-education, 97535- Self Care, [id number]- Manual therapy, [id number]- Electrical stimulation (unattended), 97035- Ultrasound, Patient/Family education, Dry Needling, Joint mobilization, Spinal mobilization, Scar mobilization, Cryotherapy, Moist heat, and Biofeedback  PLAN FOR NEXT SESSION:  internal work to pelvic floor , work on [employer] joint mobility, hip mobility and lumbar mobiltiy   Channing Pereyra, PT 09/15/23 9:30 AM

## 2023-09-21 DIAGNOSIS — J029 Acute pharyngitis, unspecified: Secondary | ICD-10-CM | POA: Diagnosis not present

## 2023-09-21 DIAGNOSIS — R059 Cough, unspecified: Secondary | ICD-10-CM | POA: Diagnosis not present

## 2023-09-21 DIAGNOSIS — U071 COVID-19: Secondary | ICD-10-CM | POA: Diagnosis not present

## 2023-09-21 DIAGNOSIS — Z20822 Contact with and (suspected) exposure to covid-19: Secondary | ICD-10-CM | POA: Diagnosis not present

## 2023-09-22 ENCOUNTER — Telehealth: Payer: Self-pay | Admitting: Family Medicine

## 2023-09-22 ENCOUNTER — Other Ambulatory Visit: Payer: Self-pay | Admitting: Family Medicine

## 2023-09-22 DIAGNOSIS — R0602 Shortness of breath: Secondary | ICD-10-CM

## 2023-09-22 MED ORDER — PREDNISONE 20 MG PO TABS
40.0000 mg | ORAL_TABLET | Freq: Every day | ORAL | 0 refills | Status: AC
Start: 2023-09-22 — End: 2023-09-27

## 2023-09-22 NOTE — Telephone Encounter (Signed)
 Copied from CRM [id number]. Topic: Clinical - Medication Question >> Sep 22, 2023  9:43 AM Thersia BROCKS wrote: Reason for CRM: Patient called in regarding prescription stated she went to the urgent care and was diagnosed with COVID and was prescribed paxlovid stated that  with insurance it is 600 and she can not afford it wanted to know if there is a cheaper alternative she could be prescribed

## 2023-09-22 NOTE — Telephone Encounter (Signed)
 Spoke with patient, provider sent in steroid advised patient to hold it until Sunday if she is not having breathing difficulty and treat symptoms. Patient has mucinex, tylenol , and robitussin.

## 2023-09-27 ENCOUNTER — Ambulatory Visit: Admitting: Physical Therapy

## 2023-09-30 ENCOUNTER — Other Ambulatory Visit: Payer: Self-pay | Admitting: Family Medicine

## 2023-10-08 ENCOUNTER — Encounter: Payer: Self-pay | Admitting: Family Medicine

## 2023-10-09 ENCOUNTER — Ambulatory Visit: Attending: Family Medicine | Admitting: Physical Therapy

## 2023-10-09 ENCOUNTER — Encounter: Payer: Self-pay | Admitting: Physical Therapy

## 2023-10-09 DIAGNOSIS — R252 Cramp and spasm: Secondary | ICD-10-CM | POA: Diagnosis not present

## 2023-10-09 DIAGNOSIS — R278 Other lack of coordination: Secondary | ICD-10-CM | POA: Diagnosis not present

## 2023-10-09 DIAGNOSIS — R102 Pelvic and perineal pain: Secondary | ICD-10-CM | POA: Diagnosis not present

## 2023-10-09 NOTE — Therapy (Signed)
 OUTPATIENT PHYSICAL THERAPY FEMALE PELVIC TREATMENT   Patient Name: Diana Wolfe MRN: [id number] DOB:Apr 13, 1950, 73 y.o., female Today's Date: 10/09/2023  END OF SESSION:  PT End of Session - 10/09/23 1529     Visit Number 8    Date for PT Re-Evaluation 11/09/23    Authorization Type Medicare/ [employer]    Authorization - Visit Number 8    Authorization - Number of Visits 10    PT Start Time 1530    PT Stop Time 1610    PT Time Calculation (min) 40 min    Activity Tolerance Patient tolerated treatment well    Behavior During Therapy WFL for tasks assessed/performed          Past Medical History:  Diagnosis Date   ADD (attention deficit disorder)    Anxiety    Depression    GERD (gastroesophageal reflux disease)    Hyperlipidemia    Hypertension    Hypothyroid    PONV (postoperative nausea and vomiting)    Vitamin D  deficiency    Past Surgical History:  Procedure Laterality Date   BREAST BIOPSY Left 07/09/2019   CESAREAN SECTION     ESOPHAGOGASTRODUODENOSCOPY N/A 09/02/2015   Procedure: ESOPHAGOGASTRODUODENOSCOPY (EGD);  Surgeon: Lynwood Bohr, MD;  Location: [facility] END[facility];  Service: Endoscopy;  Laterality: N/A;   ESOPHAGOGASTRODUODENOSCOPY N/A 06/05/2022   Procedure: ESOPHAGOGASTRODUODENOSCOPY (EGD);  Surgeon: Burnette Fallow, MD;  Location: [facility];  Service: Gastroenterology;  Laterality: N/A;   FOOT SURGERY Right    FOREIGN BODY REMOVAL N/A 06/05/2022   Procedure: FOREIGN BODY REMOVAL;  Surgeon: Burnette Fallow, MD;  Location: [facility] ENDOSCOPY;  Service: Gastroenterology;  Laterality: N/A;   IM NAILING TIBIA Left 01/09/2001   Dr. 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   Dysphagia 04/21/2020   Generalized abdominal pain 04/21/2020    Hematochezia 04/21/2020   Left lower quadrant pain 04/21/2020   History of colonic polyps 04/21/2020   Urticaria 04/21/2020   B12 deficiency 01/31/2019   Medication management 06/23/2014   Vaginal atrophy 03/27/2014   Hyperlipidemia    Hypertension    Anxiety    GERD (gastroesophageal reflux disease)    ADD (attention deficit disorder)    Depression    Vitamin D  deficiency, unspecified    Hypothyroidism 04/25/2008   Iron deficiency anemia 04/25/2008   Obstructive sleep apnea 11/16/2007   Allergic rhinitis 11/16/2007   Headache 11/16/2007   PULMONARY EMBOLISM, HX OF 11/16/2007    PCP: Alvia Corean CROME, FNP  REFERRING PROVIDER: Alvia Corean CROME, FNP   REFERRING DIAG:  [phone] (ICD-10-CM) - Medication management  M62.89 (ICD-10-CM) - Pelvic floor dysfunction in female    THERAPY DIAG:  Cramp and spasm  Other lack of coordination  Pelvic pain  Rationale for Evaluation and Treatment: Rehabilitation  ONSET DATE: 2021  SUBJECTIVE:  SUBJECTIVE STATEMENT: I had COVID and had to cancel. I did not do the exercises since I was sick. I have only used the first size dilator. I still have to urinate every 2-3 hours. She has leaked several times when trying to get to the bathroom with a small spot. I did not leak with coughing.    PAIN:  Are you having pain? Yes NPRS scale: 7/10 Pain location: Vaginal  Pain type: aching Pain description: intermittent   Aggravating factors: during vaginal penetration Relieving factors: no vaginal penetration  PRECAUTIONS: None  RED FLAGS: None   WEIGHT BEARING RESTRICTIONS: No  FALLS:  Has patient fallen in last 6 months? Yes. Number of falls 1 time slipped on ice.   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  URINATION: Pain with urination: No Fully empty bladder: Yes: sometimes will empty her bladder and 10 minutes have to urinate again Stream: Strong Urgency: Yes , getting out of car, sitting in house 08/07/23: feels the urgency after driving more than 1 hour; no urgency with sitting in house.  Frequency: night time 2-3 times: daytime: 6 times 07/31/23: wake up 2 times at night 08/07/23: wake up 1 time to urinate.  Leakage: Urge to void and Walking to the bathroom 08/07/23: urinary leakage 1 time since last week when she got out of the car and rushed to the bathroom Pads: No  INTERCOURSE:not sexually active at this time but in the future would like to be able to.    PREGNANCY: C-section deliveries 1  PROLAPSE: None   OBJECTIVE:  Note: Objective measures were completed at Evaluation unless otherwise noted.  DIAGNOSTIC FINDINGS:  none  PATIENT SURVEYS:  UIQ-7: 48 PFIQ-7: 44 08/16/23: UIQ-7: 33 PFIQ-7: 28  COGNITION: Overall cognitive status: Within functional limits for tasks assessed     SENSATION: Light touch: Appears intact   POSTURE: decreased lumbar lordosis   LUMBARAROM/PROM:  A/PROM A/PROM  eval  Extension Decreased by 50%   (Blank rows = not tested)  LOWER EXTREMITY ROM: bilateral hip ROM is full   LOWER EXTREMITY MMT:  MMT Right eval Left eval  Hip extension 4/5 4/5  Hip abduction 4/5 5/5   (Blank rows = not tested) PALPATION:    Pelvic Alignment: ASIS  Abdominal: decreased mobility of the tissue below the scar, decreased movement of the diaphragm, difficulty with opening her lower rib cage                External Perineal Exam: dry, redness along the vulva, white patch of skin along the inner  left labia majora; Q-tip test was positive at 6, 11 and 1 O'Clock.                              Internal Pelvic Floor: pain with Q-tip therefore did not assess any further, when she bulged therapist did not see any prolapse, the vaginal canal was small  Patient confirms identification and approves PT to assess internal pelvic floor and treatment Yes  PELVIC MMT:not tested due to the pain   MMT 07/31/23  Vaginal 2/5  Internal Anal Sphincter   External Anal Sphincter   Puborectalis   Diastasis Recti   (  Blank rows = not tested)         TODAY'S TREATMENT:  10/09/23 Exercises: Stretches/mobility: Sitting piriformis stretch holding 30 sec Sitting keeping knees together moving feet apart to stretch the pelvic floor Strengthening: Supine alternate hip and shoulder flexion 20 x  Supine bridge with bilateral shoulder shoulder extension 15 x  Standing move red band toward opposite hand to engage the core Standing chop with red band 15 x each way Standing pull the red band into shoulder extension and alternate hip flexion 15 x each  Plank on wall lifting opposite arm and leg 15 x each Self-care: Discussed with patient on her using the dilators, how to progress.     09/15/23 Manual: Scar tissue mobilization: Manual work to the scar in lower abdomen with fascial release going through restrictions and pulling the scar up working through the tissue Exercises: Stretches/mobility: Quadruped with knee on yoga block moving side to side, forward and back, to the side of yoga block with cat cow Posterior capsule stretch with hips in the z form  with going back and forth Sitting hamstring stretch Sitting hip adductor stretch Self-care: Educated patient on using the vaginal trainers, how to progress them, adding hip movements. Patient verbally understands    08/16/23 Exercises: Stretches/mobility: Lunge position to open up the anterior hip and posterior hip  Sitting v stretch with cues to not  flex at the thoracic instead at the hips then reach to one leg holding by 30 sec  Cat cow in standing leaning on counter with tactile cues  Strengthening: Standing with one foot on yoga block and pulling red band across trunk toward the knee on yoga block 15 x each then go the other way to improve SI joint movement Standing on leaning on table with one foot on block and move up and down on block to work the SI joint and lumbar multifidi 15 x each Self-care: Educated patient on how to use the vaginal dilators to elongate the tissue, how to progress, how to clean, and use lubricant      PATIENT EDUCATION:  09/15/23 Education details: Access Code: [email or web address], abdominal scar massage Person educated: Patient Education method: Explanation, Demonstration, Tactile cues, Verbal cues, and Handouts Education comprehension: verbalized understanding, returned demonstration, verbal cues required, tactile cues required, and needs further education  HOME EXERCISE PROGRAM: 09/15/23 Access Code: [email or web address] URL: [email or web address] Date: 09/15/2023 Prepared by: Channing Pereyra  Program Notes sit on ball and massage the pelvic floor daily for several minutes.   Exercises - Seated Diaphragmatic Breathing  - 1 x daily - 7 x weekly - 3 sets - 10 reps - Seated Piriformis Stretch with Trunk Bend  - 1 x daily - 7 x weekly - 1 sets - 2 reps - 30 sec hold - Seated Happy Baby With Trunk Flexion For Pelvic Relaxation  - 1 x daily - 7 x weekly - 1 sets - 1 reps - 30 sec hold - Supine Piriformis Stretch with Leg Straight  - 1 x daily - 7 x weekly - 1 sets - 2 reps - 30 sec hold - Hooklying Transversus Abdominis Palpation  - 1 x daily - 3 x weekly - 1 sets - 10 reps - Hooklying Isometric Hip Flexion  - 1 x daily - 3 x weekly - 1 sets - 10 reps - Quadruped Pelvic Floor Contraction with Opposite Arm and Leg Lift  - 1 x daily - 1 x weekly - 2 sets - 10 reps - Posterior Chain  Stretch  - 1 x daily - 3 x weekly -  1 sets - 5 reps - Butterfly Groin Stretch  - 1 x daily - 3 x weekly - 1 sets - 1 reps - 30 sec hold - Quadruped Yoga Block Lift Off  - 1 x daily - 3 x weekly - 1 sets - 10 reps   ASSESSMENT:  CLINICAL IMPRESSION: Patient is a 73 y.o. female who was seen today for physical therapy  treatment for pelvis floor dysfunction.  Patient has not leaked with coughing. She will leak after a car ride for 3 hours and has the urge to urinate as she is walking to the commode. She is on the first dilator. She had COVID so set her back on her exercise program. She is working on her endurance for the pelvic floor.  Patient will benefit from skilled therapy to improve pelvic floor tissue health, elongation of the vaginal canal and working on coordination to reduce urgency.   OBJECTIVE IMPAIRMENTS: decreased coordination, decreased ROM, decreased strength, increased fascial restrictions, increased muscle spasms, impaired tone, and pain.   ACTIVITY LIMITATIONS: continence, toileting, and locomotion level  PARTICIPATION LIMITATIONS: interpersonal relationship, driving, shopping, and community activity  PERSONAL FACTORS: Fitness, Time since onset of injury/illness/exacerbation, and 1-2 comorbidities: Hypothyroid; Hypertension; Cesarean section are also affecting patient's functional outcome.   REHAB POTENTIAL: Good  CLINICAL DECISION MAKING: Evolving/moderate complexity  EVALUATION COMPLEXITY: Moderate   GOALS: Goals reviewed with patient? Yes  SHORT TERM GOALS: Target date: 06/22/23  Patient educated on vaginal moisturizers and understands how they are used and why to improve the health of the vaginal tissue.  Baseline: Goal status: Met 05/29/23  2.  Patient is able to perform diaphragmatic breathing to elongate her pelvic floor.  Baseline:  Goal status: met 07/24/23  3.  Patient understands was to perform perineal massage externally to start to relax the pelvic floor tissue.  Baseline:  Goal status: Met  07/31/23  4.  Patient is independent with flexibility exercises to indirectly elongate the pelvic floor.  Baseline:  Goal status: Met 07/24/23  5.  Patient educated on vaginal dilators to assist with the expansion of the pelvic floor.  Baseline:  Goal status: met 08/16/23  LONG TERM GOALS: Target date: 11/09/23  Patient independent with advanced HEP to increase strength of core and pelvic floor.  Baseline:  Goal status: ongoing 08/16/23  2.  Patient is able to use the dilator that is appropriate for her goal to have vaginal penetration with pain level </= 0-2/10.  Baseline:  Goal status: ongoing 08/16/23  3.  Patient pelvic floor strength increased to reduce urinary leakage with urgency.  Baseline:  Goal status: ongoing 08/16/23  4.  Patient is able to use the behavioral technique to reduce the urge to void so she is able to slowly walk to the bathroom without leaking urine and enter her house.  Baseline:  Goal status: ongoing 08/16/23  5.  Patient reports she is able to urinate 1-2 times at night due to using the urge to void technique.  Baseline:  Goal status: Met 08/07/23   PLAN:  PT FREQUENCY: 1-2x/week  PT DURATION: 12 weeks  PLANNED INTERVENTIONS: 97110-Therapeutic exercises, 97530- Therapeutic activity, 97112- Neuromuscular re-education, 97535- Self Care, [id number]- Manual therapy, [id number]- Electrical stimulation (unattended), 97035- Ultrasound, Patient/Family education, Dry Needling, Joint mobilization, Spinal mobilization, Scar mobilization, Cryotherapy, Moist heat, and Biofeedback  PLAN FOR NEXT SESSION:  internal work to pelvic floor , work on [employer] joint mobility,  hip mobility and lumbar mobiltiy   Channing Pereyra, PT 10/09/23 4:12 PM

## 2023-10-18 ENCOUNTER — Encounter: Payer: Self-pay | Admitting: Physical Therapy

## 2023-10-18 ENCOUNTER — Ambulatory Visit: Admitting: Physical Therapy

## 2023-10-18 ENCOUNTER — Ambulatory Visit: Payer: PRIVATE HEALTH INSURANCE | Admitting: Nurse Practitioner

## 2023-10-18 DIAGNOSIS — R102 Pelvic and perineal pain: Secondary | ICD-10-CM | POA: Diagnosis not present

## 2023-10-18 DIAGNOSIS — R252 Cramp and spasm: Secondary | ICD-10-CM | POA: Diagnosis not present

## 2023-10-18 DIAGNOSIS — R278 Other lack of coordination: Secondary | ICD-10-CM

## 2023-10-18 NOTE — Therapy (Signed)
 OUTPATIENT PHYSICAL THERAPY FEMALE PELVIC TREATMENT   Patient Name: Diana Wolfe MRN: [id number] DOB:[birth year], 73 y.o., female Today's Date: 10/18/2023  END OF SESSION:  PT End of Session - 10/18/23 1532     Visit Number 9    Date for PT Re-Evaluation 11/09/23    Authorization Type Medicare/ [employer]    Authorization - Visit Number 9    Authorization - Number of Visits 10    PT Start Time 1530    PT Stop Time 1610    PT Time Calculation (min) 40 min    Activity Tolerance Patient tolerated treatment well    Behavior During Therapy WFL for tasks assessed/performed          Past Medical History:  Diagnosis Date   ADD (attention deficit disorder)    Anxiety    Depression    GERD (gastroesophageal reflux disease)    Hyperlipidemia    Hypertension    Hypothyroid    PONV (postoperative nausea and vomiting)    Vitamin D  deficiency    Past Surgical History:  Procedure Laterality Date   BREAST BIOPSY Left 07/09/2019   CESAREAN SECTION     ESOPHAGOGASTRODUODENOSCOPY N/A 09/02/2015   Procedure: ESOPHAGOGASTRODUODENOSCOPY (EGD);  Surgeon: Lynwood Bohr, MD;  Location: [facility] ENDOSCOPY;  Service: Endoscopy;  Laterality: N/A;   ESOPHAGOGASTRODUODENOSCOPY N/A 06/05/2022   Procedure: ESOPHAGOGASTRODUODENOSCOPY (EGD);  Surgeon: Burnette Fallow, MD;  Location: [facility];  Service: Gastroenterology;  Laterality: N/A;   FOOT SURGERY Right    FOREIGN BODY REMOVAL N/A 06/05/2022   Procedure: FOREIGN BODY REMOVAL;  Surgeon: Burnette Fallow, MD;  Location: [facility] ENDOSCOPY;  Service: Gastroenterology;  Laterality: N/A;   IM NAILING TIBIA Left 01/09/2001   Dr. 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   Dysphagia 04/21/2020   Generalized abdominal pain 04/21/2020    Hematochezia 04/21/2020   Left lower quadrant pain 04/21/2020   History of colonic polyps 04/21/2020   Urticaria 04/21/2020   B12 deficiency 01/31/2019   Medication management 06/23/2014   Vaginal atrophy 03/27/2014   Hyperlipidemia    Hypertension    Anxiety    GERD (gastroesophageal reflux disease)    ADD (attention deficit disorder)    Depression    Vitamin D  deficiency, unspecified    Hypothyroidism 04/25/2008   Iron deficiency anemia 04/25/2008   Obstructive sleep apnea 11/16/2007   Allergic rhinitis 11/16/2007   Headache 11/16/2007   PULMONARY EMBOLISM, HX OF 11/16/2007    PCP: Alvia Corean CROME, FNP  REFERRING PROVIDER: Alvia Corean CROME, FNP   REFERRING DIAG:  [phone] (ICD-10-CM) - Medication management  M62.89 (ICD-10-CM) - Pelvic floor dysfunction in female    THERAPY DIAG:  Cramp and spasm  Other lack of coordination  Pelvic pain  Rationale for Evaluation and Treatment: Rehabilitation  ONSET DATE: 2021  SUBJECTIVE:  SUBJECTIVE STATEMENT: I have used the next size trainer and it was not painful.    PAIN:  Are you having pain? Yes NPRS scale: 7/10 Pain location: Vaginal  Pain type: aching Pain description: intermittent   Aggravating factors: during vaginal penetration Relieving factors: no vaginal penetration  PRECAUTIONS: None  RED FLAGS: None   WEIGHT BEARING RESTRICTIONS: No  FALLS:  Has patient fallen in last 6 months? Yes. Number of falls 1 time slipped on ice.   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  URINATION: Pain with urination: No Fully empty bladder: Yes: sometimes will empty her bladder and 10 minutes have to urinate again Stream: Strong Urgency: Yes , getting out of car, sitting in house 08/07/23: feels the urgency after driving more than 1 hour; no urgency with sitting in house.  Frequency: night time 2-3 times: daytime: 6 times 07/31/23: wake up 2 times at night 08/07/23: wake up 1 time to urinate.  Leakage: Urge to void and Walking to the bathroom 08/07/23: urinary leakage 1 time since last week when she got out of the car and rushed to the bathroom Pads: No  INTERCOURSE:not sexually active at this time but in the future would like to be able to.    PREGNANCY: C-section deliveries 1  PROLAPSE: None   OBJECTIVE:  Note: Objective measures were completed at Evaluation unless otherwise noted.  DIAGNOSTIC FINDINGS:  none  PATIENT SURVEYS:  UIQ-7: 48 PFIQ-7: 44 08/16/23: UIQ-7: 33 PFIQ-7: 28  COGNITION: Overall cognitive status: Within functional limits for tasks assessed     SENSATION: Light touch: Appears intact   POSTURE: decreased lumbar lordosis   LUMBARAROM/PROM:  A/PROM A/PROM  eval  Extension Decreased by 50%   (Blank rows = not tested)  LOWER EXTREMITY ROM: bilateral hip ROM is full   LOWER EXTREMITY MMT:  MMT Right eval Left eval  Hip extension 4/5 4/5  Hip abduction 4/5 5/5   (Blank rows = not tested) PALPATION:    Pelvic Alignment: ASIS  Abdominal: decreased mobility of the tissue below the scar, decreased movement of the diaphragm, difficulty with opening her lower rib cage                External Perineal Exam: dry, redness along the vulva, white patch of skin along the inner left labia majora; Q-tip test was positive at 6, 11 and 1 O'Clock.                              Internal Pelvic Floor: pain with Q-tip therefore did not assess any further, when she bulged therapist did not  see any prolapse, the vaginal canal was small  Patient confirms identification and approves PT to assess internal pelvic floor and treatment Yes  PELVIC MMT:not tested due to the pain   MMT 07/31/23  Vaginal 2/5  Internal Anal Sphincter   External Anal Sphincter   Puborectalis   Diastasis Recti   (Blank rows = not tested)         TODAY'S TREATMENT:  10/18/23 Manual: Myofascial release: Fascial release to the urogenital diaphragm and perineal body to elongate  Internal pelvic floor techniques: No emotional/communication barriers or cognitive limitation.  Patient is motivated to learn. Patient understands and agrees with treatment goals and plan. PT explains patient will be examined in standing, sitting, and lying down to see how their muscles and joints work. When they are ready, they will be asked to remove their underwear so PT can examine their perineum. The patient is also given the option of providing their own chaperone as one is not provided in our facility. The patient also has the right and is explained the right to defer or refuse any part of the evaluation or treatment including the internal exam. With the patient's consent, PT will use one gloved finger to gently assess the muscles of the pelvic floor, seeing how well it contracts and relaxes and if there is muscle symmetry. After, the patient will get dressed and PT and patient will discuss exam findings and plan of care. PT and patient discuss plan of care, schedule, attendance policy and HEP activities.  Therapist gloved finger in the vaginal canal working on the left levator ani, obturator internist Therapist gloved finger in the vaginal canal working on the left side of the bladder with the other hand on the outside of the bladder to increase mobility Exercises: Stretches/mobility: Sitting piriformis stretch holding 30 sec Self-care: Educated patient on how to progress to the next size dilator since she is not having pain  with the smaller one.     10/09/23 Exercises: Stretches/mobility: Sitting piriformis stretch holding 30 sec Sitting keeping knees together moving feet apart to stretch the pelvic floor Strengthening: Supine alternate hip and shoulder flexion 20 x  Supine bridge with bilateral shoulder shoulder extension 15 x  Standing move red band toward opposite hand to engage the core Standing chop with red band 15 x each way Standing pull the red band into shoulder extension and alternate hip flexion 15 x each  Plank on wall lifting opposite arm and leg 15 x each Self-care: Discussed with patient on her using the dilators, how to progress.     09/15/23 Manual: Scar tissue mobilization: Manual work to the scar in lower abdomen with fascial release going through restrictions and pulling the scar up working through the tissue Exercises: Stretches/mobility: Quadruped with knee on yoga block moving side to side, forward and back, to the side of yoga block with cat cow Posterior capsule stretch with hips in the z form  with going back and forth Sitting hamstring stretch Sitting hip adductor stretch Self-care: Educated patient on using the vaginal trainers, how to progress them, adding hip movements. Patient verbally understands      PATIENT EDUCATION:  09/15/23 Education details: Access Code: [email or web address], abdominal scar massage Person educated: Patient Education method: Explanation, Demonstration, Tactile cues, Verbal cues, and Handouts Education comprehension: verbalized understanding, returned demonstration, verbal cues required, tactile cues required, and needs further education  HOME EXERCISE PROGRAM: 09/15/23 Access Code: [email or web address] URL: [email or web address] Date: 09/15/2023 Prepared by: Channing Pereyra  Program Notes sit on ball and massage the pelvic floor daily for several minutes.   Exercises - Seated Diaphragmatic Breathing  - 1 x daily - 7 x weekly - 3 sets - 10 reps -  Seated Piriformis Stretch with Trunk Bend  - 1 x daily - 7 x weekly - 1 sets - 2 reps - 30 sec hold - Seated Happy Baby With Trunk Flexion For Pelvic Relaxation  - 1 x daily - 7 x weekly - 1 sets - 1 reps - 30 sec hold - Supine Piriformis Stretch with Leg Straight  -  1 x daily - 7 x weekly - 1 sets - 2 reps - 30 sec hold - Hooklying Transversus Abdominis Palpation  - 1 x daily - 3 x weekly - 1 sets - 10 reps - Hooklying Isometric Hip Flexion  - 1 x daily - 3 x weekly - 1 sets - 10 reps - Quadruped Pelvic Floor Contraction with Opposite Arm and Leg Lift  - 1 x daily - 1 x weekly - 2 sets - 10 reps - Posterior Chain Stretch  - 1 x daily - 3 x weekly - 1 sets - 5 reps - Butterfly Groin Stretch  - 1 x daily - 3 x weekly - 1 sets - 1 reps - 30 sec hold - Quadruped Yoga Block Lift Off  - 1 x daily - 3 x weekly - 1 sets - 10 reps   ASSESSMENT:  CLINICAL IMPRESSION: Patient is a 73 y.o. female who was seen today for physical therapy  treatment for pelvis floor dysfunction.  Patient has leaked 1 time in the past few weeks after being in the car. Sometimes patient has to urinate within an hour after driving still.  Patient had some restrictions on the left side of the bladder. The left side of the pelvic floor was softer after manual work. Patient had some tightness in the urogenital diaphragm. Patient will benefit from skilled therapy to improve pelvic floor tissue health, elongation of the vaginal canal and working on coordination to reduce urgency.   OBJECTIVE IMPAIRMENTS: decreased coordination, decreased ROM, decreased strength, increased fascial restrictions, increased muscle spasms, impaired tone, and pain.   ACTIVITY LIMITATIONS: continence, toileting, and locomotion level  PARTICIPATION LIMITATIONS: interpersonal relationship, driving, shopping, and community activity  PERSONAL FACTORS: Fitness, Time since onset of injury/illness/exacerbation, and 1-2 comorbidities: Hypothyroid; Hypertension;  Cesarean section are also affecting patient's functional outcome.   REHAB POTENTIAL: Good  CLINICAL DECISION MAKING: Evolving/moderate complexity  EVALUATION COMPLEXITY: Moderate   GOALS: Goals reviewed with patient? Yes  SHORT TERM GOALS: Target date: 06/22/23  Patient educated on vaginal moisturizers and understands how they are used and why to improve the health of the vaginal tissue.  Baseline: Goal status: Met 05/29/23  2.  Patient is able to perform diaphragmatic breathing to elongate her pelvic floor.  Baseline:  Goal status: met 07/24/23  3.  Patient understands was to perform perineal massage externally to start to relax the pelvic floor tissue.  Baseline:  Goal status: Met 07/31/23  4.  Patient is independent with flexibility exercises to indirectly elongate the pelvic floor.  Baseline:  Goal status: Met 07/24/23  5.  Patient educated on vaginal dilators to assist with the expansion of the pelvic floor.  Baseline:  Goal status: met 08/16/23  LONG TERM GOALS: Target date: 11/09/23  Patient independent with advanced HEP to increase strength of core and pelvic floor.  Baseline:  Goal status: ongoing 08/16/23  2.  Patient is able to use the dilator that is appropriate for her goal to have vaginal penetration with pain level </= 0-2/10.  Baseline:  Goal status: ongoing 08/16/23  3.  Patient pelvic floor strength increased to reduce urinary leakage with urgency.  Baseline:  Goal status: ongoing 08/16/23  4.  Patient is able to use the behavioral technique to reduce the urge to void so she is able to slowly walk to the bathroom without leaking urine and enter her house.  Baseline:  Goal status: ongoing 08/16/23  5.  Patient reports she is able to urinate  1-2 times at night due to using the urge to void technique.  Baseline:  Goal status: Met 08/07/23   PLAN:  PT FREQUENCY: 1-2x/week  PT DURATION: 12 weeks  PLANNED INTERVENTIONS: 97110-Therapeutic exercises, 97530-  Therapeutic activity, 97112- Neuromuscular re-education, 97535- Self Care, [id number]- Manual therapy, [id number]- Electrical stimulation (unattended), 97035- Ultrasound, Patient/Family education, Dry Needling, Joint mobilization, Spinal mobilization, Scar mobilization, Cryotherapy, Moist heat, and Biofeedback  PLAN FOR NEXT SESSION:  internal work to pelvic floor  on the right, work on [employer] joint mobility, hip mobility and lumbar mobiltiy   Channing Pereyra, PT 10/18/23 4:11 PM

## 2023-10-19 ENCOUNTER — Ambulatory Visit

## 2023-10-20 ENCOUNTER — Ambulatory Visit

## 2023-10-25 ENCOUNTER — Ambulatory Visit

## 2023-10-25 VITALS — BP 128/70 | Ht 61.0 in | Wt 155.8 lb

## 2023-10-25 DIAGNOSIS — K219 Gastro-esophageal reflux disease without esophagitis: Secondary | ICD-10-CM

## 2023-10-25 DIAGNOSIS — R21 Rash and other nonspecific skin eruption: Secondary | ICD-10-CM

## 2023-10-25 DIAGNOSIS — Z0000 Encounter for general adult medical examination without abnormal findings: Secondary | ICD-10-CM

## 2023-10-25 NOTE — Patient Instructions (Addendum)
 Ms. Diana Wolfe , Thank you for taking time out of your busy schedule to complete your Annual Wellness Visit with me. I enjoyed our conversation and look forward to speaking with you again next year. I, as well as your care team,  appreciate your ongoing commitment to your health goals. Please review the following plan we discussed and let me know if I can assist you in the future. Your Game plan/ To Do List    Referrals: If you haven't heard from the office you've been referred to, please reach out to them at the phone provided.   Follow up Visits: We will see or speak with you next year for your Next Medicare AWV with our clinical staff Have you seen your provider in the last 6 months (3 months if uncontrolled diabetes)? No  Clinician Recommendations:  Aim for 30 minutes of exercise or brisk walking, 6-8 glasses of water, and 5 servings of fruits and vegetables each day. Educated and advised on getting the Shingles vaccines in 2025.      This is a list of the screenings recommended for you:  Health Maintenance  Topic Date Due   Zoster (Shingles) Vaccine (1 of 2) Never done   COVID-19 Vaccine (2 - Pfizer risk series) 02/20/2020   Flu Shot  09/22/2023   Mammogram  10/20/2024   Medicare Annual Wellness Visit  10/24/2024   DTaP/Tdap/Td vaccine (3 - Tdap) 05/15/2028   Colon Cancer Screening  12/01/2029   Pneumococcal Vaccine for age over 63  Completed   DEXA scan (bone density measurement)  Completed   Hepatitis C Screening  Completed   HPV Vaccine  Aged Out   Meningitis B Vaccine  Aged Out    Advanced directives: (Provided) Advance directive discussed with you today. I have provided a copy for you to complete at home and have notarized. Once this is complete, please bring a copy in to our office so we can scan it into your chart.  Advance Care Planning is important because it:  [x]  Makes sure you receive the medical care that is consistent with your values, goals, and preferences  [x]  It  provides guidance to your family and loved ones and reduces their decisional burden about whether or not they are making the right decisions based on your wishes.  Follow the link provided in your after visit summary or read over the paperwork we have mailed to you to help you started getting your Advance Directives in place. If you need assistance in completing these, please reach out to us  so that we can help you!

## 2023-10-25 NOTE — Progress Notes (Addendum)
 Subjective:  Please attest and cosign this visit due to patients primary care provider not being in the office at the time the visit was completed.  (Pt of Diana Ku, NP)   Diana Wolfe is a 73 y.o. who presents for a Medicare Wellness preventive visit.  As a reminder, Annual Wellness Visits don't include a physical exam, and some assessments may be limited, especially if this visit is performed virtually. We may recommend an in-person follow-up visit with your provider if needed.  Visit Complete: In person  Persons Participating in Visit: Patient.  AWV Questionnaire: Yes: Patient Medicare AWV questionnaire was completed by the patient on 10/22/2023; I have confirmed that all information answered by patient is correct and no changes since this date.  Cardiac Risk Factors include: advanced age (>58men, >31 women);dyslipidemia;hypertension     Objective:    Today's Vitals   10/25/23 0932  BP: 128/70  Weight: 155 lb 12.8 oz (70.7 kg)  Height: 5' 1 (1.549 m)   Body mass index is 29.44 kg/m.     10/25/2023    9:30 AM 05/25/2023    2:20 PM 10/18/2022   11:37 AM 06/05/2022    9:51 AM 10/15/2020   11:42 AM 05/17/2019   12:05 PM 05/17/2019   10:40 AM  Advanced Directives  Does Patient Have a Medical Advance Directive? No Yes Yes No Yes Yes Yes  Type of Furniture conservator/restorer;Living will Healthcare Power of [place];Living will  Healthcare Power of [place];Living will Healthcare Power of [place];Living will Healthcare Power of [place];Living will  Does patient want to make changes to medical advance directive?  No - Patient declined No - Patient declined  No - Patient declined No - Patient declined   Copy of Healthcare Power of Attorney in Chart?  No - copy requested No - copy requested  No - copy requested  No - copy requested  Would patient like information on creating a medical advance directive? Yes (MAU/Ambulatory/Procedural Areas - Information  given)          Current Medications (verified) Outpatient Encounter Medications as of 10/25/2023  Medication Sig   amphetamine -dextroamphetamine  (ADDERALL) 20 MG tablet Take 1 tablet (20 mg total) by mouth daily.   amphetamine -dextroamphetamine  (ADDERALL) 20 MG tablet Take 1 tablet (20 mg total) by mouth daily.   [START ON 11/04/2023] amphetamine -dextroamphetamine  (ADDERALL) 20 MG tablet Take 1 tablet (20 mg total) by mouth daily.   aspirin 81 MG chewable tablet 3-4 days a week   cetirizine (ZYRTEC) 10 MG tablet PRN   Cyanocobalamin  (B-12) 1000 MCG TABS Take by mouth.   diclofenac (VOLTAREN) 75 MG EC tablet Take 75 mg by mouth 2 (two) times daily.   estradiol  (ESTRACE  VAGINAL) 0.1 MG/GM vaginal cream Place 1 Applicatorful vaginally at bedtime.   hydrALAZINE  (APRESOLINE ) 10 MG tablet Take half tablet as needed for systolic blood pressure (top number) more than 190. If blood pressure remains more than 190 one hour after taking half tablet, may take additional half tablet.   levothyroxine  (SYNTHROID ) 75 MCG tablet TAKE 1 AND 1/2 TABLETS BY MOUTH ON MONDAYS, WEDNESDAYS AND FRIDAYS THEN TAKE 1 TABLET ON OTHER DAYS OF THE WEEK. TAKE ON EMPTY STOMACH WITH ONLY WATER FOR 30 MINUTES AND NO ANTACIDS, CALCIUM  OR MAGNESIUM FOR 4 HOURS AND AVOID BIOTIN   LORazepam  (ATIVAN ) 0.5 MG tablet Take 1 tablet (0.5 mg total) by mouth at bedtime as needed for anxiety or sleep.   meclizine  (ANTIVERT ) 25 MG tablet  1/2-1 pill up to 3 times daily for motion sickness/dizziness   meloxicam  (MOBIC ) 15 MG tablet TAKE 1 TABLET BY MOUTH DAILY   Multiple Vitamin (MULTIVITAMIN) capsule Take 1 capsule by mouth daily.   omeprazole  (PRILOSEC) 40 MG capsule Take 1 capsule (40 mg total) by mouth daily. TAKE ONE CAPSULE BY MOUTH DAILY TO PREVENT HEARTBURN AND INDIGESTION (Patient taking differently: Take 40 mg by mouth as needed. TAKE ONE CAPSULE BY MOUTH DAILY TO PREVENT HEARTBURN AND INDIGESTION)   vitamin C (ASCORBIC ACID) 500 MG tablet  Take 1,200 mg by mouth daily.   Vitamin D , Ergocalciferol , (DRISDOL ) 1.25 MG (50000 UNIT) CAPS capsule TAKE 1 CAPSULE BY MOUTH 3 TIMES A WEEK ON MONDAY- WEDNESDAY- FRIDAY FOR SEVERE VITAMIN D . DEFICIENCY   No facility-administered encounter medications on file as of 10/25/2023.    Allergies (verified) Levofloxacin, Buspirone , Cefprozil, Codeine, Hydrocodone-acetaminophen , Hydrocodone-acetaminophen , Morphine, Nitrofuran derivatives, Pravastatin, and Lexapro  [escitalopram  oxalate]   History: Past Medical History:  Diagnosis Date   ADD (attention deficit disorder)    Anxiety    Depression    GERD (gastroesophageal reflux disease)    Hyperlipidemia    Hypertension    Hypothyroid    PONV (postoperative nausea and vomiting)    Vitamin D  deficiency    Past Surgical History:  Procedure Laterality Date   BREAST BIOPSY Left 07/09/2019   CESAREAN SECTION     ESOPHAGOGASTRODUODENOSCOPY N/A 09/02/2015   Procedure: ESOPHAGOGASTRODUODENOSCOPY (EGD);  Surgeon: Lynwood Bohr, MD;  Location[facility] ENDOSCOPY;  Service: Endoscopy;  Laterality: N/A;   ESOPHAGOGASTRODUODENOSCOPY N/A 06/05/2022   Procedure: ESOPHAGOGASTRODUODENOSCOPY (EGD);  Surgeon: Burnette Fallow, MD;  Location: [facility];  Service: Gastroenterology;  Laterality: N/A;   FOOT SURGERY Right    FOREIGN BODY REMOVAL N/A 06/05/2022   Procedure: FOREIGN BODY REMOVAL;  Surgeon: Burnette Fallow, MD;  Location: [facility] ENDOSCOPY;  Service: Gastroenterology;  Laterality: N/A;   IM NAILING TIBIA Left 01/09/2001   Dr. Jerrell Mt, [place]   TONSILLECTOMY  2002   Family History  Problem Relation Age of Onset   Breast cancer Mother        7s   Cancer Mother 66       Breast   Heart disease Father    Hypertension Father    Breast cancer Cousin        twice 41, 11   Social History   Socioeconomic History   Marital status: Divorced    Spouse name: Not on file   Number of children: Not on file   Years of education: Not on file   Highest  education level: Bachelor's degree (e.g., BA, AB, BS)  Occupational History   Not on file  Tobacco Use   Smoking status: Never   Smokeless tobacco: Never  Substance and Sexual Activity   Alcohol use: Yes    Alcohol/week: 1.0 standard drink of alcohol    Types: 1 Glasses of wine per week    Comment: occasional   Drug use: No   Sexual activity: Not Currently  Other Topics Concern   Not on file  Social History Narrative   Not on file   Social Drivers of Health   Financial Resource Strain: Low Risk  (10/25/2023)   Overall Financial Resource Strain (CARDIA)    Difficulty of Paying Living Expenses: Not hard at all  Food Insecurity: Patient Declined (10/25/2023)   Hunger Vital Sign    Worried About Running Out of Food in the Last Year: Patient declined    Ran Out of  Food in the Last Year: Patient declined  Transportation Needs: No Transportation Needs (10/25/2023)   PRAPARE - Administrator, Civil Service (Medical): No    Lack of Transportation (Non-Medical): No  Physical Activity: Sufficiently Active (10/25/2023)   Exercise Vital Sign    Days of Exercise per Week: 7 days    Minutes of Exercise per Session: 40 min  Stress: No Stress Concern Present (10/25/2023)   [employer] of Occupational Health - Occupational Stress Questionnaire    Feeling of Stress: Not at all  Recent Concern: Stress - Stress Concern Present (10/23/2023)   [employer] of Occupational Health - Occupational Stress Questionnaire    Feeling of Stress: To some extent  Social Connections: Socially Isolated (10/25/2023)   Social Connection and Isolation Panel    Frequency of Communication with Friends and Family: Never    Frequency of Social Gatherings with Friends and Family: Never    Attends Religious Services: More than 4 times per year    Active Member of [employer] or Organizations: No    Attends Engineer, structural: Never    Marital Status: Divorced    Tobacco Counseling Counseling  given: Not Answered    Clinical Intake:  Pre-visit preparation completed: Yes  Pain : No/denies pain     BMI - recorded: 29.44 Nutritional Status: BMI 25 -29 Overweight Nutritional Risks: None Diabetes: No  Lab Results  Component Value Date   HGBA1C 6.3 06/05/2023   HGBA1C 5.9 (H) 10/18/2022   HGBA1C 5.5 10/15/2021     How often do you need to have someone help you when you read instructions, pamphlets, or other written materials from your doctor or pharmacy?: 1 - Never  Interpreter Needed?: No  Information entered by :: Verdie Saba, CMA   Activities of Daily Living     10/25/2023    9:37 AM 10/22/2023    1:05 AM  In your present state of health, do you have any difficulty performing the following activities:  Hearing? 0 0  Vision? 0 0  Difficulty concentrating or making decisions? 0 0  Walking or climbing stairs? 0 0  Dressing or bathing? 0 0  Doing errands, shopping? 0 0  Preparing Food and eating ? N N  Using the Toilet? N N  In the past six months, have you accidently leaked urine? Y Y  Do you have problems with loss of bowel control? N N  Managing your Medications? N N  Managing your Finances? N N  Housekeeping or managing your Housekeeping? N N    Patient Care Team: Alvia Diana CROME, FNP as PCP - General (Family Medicine) Loni Soyla LABOR, MD as Consulting Physician (Cardiology) [facility] [place] [place] (Ophthalmology)  I have updated your Care Teams any recent Medical Services you may have received from other providers in the past year.     Assessment:   This is a routine wellness examination for Diana Wolfe.  Hearing/Vision screen Hearing Screening - Comments:: Denies hearing difficulties   Vision Screening - Comments:: Wears rx glasses - up to date with routine eye exams with [facility]   Goals Addressed               This Visit's Progress     Patient Stated (pt-stated)        Patient stated she plans to continue to stay  active       Depression Screen     10/25/2023    9:39 AM 10/18/2022  11:38 AM 11/11/2021    1:33 PM 10/15/2020   11:43 AM 05/17/2019   12:07 PM 05/16/2018   11:25 AM 05/02/2017   10:54 AM  PHQ 2/9 Scores  PHQ - 2 Score 0 2 2 6  0 0 0  PHQ- 9 Score 0 11 9 12  0      Fall Risk     10/25/2023    9:38 AM 10/22/2023    1:05 AM 10/18/2022   11:37 AM 10/15/2021   11:46 AM 10/15/2020   11:42 AM  Fall Risk   Falls in the past year? 1 1 0 0 0  Number falls in past yr: 0 0 0 0 0  Comment 1      Injury with Fall? 1 1 0 0 0  Comment right wrist fracture      Risk for fall due to :   No Fall Risks  No Fall Risks  Follow up Falls evaluation completed;Falls prevention discussed  Falls prevention discussed;Falls evaluation completed  Falls evaluation completed;Falls prevention discussed      Data saved with a previous flowsheet row definition    MEDICARE RISK AT HOME:  Medicare Risk at Home Any stairs in or around the home?: Yes If so, are there any without handrails?: No Home free of loose throw rugs in walkways, pet beds, electrical cords, etc?: No Adequate lighting in your home to reduce risk of falls?: Yes Life alert?: No Use of a cane, walker or w/c?: No Grab bars in the bathroom?: No Shower chair or bench in shower?: No Elevated toilet seat or a handicapped toilet?: No  TIMED UP AND GO:  Was the test performed?  No  Cognitive Function: 6CIT completed        10/25/2023    9:42 AM  6CIT Screen  What Year? 0 points  What month? 0 points  What time? 0 points  Count back from 20 0 points  Months in reverse 0 points  Repeat phrase 0 points  Total Score 0 points    Immunizations Immunization History  Administered Date(s) Administered   Fluad Quad(high Dose 65+) 10/28/2018   INFLUENZA, HIGH DOSE SEASONAL PF 11/10/2019, 12/16/2021   Influenza Whole 11/22/2007   Influenza-Unspecified 10/28/2016, 11/11/2020   Moderna SARS-COV2 Booster Vaccination 04/06/2019, 05/04/2019    PFIZER(Purple Top)SARS-COV-2 Vaccination 01/30/2020   Pneumococcal Conjugate-13 05/02/2017   Pneumococcal Polysaccharide-23 10/11/2018   Td 04/25/2008, 05/16/2018   Varicella Zoster Immune Globulin 12/31/2014    Screening Tests Health Maintenance  Topic Date Due   Zoster Vaccines- Shingrix (1 of 2) Never done   COVID-19 Vaccine (2 - Pfizer risk series) 02/20/2020   INFLUENZA VACCINE  09/22/2023   MAMMOGRAM  10/20/2024   Medicare Annual Wellness (AWV)  10/24/2024   DTaP/Tdap/Td (3 - Tdap) 05/15/2028   Colonoscopy  12/01/2029   Pneumococcal Vaccine: 50+ Years  Completed   DEXA SCAN  Completed   Hepatitis C Screening  Completed   HPV VACCINES  Aged Out   Meningococcal B Vaccine  Aged Out    Health Maintenance  Health Maintenance Due  Topic Date Due   Zoster Vaccines- Shingrix (1 of 2) Never done   COVID-19 Vaccine (2 - Pfizer risk series) 02/20/2020   INFLUENZA VACCINE  09/22/2023   Health Maintenance Items Addressed:  10/25/2023  Additional Screening:  Vision Screening: Recommended annual ophthalmology exams for early detection of glaucoma and other disorders of the eye. Would you like a referral to an eye doctor? No    Dental  Screening: Recommended annual dental exams for proper oral hygiene  Community Resource Referral / Chronic Care Management: CRR required this visit?  No   CCM required this visit?  No   Plan:    I have personally reviewed and noted the following in the patient's chart:   Medical and social history Use of alcohol, tobacco or illicit drugs  Current medications and supplements including opioid prescriptions. Patient is not currently taking opioid prescriptions. Functional ability and status Nutritional status Physical activity Advanced directives List of other physicians Hospitalizations, surgeries, and ER visits in previous 12 months Vitals Screenings to include cognitive, depression, and falls Referrals and appointments  In addition, I  have reviewed and discussed with patient certain preventive protocols, quality metrics, and best practice recommendations. A written personalized care plan for preventive services as well as general preventive health recommendations were provided to patient.   Verdie CHRISTELLA Saba, CMA   10/25/2023   After Visit Summary: (In Person-Declined) Patient declined AVS at this time.  Notes: Patient will schedule a 1-yr Physical w/PCP prior to leaving the office today.

## 2023-10-26 DIAGNOSIS — H40013 Open angle with borderline findings, low risk, bilateral: Secondary | ICD-10-CM | POA: Diagnosis not present

## 2023-10-27 ENCOUNTER — Encounter: Payer: Self-pay | Admitting: Physical Therapy

## 2023-10-27 ENCOUNTER — Ambulatory Visit: Attending: Family Medicine | Admitting: Physical Therapy

## 2023-10-27 DIAGNOSIS — R252 Cramp and spasm: Secondary | ICD-10-CM | POA: Insufficient documentation

## 2023-10-27 DIAGNOSIS — R102 Pelvic and perineal pain: Secondary | ICD-10-CM | POA: Diagnosis not present

## 2023-10-27 DIAGNOSIS — R278 Other lack of coordination: Secondary | ICD-10-CM | POA: Diagnosis not present

## 2023-10-27 NOTE — Therapy (Signed)
 OUTPATIENT PHYSICAL THERAPY FEMALE PELVIC TREATMENT   Patient Name: Diana Wolfe MRN: [id number] DOB:Jan 25, 1951, 73 y.o., female Today's Date: 10/27/2023  Progress Note Reporting Period 05/25/23 to 10/27/23  See note below for Objective Data and Assessment of Progress/Goals.      END OF SESSION:  PT End of Session - 10/27/23 0931     Visit Number 10    Date for PT Re-Evaluation 02/01/24    Authorization Type Medicare/ [employer]    Authorization - Visit Number 10    Authorization - Number of Visits 10    PT Start Time 0930    PT Stop Time 1010    PT Time Calculation (min) 40 min    Activity Tolerance Patient tolerated treatment well    Behavior During Therapy WFL for tasks assessed/performed          Past Medical History:  Diagnosis Date   ADD (attention deficit disorder)    Anxiety    Depression    GERD (gastroesophageal reflux disease)    Hyperlipidemia    Hypertension    Hypothyroid    PONV (postoperative nausea and vomiting)    Vitamin D  deficiency    Past Surgical History:  Procedure Laterality Date   BREAST BIOPSY Left 07/09/2019   CESAREAN SECTION     ESOPHAGOGASTRODUODENOSCOPY N/A 09/02/2015   Procedure: ESOPHAGOGASTRODUODENOSCOPY (EGD);  Surgeon: Lynwood Bohr, MD;  Location: [facility] ENDOSCOPY;  Service: Endoscopy;  Laterality: N/A;   ESOPHAGOGASTRODUODENOSCOPY N/A 06/05/2022   Procedure: ESOPHAGOGASTRODUODENOSCOPY (EGD);  Surgeon: Burnette Fallow, MD;  Location: [facility];  Service: Gastroenterology;  Laterality: N/A;   FOOT SURGERY Right    FOREIGN BODY REMOVAL N/A 06/05/2022   Procedure: FOREIGN BODY REMOVAL;  Surgeon: Burnette Fallow, MD;  Location: [facility] ENDOSCOPY;  Service: Gastroenterology;  Laterality: N/A;   IM NAILING TIBIA Left 01/09/2001   Dr. 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   Dysphagia 04/21/2020   Generalized abdominal pain 04/21/2020   Hematochezia 04/21/2020   Left lower quadrant pain 04/21/2020   History of colonic polyps 04/21/2020   Urticaria 04/21/2020   B12 deficiency 01/31/2019   Medication management 06/23/2014   Vaginal atrophy 03/27/2014   Hyperlipidemia    Hypertension    Anxiety    GERD (gastroesophageal reflux disease)    ADD (attention deficit disorder)    Depression    Vitamin D  deficiency, unspecified    Hypothyroidism 04/25/2008   Iron deficiency anemia 04/25/2008   Obstructive sleep apnea 11/16/2007   Allergic rhinitis 11/16/2007   Headache 11/16/2007   PULMONARY EMBOLISM, HX OF 11/16/2007    PCP: Alvia Corean CROME, FNP  REFERRING PROVIDER: Alvia Corean CROME, FNP   REFERRING DIAG:  [phone] (ICD-10-CM) - Medication management  M62.89 (ICD-10-CM) - Pelvic floor dysfunction in female    THERAPY DIAG:  Cramp and spasm - Plan: PT plan of care cert/re-cert  Other lack of coordination - Plan: PT plan of care cert/re-cert  Pelvic pain - Plan: PT plan of care cert/re-cert  Rationale for Evaluation and Treatment: Rehabilitation  ONSET DATE: 2021  SUBJECTIVE:  SUBJECTIVE STATEMENT: I have used the next size trainer and it was not painful.    PAIN:  Are you having pain? Yes NPRS scale: 7/10 Pain location: Vaginal  Pain type: aching Pain description: intermittent   Aggravating factors: during vaginal penetration Relieving factors: no vaginal penetration  PRECAUTIONS: None  RED FLAGS: None   WEIGHT BEARING RESTRICTIONS: No  FALLS:  Has patient fallen in last 6 months? Yes. Number of falls 1 time slipped on ice.   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  URINATION: Pain with urination: No Fully empty bladder: Yes: sometimes will empty her bladder and 10 minutes have to urinate again Stream: Strong Urgency: Yes , getting out of car, sitting in house 08/07/23: feels the urgency after driving more than 1 hour; no urgency with sitting in house.  Frequency: night time 2-3 times: daytime: 6 times 07/31/23: wake up 2 times at night 08/07/23: wake up 1 time to urinate.  Leakage: Urge to void and Walking to the bathroom 08/07/23: urinary leakage 1 time since last week when she got out of the car and rushed to the bathroom Pads: No  INTERCOURSE:not sexually active at this time but in the future would like to be able to.    PREGNANCY: C-section deliveries 1  PROLAPSE: None   OBJECTIVE:  Note: Objective measures were completed at Evaluation unless otherwise noted.  DIAGNOSTIC FINDINGS:  none  PATIENT SURVEYS:  UIQ-7: 48 PFIQ-7: 44 08/16/23: UIQ-7: 33 PFIQ-7: 28 10/27/23: UIQ-7: 48 PFIQ-7: 44  COGNITION: Overall cognitive status: Within functional limits for tasks assessed     SENSATION: Light touch: Appears intact   POSTURE: decreased lumbar lordosis   LUMBARAROM/PROM:  A/PROM A/PROM  eval A/PROM  10/27/23  Extension Decreased by 50% full   (Blank rows = not tested)  LOWER EXTREMITY ROM: bilateral hip ROM is full   LOWER EXTREMITY MMT:  MMT Right eval Left eval Right 10/27/23 Left 10/27/23  Hip extension 4/5 4/5 5/5 5/5  Hip abduction 4/5 5/5 5/5 5/5   (Blank rows = not tested) PALPATION:    Pelvic Alignment: ASIS  Abdominal: decreased mobility of the tissue below the scar, decreased movement of the diaphragm, difficulty with  opening her lower rib cage                External Perineal Exam: dry, redness along the vulva, white patch of skin along the inner left labia majora; Q-tip test was positive at 6, 11 and 1 O'Clock.                              Internal Pelvic Floor: pain with Q-tip therefore did not assess any further, when she bulged therapist did not see any prolapse, the vaginal canal was small  Patient confirms identification and approves PT to assess internal pelvic floor and treatment Yes  PELVIC MMT:not tested due to the pain   MMT 07/31/23  Vaginal 2/5  Internal Anal Sphincter   External Anal Sphincter   Puborectalis   Diastasis Recti   (Blank rows = not tested)         TODAY'S TREATMENT:  10/27/23 Manual: Soft tissue mobilization: Scar tissue mobilization: Using  the suction cup around the scar to stretch the scar tissue and elongate the fiber.  Pulling up the scar to reduce the adherence of the scar Fascial release around the scar Myofascial release: Fascial release around the left and right side of the abdomen to work on tissue around the ureter Exercises: Stretches/mobility: Lumbar extension Strengthening: Muscle testing for strength    10/18/23 Manual: Myofascial release: Fascial release to the urogenital diaphragm and perineal body to elongate  Internal pelvic floor techniques: No emotional/communication barriers or cognitive limitation. Patient is motivated to learn. Patient understands and agrees with treatment goals and plan. PT explains patient will be examined in standing, sitting, and lying down to see how their muscles and joints work. When they are ready, they will be asked to remove their underwear so PT can examine their perineum. The patient is also given the option of providing their own chaperone as one is not provided in our facility. The patient also has the right and is explained the right to defer or refuse any part of the evaluation or treatment including the  internal exam. With the patient's consent, PT will use one gloved finger to gently assess the muscles of the pelvic floor, seeing how well it contracts and relaxes and if there is muscle symmetry. After, the patient will get dressed and PT and patient will discuss exam findings and plan of care. PT and patient discuss plan of care, schedule, attendance policy and HEP activities.  Therapist gloved finger in the vaginal canal working on the left levator ani, obturator internist Therapist gloved finger in the vaginal canal working on the left side of the bladder with the other hand on the outside of the bladder to increase mobility Exercises: Stretches/mobility: Sitting piriformis stretch holding 30 sec Self-care: Educated patient on how to progress to the next size dilator since she is not having pain with the smaller one.     10/09/23 Exercises: Stretches/mobility: Sitting piriformis stretch holding 30 sec Sitting keeping knees together moving feet apart to stretch the pelvic floor Strengthening: Supine alternate hip and shoulder flexion 20 x  Supine bridge with bilateral shoulder shoulder extension 15 x  Standing move red band toward opposite hand to engage the core Standing chop with red band 15 x each way Standing pull the red band into shoulder extension and alternate hip flexion 15 x each  Plank on wall lifting opposite arm and leg 15 x each Self-care: Discussed with patient on her using the dilators, how to progress.       PATIENT EDUCATION:  09/15/23 Education details: Access Code: [email or web address], abdominal scar massage Person educated: Patient Education method: Explanation, Demonstration, Tactile cues, Verbal cues, and Handouts Education comprehension: verbalized understanding, returned demonstration, verbal cues required, tactile cues required, and needs further education  HOME EXERCISE PROGRAM: 09/15/23 Access Code: [email or web address] URL: [email or web address] Date:  09/15/2023 Prepared by: Channing Pereyra  Program Notes sit on ball and massage the pelvic floor daily for several minutes.   Exercises - Seated Diaphragmatic Breathing  - 1 x daily - 7 x weekly - 3 sets - 10 reps - Seated Piriformis Stretch with Trunk Bend  - 1 x daily - 7 x weekly - 1 sets - 2 reps - 30 sec hold - Seated Happy Baby With Trunk Flexion For Pelvic Relaxation  - 1 x daily - 7 x weekly - 1 sets - 1 reps - 30 sec hold - Supine Piriformis Stretch with Leg Straight  - 1 x daily - 7  x weekly - 1 sets - 2 reps - 30 sec hold - Hooklying Transversus Abdominis Palpation  - 1 x daily - 3 x weekly - 1 sets - 10 reps - Hooklying Isometric Hip Flexion  - 1 x daily - 3 x weekly - 1 sets - 10 reps - Quadruped Pelvic Floor Contraction with Opposite Arm and Leg Lift  - 1 x daily - 1 x weekly - 2 sets - 10 reps - Posterior Chain Stretch  - 1 x daily - 3 x weekly - 1 sets - 5 reps - Butterfly Groin Stretch  - 1 x daily - 3 x weekly - 1 sets - 1 reps - 30 sec hold - Quadruped Yoga Block Lift Off  - 1 x daily - 3 x weekly - 1 sets - 10 reps   ASSESSMENT:  CLINICAL IMPRESSION: Patient is a 73 y.o. female who was seen today for physical therapy  treatment for pelvis floor dysfunction.  Patient is using the second dilator. No pain with the second dilator. Patient has leaked 1 time in the past few weeks after being in the car.   Patient had a drop of urine with the urge to void. Patient is using the urge technique and getting the hand of it.  Patient has to stop every 2 hours to urinate when on a long drive. Increase in bilateral hip strength. Patient has full lumbar ROM. She has improved mobility of the scar. She has fascial restrictions along the lower abdomen. Patient will benefit from skilled therapy to improve pelvic floor tissue health, elongation of the vaginal canal and working on coordination to reduce urgency.   OBJECTIVE IMPAIRMENTS: decreased coordination, decreased ROM, decreased strength,  increased fascial restrictions, increased muscle spasms, impaired tone, and pain.   ACTIVITY LIMITATIONS: continence, toileting, and locomotion level  PARTICIPATION LIMITATIONS: interpersonal relationship, driving, shopping, and community activity  PERSONAL FACTORS: Fitness, Time since onset of injury/illness/exacerbation, and 1-2 comorbidities: Hypothyroid; Hypertension; Cesarean section are also affecting patient's functional outcome.   REHAB POTENTIAL: Good  CLINICAL DECISION MAKING: Evolving/moderate complexity  EVALUATION COMPLEXITY: Moderate   GOALS: Goals reviewed with patient? Yes  SHORT TERM GOALS: Target date: 06/22/23  Patient educated on vaginal moisturizers and understands how they are used and why to improve the health of the vaginal tissue.  Baseline: Goal status: Met 05/29/23  2.  Patient is able to perform diaphragmatic breathing to elongate her pelvic floor.  Baseline:  Goal status: met 07/24/23  3.  Patient understands was to perform perineal massage externally to start to relax the pelvic floor tissue.  Baseline:  Goal status: Met 07/31/23  4.  Patient is independent with flexibility exercises to indirectly elongate the pelvic floor.  Baseline:  Goal status: Met 07/24/23  5.  Patient educated on vaginal dilators to assist with the expansion of the pelvic floor.  Baseline:  Goal status: met 08/16/23  LONG TERM GOALS: Target date: 01/27/24  Patient independent with advanced HEP to increase strength of core and pelvic floor.  Baseline:  Goal status: ongoing 10/27/23  2.  Patient is able to use the dilator that is appropriate for her goal to have vaginal penetration with pain level </= 0-2/10.  Baseline: Patient is on the 2nd one Goal status: ongoing 10/27/23  3.  Patient pelvic floor strength increased to reduce urinary leakage with urgency.  Baseline:  Goal status: ongoing 10/27/23  4.  Patient is able to use the behavioral technique to reduce the urge to void  so  she is able to slowly walk to the bathroom without leaking urine and enter her house.  Baseline:  Goal status: ongoing 10/27/23  5.  Patient reports she is able to urinate 1-2 times at night due to using the urge to void technique.  Baseline:  Goal status: Met 08/07/23   PLAN:  PT FREQUENCY: 1-2x/week  PT DURATION: 12 weeks  PLANNED INTERVENTIONS: 97110-Therapeutic exercises, 97530- Therapeutic activity, 97112- Neuromuscular re-education, 97535- Self Care, 02859- Manual therapy, G0283- Electrical stimulation (unattended), 97035- Ultrasound, Patient/Family education, Dry Needling, Joint mobilization, Spinal mobilization, Scar mobilization, Cryotherapy, Moist heat, and Biofeedback  PLAN FOR NEXT SESSION:  internal work to pelvic floor  on the right, work on [employer] joint mobility, hip mobility and lumbar mobiltiy   Channing Pereyra, PT 10/27/23 10:43 AM

## 2023-11-07 DIAGNOSIS — Z23 Encounter for immunization: Secondary | ICD-10-CM | POA: Diagnosis not present

## 2023-11-08 NOTE — Progress Notes (Signed)
 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/08/23- AHI 21.7/hr, desat to 68%, body weight 156 lbs ===================================================================================================================   05/08/23- 73 yoF never smoker followed for OSA, complicated by Insomnia, HTN, Allergic Rhinitis, GERD, Diverticulitis, Hypothyroid, Urticaria, hx Pulmonary Embolism, ADD, Depression,  Works as Designer, multimedia -Adderall 20, Requip 4 mg, Symbicort preferred over Trelegy CPAP auto 5-15/ [employer]    new order 02/07/23 Download compliance 83%, AHI 1.5/hr Body weight today 155 lbs Discussed the use of AI scribe software for clinical note transcription with the patient, who gave verbal consent to proceed. History of Present Illness   The patient, with a history of sleep apnea, presents with discomfort from her CPAP mask. She reports that the mask 'grabs my hair and pulls it,' leading to frequent awakenings to adjust the mask. She also describes the mask as 'hot.' Despite these issues, the patient acknowledges the health benefits of the CPAP machine, which is set to a pressure range of 5-15 cm H2O and spends most of its time between 9-11 cm H2O. The patient's apnea control is good, with less than two apneas per hour.  The patient has tried different mask styles, including a 'mustache mask' and a mask that covers the nose. The 'mustache mask' was not secure and frequently came off, while the nose-covering mask caused discomfort by blowing air into the patient's eyes.   Assessment and Plan:    Obstructive Sleep Apnea (OSA) CPAP effective at 9-11 cm H2O with <2 apneas/hour. Discomfort with mask affects sleep quality. - Order home care company to refit CPAP mask for comfort and seal. - Adjust CPAP pressure settings to reduce leaks and improve  comfort. - Instruct home care company to review options for adjusting humidifier or perceived air temperature.      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 Discussed the use of AI scribe software for clinical note transcription with the patient, who gave verbal consent to proceed.  History of Present Illness   LEILANNY FLUITT is a 73 year old female who presents with discomfort using CPAP therapy.  She experiences ongoing discomfort with CPAP therapy and has tried several different masks. The current mask is an improvement as it does not blow air into her eyes.  She previously used a fitted oral appliance for sleep apnea, which caused jaw joint pain.  There are no changes in her breathing.     Assessment and Plan:    Obstructive sleep apnea Obstructive sleep apnea with CPAP intolerance. Current mask more comfortable but not ideal. Oral appliances previously caused jaw discomfort and were costly. Discussed fitted oral appliances and Inspire implant. Elizabeth lacks long-term data but is increasingly used. - Can refer to sleep-certified dentist for fitted oral appliance evaluation if she chooses. - Discuss Inspire surgical implant and nerve stimulator with specialist.     ROS-see HPI   + = positive Constitutional:    weight loss, night sweats, fevers, chills, fatigue, lassitude. HEENT:    headaches, + difficulty swallowing, tooth/dental problems, sore throat,  sneezing, itching, +ear ache, nasal congestion, post nasal drip, snoring CV:    chest pain, orthopnea, PND, swelling in lower extremities, anasarca, dizziness,  palpitations Resp:   +shortness of breath with exertion or at rest.                productive cough,   non-productive cough, coughing up of blood.              change in color of mucus.  wheezing.   Skin:    rash or lesions. GI:   heartburn,+ indigestion, abdominal pain, nausea, vomiting, diarrhea,                 change in bowel habits, loss of appetite GU: dysuria, change in color of urine, no urgency or frequency.   flank pain. MS:  + joint pain, stiffness, decreased range of motion, back pain. Neuro-     nothing unusual Psych:  change in mood or affect.  depression or +anxiety.   memory loss.  OBJ- Physical Exam General- Alert, Oriented, Affect-appropriate, Distress- none acute Skin- rash-none, lesions- none, excoriation- none Lymphadenopathy- none Head- atraumatic            Eyes- Gross vision intact, PERRLA, conjunctivae and secretions clear            Ears- Hearing, canals-normal            Nose- Clear, no-Septal dev, mucus, polyps, erosion, perforation             Throat- Mallampati II/ +UPPP , mucosa clear , drainage- none, tonsils- atrophic Neck- flexible , trachea midline, no stridor , thyroid  nl, carotid no bruit Chest - symmetrical excursion , unlabored           Heart/CV- RRR , no murmur , no gallop  , no rub, nl s1 s2                           - JVD- none , edema- none, stasis changes- none, varices- none           Lung- clear to P&A, wheeze- none, cough- none , dullness-none, rub- none           Chest wall-  Abd-  Br/ Gen/ Rectal- Not done, not indicated Extrem- cyanosis- none, clubbing, none, atrophy- none, strength- nl Neuro- grossly intact to observation

## 2023-11-09 ENCOUNTER — Encounter: Payer: Self-pay | Admitting: Internal Medicine

## 2023-11-09 ENCOUNTER — Ambulatory Visit: Admitting: Internal Medicine

## 2023-11-09 VITALS — BP 130/82 | HR 79 | Temp 98.0°F | Ht 62.0 in | Wt 155.2 lb

## 2023-11-09 DIAGNOSIS — G4733 Obstructive sleep apnea (adult) (pediatric): Secondary | ICD-10-CM

## 2023-11-09 NOTE — Patient Instructions (Signed)
 Order- referral to  [facility] ENT Dr Okey, consider Inspire for OSA  We can continue CPAP auto 5-15 or make changes as you wish.

## 2023-11-10 ENCOUNTER — Other Ambulatory Visit: Payer: Self-pay | Admitting: Family Medicine

## 2023-11-11 ENCOUNTER — Encounter: Payer: Self-pay | Admitting: Internal Medicine

## 2023-11-13 NOTE — Telephone Encounter (Signed)
 Copied from CRM [id number]. Topic: Clinical - Medication Question >> Nov 13, 2023 11:06 AM Revonda D wrote: Reason for CRM: Pt is returning a missed call from the office regarding a medication. Pt stated that she is not taking diclofenac orally and would like to have the meloxicam  refilled.

## 2023-11-15 DIAGNOSIS — M25572 Pain in left ankle and joints of left foot: Secondary | ICD-10-CM | POA: Diagnosis not present

## 2023-11-16 ENCOUNTER — Encounter: Payer: Self-pay | Admitting: Family Medicine

## 2023-11-16 DIAGNOSIS — Z860100 Personal history of colon polyps, unspecified: Secondary | ICD-10-CM | POA: Diagnosis not present

## 2023-11-16 DIAGNOSIS — R1310 Dysphagia, unspecified: Secondary | ICD-10-CM | POA: Diagnosis not present

## 2023-11-19 NOTE — Progress Notes (Signed)
 Acute Office Visit  Subjective:     Patient ID: Diana Wolfe, female    DOB: 25-Apr-1950, 73 y.o.   MRN: [id number]  Chief Complaint  Patient presents with   Rash    Right side, present for about 6 months. Feels it could be skin cancer    HPI  Discussed the use of AI scribe software for clinical note transcription with the patient, who gave verbal consent to proceed.  History of Present Illness Diana Wolfe is a 73 year old female who presents with concerns about a skin lesion, nail changes, and foot pain.  Facial skin lesion - Scabby lesion present on the face - Awaiting dermatology appointment for further evaluation  Nail dystrophy - Fingernails, especially the thumbs, exhibit ridges and splitting - Nails are dry and painful when pressed - No improvement with biotin or other supplements - History of thyroid  disease, on thyroid  medication for thirty years (last checked April 2025)  Left foot pain - Pain in the left foot without known injury - X-ray at urgent care showed no fractures - Known bone spur on the foot, unclear if related to current pain - Currently on a mild dose of prednisone  - Uses topical arthritis cream with some relief  Blood pressure fluctuations - Blood pressure fluctuates, 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 Loni regarding blood pressure regulation  Long covid symptoms - Long COVID affecting blood pressure regulation  Esophageal symptoms and methotrexate use - Takes methotrexate as needed for esophageal issues - Discussed bone density scan with gastroenterologist due to long-term methotrexate use - Last bone density scan in 2020 showed osteopenia     ROS Per HPI      Objective:    BP (!) 170/100 (BP Location: Left Arm, Patient Position: Sitting)    Pulse 85   Temp 98.7 F (37.1 C) (Temporal)   Ht 5' 2 (1.575 m)   Wt 155 lb (70.3 kg)   SpO2 97%   BMI 28.35 kg/m    Physical Exam Vitals and nursing note reviewed.  Constitutional:      General: She is not in acute distress.    Appearance: Normal appearance. She is normal weight.  HENT:     Head: Normocephalic and atraumatic.     Right Ear: External ear normal.     Left Ear: External ear normal.     Nose: Nose normal.     Mouth/Throat:     Mouth: Mucous membranes are moist.     Pharynx: Oropharynx is clear.  Eyes:     Extraocular Movements: Extraocular movements intact.     Pupils: Pupils are equal, round, and reactive to light.  Cardiovascular:     Rate and Rhythm: Normal rate and regular rhythm.     Pulses: Normal pulses.     Heart sounds: Normal heart sounds.  Pulmonary:     Effort: Pulmonary effort is normal. No respiratory distress.     Breath sounds: Normal breath sounds. No wheezing, rhonchi or rales.  Musculoskeletal:        General: Normal range of motion.     Cervical back: Normal range of motion.     Right lower leg: No edema.     Left lower leg: No edema.  Lymphadenopathy:     Cervical: No cervical adenopathy.  Skin:  Comments: See photo of Right cheek  Neurological:     General: No focal deficit present.     Mental Status: She is alert and oriented to person, place, and time.  Psychiatric:        Mood and Affect: Mood normal.        Thought Content: Thought content normal.     Results for orders placed or performed in visit on 11/20/23  CBC w/Diff  Result Value Ref Range   WBC 16.3 (H) 4.0 - 10.5 K/uL   RBC 5.36 (H) 3.87 - 5.11 Mil/uL   Hemoglobin 15.4 (H) 12.0 - 15.0 g/dL   HCT 52.6 (H) 63.9 - 53.9 %   MCV 88.2 78.0 - 100.0 fl   MCHC 32.6 30.0 - 36.0 g/dL   RDW 85.8 88.4 - 84.4 %   Platelets 464.0 (H) 150.0 - 400.0 K/uL   Neutrophils Relative % 78.5 (H) 43.0 - 77.0 %   Lymphocytes Relative 13.7 12.0 - 46.0 %   Monocytes Relative 7.1  3.0 - 12.0 %   Eosinophils Relative 0.3 0.0 - 5.0 %   Basophils Relative 0.4 0.0 - 3.0 %   Neutro Abs 12.8 (H) 1.4 - 7.7 K/uL   Lymphs Abs 2.2 0.7 - 4.0 K/uL   Monocytes Absolute 1.2 (H) 0.1 - 1.0 K/uL   Eosinophils Absolute 0.0 0.0 - 0.7 K/uL   Basophils Absolute 0.1 0.0 - 0.1 K/uL  Comp Met (CMET)  Result Value Ref Range   Sodium 138 135 - 145 mEq/L   Potassium 4.2 3.5 - 5.1 mEq/L   Chloride 101 96 - 112 mEq/L   CO2 30 19 - 32 mEq/L   Glucose, Bld 104 (H) 70 - 99 mg/dL   BUN 16 6 - 23 mg/dL   Creatinine, Ser 9.21 0.40 - 1.20 mg/dL   Total Bilirubin 0.5 0.2 - 1.2 mg/dL   Alkaline Phosphatase 83 39 - 117 U/L   AST 15 0 - 37 U/L   ALT 20 0 - 35 U/L   Total Protein 7.2 6.0 - 8.3 g/dL   Albumin 4.6 3.5 - 5.2 g/dL   GFR 23.68 >39.99 mL/min   Calcium  10.0 8.4 - 10.5 mg/dL  Vitamin D  (25 hydroxy)  Result Value Ref Range   VITD 56.39 30.00 - 100.00 ng/mL  TSH  Result Value Ref Range   TSH 0.44 0.35 - 5.50 uIU/mL  Vitamin B12  Result Value Ref Range   Vitamin B-12 >1500 (H) 211 - 911 pg/mL        Assessment & Plan:   Assessment and Plan Assessment & Plan  Facial Lesion Suspected precancerous skin lesion of face Lesion appears scabby and possibly precancerous. Dermatology expertise preferred for definitive diagnosis. - Refer to plastic surgery for evaluation and possible biopsy.  Nail dystrophy Ridges and splitting in fingernails, possibly due to nutritional deficiency or thyroid  dysfunction. - Check thyroid  function and iron levels. - Consider biotin and zinc supplementation.  Left foot pain  Pain possibly related to inflammation or nerve involvement. Bone spur present but not likely causing symptoms. Improvement with prednisone  and arthritis cream noted. - Use arthritis cream for relief. - Obtain x-ray results from urgent care.  Primary hypertension Blood pressure fluctuates, possibly related to previous COVID-19 infection and vaccination. Managed with hydralazine   for systolic BP >190, though not needed recently.  Acquired Hypothyroidism Long-standing hypothyroidism managed with levothyroxine . - Check thyroid  function levels.  Vitamin D  deficiency Previously on vitamin D2, switched to D3 for better  absorption. - Check vitamin D  levels. - Consider higher dose vitamin D3 if needed.  Osteopenia L femur, postmenopausal estrogen deficiency Last bone density scan in 2020 showed no osteoporosis. Discussed need for updated scan due to long-term methotrexate use. - Order bone density scan.  Esophageal stricture  Long-term omeprazole  use for esophageal stricture. Previous discontinuation led to emergency dilation. - Continue omeprazole  as needed for symptom control.  Iron Deficiency Anemia - CBC, B12 today    Orders Placed This Encounter  Procedures   DG Bone Density    Standing Status:   Future    Expiration Date:   11/19/2024    Reason for Exam (SYMPTOM  OR DIAGNOSIS REQUIRED):   long term omeprazole  use, post menopausal estrogen deficiency    Preferred imaging location?:   [address]   CBC w/Diff   Comp Met (CMET)   Vitamin D  (25 hydroxy)   TSH   Vitamin B12   Ambulatory referral to Plastic Surgery    Referral Priority:   Routine    Referral Type:   Surgical    Referral Reason:   Specialty Services Required    Requested Specialty:   Plastic Surgery    Number of Visits Requested:   1     No orders of the defined types were placed in this encounter.   Return in about 6 months (around 05/19/2024) for cpe.  Corean LITTIE Ku, FNP

## 2023-11-20 ENCOUNTER — Encounter: Payer: Self-pay | Admitting: Family Medicine

## 2023-11-20 ENCOUNTER — Ambulatory Visit (INDEPENDENT_AMBULATORY_CARE_PROVIDER_SITE_OTHER): Admitting: Family Medicine

## 2023-11-20 VITALS — BP 170/100 | HR 85 | Temp 98.7°F | Ht 62.0 in | Wt 155.0 lb

## 2023-11-20 DIAGNOSIS — I10 Essential (primary) hypertension: Secondary | ICD-10-CM

## 2023-11-20 DIAGNOSIS — D509 Iron deficiency anemia, unspecified: Secondary | ICD-10-CM

## 2023-11-20 DIAGNOSIS — E039 Hypothyroidism, unspecified: Secondary | ICD-10-CM | POA: Diagnosis not present

## 2023-11-20 DIAGNOSIS — M79672 Pain in left foot: Secondary | ICD-10-CM

## 2023-11-20 DIAGNOSIS — M85852 Other specified disorders of bone density and structure, left thigh: Secondary | ICD-10-CM | POA: Diagnosis not present

## 2023-11-20 DIAGNOSIS — E559 Vitamin D deficiency, unspecified: Secondary | ICD-10-CM | POA: Diagnosis not present

## 2023-11-20 DIAGNOSIS — L603 Nail dystrophy: Secondary | ICD-10-CM | POA: Diagnosis not present

## 2023-11-20 DIAGNOSIS — L989 Disorder of the skin and subcutaneous tissue, unspecified: Secondary | ICD-10-CM

## 2023-11-20 DIAGNOSIS — Z780 Asymptomatic menopausal state: Secondary | ICD-10-CM | POA: Diagnosis not present

## 2023-11-20 LAB — CBC WITH DIFFERENTIAL/PLATELET
Basophils Absolute: 0.1 K/uL (ref 0.0–0.1)
Basophils Relative: 0.4 % (ref 0.0–3.0)
Eosinophils Absolute: 0 K/uL (ref 0.0–0.7)
Eosinophils Relative: 0.3 % (ref 0.0–5.0)
HCT: 47.3 % — ABNORMAL HIGH (ref 36.0–46.0)
Hemoglobin: 15.4 g/dL — ABNORMAL HIGH (ref 12.0–15.0)
Lymphocytes Relative: 13.7 % (ref 12.0–46.0)
Lymphs Abs: 2.2 K/uL (ref 0.7–4.0)
MCHC: 32.6 g/dL (ref 30.0–36.0)
MCV: 88.2 fl (ref 78.0–100.0)
Monocytes Absolute: 1.2 K/uL — ABNORMAL HIGH (ref 0.1–1.0)
Monocytes Relative: 7.1 % (ref 3.0–12.0)
Neutro Abs: 12.8 K/uL — ABNORMAL HIGH (ref 1.4–7.7)
Neutrophils Relative %: 78.5 % — ABNORMAL HIGH (ref 43.0–77.0)
Platelets: 464 K/uL — ABNORMAL HIGH (ref 150.0–400.0)
RBC: 5.36 Mil/uL — ABNORMAL HIGH (ref 3.87–5.11)
RDW: 14.1 % (ref 11.5–15.5)
WBC: 16.3 K/uL — ABNORMAL HIGH (ref 4.0–10.5)

## 2023-11-20 LAB — COMPREHENSIVE METABOLIC PANEL WITH GFR
ALT: 20 U/L (ref 0–35)
AST: 15 U/L (ref 0–37)
Albumin: 4.6 g/dL (ref 3.5–5.2)
Alkaline Phosphatase: 83 U/L (ref 39–117)
BUN: 16 mg/dL (ref 6–23)
CO2: 30 meq/L (ref 19–32)
Calcium: 10 mg/dL (ref 8.4–10.5)
Chloride: 101 meq/L (ref 96–112)
Creatinine, Ser: 0.78 mg/dL (ref 0.40–1.20)
GFR: 76.31 mL/min (ref >60.00)
Glucose, Bld: 104 mg/dL — ABNORMAL HIGH (ref 70–99)
Potassium: 4.2 meq/L (ref 3.5–5.1)
Sodium: 138 meq/L (ref 135–145)
Total Bilirubin: 0.5 mg/dL (ref 0.2–1.2)
Total Protein: 7.2 g/dL (ref 6.0–8.3)

## 2023-11-20 NOTE — Patient Instructions (Addendum)
 I have sent in a referral for you to plastic surgery to have the lesion on your face further evaluated. Someone should be reaching out to get you scheduled.   I have ordered a bone density scan for you today. Someone will be reaching out to get you scheduled.   We are checking labs today, will be in contact with any results that require further attention  Follow-up with me in 6 mos for medication management, sooner if needed.

## 2023-11-21 ENCOUNTER — Ambulatory Visit: Payer: Self-pay | Admitting: Family Medicine

## 2023-11-21 DIAGNOSIS — E559 Vitamin D deficiency, unspecified: Secondary | ICD-10-CM

## 2023-11-21 LAB — TSH: TSH: 0.44 u[IU]/mL (ref 0.35–5.50)

## 2023-11-21 LAB — VITAMIN D 25 HYDROXY (VIT D DEFICIENCY, FRACTURES): VITD: 56.39 ng/mL (ref 30.00–100.00)

## 2023-11-21 LAB — VITAMIN B12: Vitamin B-12: >1500 pg/mL — ABNORMAL HIGH (ref 211–911)

## 2023-11-23 DIAGNOSIS — H40003 Preglaucoma, unspecified, bilateral: Secondary | ICD-10-CM | POA: Diagnosis not present

## 2023-11-26 DIAGNOSIS — M85852 Other specified disorders of bone density and structure, left thigh: Secondary | ICD-10-CM | POA: Insufficient documentation

## 2023-11-26 DIAGNOSIS — M79672 Pain in left foot: Secondary | ICD-10-CM | POA: Insufficient documentation

## 2023-11-26 DIAGNOSIS — Z780 Asymptomatic menopausal state: Secondary | ICD-10-CM | POA: Insufficient documentation

## 2023-11-26 DIAGNOSIS — L989 Disorder of the skin and subcutaneous tissue, unspecified: Secondary | ICD-10-CM | POA: Insufficient documentation

## 2023-11-26 DIAGNOSIS — L603 Nail dystrophy: Secondary | ICD-10-CM | POA: Insufficient documentation

## 2023-11-26 MED ORDER — VITAMIN D3 10 MCG (400 UNIT) PO CAPS: 2.0000 | ORAL_CAPSULE | Freq: Every day | ORAL | 1 refills | Status: DC

## 2023-11-27 ENCOUNTER — Ambulatory Visit: Attending: Family Medicine | Admitting: Physical Therapy

## 2023-11-27 ENCOUNTER — Encounter: Payer: Self-pay | Admitting: Physical Therapy

## 2023-11-27 DIAGNOSIS — R278 Other lack of coordination: Secondary | ICD-10-CM | POA: Insufficient documentation

## 2023-11-27 DIAGNOSIS — R252 Cramp and spasm: Secondary | ICD-10-CM | POA: Diagnosis not present

## 2023-11-27 DIAGNOSIS — R102 Pelvic and perineal pain unspecified side: Secondary | ICD-10-CM | POA: Insufficient documentation

## 2023-11-27 NOTE — Therapy (Signed)
 OUTPATIENT PHYSICAL THERAPY FEMALE PELVIC TREATMENT   Patient Name: Diana Wolfe MRN: [id number] DOB:1950-04-17, 73 y.o., female Today's Date: 11/27/2023  Progress Note Reporting Period 05/25/23 to 10/27/23  See note below for Objective Data and Assessment of Progress/Goals.      END OF SESSION:  PT End of Session - 11/27/23 1016     Visit Number 11    Date for Recertification  02/01/24    Authorization Type Medicare/ [employer]    Authorization - Visit Number 11    Authorization - Number of Visits 20    PT Start Time 1015    PT Stop Time 1055    PT Time Calculation (min) 40 min    Activity Tolerance Patient tolerated treatment well    Behavior During Therapy WFL for tasks assessed/performed          Past Medical History:  Diagnosis Date   ADD (attention deficit disorder)    Allergy 1988   Anemia    Anxiety    Arthritis 2009   Depression    GERD (gastroesophageal reflux disease)    Hyperlipidemia    Hypertension    Hypothyroid    Osteoporosis    PONV (postoperative nausea and vomiting)    Sleep apnea    using cpap since 2025   Vitamin D  deficiency    Past Surgical History:  Procedure Laterality Date   BREAST BIOPSY Left 07/09/2019   CESAREAN SECTION     ESOPHAGOGASTRODUODENOSCOPY N/A 09/02/2015   Procedure: ESOPHAGOGASTRODUODENOSCOPY (EGD);  Surgeon: Lynwood Bohr, MD;  Location: [facility] ENDOSCOPY;  Service: Endoscopy;  Laterality: N/A;   ESOPHAGOGASTRODUODENOSCOPY N/A 06/05/2022   Procedure: ESOPHAGOGASTRODUODENOSCOPY (EGD);  Surgeon: Burnette Fallow, MD;  Location: [facility];  Service: Gastroenterology;  Laterality: N/A;   FOOT SURGERY Right    FOREIGN BODY REMOVAL N/A 06/05/2022   Procedure: FOREIGN BODY REMOVAL;  Surgeon: Burnette Fallow, MD;  Location: [facility] ENDOSCOPY;  Service: Gastroenterology;  Laterality: N/A;   FRACTURE SURGERY     IM NAILING TIBIA Left 01/09/2001   Dr. 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   Dysphagia 04/21/2020   Generalized abdominal pain 04/21/2020   Hematochezia 04/21/2020   Left lower quadrant pain 04/21/2020   History of colonic polyps 04/21/2020   Urticaria 04/21/2020   B12 deficiency 01/31/2019   Medication management 06/23/2014   Vaginal atrophy 03/27/2014   Hyperlipidemia    Hypertension    Anxiety    GERD (gastroesophageal reflux disease)    ADD (attention deficit disorder)    Depression    Vitamin D  deficiency    Hypothyroidism 04/25/2008   Iron deficiency anemia 04/25/2008   Obstructive sleep apnea 11/16/2007   Allergic rhinitis 11/16/2007   Headache 11/16/2007   PULMONARY EMBOLISM, HX OF 11/16/2007    PCP: Alvia Corean CROME, FNP  REFERRING PROVIDER: Alvia Corean CROME, FNP   REFERRING DIAG:  [phone] (ICD-10-CM) - Medication management  M62.89 (ICD-10-CM) - Pelvic floor dysfunction in female    THERAPY DIAG:  Cramp and spasm  Other lack of coordination  Pelvic pain  Rationale for Evaluation and  Treatment: Rehabilitation  ONSET DATE: 2021  SUBJECTIVE:                                                                                                                                                                                           SUBJECTIVE STATEMENT: The times I may of ran to the bathroom could be when I had coffee and tea. Several times I went 4 hours. I am using the vaginal trainer and gone up 1 size. I have use the third trainer.   PAIN:  Are you having pain? Yes NPRS scale: 7/10 Pain location: Vaginal  Pain type: aching Pain description: intermittent    Aggravating factors: during vaginal penetration Relieving factors: no vaginal penetration  PRECAUTIONS: None  RED FLAGS: None   WEIGHT BEARING RESTRICTIONS: No  FALLS:  Has patient fallen in last 6 months? Yes. Number of falls 1 time slipped on ice.   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  URINATION: Pain with urination: No Fully empty bladder: Yes: sometimes will empty her bladder and 10 minutes have to urinate again Stream: Strong Urgency: Yes , getting out of car, sitting in house 08/07/23: feels the urgency after driving more than 1 hour; no urgency with sitting in house.  Frequency: night time 2-3 times: daytime: 6 times 07/31/23: wake up 2 times at night 08/07/23: wake up 1 time to urinate.  Leakage: Urge to void and Walking to the bathroom 08/07/23: urinary leakage 1 time since last week when she got out of the car and rushed to the bathroom Pads: No  INTERCOURSE:not sexually active at this time but in the future would like to be able to.    PREGNANCY: C-section deliveries 1  PROLAPSE: None   OBJECTIVE:  Note: Objective measures were completed at Evaluation unless otherwise noted.  DIAGNOSTIC FINDINGS:  none  PATIENT SURVEYS:  UIQ-7: 48 PFIQ-7: 44 08/16/23: UIQ-7: 33 PFIQ-7: 28 10/27/23: UIQ-7: 48 PFIQ-7: 44  COGNITION: Overall cognitive status: Within functional limits for tasks assessed     SENSATION: Light touch: Appears intact   POSTURE: decreased lumbar lordosis   LUMBARAROM/PROM:  A/PROM A/PROM  eval A/PROM  10/27/23  Extension Decreased by 50% full   (Blank rows = not tested)  LOWER EXTREMITY ROM: bilateral hip ROM is full   LOWER EXTREMITY  MMT:  MMT Right eval Left eval Right 10/27/23 Left 10/27/23  Hip extension 4/5 4/5 5/5 5/5  Hip abduction 4/5 5/5 5/5 5/5   (Blank rows = not tested) PALPATION:    Pelvic Alignment: ASIS  Abdominal: decreased mobility of the tissue below the scar, decreased movement of the diaphragm, difficulty with opening her lower rib cage                External Perineal Exam: dry, redness along the vulva, white patch of skin along the inner left labia majora; Q-tip test was positive at 6, 11 and 1 O'Clock.                              Internal Pelvic Floor: pain with Q-tip therefore did not assess any further, when she bulged therapist did not see any prolapse, the vaginal canal was small  Patient confirms identification and approves PT to assess internal pelvic floor and treatment Yes  PELVIC MMT:not tested due to the pain   MMT 07/31/23  Vaginal 2/5  Internal Anal Sphincter   External Anal Sphincter   Puborectalis   Diastasis Recti   (Blank rows = not tested)         TODAY'S TREATMENT:  11/27/23 Manual: Scar tissue mobilization: Manual work to the scar with going through the restrictions and pulling up the scar Manual work above and below scare to elongate the tissue Neuromuscular re-education: Core retraining: Transverse abdominus 10 x Transverse abdominus contraction with curl up.  Supine trunk rotation engaging the core and pelvic floor 15 x each way Supine bilateral shoulder extension with green band and squeeze yoga block Self-care: Educated patient on the muscles of the pelvic floor and how the vaginal trainer expands the muscles and length of the vaginal canal. Educated patient on doing the vaginal trainer 3 times per week    10/27/23 Manual: Scar tissue mobilization: Using the suction cup around the scar to stretch the scar tissue and elongate the fiber.  Pulling up the scar to reduce the adherence of the scar Fascial release around the scar Myofascial release: Fascial  release around the left and right side of the abdomen to work on tissue around the ureter Exercises: Stretches/mobility: Lumbar extension Strengthening: Muscle testing for strength    10/18/23 Manual: Myofascial release: Fascial release to the urogenital diaphragm and perineal body to elongate  Internal pelvic floor techniques: No emotional/communication barriers or cognitive limitation. Patient is motivated to learn. Patient understands and agrees with treatment goals and plan. PT explains patient will be examined in standing, sitting, and lying down to see how their muscles and joints work. When they are ready, they will be asked to remove their underwear so PT can examine their perineum. The patient is also given the option of providing their own chaperone as one is not provided in our facility. The patient also has the right and is explained the right to defer or refuse any part of the evaluation or treatment including the internal exam. With the patient's consent, PT will use one gloved finger to gently assess the muscles of the pelvic floor, seeing how well it contracts and relaxes and if there is muscle symmetry. After, the patient will get dressed and PT and patient will discuss exam findings and plan of care. PT and patient discuss plan of care, schedule, attendance policy and HEP activities.  Therapist gloved finger in the vaginal canal working on the left levator ani, obturator internist Therapist gloved finger in the vaginal canal working  on the left side of the bladder with the other hand on the outside of the bladder to increase mobility Exercises: Stretches/mobility: Sitting piriformis stretch holding 30 sec Self-care: Educated patient on how to progress to the next size dilator since she is not having pain with the smaller one.     PATIENT EDUCATION:  09/15/23 Education details: Access Code: [email or web address], abdominal scar massage Person educated: Patient Education method:  Explanation, Demonstration, Tactile cues, Verbal cues, and Handouts Education comprehension: verbalized understanding, returned demonstration, verbal cues required, tactile cues required, and needs further education  HOME EXERCISE PROGRAM: 09/15/23 Access Code: [email or web address] URL: [email or web address] Date: 09/15/2023 Prepared by: Channing Pereyra  Program Notes sit on ball and massage the pelvic floor daily for several minutes.   Exercises - Seated Diaphragmatic Breathing  - 1 x daily - 7 x weekly - 3 sets - 10 reps - Seated Piriformis Stretch with Trunk Bend  - 1 x daily - 7 x weekly - 1 sets - 2 reps - 30 sec hold - Seated Happy Baby With Trunk Flexion For Pelvic Relaxation  - 1 x daily - 7 x weekly - 1 sets - 1 reps - 30 sec hold - Supine Piriformis Stretch with Leg Straight  - 1 x daily - 7 x weekly - 1 sets - 2 reps - 30 sec hold - Hooklying Transversus Abdominis Palpation  - 1 x daily - 3 x weekly - 1 sets - 10 reps - Hooklying Isometric Hip Flexion  - 1 x daily - 3 x weekly - 1 sets - 10 reps - Quadruped Pelvic Floor Contraction with Opposite Arm and Leg Lift  - 1 x daily - 1 x weekly - 2 sets - 10 reps - Posterior Chain Stretch  - 1 x daily - 3 x weekly - 1 sets - 5 reps - Butterfly Groin Stretch  - 1 x daily - 3 x weekly - 1 sets - 1 reps - 30 sec hold - Quadruped Yoga Block Lift Off  - 1 x daily - 3 x weekly - 1 sets - 10 reps   ASSESSMENT:  CLINICAL IMPRESSION: Patient is a 73 y.o. female who was seen today for physical therapy  treatment for pelvis floor dysfunction.  Patient is using the third dilator with stretching feeling but no pain. Sh e is now able to engage the lower abdomen better and feel it contraction. Increased mobility of the lower abdominal scar. Patient has been able to go 4 hours without urinating several times. She will have urgency after having coffee and tea.  Patient will benefit from skilled therapy to improve pelvic floor tissue health, elongation  of the vaginal canal and working on coordination to reduce urgency.   OBJECTIVE IMPAIRMENTS: decreased coordination, decreased ROM, decreased strength, increased fascial restrictions, increased muscle spasms, impaired tone, and pain.   ACTIVITY LIMITATIONS: continence, toileting, and locomotion level  PARTICIPATION LIMITATIONS: interpersonal relationship, driving, shopping, and community activity  PERSONAL FACTORS: Fitness, Time since onset of injury/illness/exacerbation, and 1-2 comorbidities: Hypothyroid; Hypertension; Cesarean section are also affecting patient's functional outcome.   REHAB POTENTIAL: Good  CLINICAL DECISION MAKING: Evolving/moderate complexity  EVALUATION COMPLEXITY: Moderate   GOALS: Goals reviewed with patient? Yes  SHORT TERM GOALS: Target date: 06/22/23  Patient educated on vaginal moisturizers and understands how they are used and why to improve the health of the vaginal tissue.  Baseline: Goal status: Met 05/29/23  2.  Patient is able to perform diaphragmatic breathing to elongate her pelvic floor.  Baseline:  Goal status: met 07/24/23  3.  Patient understands was to perform perineal massage externally to start to relax the pelvic floor tissue.  Baseline:  Goal status: Met 07/31/23  4.  Patient is independent with flexibility exercises to indirectly elongate the pelvic floor.  Baseline:  Goal status: Met 07/24/23  5.  Patient educated on vaginal dilators to assist with the expansion of the pelvic floor.  Baseline:  Goal status: met 08/16/23  LONG TERM GOALS: Target date: 01/27/24  Patient independent with advanced HEP to increase strength of core and pelvic floor.  Baseline:  Goal status: ongoing 10/27/23  2.  Patient is able to use the dilator that is appropriate for her goal to have vaginal penetration with pain level </= 0-2/10.  Baseline: Patient is on the 2nd one Goal status: ongoing 10/27/23  3.  Patient pelvic floor strength increased to reduce  urinary leakage with urgency.  Baseline:  Goal status: ongoing 10/27/23  4.  Patient is able to use the behavioral technique to reduce the urge to void so she is able to slowly walk to the bathroom without leaking urine and enter her house.  Baseline:  Goal status: ongoing 10/27/23  5.  Patient reports she is able to urinate 1-2 times at night due to using the urge to void technique.  Baseline:  Goal status: Met 08/07/23   PLAN:  PT FREQUENCY: 1-2x/week  PT DURATION: 12 weeks  PLANNED INTERVENTIONS: 97110-Therapeutic exercises, 97530- Therapeutic activity, 97112- Neuromuscular re-education, 97535- Self Care, [id number]- Manual therapy, [id number]- Electrical stimulation (unattended), 97035- Ultrasound, Patient/Family education, Dry Needling, Joint mobilization, Spinal mobilization, Scar mobilization, Cryotherapy, Moist heat, and Biofeedback  PLAN FOR NEXT SESSION:  internal work to pelvic floor  on the right, work on SI joint mobility, hip mobility and lumbar mobility, lower abdominal contraction   Channing Pereyra, PT 11/27/23 11:01 AM

## 2023-11-28 ENCOUNTER — Other Ambulatory Visit: Payer: Self-pay

## 2023-11-28 DIAGNOSIS — E039 Hypothyroidism, unspecified: Secondary | ICD-10-CM

## 2023-11-28 MED ORDER — LEVOTHYROXINE SODIUM 75 MCG PO TABS: ORAL_TABLET | ORAL | 1 refills | Status: DC

## 2023-11-29 ENCOUNTER — Ambulatory Visit

## 2023-11-29 VITALS — BP 173/106 | HR 75 | Temp 98.2°F | Ht 62.0 in | Wt 156.0 lb

## 2023-11-29 DIAGNOSIS — L989 Disorder of the skin and subcutaneous tissue, unspecified: Secondary | ICD-10-CM

## 2023-11-29 NOTE — Progress Notes (Signed)
 NAME: Diana Wolfe  MRN: [id number]  DOB: 10-30-1950   Referring physician: Alvia Corean CROME, *  PCP: Alvia Corean CROME, FNP   CHIEF COMPLAINT: Right facial lesion.    HPI:  Diana Wolfe is a 73 y.o. year old female who presents with a facial lesion that is non painful, soft and has enlarged over the last few months slowly. No episodes of infection noted. She has a dermatology appointment in November. Non-smoker.  PMH:  Past Medical History:  Diagnosis Date   ADD (attention deficit disorder)    Allergy 1988   Anemia    Anxiety    Arthritis 2009   Depression    GERD (gastroesophageal reflux disease)    Hyperlipidemia    Hypertension    Hypothyroid    Osteoporosis    PONV (postoperative nausea and vomiting)    Sleep apnea    using cpap since 2025   Vitamin D  deficiency       PSH:  Past Surgical History:  Procedure Laterality Date   BREAST BIOPSY Left 07/09/2019   CESAREAN SECTION     ESOPHAGOGASTRODUODENOSCOPY N/A 09/02/2015   Procedure: ESOPHAGOGASTRODUODENOSCOPY (EGD);  Surgeon: Lynwood Bohr, MD;  Location: [facility] ENDOSCOPY;  Service: Endoscopy;  Laterality: N/A;   ESOPHAGOGASTRODUODENOSCOPY N/A 06/05/2022   Procedure: ESOPHAGOGASTRODUODENOSCOPY (EGD);  Surgeon: Burnette Fallow, MD;  Location: [facility];  Service: Gastroenterology;  Laterality: N/A;   FOOT SURGERY Right    FOREIGN BODY REMOVAL N/A 06/05/2022   Procedure: FOREIGN BODY REMOVAL;  Surgeon: Burnette Fallow, MD;  Location: [facility] ENDOSCOPY;  Service: Gastroenterology;  Laterality: N/A;   FRACTURE SURGERY     IM NAILING TIBIA Left 01/09/2001   Dr. Jerrell Mt, [place]   TONSILLECTOMY  2002     MEDICATIONS:   Current Outpatient Medications:    amphetamine -dextroamphetamine  (ADDERALL) 20 MG tablet, Take 1 tablet (20 mg total) by mouth daily., Disp: 30 tablet, Rfl: 0   amphetamine -dextroamphetamine  (ADDERALL) 20 MG tablet, Take 1 tablet (20 mg total) by mouth daily., Disp: 30  tablet, Rfl: 0   amphetamine -dextroamphetamine  (ADDERALL) 20 MG tablet, Take 1 tablet (20 mg total) by mouth daily., Disp: 30 tablet, Rfl: 0   aspirin 81 MG chewable tablet, 3-4 days a week, Disp: , Rfl:    cetirizine (ZYRTEC) 10 MG tablet, PRN, Disp: , Rfl:    Cholecalciferol (VITAMIN D3) 10 MCG (400 UNIT) CAPS, Take 2 capsules by mouth daily., Disp: 180 capsule, Rfl: 1   Cyanocobalamin  (B-12) 1000 MCG TABS, Take by mouth., Disp: , Rfl:    diclofenac (VOLTAREN) 75 MG EC tablet, Take 75 mg by mouth 2 (two) times daily., Disp: , Rfl:    estradiol  (ESTRACE  VAGINAL) 0.1 MG/GM vaginal cream, Place 1 Applicatorful vaginally at bedtime., Disp: 42.5 g, Rfl: 12   hydrALAZINE  (APRESOLINE ) 10 MG tablet, Take half tablet as needed for systolic blood pressure (top number) more than 190. If blood pressure remains more than 190 one hour after taking half tablet, may take additional half tablet., Disp: 30 tablet, Rfl: 1   levothyroxine  (SYNTHROID ) 75 MCG tablet, TAKE 1 AND 1/2 TABLETS BY MOUTH ON MONDAYS, WEDNESDAYS AND FRIDAYS THEN TAKE 1 TABLET ON OTHER DAYS OF THE WEEK. TAKE ON EMPTY STOMACH WITH ONLY WATER FOR 30 MINUTES AND NO ANTACIDS, CALCIUM  OR MAGNESIUM FOR 4 HOURS AND AVOID BIOTIN, Disp: 135 tablet, Rfl: 1   meclizine  (ANTIVERT ) 25 MG tablet, 1/2-1 pill up to 3 times daily for motion sickness/dizziness, Disp: 30 tablet, Rfl: 0  meloxicam  (MOBIC ) 15 MG tablet, TAKE 1 TABLET BY MOUTH DAILY, Disp: 30 tablet, Rfl: 0   Multiple Vitamin (MULTIVITAMIN) capsule, Take 1 capsule by mouth daily., Disp: , Rfl:    omeprazole  (PRILOSEC) 40 MG capsule, Take 1 capsule (40 mg total) by mouth daily. TAKE ONE CAPSULE BY MOUTH DAILY TO PREVENT HEARTBURN AND INDIGESTION (Patient taking differently: Take 40 mg by mouth as needed. TAKE ONE CAPSULE BY MOUTH DAILY TO PREVENT HEARTBURN AND INDIGESTION), Disp: 90 capsule, Rfl: 1   vitamin C (ASCORBIC ACID) 500 MG tablet, Take 1,200 mg by mouth daily., Disp: , Rfl:    Vitamin D ,  Ergocalciferol , (DRISDOL ) 1.25 MG (50000 UNIT) CAPS capsule, TAKE 1 CAPSULE BY MOUTH 3 TIMES A WEEK ON MONDAY- WEDNESDAY- FRIDAY FOR SEVERE VITAMIN D . DEFICIENCY, Disp: 36 capsule, Rfl: 3   ALLERGIES:  is allergic to levofloxacin, buspirone , cefprozil, codeine, hydrocodone-acetaminophen , hydrocodone-acetaminophen , morphine, nitrofuran derivatives, pravastatin, and lexapro  [escitalopram  oxalate].   FAMILY HISTORY:  Family History  Problem Relation Age of Onset   Breast cancer Mother        17s   Cancer Mother 69       Breast   Asthma Mother    Diabetes Mother    Stroke Mother    Heart disease Father    Hypertension Father    Cancer Father    Breast cancer Cousin        twice 50, 21   Stroke Maternal Grandmother    Heart disease Paternal Grandfather    Cancer Paternal Grandmother    Asthma Sister       VITALS:  Vitals:   11/29/23 0929  BP: (!) 173/106  Pulse: 75  Temp: 98.2 F (36.8 C)  SpO2: 98%    Constitutional: Good color, good hydration. VSS. Head and Neck: No lymphadenopathy, thyromegaly or masses  Chest: Normal breathing, Normal shape and excursion.  Cor: RRR,  Small lesion on right face. Currently not infected.  Mobile and not attached to underlying structures.  No basin lymphadenopathy, satellite lesions or in-transit lesions.   ASSESSMENT/PLAN  Assessment & Plan   Pt. presents with a cutaneous lesion on right face.  Today we discussed the risks, benefits and alternatives to mass excision. We discussed the alternatives which include continued observation; however, I told the patient that I do not believe this mass will resolve on its own. We then discussed the benefits of surgical excision which include complete removal of the lesion. We discussed the risks of excision which include seroma, hematoma, infection, bleeding, damage to surrounding healthy tissue, damage to surrounding structures and need for further surgery. We also discussed the risks  of wound separation and recurrence of the mass. We discussed scar patterns of tissue rearrangement, if needed.  I explained that the mass will be sent to pathology and if it were to be malignant further surgery may be needed. We discussed the risks of anesthesia. The patient has a good understanding of all the risks and benefits, postoperative course and care. We obtained pictures. All questions were answered. Patient will see dermatology and if a resection is needed she will return to the office in December.   Sabrena Gavitt M. Breckyn Troyer, MD [facility]

## 2023-12-01 ENCOUNTER — Ambulatory Visit: Admitting: Internal Medicine

## 2023-12-07 ENCOUNTER — Other Ambulatory Visit: Payer: Self-pay | Admitting: Family Medicine

## 2023-12-07 ENCOUNTER — Encounter: Payer: Self-pay | Admitting: Family Medicine

## 2023-12-07 DIAGNOSIS — E559 Vitamin D deficiency, unspecified: Secondary | ICD-10-CM

## 2023-12-07 DIAGNOSIS — F902 Attention-deficit hyperactivity disorder, combined type: Secondary | ICD-10-CM

## 2023-12-07 MED ORDER — VITAMIN D3 10 MCG (400 UNIT) PO CAPS: 2.0000 | ORAL_CAPSULE | Freq: Every day | ORAL | 1 refills | Status: AC

## 2023-12-07 MED ORDER — AMPHETAMINE-DEXTROAMPHETAMINE 20 MG PO TABS: 20.0000 mg | ORAL_TABLET | Freq: Every day | ORAL | 0 refills | Status: DC

## 2023-12-07 NOTE — Telephone Encounter (Unsigned)
 Copied from CRM [id number]. Topic: Clinical - Medication Refill >> Dec 07, 2023  2:38 PM [place] A wrote: Medication: amphetamine -dextroamphetamine  (ADDERALL) 20 MG tablet    Has the patient contacted their pharmacy? Yes (Agent: If no, request that the patient contact the pharmacy for the refill. If patient does not wish to contact the pharmacy document the reason why and proceed with request.) (Agent: If yes, when and what did the pharmacy advise?) Pt has to contact primary  This is the patient's preferred pharmacy:  [facility] [id number] - [place] - [address] [place] [address] Phone: [phone] Fax: [phone]   Is this the correct pharmacy for this prescription? Yes If no, delete pharmacy and type the correct one.   Has the prescription been filled recently? No  Is the patient out of the medication? Yes  Has the patient been seen for an appointment in the last year OR does the patient have an upcoming appointment? Yes  Can we respond through MyChart? Yes  Agent: Please be advised that Rx refills may take up to 3 business days. We ask that you follow-up with your pharmacy.

## 2023-12-08 NOTE — Telephone Encounter (Signed)
Medication sent in already 

## 2023-12-13 ENCOUNTER — Encounter (INDEPENDENT_AMBULATORY_CARE_PROVIDER_SITE_OTHER): Payer: Self-pay | Admitting: Physician Assistant

## 2023-12-13 ENCOUNTER — Ambulatory Visit (INDEPENDENT_AMBULATORY_CARE_PROVIDER_SITE_OTHER): Admitting: Physician Assistant

## 2023-12-13 ENCOUNTER — Institutional Professional Consult (permissible substitution) (INDEPENDENT_AMBULATORY_CARE_PROVIDER_SITE_OTHER): Admitting: Otolaryngology

## 2023-12-13 VITALS — BP 202/78

## 2023-12-13 DIAGNOSIS — G4733 Obstructive sleep apnea (adult) (pediatric): Secondary | ICD-10-CM

## 2023-12-13 DIAGNOSIS — G4730 Sleep apnea, unspecified: Secondary | ICD-10-CM | POA: Diagnosis not present

## 2023-12-13 NOTE — Progress Notes (Unsigned)
 Dear Dr. Neysa, Here is my assessment for our mutual patient, Diana Wolfe. Thank you for allowing me the opportunity to care for your patient. Please do not hesitate to contact me should you have any other questions. Sincerely, Chyrl Cohen PA-C  Otolaryngology Clinic Note Referring provider: Dr. Neysa HPI:  Diana Wolfe is a 73 y.o. female kindly referred by Dr. Neysa   The patient is a 73 year old female seen in our office for evaluation of obstructive sleep apnea.  The patient notes a longstanding history of obstructive sleep apnea, she is currently followed by Dr. Neysa of pulmonology.  She had a sleep study that was completed on 01/08/2023 demonstrating a moderate level of abnormal breathing events with clinically significant levels of oxygen desaturation.  Moderate obstructive sleep apnea with an AHI of 21.7/h.  Her AHI 4% was 19% central, AHI 3% 11% central.  She notes she was placed on CPAP device, she notes that she has had to change her mask 3 times over the last year due to discomfort and inability to tolerate.  She wakes up through the night to adjust her mask.  She is also attempted using an oral appliance but this did not work as well.  She notes remote history of tonsillectomy, septoplasty, as well as uvulopalatopharyngoplasty approximately 20 years ago which did not significantly improve her breathing.  She also notes a history of hiatal hernia, she occasionally gets esophageal obstruction, and has to had endoscopy 4 times in the last 20 years for esophageal obstruction.  No history of any other head or neck surgery other than biopsy of her breast for potential breast cancer, no treatment.  No history of annual esthesia related issues.  She does not take any anticoagulation and occasionally takes an 81 mg of aspirin preventatively.  She notes that she has been diagnosed with intermittent high blood pressure, they attempted medical management but notes that this dropped her blood  pressure too low.  She notes that normally at home her blood pressure is within a normal range but when she goes into the doctor's office it is always significantly elevated.  She denies any associated symptoms with this including chest pain, headache or any neurologic complaints.    Independent Review of Additional Tests or Records:  Sleep Study 12/29/2022 Patient demonstrated a moderate level of abnormal breathing events with clinically significant levels of oxygen desaturation.  Moderate obstructive sleep apnea AHI 4% 21.7/h.  Snoring with oxygen desaturation of nadir of 68% and a time with O2 saturations of 88% or less was 60 minutes.  AHI 4% 21.7 with 9% central, AHI 3% 37.2 with 11% central.   PMH/Meds/All/SocHx/FamHx/ROS:   Past Medical History:  Diagnosis Date   ADD (attention deficit disorder)    Allergy 1988   Anemia    Anxiety    Arthritis 2009   Depression    GERD (gastroesophageal reflux disease)    Hyperlipidemia    Hypertension    Hypothyroid    Osteoporosis    PONV (postoperative nausea and vomiting)    Sleep apnea    using cpap since 2025   Vitamin D  deficiency      Past Surgical History:  Procedure Laterality Date   BREAST BIOPSY Left 07/09/2019   CESAREAN SECTION     ESOPHAGOGASTRODUODENOSCOPY N/A 09/02/2015   Procedure: ESOPHAGOGASTRODUODENOSCOPY (EGD);  Surgeon: Lynwood Bohr, MD;  Location: [facility] ENDOSCOPY;  Service: Endoscopy;  Laterality: N/A;   ESOPHAGOGASTRODUODENOSCOPY N/A 06/05/2022   Procedure: ESOPHAGOGASTRODUODENOSCOPY (EGD);  Surgeon: Burnette Fallow, MD;  Location: [facility] ENDOSCOPY;  Service: Gastroenterology;  Laterality: N/A;   FOOT SURGERY Right    FOREIGN BODY REMOVAL N/A 06/05/2022   Procedure: FOREIGN BODY REMOVAL;  Surgeon: Burnette Fallow, MD;  Location: [facility] ENDOSCOPY;  Service: Gastroenterology;  Laterality: N/A;   FRACTURE SURGERY     IM NAILING TIBIA Left 01/09/2001   Dr. Jerrell Mt, [place]   TONSILLECTOMY  2002    Family History   Problem Relation Age of Onset   Breast cancer Mother        51s   Cancer Mother 78       Breast   Asthma Mother    Diabetes Mother    Stroke Mother    Heart disease Father    Hypertension Father    Cancer Father    Breast cancer Cousin        twice 51, 52   Stroke Maternal Grandmother    Heart disease Paternal Grandfather    Cancer Paternal Grandmother    Asthma Sister      Social Connections: Socially Isolated (10/25/2023)   Social Connection and Isolation Panel    Frequency of Communication with Friends and Family: Never    Frequency of Social Gatherings with Friends and Family: Never    Attends Religious Services: More than 4 times per year    Active Member of Clubs or Organizations: No    Attends Banker Meetings: Never    Marital Status: Divorced      Current Outpatient Medications:    amphetamine -dextroamphetamine  (ADDERALL) 20 MG tablet, Take 1 tablet (20 mg total) by mouth daily., Disp: 30 tablet, Rfl: 0   amphetamine -dextroamphetamine  (ADDERALL) 20 MG tablet, Take 1 tablet (20 mg total) by mouth daily., Disp: 30 tablet, Rfl: 0   amphetamine -dextroamphetamine  (ADDERALL) 20 MG tablet, Take 1 tablet (20 mg total) by mouth daily., Disp: 30 tablet, Rfl: 0   [START ON 01/06/2024] amphetamine -dextroamphetamine  (ADDERALL) 20 MG tablet, Take 1 tablet (20 mg total) by mouth daily., Disp: 30 tablet, Rfl: 0   [START ON 02/04/2024] amphetamine -dextroamphetamine  (ADDERALL) 20 MG tablet, Take 1 tablet (20 mg total) by mouth daily., Disp: 30 tablet, Rfl: 0   aspirin 81 MG chewable tablet, 3-4 days a week, Disp: , Rfl:    cetirizine (ZYRTEC) 10 MG tablet, PRN, Disp: , Rfl:    Cholecalciferol (VITAMIN D3) 10 MCG (400 UNIT) CAPS, Take 2 capsules by mouth daily., Disp: 180 capsule, Rfl: 1   Cyanocobalamin  (B-12) 1000 MCG TABS, Take by mouth., Disp: , Rfl:    diclofenac (VOLTAREN) 75 MG EC tablet, Take 75 mg by mouth 2 (two) times daily., Disp: , Rfl:    estradiol  (ESTRACE   VAGINAL) 0.1 MG/GM vaginal cream, Place 1 Applicatorful vaginally at bedtime., Disp: 42.5 g, Rfl: 12   hydrALAZINE  (APRESOLINE ) 10 MG tablet, Take half tablet as needed for systolic blood pressure (top number) more than 190. If blood pressure remains more than 190 one hour after taking half tablet, may take additional half tablet., Disp: 30 tablet, Rfl: 1   levothyroxine  (SYNTHROID ) 75 MCG tablet, TAKE 1 AND 1/2 TABLETS BY MOUTH ON MONDAYS, WEDNESDAYS AND FRIDAYS THEN TAKE 1 TABLET ON OTHER DAYS OF THE WEEK. TAKE ON EMPTY STOMACH WITH ONLY WATER FOR 30 MINUTES AND NO ANTACIDS, CALCIUM  OR MAGNESIUM FOR 4 HOURS AND AVOID BIOTIN, Disp: 135 tablet, Rfl: 1   meclizine  (ANTIVERT ) 25 MG tablet, 1/2-1 pill up to 3 times daily for motion sickness/dizziness, Disp: 30 tablet, Rfl: 0   meloxicam  (  MOBIC ) 15 MG tablet, TAKE 1 TABLET BY MOUTH DAILY, Disp: 30 tablet, Rfl: 0   Multiple Vitamin (MULTIVITAMIN) capsule, Take 1 capsule by mouth daily., Disp: , Rfl:    omeprazole  (PRILOSEC) 40 MG capsule, Take 1 capsule (40 mg total) by mouth daily. TAKE ONE CAPSULE BY MOUTH DAILY TO PREVENT HEARTBURN AND INDIGESTION (Patient taking differently: Take 40 mg by mouth as needed. TAKE ONE CAPSULE BY MOUTH DAILY TO PREVENT HEARTBURN AND INDIGESTION), Disp: 90 capsule, Rfl: 1   vitamin C (ASCORBIC ACID) 500 MG tablet, Take 1,200 mg by mouth daily., Disp: , Rfl:    Vitamin D , Ergocalciferol , (DRISDOL ) 1.25 MG (50000 UNIT) CAPS capsule, TAKE 1 CAPSULE BY MOUTH 3 TIMES A WEEK ON MONDAY- WEDNESDAY- FRIDAY FOR SEVERE VITAMIN D . DEFICIENCY, Disp: 36 capsule, Rfl: 3   Physical Exam:   There were no vitals taken for this visit.  Pertinent Findings  CN II-XII intact Bilateral EAC clear and TM intact with well pneumatized middle ear spaces Anterior rhinoscopy: Septum midline; bilateral inferior turbinates with normal hypertrophy No lesions of oral cavity/oropharynx; dentition is in normal limits No obviously palpable neck  masses/lymphadenopathy/thyromegaly No respiratory distress or stridor  Seprately Identifiable Procedures:  None  Impression & Plans:  Camilah Spillman is a 73 y.o. female with the following   Sleep apnea-  73 year old female presenting today for discussion of inspire.  She struggles with using her CPAP device and does not feel like she can wear it through the night due to discomfort.  She has tried numerous masks with no relief.  She would be a candidate for inspire based on the above information as well as her sleep study with an AHI of 21.7/h with 11% central.  I discussed the surgery as well as indications.  She would like to think about her options.  I also discussed the significant elevated blood pressure at 202/78 here today.  She is asymptomatic from this.  She assures me that her blood pressure is normal when she is at home but whenever she goes into the doctor it significantly elevated.  She has had difficulty managing this with medications.  Given the significant elevation I would recommend following up with her primary care provider to discuss treatment options, until her blood pressure is more adequately managed we would not want to proceed with operative intervention.  The patient will reach out to her primary care provider to discuss treatment options.  She will follow-up with us  with any future questions or concerns.   - f/u PRN   Thank you for allowing me the opportunity to care for your patient. Please do not hesitate to contact me should you have any other questions.  Sincerely, Chyrl Cohen PA-C [facility] Phone: [phone] Fax: [phone]  12/13/2023, 1:10 PM

## 2023-12-13 NOTE — Progress Notes (Unsigned)
 Patient explains that she has white coat syndrome. Patient says that her BP was not high when she took it this morning at home. Patient is having BP managed with medication. Patient declines need for an ambulance as this is normal for her.

## 2023-12-18 ENCOUNTER — Encounter: Payer: Self-pay | Admitting: Physical Therapy

## 2023-12-18 ENCOUNTER — Ambulatory Visit: Admitting: Physical Therapy

## 2023-12-18 DIAGNOSIS — R278 Other lack of coordination: Secondary | ICD-10-CM

## 2023-12-18 DIAGNOSIS — R102 Pelvic and perineal pain unspecified side: Secondary | ICD-10-CM | POA: Diagnosis not present

## 2023-12-18 DIAGNOSIS — R252 Cramp and spasm: Secondary | ICD-10-CM | POA: Diagnosis not present

## 2023-12-18 NOTE — Therapy (Signed)
 OUTPATIENT PHYSICAL THERAPY FEMALE PELVIC TREATMENT   Patient Name: Diana Wolfe MRN: [id number] DOB:[birth year], 73 y.o., female Today's Date: 12/18/2023     END OF SESSION:  PT End of Session - 12/18/23 1449     Visit Number 12    Date for Recertification  02/01/24    Authorization Type Medicare/ [employer]    Authorization - Visit Number 12    Authorization - Number of Visits 20    PT Start Time 1445    PT Stop Time 1525    PT Time Calculation (min) 40 min    Activity Tolerance Patient tolerated treatment well    Behavior During Therapy [facility] for tasks assessed/performed          Past Medical History:  Diagnosis Date   ADD (attention deficit disorder)    Allergy 1988   Anemia    Anxiety    Arthritis 2009   Depression    GERD (gastroesophageal reflux disease)    Hyperlipidemia    Hypertension    Hypothyroid    Osteoporosis    PONV (postoperative nausea and vomiting)    Sleep apnea    using cpap since 2025   Vitamin D  deficiency    Past Surgical History:  Procedure Laterality Date   BREAST BIOPSY Left 07/09/2019   CESAREAN SECTION     ESOPHAGOGASTRODUODENOSCOPY N/A 09/02/2015   Procedure: ESOPHAGOGASTRODUODENOSCOPY (EGD);  Surgeon: Lynwood Bohr, MD;  Location[facility] ENDOSCOPY;  Service: Endoscopy;  Laterality: N/A;   ESOPHAGOGASTRODUODENOSCOPY N/A 06/05/2022   Procedure: ESOPHAGOGASTRODUODENOSCOPY (EGD);  Surgeon: Burnette Fallow, MD;  Location: [facility];  Service: Gastroenterology;  Laterality: N/A;   FOOT SURGERY Right    FOREIGN BODY REMOVAL N/A 06/05/2022   Procedure: FOREIGN BODY REMOVAL;  Surgeon: Burnette Fallow, MD;  Location: [facility] ENDOSCOPY;  Service: Gastroenterology;  Laterality: N/A;   FRACTURE SURGERY     IM NAILING TIBIA Left 01/09/2001   Dr. 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   Dysphagia 04/21/2020   Generalized abdominal pain 04/21/2020   Hematochezia 04/21/2020   Left lower quadrant pain 04/21/2020   History of colonic polyps 04/21/2020   Urticaria 04/21/2020   B12 deficiency 01/31/2019   Medication management 06/23/2014   Vaginal atrophy 03/27/2014   Hyperlipidemia    Hypertension    Anxiety    GERD (gastroesophageal reflux disease)    ADD (attention deficit disorder)    Depression    Vitamin D  deficiency    Hypothyroidism 04/25/2008   Iron deficiency anemia 04/25/2008   Obstructive sleep apnea 11/16/2007   Allergic rhinitis 11/16/2007   Headache 11/16/2007   PULMONARY EMBOLISM, HX OF 11/16/2007    PCP: Alvia Corean CROME, FNP  REFERRING PROVIDER: Alvia Corean CROME, FNP   REFERRING DIAG:  [phone] (ICD-10-CM) - Medication management  M62.89 (ICD-10-CM) - Pelvic floor dysfunction in female    THERAPY DIAG:  Cramp and spasm  Other lack of coordination  Pelvic pain  Rationale for Evaluation and Treatment: Rehabilitation  ONSET DATE: 2021  SUBJECTIVE:  SUBJECTIVE STATEMENT: I am on the third dilator.   PAIN:  Are you having pain? Yes NPRS scale: 7/10 Pain location: Vaginal  Pain type: aching Pain description: intermittent   Aggravating factors: during vaginal penetration Relieving factors: no vaginal penetration  PRECAUTIONS: None  RED FLAGS: None   WEIGHT BEARING RESTRICTIONS: No  FALLS:  Has patient fallen in last 6 months? Yes. Number of falls 1 time slipped on ice.   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  URINATION: Pain with urination: No Fully empty bladder: Yes: sometimes will empty her bladder and 10 minutes have to urinate again Stream: Strong Urgency: Yes , getting out of car, sitting in house 08/07/23: feels the urgency after driving more than 1 hour; no urgency with sitting in house.  Frequency: night time 2-3 times: daytime: 6 times 07/31/23: wake up 2 times at night 08/07/23: wake up 1 time to urinate.  Leakage: Urge to void and Walking to the bathroom 08/07/23: urinary leakage 1 time since last week when she got out of the car and rushed to the bathroom Pads: No  INTERCOURSE:not sexually active at this time but in the future would like to be able to.    PREGNANCY: C-section deliveries 1  PROLAPSE: None   OBJECTIVE:  Note: Objective measures were completed at Evaluation unless otherwise noted.  DIAGNOSTIC FINDINGS:  none  PATIENT SURVEYS:  UIQ-7: 48 PFIQ-7: 44 08/16/23: UIQ-7: 33 PFIQ-7: 28 10/27/23: UIQ-7: 48 PFIQ-7: 44  COGNITION: Overall cognitive status: Within functional limits for tasks assessed     SENSATION: Light touch: Appears intact   POSTURE: decreased lumbar lordosis   LUMBARAROM/PROM:  A/PROM A/PROM  eval A/PROM  10/27/23  Extension Decreased by 50% full   (Blank rows = not tested)  LOWER EXTREMITY ROM: bilateral hip ROM is full   LOWER EXTREMITY MMT:  MMT Right eval Left eval Right 10/27/23 Left 10/27/23  Hip extension 4/5 4/5 5/5 5/5  Hip abduction 4/5 5/5 5/5 5/5   (Blank rows = not tested) PALPATION:    Pelvic Alignment: ASIS  Abdominal: decreased mobility of the tissue below the scar, decreased movement of the diaphragm,  difficulty with opening her lower rib cage                External Perineal Exam: dry, redness along the vulva, white patch of skin along the inner left labia majora; Q-tip test was positive at 6, 11 and 1 O'Clock.                              Internal Pelvic Floor: pain with Q-tip therefore did not assess any further, when she bulged therapist did not see any prolapse, the vaginal canal was small  Patient confirms identification and approves PT to assess internal pelvic floor and treatment Yes  PELVIC MMT:not tested due to the pain   MMT 07/31/23 12/18/23  Vaginal 2/5 3/5 4 s  Internal Anal Sphincter    External Anal Sphincter    Puborectalis    Diastasis Recti    (Blank rows = not tested)         TODAY'S TREATMENT:  12/18/23 Manual: Soft tissue mobilization: Manual work to  the ischiocavernosus, urogenital diaphragm, superior transverse perineum Internal pelvic floor techniques: No emotional/communication barriers or cognitive limitation. Patient is motivated to learn. Patient understands and agrees with treatment goals and plan. PT explains patient will be examined in standing, sitting, and lying down to see how their muscles and joints work. When they are ready, they will be asked to remove their underwear so PT can examine their perineum. The patient is also given the option of providing their own chaperone as one is not provided in our facility. The patient also has the right and is explained the right to defer or refuse any part of the evaluation or treatment including the internal exam. With the patient's consent, PT will use one gloved finger to gently assess the muscles of the pelvic floor, seeing how well it contracts and relaxes and if there is muscle symmetry. After, the patient will get dressed and PT and patient will discuss exam findings and plan of care. PT and patient discuss plan of care, schedule, attendance policy and HEP activities.  Therapist gloved finger working on  the introitus to expand slowly and elongate the tissue to prepare for a larger dilator Self-care: Therapist discussed with patient on using her dilator more often, 3 days instead of 2 Therapist discussed with patient to move the dilator around to increase the introitus.  Therapist gave patient more samples of the uber lube to use with her dilators    11/27/23 Manual: Scar tissue mobilization: Manual work to the scar with going through the restrictions and pulling up the scar Manual work above and below scare to elongate the tissue Neuromuscular re-education: Core retraining: Transverse abdominus 10 x Transverse abdominus contraction with curl up.  Supine trunk rotation engaging the core and pelvic floor 15 x each way Supine bilateral shoulder extension with green band and squeeze yoga block Self-care: Educated patient on the muscles of the pelvic floor and how the vaginal trainer expands the muscles and length of the vaginal canal. Educated patient on doing the vaginal trainer 3 times per week    10/27/23 Manual: Scar tissue mobilization: Using the suction cup around the scar to stretch the scar tissue and elongate the fiber.  Pulling up the scar to reduce the adherence of the scar Fascial release around the scar Myofascial release: Fascial release around the left and right side of the abdomen to work on tissue around the ureter Exercises: Stretches/mobility: Lumbar extension Strengthening: Muscle testing for strength     PATIENT EDUCATION:  09/15/23 Education details: Access Code: [email or web address], abdominal scar massage Person educated: Patient Education method: Explanation, Demonstration, Tactile cues, Verbal cues, and Handouts Education comprehension: verbalized understanding, returned demonstration, verbal cues required, tactile cues required, and needs further education  HOME EXERCISE PROGRAM: 09/15/23 Access Code: [email or web address] URL: [email or web address] Date:  09/15/2023 Prepared by: Channing Pereyra  Program Notes sit on ball and massage the pelvic floor daily for several minutes.   Exercises - Seated Diaphragmatic Breathing  - 1 x daily - 7 x weekly - 3 sets - 10 reps - Seated Piriformis Stretch with Trunk Bend  - 1 x daily - 7 x weekly - 1 sets - 2 reps - 30 sec hold - Seated Happy Baby With Trunk Flexion For Pelvic Relaxation  - 1 x daily - 7 x weekly - 1 sets - 1 reps - 30 sec hold - Supine Piriformis Stretch with Leg Straight  - 1 x daily - 7 x weekly - 1 sets - 2 reps - 30 sec  hold - Hooklying Transversus Abdominis Palpation  - 1 x daily - 3 x weekly - 1 sets - 10 reps - Hooklying Isometric Hip Flexion  - 1 x daily - 3 x weekly - 1 sets - 10 reps - Quadruped Pelvic Floor Contraction with Opposite Arm and Leg Lift  - 1 x daily - 1 x weekly - 2 sets - 10 reps - Posterior Chain Stretch  - 1 x daily - 3 x weekly - 1 sets - 5 reps - Butterfly Groin Stretch  - 1 x daily - 3 x weekly - 1 sets - 1 reps - 30 sec hold - Quadruped Yoga Block Lift Off  - 1 x daily - 3 x weekly - 1 sets - 10 reps   ASSESSMENT:  CLINICAL IMPRESSION: Patient is a 74 y.o. female who was seen today for physical therapy  treatment for pelvis floor dysfunction.  Patient is using the third dilator with stretching feeling but no pain. Patient has had a drop of urine once getting to the commode. Patient was able to drive 3.5 hours without stopping.  Patient pelvic floor strength is 3/5 holding for 3 seconds. She has difficulty with relaxation of the pelvic floor. She was instructed to use the vaginal dilators more often to elongate the tissue.  Patient will benefit from skilled therapy to improve pelvic floor tissue health, elongation of the vaginal canal and working on coordination to reduce urgency.   OBJECTIVE IMPAIRMENTS: decreased coordination, decreased ROM, decreased strength, increased fascial restrictions, increased muscle spasms, impaired tone, and pain.   ACTIVITY  LIMITATIONS: continence, toileting, and locomotion level  PARTICIPATION LIMITATIONS: interpersonal relationship, driving, shopping, and community activity  PERSONAL FACTORS: Fitness, Time since onset of injury/illness/exacerbation, and 1-2 comorbidities: Hypothyroid; Hypertension; Cesarean section are also affecting patient's functional outcome.   REHAB POTENTIAL: Good  CLINICAL DECISION MAKING: Evolving/moderate complexity  EVALUATION COMPLEXITY: Moderate   GOALS: Goals reviewed with patient? Yes  SHORT TERM GOALS: Target date: 06/22/23  Patient educated on vaginal moisturizers and understands how they are used and why to improve the health of the vaginal tissue.  Baseline: Goal status: Met 05/29/23  2.  Patient is able to perform diaphragmatic breathing to elongate her pelvic floor.  Baseline:  Goal status: met 07/24/23  3.  Patient understands was to perform perineal massage externally to start to relax the pelvic floor tissue.  Baseline:  Goal status: Met 07/31/23  4.  Patient is independent with flexibility exercises to indirectly elongate the pelvic floor.  Baseline:  Goal status: Met 07/24/23  5.  Patient educated on vaginal dilators to assist with the expansion of the pelvic floor.  Baseline:  Goal status: met 08/16/23  LONG TERM GOALS: Target date: 01/27/24  Patient independent with advanced HEP to increase strength of core and pelvic floor.  Baseline:  Goal status: ongoing 10/27/23  2.  Patient is able to use the dilator that is appropriate for her goal to have vaginal penetration with pain level </= 0-2/10.  Baseline: Patient is on the 3rd one Goal status: ongoing 12/18/23  3.  Patient pelvic floor strength increased to reduce urinary leakage with urgency.  Baseline:  Goal status: ongoing 10/27/23  4.  Patient is able to use the behavioral technique to reduce the urge to void so she is able to slowly walk to the bathroom without leaking urine and enter her house.   Baseline:  Goal status: ongoing 12/18/23  5.  Patient reports she is able to urinate  1-2 times at night due to using the urge to void technique.  Baseline:  Goal status: Met 08/07/23   PLAN:  PT FREQUENCY: 1-2x/week  PT DURATION: 12 weeks  PLANNED INTERVENTIONS: 97110-Therapeutic exercises, 97530- Therapeutic activity, 97112- Neuromuscular re-education, 97535- Self Care, [id number]- Manual therapy, G0283- Electrical stimulation (unattended), 97035- Ultrasound, Patient/Family education, Dry Needling, Joint mobilization, Spinal mobilization, Scar mobilization, Cryotherapy, Moist heat, and Biofeedback  PLAN FOR NEXT SESSION:  internal work to pelvic floor  on the right, work on SI joint mobility, hip mobility and lumbar mobility, lower abdominal contraction   Channing Pereyra, PT 12/18/23 3:33 PM

## 2023-12-21 ENCOUNTER — Encounter: Payer: Self-pay | Admitting: *Deleted

## 2023-12-25 ENCOUNTER — Ambulatory Visit: Attending: Internal Medicine | Admitting: Internal Medicine

## 2023-12-25 ENCOUNTER — Encounter: Payer: Self-pay | Admitting: Radiology

## 2023-12-25 ENCOUNTER — Ambulatory Visit (INDEPENDENT_AMBULATORY_CARE_PROVIDER_SITE_OTHER)
Admission: RE | Admit: 2023-12-25 | Discharge: 2023-12-25 | Disposition: A | Discharge status: Home / Self Care | Source: Ambulatory Visit | Attending: Family Medicine | Admitting: Family Medicine

## 2023-12-25 ENCOUNTER — Encounter: Admitting: Physical Therapy

## 2023-12-25 VITALS — BP 162/70 | HR 85 | Ht 62.0 in | Wt 153.0 lb

## 2023-12-25 DIAGNOSIS — M85852 Other specified disorders of bone density and structure, left thigh: Secondary | ICD-10-CM

## 2023-12-25 DIAGNOSIS — R030 Elevated blood-pressure reading, without diagnosis of hypertension: Secondary | ICD-10-CM | POA: Diagnosis not present

## 2023-12-25 DIAGNOSIS — R0989 Other specified symptoms and signs involving the circulatory and respiratory systems: Secondary | ICD-10-CM

## 2023-12-25 DIAGNOSIS — E785 Hyperlipidemia, unspecified: Secondary | ICD-10-CM | POA: Insufficient documentation

## 2023-12-25 DIAGNOSIS — K551 Chronic vascular disorders of intestine: Secondary | ICD-10-CM

## 2023-12-25 DIAGNOSIS — F419 Anxiety disorder, unspecified: Secondary | ICD-10-CM

## 2023-12-25 DIAGNOSIS — Z780 Asymptomatic menopausal state: Secondary | ICD-10-CM | POA: Diagnosis not present

## 2023-12-25 DIAGNOSIS — I700 Atherosclerosis of aorta: Secondary | ICD-10-CM | POA: Diagnosis not present

## 2023-12-25 DIAGNOSIS — G4733 Obstructive sleep apnea (adult) (pediatric): Secondary | ICD-10-CM

## 2023-12-25 DIAGNOSIS — E559 Vitamin D deficiency, unspecified: Secondary | ICD-10-CM

## 2023-12-25 DIAGNOSIS — I10 Essential (primary) hypertension: Secondary | ICD-10-CM | POA: Insufficient documentation

## 2023-12-25 DIAGNOSIS — I701 Atherosclerosis of renal artery: Secondary | ICD-10-CM | POA: Diagnosis not present

## 2023-12-25 NOTE — Patient Instructions (Addendum)
 Medication Instructions:  No Changes  Follow-Up: At [facility], you and your health needs are our priority.  As part of our continuing mission to provide you with exceptional heart care, our providers are all part of one team.  This team includes your primary Cardiologist (physician) and Advanced Practice Providers or APPs (Physician Assistants and Nurse Practitioners) who all work together to provide you with the care you need, when you need it.  Your next appointment:    We have referred you to the [facility]; they will call you to make a 6 month appointment (due May 2026) with either Dr. Annabella Scarce or Reche Finder, NP, then please plan to see Dr. Loni in November 2026   1 year(s)  Provider:   Soyla DELENA Loni, MD    Other Instructions Please call us  or send a MyChart message with any Cardiology related questions/concerns.  [phone].  Thank you!

## 2023-12-25 NOTE — Progress Notes (Signed)
 Cardiology Office Note:  .   Date:  12/25/2023  ID:  Diana Wolfe, DOB 01/06/1951, MRN [id number] PCP: Alvia Corean CROME, FNP  [facility] Providers Cardiologist:  Soyla DELENA Merck, MD    History of Present Illness: .   Diana Wolfe is a 73 y.o. female.  Discussed the use of AI scribe software for clinical note transcription with the patient, who gave verbal consent to proceed.  History of Present Illness Diana Wolfe is a 73 year old female with hypertension and sleep apnea who presents with concerns about blood pressure management and CPAP intolerance.  She experiences fluctuating blood pressure, with normal readings at home, such as 127/71 mmHg, but significant elevations during medical visits, reaching up to 200/100 mmHg. After a recent ENT visit, her blood pressure was 196/100 mmHg and increased to 200/100 mmHg after 20 minutes. At home, it remained elevated at 180/100 mmHg for a couple of hours post-visit. She has not used hydralazine , as her blood pressure does not exceed 190 mmHg for more than an hour outside medical visits.  She finds the CPAP machine intolerable and qualifies for the [facility] device, which may eliminate the need for CPAP. Elevated blood pressure during medical visits has delayed scheduling the procedure recommended by the ENT.  Her current medications include Adderall 10 mg, essential for attention and work, aspirin 81 mg three times a week, and Synthroid . She avoids regular antihypertensive medication due to episodes of low blood pressure, with readings as low as 100/62 mmHg causing significant symptoms.  She has a long-standing history of sleep apnea, poorly controlled, and notes blood pressure issues began post-COVID-19. She lives alone and is concerned about risks associated with fluctuating blood pressure and potential low blood pressure during sedation for procedures.    ROS: negative except per HPI above.  Studies Reviewed: SABRA    EKG Interpretation Date/Time:  Monday December 25 2023 09:37:44 EST Ventricular Rate:  85 PR Interval:  138 QRS Duration:  74 QT Interval:  372 QTC Calculation: 442 R Axis:   30  Text Interpretation: Normal sinus rhythm Normal ECG When compared with ECG of 30-Jan-2023 13:32, No significant change was found Confirmed by Merck Soyla ([id number]) on 12/25/2023 9:57:28 AM    Results DIAGNOSTIC EKG: Normal Risk Assessment/Calculations:       Physical Exam:   VS:  BP (!) 162/70 (BP Location: Right Arm, Patient Position: Sitting, Cuff Size: Normal)   Pulse 85   Ht 5' 2 (1.575 m)   Wt 153 lb (69.4 kg)   SpO2 95%   BMI 27.98 kg/m    Wt Readings from Last 3 Encounters:  12/25/23 153 lb (69.4 kg)  11/29/23 156 lb (70.8 kg)  11/20/23 155 lb (70.3 kg)     Physical Exam VITALS: BP- 162/ GENERAL: Alert, cooperative, well developed, no acute distress HEENT: Normocephalic, normal oropharynx, moist mucous membranes CHEST: Clear to auscultation bilaterally, no wheezes, rhonchi, or crackles CARDIOVASCULAR: Normal heart rate and rhythm, S1 and S2 normal without murmurs ABDOMEN: Soft, non-tender, non-distended, without organomegaly, normal bowel sounds EXTREMITIES: No cyanosis or edema NEUROLOGICAL: Cranial nerves grossly intact, moves all extremities without gross motor or sensory deficit   ASSESSMENT AND PLAN: .    Assessment and Plan Assessment & Plan Labile blood pressure (white coat hypertension, episodic hypertension, orthostatic hypotension) Labile blood pressure with white coat hypertension and orthostatic hypotension. Clinical readings higher than home. Adderall may elevate clinical readings. Daily antihypertensive therapy contraindicated due to symptomatic hypotension. -  Avoid Adderall when able to evaluate impact on BP, particularly if upcoming procedure or appt. - Avoid caffeine, hydrate before procedures. - Document white coat hypertension for procedural planning. -  Follow-up with hypertension clinic in six months.  Obstructive sleep apnea, CPAP intolerance, candidate for Inspire device Obstructive sleep apnea with CPAP intolerance. Candidate for Inspire device pending blood pressure control. Sleep apnea may contribute to labile blood pressure. - Optimize blood pressure for Inspire procedure. Would avoid stimulants and caffeine prior to procedure to assess if BP improved in office. BP usually lower at home, consistent with [facility].  Attention deficit disorder, adult, on stimulant therapy Adult ADD managed with Adderall 10 mg, beneficial for attention and work. May elevate clinical blood pressure readings. - Avoid Adderall on appointment days to evaluate impact on elevated readings.  Hypothyroidism, on levothyroxine  Hypothyroidism managed with Synthroid .   Recording duration: 23 minutes      Rylee Huestis, MD, FACC

## 2023-12-26 DIAGNOSIS — H40003 Preglaucoma, unspecified, bilateral: Secondary | ICD-10-CM | POA: Diagnosis not present

## 2024-01-01 ENCOUNTER — Ambulatory Visit: Admitting: Physical Therapy

## 2024-01-03 ENCOUNTER — Ambulatory Visit: Attending: Family Medicine | Admitting: Physical Therapy

## 2024-01-03 ENCOUNTER — Encounter: Payer: Self-pay | Admitting: Physical Therapy

## 2024-01-03 DIAGNOSIS — R278 Other lack of coordination: Secondary | ICD-10-CM | POA: Insufficient documentation

## 2024-01-03 DIAGNOSIS — R102 Pelvic and perineal pain unspecified side: Secondary | ICD-10-CM | POA: Insufficient documentation

## 2024-01-03 DIAGNOSIS — R252 Cramp and spasm: Secondary | ICD-10-CM | POA: Diagnosis not present

## 2024-01-03 NOTE — Therapy (Signed)
 OUTPATIENT PHYSICAL THERAPY FEMALE PELVIC TREATMENT   Patient Name: Diana Wolfe MRN: [id number] DOB:06/18/1950, 73 y.o., female Today's Date: 01/03/2024     END OF SESSION:  PT End of Session - 01/03/24 0847     Visit Number 13    Date for Recertification  02/01/24    Authorization Type Medicare/ [employer]    Authorization - Visit Number 13    Authorization - Number of Visits 20    PT Start Time 0845    PT Stop Time 0925    PT Time Calculation (min) 40 min    Activity Tolerance Patient tolerated treatment well    Behavior During Therapy [facility] for tasks assessed/performed          Past Medical History:  Diagnosis Date   ADD (attention deficit disorder)    Allergy 1988   Anemia    Anxiety    Arthritis 2009   Depression    GERD (gastroesophageal reflux disease)    Hyperlipidemia    Hypertension    Hypothyroid    Osteoporosis    PONV (postoperative nausea and vomiting)    Sleep apnea    using cpap since 2025   Vitamin D  deficiency    Past Surgical History:  Procedure Laterality Date   BREAST BIOPSY Left 07/09/2019   CESAREAN SECTION     ESOPHAGOGASTRODUODENOSCOPY N/A 09/02/2015   Procedure: ESOPHAGOGASTRODUODENOSCOPY (EGD);  Surgeon: Lynwood Bohr, MD;  Location: [facility] ENDOSCOPY;  Service: Endoscopy;  Laterality: N/A;   ESOPHAGOGASTRODUODENOSCOPY N/A 06/05/2022   Procedure: ESOPHAGOGASTRODUODENOSCOPY (EGD);  Surgeon: Burnette Fallow, MD;  Location: [facility];  Service: Gastroenterology;  Laterality: N/A;   FOOT SURGERY Right    FOREIGN BODY REMOVAL N/A 06/05/2022   Procedure: FOREIGN BODY REMOVAL;  Surgeon: Burnette Fallow, MD;  Location: [facility] ENDOSCOPY;  Service: Gastroenterology;  Laterality: N/A;   FRACTURE SURGERY     IM NAILING TIBIA Left 01/09/2001   Dr. 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   Dysphagia 04/21/2020   Generalized abdominal pain 04/21/2020   Hematochezia 04/21/2020   Left lower quadrant pain 04/21/2020   History of colonic polyps 04/21/2020   Urticaria 04/21/2020   B12 deficiency 01/31/2019   Medication management 06/23/2014   Vaginal atrophy 03/27/2014   Hyperlipidemia    Hypertension    Anxiety    GERD (gastroesophageal reflux disease)    ADD (attention deficit disorder)    Depression    Vitamin D  deficiency    Hypothyroidism 04/25/2008   Iron deficiency anemia 04/25/2008   Obstructive sleep apnea 11/16/2007   Allergic rhinitis 11/16/2007   Headache 11/16/2007   PULMONARY EMBOLISM, HX OF 11/16/2007    PCP: Alvia Corean CROME, FNP  REFERRING PROVIDER: Alvia Corean CROME, FNP   REFERRING DIAG:  [phone] (ICD-10-CM) - Medication management  M62.89 (ICD-10-CM) - Pelvic floor dysfunction in female    THERAPY DIAG:  Cramp and spasm  Other lack of coordination  Pelvic pain  Rationale for Evaluation and Treatment: Rehabilitation  ONSET DATE: 2021  SUBJECTIVE:  SUBJECTIVE STATEMENT: I am on the 4th dilator now. I do not know if it is the lubricant and get some irritation.   PAIN:  Are you having pain? Yes NPRS scale: 7/10 Pain location: Vaginal  Pain type: aching Pain description: intermittent   Aggravating factors: during vaginal penetration Relieving factors: no vaginal penetration  PRECAUTIONS: None  RED FLAGS: None   WEIGHT BEARING RESTRICTIONS: No  FALLS:  Has patient fallen in last 6 months?  Yes. Number of falls 1 time slipped on ice.   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  URINATION: Pain with urination: No Fully empty bladder: Yes: sometimes will empty her bladder and 10 minutes have to urinate again Stream: Strong Urgency: Yes , getting out of car, sitting in house 08/07/23: feels the urgency after driving more than 1 hour; no urgency with sitting in house.  Frequency: night time 2-3 times: daytime: 6 times 07/31/23: wake up 2 times at night 08/07/23: wake up 1 time to urinate.  Leakage: Urge to void and Walking to the bathroom 08/07/23: urinary leakage 1 time since last week when she got out of the car and rushed to the bathroom Pads: No  INTERCOURSE:not sexually active at this time but in the future would like to be able to.    PREGNANCY: C-section deliveries 1  PROLAPSE: None   OBJECTIVE:  Note: Objective measures were completed at Evaluation unless otherwise noted.  DIAGNOSTIC FINDINGS:  none  PATIENT SURVEYS:  UIQ-7: 48 PFIQ-7: 44 08/16/23: UIQ-7: 33 PFIQ-7: 28 10/27/23: UIQ-7: 48 PFIQ-7: 44  COGNITION: Overall cognitive status: Within functional limits for tasks assessed     SENSATION: Light touch: Appears intact   POSTURE: decreased lumbar lordosis   LUMBARAROM/PROM:  A/PROM A/PROM  eval A/PROM  10/27/23  Extension Decreased by 50% full   (Blank rows = not tested)  LOWER EXTREMITY ROM: bilateral hip ROM is full   LOWER EXTREMITY MMT:  MMT Right eval Left eval Right 10/27/23 Left 10/27/23  Hip extension 4/5 4/5 5/5 5/5  Hip abduction 4/5 5/5 5/5 5/5   (Blank rows = not tested) PALPATION:    Pelvic Alignment: ASIS  Abdominal: decreased mobility of  the tissue below the scar, decreased movement of the diaphragm, difficulty with opening her lower rib cage                External Perineal Exam: dry, redness along the vulva, white patch of skin along the inner left labia majora; Q-tip test was positive at 6, 11 and 1 O'Clock.                              Internal Pelvic Floor: pain with Q-tip therefore did not assess any further, when she bulged therapist did not see any prolapse, the vaginal canal was small  Patient confirms identification and approves PT to assess internal pelvic floor and treatment Yes  PELVIC MMT:not tested due to the pain   MMT 07/31/23 12/18/23  Vaginal 2/5 3/5 4 s  Internal Anal Sphincter    External Anal Sphincter    Puborectalis    Diastasis Recti    (Blank rows = not tested)  TODAY'S TREATMENT:  01/03/24 Manual: Myofascial release: Fascial release of the mons pubis, round ligament, clitoris going through the restrictiona Internal pelvic floor techniques: No emotional/communication barriers or cognitive limitation. Patient is motivated to learn. Patient understands and agrees with treatment goals and plan. PT explains patient will be examined in standing, sitting, and lying down to see how their muscles and joints work. When they are ready, they will be asked to remove their underwear so PT can examine their perineum. The patient is also given the option of providing their own chaperone as one is not provided in our facility. The patient also has the right and is explained the right to defer or refuse any part of the evaluation or treatment including the internal exam. With the patient's consent, PT will use one gloved finger to gently assess the muscles of the pelvic floor, seeing how well it contracts and relaxes and if there is muscle symmetry. After, the patient will get dressed and PT and patient will discuss exam findings and plan of care. PT and patient discuss plan of care, schedule, attendance  policy and HEP activities.  Therapist gloved finger in the vaginal canal releasing around the urethra and bladder to reduce urgency Neuromuscular re-education: Down training: Home TENS unit with one electrode behind the left malleolus and one on the medial superior arch for 20 minutes; Pulse width is 200-250 usec Frequency 10-12 HZ for 20 minutes and educated patient on how to perform at home.  ESelf-care: Discussed patient trying another lubricant to see if that is the irritant and gave her 2 different samples.     12/18/23 Manual: Soft tissue mobilization: Manual work to the ischiocavernosus, urogenital diaphragm, superior transverse perineum Internal pelvic floor techniques: No emotional/communication barriers or cognitive limitation. Patient is motivated to learn. Patient understands and agrees with treatment goals and plan. PT explains patient will be examined in standing, sitting, and lying down to see how their muscles and joints work. When they are ready, they will be asked to remove their underwear so PT can examine their perineum. The patient is also given the option of providing their own chaperone as one is not provided in our facility. The patient also has the right and is explained the right to defer or refuse any part of the evaluation or treatment including the internal exam. With the patient's consent, PT will use one gloved finger to gently assess the muscles of the pelvic floor, seeing how well it contracts and relaxes and if there is muscle symmetry. After, the patient will get dressed and PT and patient will discuss exam findings and plan of care. PT and patient discuss plan of care, schedule, attendance policy and HEP activities.  Therapist gloved finger working on the introitus to expand slowly and elongate the tissue to prepare for a larger dilator Self-care: Therapist discussed with patient on using her dilator more often, 3 days instead of 2 Therapist discussed with  patient to move the dilator around to increase the introitus.  Therapist gave patient more samples of the uber lube to use with her dilators    11/27/23 Manual: Scar tissue mobilization: Manual work to the scar with going through the restrictions and pulling up the scar Manual work above and below scare to elongate the tissue Neuromuscular re-education: Core retraining: Transverse abdominus 10 x Transverse abdominus contraction with curl up.  Supine trunk rotation engaging the core and pelvic floor 15 x each way Supine bilateral shoulder extension with green band and squeeze yoga  block Self-care: Educated patient on the muscles of the pelvic floor and how the vaginal trainer expands the muscles and length of the vaginal canal. Educated patient on doing the vaginal trainer 3 times per week     PATIENT EDUCATION:  09/15/23 Education details: Access Code: [email or web address], abdominal scar massage Person educated: Patient Education method: Explanation, Demonstration, Tactile cues, Verbal cues, and Handouts Education comprehension: verbalized understanding, returned demonstration, verbal cues required, tactile cues required, and needs further education  HOME EXERCISE PROGRAM: 09/15/23 Access Code: [email or web address] URL: [email or web address] Date: 09/15/2023 Prepared by: Channing Pereyra  Program Notes sit on ball and massage the pelvic floor daily for several minutes.   Exercises - Seated Diaphragmatic Breathing  - 1 x daily - 7 x weekly - 3 sets - 10 reps - Seated Piriformis Stretch with Trunk Bend  - 1 x daily - 7 x weekly - 1 sets - 2 reps - 30 sec hold - Seated Happy Baby With Trunk Flexion For Pelvic Relaxation  - 1 x daily - 7 x weekly - 1 sets - 1 reps - 30 sec hold - Supine Piriformis Stretch with Leg Straight  - 1 x daily - 7 x weekly - 1 sets - 2 reps - 30 sec hold - Hooklying Transversus Abdominis Palpation  - 1 x daily - 3 x weekly - 1 sets - 10 reps - Hooklying Isometric Hip  Flexion  - 1 x daily - 3 x weekly - 1 sets - 10 reps - Quadruped Pelvic Floor Contraction with Opposite Arm and Leg Lift  - 1 x daily - 1 x weekly - 2 sets - 10 reps - Posterior Chain Stretch  - 1 x daily - 3 x weekly - 1 sets - 5 reps - Butterfly Groin Stretch  - 1 x daily - 3 x weekly - 1 sets - 1 reps - 30 sec hold - Quadruped Yoga Block Lift Off  - 1 x daily - 3 x weekly - 1 sets - 10 reps   ASSESSMENT:  CLINICAL IMPRESSION: Patient is a 73 y.o. female who was seen today for physical therapy  treatment for pelvis floor dysfunction.  Patient continues to have urinary urgency. She was instructed on how to use the home TENS unit on the TTNS protocol. She will do this at home 30 minutes per day to see if this helps. She had restrictions around the MONS Pubis, Clitoris, round ligament, urethra and bladder.   Patient will benefit from skilled therapy to improve pelvic floor tissue health, elongation of the vaginal canal and working on coordination to reduce urgency.   OBJECTIVE IMPAIRMENTS: decreased coordination, decreased ROM, decreased strength, increased fascial restrictions, increased muscle spasms, impaired tone, and pain.   ACTIVITY LIMITATIONS: continence, toileting, and locomotion level  PARTICIPATION LIMITATIONS: interpersonal relationship, driving, shopping, and community activity  PERSONAL FACTORS: Fitness, Time since onset of injury/illness/exacerbation, and 1-2 comorbidities: Hypothyroid; Hypertension; Cesarean section are also affecting patient's functional outcome.   REHAB POTENTIAL: Good  CLINICAL DECISION MAKING: Evolving/moderate complexity  EVALUATION COMPLEXITY: Moderate   GOALS: Goals reviewed with patient? Yes  SHORT TERM GOALS: Target date: 06/22/23  Patient educated on vaginal moisturizers and understands how they are used and why to improve the health of the vaginal tissue.  Baseline: Goal status: Met 05/29/23  2.  Patient is able to perform diaphragmatic  breathing to elongate her pelvic floor.  Baseline:  Goal status: met 07/24/23  3.  Patient understands was to perform perineal massage externally to  start to relax the pelvic floor tissue.  Baseline:  Goal status: Met 07/31/23  4.  Patient is independent with flexibility exercises to indirectly elongate the pelvic floor.  Baseline:  Goal status: Met 07/24/23  5.  Patient educated on vaginal dilators to assist with the expansion of the pelvic floor.  Baseline:  Goal status: met 08/16/23  LONG TERM GOALS: Target date: 01/27/24  Patient independent with advanced HEP to increase strength of core and pelvic floor.  Baseline:  Goal status: ongoing 10/27/23  2.  Patient is able to use the dilator that is appropriate for her goal to have vaginal penetration with pain level </= 0-2/10.  Baseline: Patient is on the 3rd one Goal status: ongoing 12/18/23  3.  Patient pelvic floor strength increased to reduce urinary leakage with urgency.  Baseline:  Goal status: ongoing 10/27/23  4.  Patient is able to use the behavioral technique to reduce the urge to void so she is able to slowly walk to the bathroom without leaking urine and enter her house.  Baseline:  Goal status: ongoing 12/18/23  5.  Patient reports she is able to urinate 1-2 times at night due to using the urge to void technique.  Baseline:  Goal status: Met 08/07/23   PLAN:  PT FREQUENCY: 1-2x/week  PT DURATION: 12 weeks  PLANNED INTERVENTIONS: 97110-Therapeutic exercises, 97530- Therapeutic activity, 97112- Neuromuscular re-education, 97535- Self Care, [id number]- Manual therapy, [id number]- Electrical stimulation (unattended), 97035- Ultrasound, Patient/Family education, Dry Needling, Joint mobilization, Spinal mobilization, Scar mobilization, Cryotherapy, Moist heat, and Biofeedback  PLAN FOR NEXT SESSION:  internal work to pelvic floor  on the right, work on SI joint mobility, hip mobility and lumbar mobility, lower abdominal contraction;  assess the home TENS unit   Channing Pereyra, PT 01/03/24 9:32 AM

## 2024-01-03 NOTE — Patient Instructions (Signed)
 TTNS Electrode on the left foot Pulse width is 200-250 usec Frequency 10-12 HZ 30 minutes per day  [facility] [address], [address] [place], [place] [address] Phone # [phone] Fax [phone]

## 2024-01-04 DIAGNOSIS — H40003 Preglaucoma, unspecified, bilateral: Secondary | ICD-10-CM | POA: Diagnosis not present

## 2024-01-05 ENCOUNTER — Encounter: Payer: Self-pay | Admitting: Internal Medicine

## 2024-01-10 ENCOUNTER — Encounter (HOSPITAL_BASED_OUTPATIENT_CLINIC_OR_DEPARTMENT_OTHER): Admitting: Cardiovascular Disease

## 2024-01-15 ENCOUNTER — Ambulatory Visit: Admitting: Physical Therapy

## 2024-01-15 ENCOUNTER — Encounter: Payer: Self-pay | Admitting: Physical Therapy

## 2024-01-15 DIAGNOSIS — R102 Pelvic and perineal pain unspecified side: Secondary | ICD-10-CM

## 2024-01-15 DIAGNOSIS — R252 Cramp and spasm: Secondary | ICD-10-CM | POA: Diagnosis not present

## 2024-01-15 DIAGNOSIS — R278 Other lack of coordination: Secondary | ICD-10-CM | POA: Diagnosis not present

## 2024-01-15 NOTE — Therapy (Signed)
 OUTPATIENT PHYSICAL THERAPY FEMALE PELVIC TREATMENT   Patient Name: Diana Wolfe MRN: [id number] DOB:1950-11-26, 73 y.o., female Today's Date: 01/15/2024     END OF SESSION:  PT End of Session - 01/15/24 1018     Visit Number 14    Date for Recertification  02/01/24    Authorization - Visit Number 14    Authorization - Number of Visits 20    PT Start Time 1015    PT Stop Time 1055    PT Time Calculation (min) 40 min    Activity Tolerance Patient tolerated treatment well    Behavior During Therapy [facility] for tasks assessed/performed          Past Medical History:  Diagnosis Date   ADD (attention deficit disorder)    Allergy 1988   Anemia    Anxiety    Arthritis 2009   Depression    GERD (gastroesophageal reflux disease)    Hyperlipidemia    Hypertension    Hypothyroid    Osteoporosis    PONV (postoperative nausea and vomiting)    Sleep apnea    using cpap since 2025   Vitamin D  deficiency    Past Surgical History:  Procedure Laterality Date   BREAST BIOPSY Left 07/09/2019   CESAREAN SECTION     ESOPHAGOGASTRODUODENOSCOPY N/A 09/02/2015   Procedure: ESOPHAGOGASTRODUODENOSCOPY (EGD);  Surgeon: Lynwood Bohr, MD;  Location: [facility] ENDOSCOPY;  Service: Endoscopy;  Laterality: N/A;   ESOPHAGOGASTRODUODENOSCOPY N/A 06/05/2022   Procedure: ESOPHAGOGASTRODUODENOSCOPY (EGD);  Surgeon: Burnette Fallow, MD;  Location: [facility];  Service: Gastroenterology;  Laterality: N/A;   FOOT SURGERY Right    FOREIGN BODY REMOVAL N/A 06/05/2022   Procedure: FOREIGN BODY REMOVAL;  Surgeon: Burnette Fallow, MD;  Location: [facility] ENDOSCOPY;  Service: Gastroenterology;  Laterality: N/A;   FRACTURE SURGERY     IM NAILING TIBIA Left 01/09/2001   Dr. 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   Dysphagia 04/21/2020   Generalized abdominal pain 04/21/2020   Hematochezia 04/21/2020   Left lower quadrant pain 04/21/2020   History of colonic polyps 04/21/2020   Urticaria 04/21/2020   B12 deficiency 01/31/2019   Medication management 06/23/2014   Vaginal atrophy 03/27/2014   Hyperlipidemia    Hypertension    Anxiety    GERD (gastroesophageal reflux disease)    ADD (attention deficit disorder)    Depression    Vitamin D  deficiency    Hypothyroidism 04/25/2008   Iron deficiency anemia 04/25/2008   Obstructive sleep apnea 11/16/2007   Allergic rhinitis 11/16/2007   Headache 11/16/2007   PULMONARY EMBOLISM, HX OF 11/16/2007    PCP: Alvia Corean CROME, FNP  REFERRING PROVIDER: Alvia Corean CROME, FNP   REFERRING DIAG:  [phone] (ICD-10-CM) - Medication management  M62.89 (ICD-10-CM) - Pelvic floor dysfunction in female    THERAPY DIAG:  Cramp and spasm  Other lack of coordination  Pelvic pain  Rationale for Evaluation and Treatment: Rehabilitation  ONSET DATE: 2021  SUBJECTIVE:  SUBJECTIVE STATEMENT: I am on the 4th dilator but stopped due to discomfort.    PAIN:  Are you having pain? Yes NPRS scale: 7/10 Pain location: Vaginal  Pain type: aching Pain description: intermittent   Aggravating factors: during vaginal penetration Relieving factors: no vaginal penetration  PRECAUTIONS: None  RED FLAGS: None   WEIGHT BEARING RESTRICTIONS: No  FALLS:  Has patient fallen in last 6 months? Yes. Number of falls 1 time slipped on ice.   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  URINATION: Pain with urination: No Fully empty bladder: Yes: sometimes will empty her bladder and 10 minutes have to urinate again Stream: Strong Urgency: Yes , getting out of car, sitting in house 08/07/23: feels the urgency after driving more than 1 hour; no urgency with sitting in house.  Frequency: night time 2-3 times: daytime: 6 times 07/31/23: wake up 2 times at night 08/07/23: wake up 1 time to urinate.  Leakage: Urge to void and Walking to the bathroom 08/07/23: urinary leakage 1 time since last week when she got out of the car and rushed to the bathroom Pads: No  INTERCOURSE:not sexually active at this time but in the future would like to be able to.    PREGNANCY: C-section deliveries 1  PROLAPSE: None   OBJECTIVE:  Note: Objective measures were completed at Evaluation unless otherwise noted.  DIAGNOSTIC FINDINGS:  none  PATIENT SURVEYS:  UIQ-7: 48 PFIQ-7: 44 08/16/23: UIQ-7: 33 PFIQ-7: 28 10/27/23: UIQ-7: 48 PFIQ-7: 44  COGNITION: Overall cognitive status: Within functional limits for tasks assessed     SENSATION: Light touch: Appears intact   POSTURE: decreased lumbar lordosis   LUMBARAROM/PROM:  A/PROM A/PROM  eval A/PROM  10/27/23  Extension Decreased by 50% full   (Blank rows = not tested)  LOWER EXTREMITY ROM: bilateral hip ROM is full   LOWER EXTREMITY MMT:  MMT Right eval Left eval Right 10/27/23 Left 10/27/23  Hip extension 4/5 4/5 5/5 5/5  Hip abduction 4/5 5/5 5/5 5/5   (Blank rows = not tested) PALPATION:    Pelvic Alignment: ASIS  Abdominal: decreased mobility of the tissue below the scar, decreased movement of the diaphragm,  difficulty with opening her lower rib cage                External Perineal Exam: dry, redness along the vulva, white patch of skin along the inner left labia majora; Q-tip test was positive at 6, 11 and 1 O'Clock.                              Internal Pelvic Floor: pain with Q-tip therefore did not assess any further, when she bulged therapist did not see any prolapse, the vaginal canal was small  Patient confirms identification and approves PT to assess internal pelvic floor and treatment Yes  PELVIC MMT:not tested due to the pain   MMT 07/31/23 12/18/23 01/15/24  Vaginal 2/5 3/5 4 s 4/5 2 s  Internal Anal Sphincter     External Anal Sphincter     Puborectalis     Diastasis Recti     (Blank rows = not tested)  TODAY'S TREATMENT:  01/15/24 Manual: Internal pelvic floor techniques: No emotional/communication barriers or cognitive limitation. Patient is motivated to learn. Patient understands and agrees with treatment goals and plan. PT explains patient will be examined in standing, sitting, and lying down to see how their muscles and joints work. When they are ready, they will be asked to remove their underwear so PT can examine their perineum. The patient is also given the option of providing their own chaperone as one is not provided in our facility. The patient also has the right and is explained the right to defer or refuse any part of the evaluation or treatment including the internal exam. With the patient's consent, PT will use one gloved finger to gently assess the muscles of the pelvic floor, seeing how well it contracts and relaxes and if there is muscle symmetry. After, the patient will get dressed and PT and patient will discuss exam findings and plan of care. PT and patient discuss plan of care, schedule, attendance policy and HEP activities.  Therapist gloved finger in the vaginal canal with releasing around the bladder, release around the clitoris, manual work around the  introitus elongating the tissue Neuromuscular re-education: Pelvic floor contraction training: Curl up with feet on mat and verbal cues to contract the abdominals Supine pull the green band to the side 15 x each way working the obliques Supine bilateral shoulder extension with green band 20 x Diaphragmatic breathing with pelvic drop Self-care: Discussed with patient on the home TENS unit, urinary urgency with caffeine, progressing with dilator    01/03/24 Manual: Myofascial release: Fascial release of the mons pubis, round ligament, clitoris going through the restrictiona Internal pelvic floor techniques: No emotional/communication barriers or cognitive limitation. Patient is motivated to learn. Patient understands and agrees with treatment goals and plan. PT explains patient will be examined in standing, sitting, and lying down to see how their muscles and joints work. When they are ready, they will be asked to remove their underwear so PT can examine their perineum. The patient is also given the option of providing their own chaperone as one is not provided in our facility. The patient also has the right and is explained the right to defer or refuse any part of the evaluation or treatment including the internal exam. With the patient's consent, PT will use one gloved finger to gently assess the muscles of the pelvic floor, seeing how well it contracts and relaxes and if there is muscle symmetry. After, the patient will get dressed and PT and patient will discuss exam findings and plan of care. PT and patient discuss plan of care, schedule, attendance policy and HEP activities.  Therapist gloved finger in the vaginal canal releasing around the urethra and bladder to reduce urgency Neuromuscular re-education: Down training: Home TENS unit with one electrode behind the left malleolus and one on the medial superior arch for 20 minutes; Pulse width is 200-250 usec Frequency 10-12 HZ for 20 minutes  and educated patient on how to perform at home.  ESelf-care: Discussed patient trying another lubricant to see if that is the irritant and gave her 2 different samples.     12/18/23 Manual: Soft tissue mobilization: Manual work to the ischiocavernosus, urogenital diaphragm, superior transverse perineum Internal pelvic floor techniques: No emotional/communication barriers or cognitive limitation. Patient is motivated to learn. Patient understands and agrees with treatment goals and plan. PT explains patient will be examined in standing, sitting, and lying down to see how their muscles and  joints work. When they are ready, they will be asked to remove their underwear so PT can examine their perineum. The patient is also given the option of providing their own chaperone as one is not provided in our facility. The patient also has the right and is explained the right to defer or refuse any part of the evaluation or treatment including the internal exam. With the patient's consent, PT will use one gloved finger to gently assess the muscles of the pelvic floor, seeing how well it contracts and relaxes and if there is muscle symmetry. After, the patient will get dressed and PT and patient will discuss exam findings and plan of care. PT and patient discuss plan of care, schedule, attendance policy and HEP activities.  Therapist gloved finger working on the introitus to expand slowly and elongate the tissue to prepare for a larger dilator Self-care: Therapist discussed with patient on using her dilator more often, 3 days instead of 2 Therapist discussed with patient to move the dilator around to increase the introitus.  Therapist gave patient more samples of the uber lube to use with her dilators    PATIENT EDUCATION:  01/15/24 Education details: Access Code: [email or web address], abdominal scar massage Person educated: Patient Education method: Explanation, Demonstration, Tactile cues, Verbal cues, and  Handouts Education comprehension: verbalized understanding, returned demonstration, verbal cues required, tactile cues required, and needs further education  HOME EXERCISE PROGRAM: 01/15/24 Access Code: [email or web address] URL: [email or web address] Date: 01/15/2024 Prepared by: Channing Pereyra  Program Notes sit on ball and massage the pelvic floor daily for several minutes.   Exercises - Seated Diaphragmatic Breathing  - 1 x daily - 7 x weekly - 3 sets - 10 reps - Seated Piriformis Stretch with Trunk Bend  - 1 x daily - 7 x weekly - 1 sets - 2 reps - 30 sec hold - Seated Happy Baby With Trunk Flexion For Pelvic Relaxation  - 1 x daily - 7 x weekly - 1 sets - 1 reps - 30 sec hold - Supine Piriformis Stretch with Leg Straight  - 1 x daily - 7 x weekly - 1 sets - 2 reps - 30 sec hold - Quadruped Pelvic Floor Contraction with Opposite Arm and Leg Lift  - 1 x daily - 1 x weekly - 2 sets - 10 reps - Posterior Chain Stretch  - 1 x daily - 3 x weekly - 1 sets - 5 reps - Butterfly Groin Stretch  - 1 x daily - 3 x weekly - 1 sets - 1 reps - 30 sec hold - Quadruped Yoga Block Lift Off  - 1 x daily - 3 x weekly - 1 sets - 10 reps - Curl Up with Reach  - 1 x daily - 7 x weekly - 3 sets - 10 reps - Hooklying Anti-Rotation Press With Anchored Resistance  - 1 x daily - 7 x weekly - 3 sets - 10 reps - Shoulder Extension with Resistance - 90/90 on Swiss Ball  - 1 x daily - 3 x weekly - 2 sets - 10 reps   ASSESSMENT:  CLINICAL IMPRESSION: Patient is a 73 y.o. female who was seen today for physical therapy  treatment for pelvis floor dysfunction.  Patient has used the TENS unit and has less urgency. Patient is not having the spray of urine now. Patient reports she will have urgency when having caffeine.   Pelvic floor strength is increased to 4/5. Her pelvic floor muscles were tight initially but  after the manual work they relaxed. She was able to perform pelvic floor drop. Patient will benefit from  skilled therapy to improve pelvic floor tissue health, elongation of the vaginal canal and working on coordination to reduce urgency.   OBJECTIVE IMPAIRMENTS: decreased coordination, decreased ROM, decreased strength, increased fascial restrictions, increased muscle spasms, impaired tone, and pain.   ACTIVITY LIMITATIONS: continence, toileting, and locomotion level  PARTICIPATION LIMITATIONS: interpersonal relationship, driving, shopping, and community activity  PERSONAL FACTORS: Fitness, Time since onset of injury/illness/exacerbation, and 1-2 comorbidities: Hypothyroid; Hypertension; Cesarean section are also affecting patient's functional outcome.   REHAB POTENTIAL: Good  CLINICAL DECISION MAKING: Evolving/moderate complexity  EVALUATION COMPLEXITY: Moderate   GOALS: Goals reviewed with patient? Yes  SHORT TERM GOALS: Target date: 06/22/23  Patient educated on vaginal moisturizers and understands how they are used and why to improve the health of the vaginal tissue.  Baseline: Goal status: Met 05/29/23  2.  Patient is able to perform diaphragmatic breathing to elongate her pelvic floor.  Baseline:  Goal status: met 07/24/23  3.  Patient understands was to perform perineal massage externally to start to relax the pelvic floor tissue.  Baseline:  Goal status: Met 07/31/23  4.  Patient is independent with flexibility exercises to indirectly elongate the pelvic floor.  Baseline:  Goal status: Met 07/24/23  5.  Patient educated on vaginal dilators to assist with the expansion of the pelvic floor.  Baseline:  Goal status: met 08/16/23  LONG TERM GOALS: Target date: 01/27/24  Patient independent with advanced HEP to increase strength of core and pelvic floor.  Baseline:  Goal status: ongoing 10/27/23  2.  Patient is able to use the dilator that is appropriate for her goal to have vaginal penetration with pain level </= 0-2/10.  Baseline: Patient is on the 3rd one Goal status: ongoing  12/18/23  3.  Patient pelvic floor strength increased to reduce urinary leakage with urgency.  Baseline:  Goal status: ongoing 10/27/23  4.  Patient is able to use the behavioral technique to reduce the urge to void so she is able to slowly walk to the bathroom without leaking urine and enter her house.  Baseline:  Goal status: ongoing 12/18/23  5.  Patient reports she is able to urinate 1-2 times at night due to using the urge to void technique.  Baseline:  Goal status: Met 08/07/23   PLAN:  PT FREQUENCY: 1-2x/week  PT DURATION: 12 weeks  PLANNED INTERVENTIONS: 97110-Therapeutic exercises, 97530- Therapeutic activity, 97112- Neuromuscular re-education, 97535- Self Care, 02859- Manual therapy, [id number]- Electrical stimulation (unattended), 97035- Ultrasound, Patient/Family education, Dry Needling, Joint mobilization, Spinal mobilization, Scar mobilization, Cryotherapy, Moist heat, and Biofeedback  PLAN FOR NEXT SESSION:  internal work to pelvic floor  on the right, work on SI joint mobility, hip mobility and lumbar mobility, lower abdominal contraction; assess the home TENS unit   Channing Pereyra, PT 01/15/24 11:03 AM

## 2024-01-16 ENCOUNTER — Encounter: Payer: Self-pay | Admitting: Internal Medicine

## 2024-01-16 DIAGNOSIS — G4733 Obstructive sleep apnea (adult) (pediatric): Secondary | ICD-10-CM

## 2024-01-22 ENCOUNTER — Telehealth (HOSPITAL_BASED_OUTPATIENT_CLINIC_OR_DEPARTMENT_OTHER): Payer: Self-pay | Admitting: *Deleted

## 2024-01-22 ENCOUNTER — Encounter: Payer: Self-pay | Admitting: Physical Therapy

## 2024-01-22 ENCOUNTER — Encounter (HOSPITAL_BASED_OUTPATIENT_CLINIC_OR_DEPARTMENT_OTHER): Payer: Self-pay | Admitting: Cardiovascular Disease

## 2024-01-22 ENCOUNTER — Encounter: Payer: Self-pay | Admitting: Family Medicine

## 2024-01-22 ENCOUNTER — Other Ambulatory Visit (HOSPITAL_COMMUNITY): Payer: Self-pay

## 2024-01-22 ENCOUNTER — Ambulatory Visit: Attending: Family Medicine | Admitting: Physical Therapy

## 2024-01-22 ENCOUNTER — Ambulatory Visit (INDEPENDENT_AMBULATORY_CARE_PROVIDER_SITE_OTHER): Admitting: Cardiovascular Disease

## 2024-01-22 VITALS — BP 177/78 | HR 88 | Ht 62.0 in | Wt 156.6 lb

## 2024-01-22 DIAGNOSIS — R252 Cramp and spasm: Secondary | ICD-10-CM | POA: Diagnosis present

## 2024-01-22 DIAGNOSIS — E785 Hyperlipidemia, unspecified: Secondary | ICD-10-CM | POA: Diagnosis not present

## 2024-01-22 DIAGNOSIS — R5383 Other fatigue: Secondary | ICD-10-CM

## 2024-01-22 DIAGNOSIS — Z5181 Encounter for therapeutic drug level monitoring: Secondary | ICD-10-CM

## 2024-01-22 DIAGNOSIS — I10 Essential (primary) hypertension: Secondary | ICD-10-CM | POA: Diagnosis not present

## 2024-01-22 DIAGNOSIS — R42 Dizziness and giddiness: Secondary | ICD-10-CM | POA: Diagnosis not present

## 2024-01-22 DIAGNOSIS — G4733 Obstructive sleep apnea (adult) (pediatric): Secondary | ICD-10-CM | POA: Diagnosis not present

## 2024-01-22 DIAGNOSIS — R030 Elevated blood-pressure reading, without diagnosis of hypertension: Secondary | ICD-10-CM | POA: Diagnosis not present

## 2024-01-22 DIAGNOSIS — R278 Other lack of coordination: Secondary | ICD-10-CM | POA: Insufficient documentation

## 2024-01-22 DIAGNOSIS — Z8639 Personal history of other endocrine, nutritional and metabolic disease: Secondary | ICD-10-CM

## 2024-01-22 DIAGNOSIS — R102 Pelvic and perineal pain unspecified side: Secondary | ICD-10-CM | POA: Diagnosis present

## 2024-01-22 MED ORDER — REPATHA SURECLICK 140 MG/ML ~~LOC~~ SOAJ: 140.0000 mg | SUBCUTANEOUS | 0 refills | Status: DC

## 2024-01-22 MED ORDER — REPATHA SURECLICK 140 MG/ML ~~LOC~~ SOAJ: 140.0000 mg | SUBCUTANEOUS | 3 refills | Status: DC

## 2024-01-22 NOTE — Telephone Encounter (Signed)
 Patient seen in office by Dr Raford today  Started on Repatha  Will forward to PA team to initiate prior authorization

## 2024-01-22 NOTE — Patient Instructions (Addendum)
 Medication Instructions:  START REPATHA EVERY 14 DAYS   Labwork: RENIN/ALDOSTERONE/METANEPHRINE/IRON LEVEL SOON - TO BE DONE FIRST THING IN THE MORNING.   FASTING LP/CMET ABOUT A WEEK PRIOR TO YOUR FOLLOW UP IN 2 MONTHS   Testing/Procedures: 24 HOUR BLOOD PRESSURE MONITOR  Follow-Up: 2 MONTHS WITH DR Empire OR CAITLIN W NP    Subcutaneous Injection Instructions Using a Prefilled Syringe A subcutaneous injection is a shot of medicine that is given into the layer of fat and tissue between skin and muscle. The injection is given with a single-use syringe that is already filled with medicine. The syringe is called a prefilled syringe. Read the medicine guide or package insert that came with the syringe. Follow directions from the guide about how to prepare and give the injection. This is important because the directions may be different for each medicine. Use only the syringe, needle, and medicine that your health care provider prescribes. Use each prefilled syringe and needle only one time. Supplies needed: Prefilled syringe with needle. Use the needle length and size (gauge) that your provider or pharmacist gives to you. Alcohol wipes. Gauze. Bandage. A container to put used syringes. This may be a sharps container or a hard plastic container that has a secure lid, such as an empty laundry detergent bottle. How to choose a site for injection Follow instructions from your provider about where to give an injection. Do not inject in the same spot each time. There are five main areas that can be used for injecting. These areas include: Abdomen. Avoid the area that is within 2 inches (5 cm) of your navel (umbilicus). Front of thigh. Upper, outer side of thigh. Upper, outer side of arm. Upper, outer part of butt. How to give an injection using a prefilled syringe  Wash your hands with soap and water. If soap and water are not available, use hand sanitizer. Use an alcohol wipe to clean  the site where you will be injecting the needle. Let the site air-dry. Remove the plastic cover from the needle on the syringe. Do not let the needle touch anything. Hold the syringe with the needle pointing up. Check the syringe for any remaining air bubbles. If there are air bubbles, flick the syringe with your finger until the air bubbles rise to the top. Then, gently push on the plunger until you can see a drop of medicine appear at the tip of the needle. This will clear any remaining air bubbles from the syringe. Hold the syringe in your writing hand like a pencil. Use your other hand to pinch and hold about an inch (2.5 cm) of skin. Do not directly touch the cleaned part of the skin. Gently but quickly, put the needle straight into the skin. The needle should be at a 90-degree angle to the skin. The needle may need to be injected at a 45-degree angle in thin adults or children who have a small amount of body fat. Follow instructions from your provider about the right size needle and angle you should use for the injection. After the needle is completely inserted into the skin, release the skin that you are pinching. Continue to hold the syringe with your writing hand. Use your thumb or index finger of your writing hand to push the plunger all the way into the syringe to inject the medicine. Pull the needle straight out of the skin. If there is bleeding: Press and hold a piece of gauze over the injection site until bleeding stops.  Do not rub the area. Cover the injection site with a bandage, if needed. How to safely throw away the supplies If you are using a syringe that does not have a safety system for shielding the needle after injection: Do not recap the needle. Place the syringe and needle in the disposal container. If your syringe has a safety system for shielding the needle after injection: Firmly push down on the plunger after you complete the injection. The protective sleeve will  automatically cover the needle, and you will hear a click. The click means that the needle is safely covered. Follow the disposal regulations for the area where you live. Do not use any syringe or needle more than one time. Contact a health care provider if: You have trouble giving the injection. You think that the injection was not given correctly. You have trouble with any of the supplies. The medicine causes side effects. You get a rash on your skin. A get a fever. The condition that is being treated gets worse. Get help right away if: You get any of these symptoms after the injection is given: Trouble breathing. Chest pain. A rash over most or all of your body. Swelling of the lips or tongue. Trouble swallowing. These symptoms may be an emergency. Get help right away. Call 911. Do not wait to see if the symptoms will go away. Do not drive yourself to the hospital. This information is not intended to replace advice given to you by your health care provider. Make sure you discuss any questions you have with your health care provider. Document Revised: 11/25/2021 Document Reviewed: 11/25/2021 Elsevier Patient Education  2024 [employer].

## 2024-01-22 NOTE — Progress Notes (Signed)
 [facility] Initial Assessment:    Date:  01/22/2024   ID:  Diana Wolfe, DOB 1950/08/15, MRN [id number]  PCP:  Alvia Corean CROME, FNP  Cardiologist:  Soyla DELENA Merck, MD   Referring MD: Alvia Corean CROME, *   CC: Hypertension  History of Present Illness:    Diana Wolfe is a 73 y.o. female with a hx of elevated blood pressure, ADHD, hypothyroidism, and OSA intolerant of CPAP here to establish care in the [facility]. She struggles with labile HTN ranging from the 120s-200s systolic.  SHe saw Dr. Acharya and has hydralazine  to use as needed.  She has not tolerated CPAP.  She noted that her BP became more labile after having COVID-19.    Discussed the use of AI scribe software for clinical note transcription with the patient, who gave verbal consent to proceed.  History of Present Illness Diana Wolfe has a long-standing history of fluctuating blood pressure, with readings often high at medical appointments but normal at home. She uses a wrist cuff at home, which she believes reads about fifteen points lower than the office cuff. Her blood pressure was 121/70 at home this morning, but it was elevated during today's visit. She was previously prescribed hydralazine  for high blood pressure, with instructions to take it if her systolic pressure exceeded 190, but she has not needed to take it as her blood pressure has not reached that threshold.  She notes that if she takes it, her BP drops drastically and she is symptomatic.    She experiences significant fatigue and brain fog, describing herself as feeling like a 'zombie' and operating at 'twenty-five percent' of her normal functioning. These symptoms have been more pronounced over the past three to four months. She associates these symptoms with low diastolic blood pressure, although today she feels fatigued despite not having low blood pressure. She also reports feeling unsteady and weak,  particularly in the mornings.  She experiences shortness of breath and sometimes feels unable to take a deep breath, which she attributes to anxiety.  Symptoms can occur at rest or with exertion.   No swelling in her legs or feet, but she notes some soreness and possible swelling in a knee with a rod from a previous surgery.  She is currently taking Adderall, which she notes may slightly elevate her blood pressure, but she did not take it today. She has been denied a refill for meloxicam , which she uses sparingly for pain. Her diet is variable, often eating out or bringing food home, and she acknowledges it may not be optimal. She consumes little caffeine and alcohol, with occasional tea and a rare glass of wine. She has recently started taking a blood pressure support supplement and calcium  for her nails, along with vitamins B, C, and D.  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 She tries to stay active by walking her dog. She uses a CPAP machine for sleep apnea, which she has been using for a year without noticing improvement in her symptoms.  Previous antihypertensives:   Past Medical History:  Diagnosis Date   ADD (attention deficit disorder)    Allergy 1988   Anemia    Anxiety    Arthritis 2009   Depression    GERD (gastroesophageal reflux disease)    Hyperlipidemia    Hypertension    Hypothyroid    Osteoporosis    PONV (postoperative  nausea and vomiting)    Sleep apnea    using cpap since 2025   Vitamin D  deficiency     Past Surgical History:  Procedure Laterality Date   BREAST BIOPSY Left 07/09/2019   CESAREAN SECTION     ESOPHAGOGASTRODUODENOSCOPY N/A 09/02/2015   Procedure: ESOPHAGOGASTRODUODENOSCOPY (EGD);  Surgeon: Lynwood Bohr, MD;  Location[facility] ENDOSCOPY;  Service: Endoscopy;  Laterality: N/A;   ESOPHAGOGASTRODUODENOSCOPY N/A 06/05/2022   Procedure: ESOPHAGOGASTRODUODENOSCOPY (EGD);  Surgeon: Burnette Fallow, MD;   Location: [facility];  Service: Gastroenterology;  Laterality: N/A;   FOOT SURGERY Right    FOREIGN BODY REMOVAL N/A 06/05/2022   Procedure: FOREIGN BODY REMOVAL;  Surgeon: Burnette Fallow, MD;  Location: [facility] ENDOSCOPY;  Service: Gastroenterology;  Laterality: N/A;   FRACTURE SURGERY     IM NAILING TIBIA Left 01/09/2001   Dr. Jerrell Mt, [place] [facility]   TONSILLECTOMY  2002    Current Medications: Current Meds  Medication Sig   amphetamine -dextroamphetamine  (ADDERALL) 20 MG tablet Take 1 tablet (20 mg total) by mouth daily.   amphetamine -dextroamphetamine  (ADDERALL) 20 MG tablet Take 1 tablet (20 mg total) by mouth daily.   amphetamine -dextroamphetamine  (ADDERALL) 20 MG tablet Take 1 tablet (20 mg total) by mouth daily.   amphetamine -dextroamphetamine  (ADDERALL) 20 MG tablet Take 1 tablet (20 mg total) by mouth daily.   [START ON 02/04/2024] amphetamine -dextroamphetamine  (ADDERALL) 20 MG tablet Take 1 tablet (20 mg total) by mouth daily.   aspirin 81 MG chewable tablet 3-4 days a week   cetirizine (ZYRTEC) 10 MG tablet PRN   Cholecalciferol (VITAMIN D3) 10 MCG (400 UNIT) CAPS Take 2 capsules by mouth daily.   Cyanocobalamin  (B-12) 1000 MCG TABS Take by mouth.   diclofenac (VOLTAREN) 75 MG EC tablet Take 75 mg by mouth 2 (two) times daily.   estradiol  (ESTRACE  VAGINAL) 0.1 MG/GM vaginal cream Place 1 Applicatorful vaginally at bedtime.   Evolocumab (REPATHA SURECLICK) 140 MG/ML SOAJ Inject 140 mg into the skin every 14 (fourteen) days.   Evolocumab (REPATHA SURECLICK) 140 MG/ML SOAJ Inject 140 mg into the skin every 14 (fourteen) days for 14 days.   hydrALAZINE  (APRESOLINE ) 10 MG tablet Take half tablet as needed for systolic blood pressure (top number) more than 190. If blood pressure remains more than 190 one hour after taking half tablet, may take additional half tablet.   levothyroxine  (SYNTHROID ) 75 MCG tablet TAKE 1 AND 1/2 TABLETS BY MOUTH ON MONDAYS, WEDNESDAYS AND FRIDAYS THEN TAKE  1 TABLET ON OTHER DAYS OF THE WEEK. TAKE ON EMPTY STOMACH WITH ONLY WATER FOR 30 MINUTES AND NO ANTACIDS, CALCIUM  OR MAGNESIUM FOR 4 HOURS AND AVOID BIOTIN   meclizine  (ANTIVERT ) 25 MG tablet 1/2-1 pill up to 3 times daily for motion sickness/dizziness   meloxicam  (MOBIC ) 15 MG tablet TAKE 1 TABLET BY MOUTH DAILY   Multiple Vitamin (MULTIVITAMIN) capsule Take 1 capsule by mouth daily.   omeprazole  (PRILOSEC) 40 MG capsule Take 1 capsule (40 mg total) by mouth daily. TAKE ONE CAPSULE BY MOUTH DAILY TO PREVENT HEARTBURN AND INDIGESTION (Patient taking differently: Take 40 mg by mouth as needed. TAKE ONE CAPSULE BY MOUTH DAILY TO PREVENT HEARTBURN AND INDIGESTION)   vitamin C (ASCORBIC ACID) 500 MG tablet Take 1,200 mg by mouth daily.   Vitamin D , Ergocalciferol , (DRISDOL ) 1.25 MG (50000 UNIT) CAPS capsule TAKE 1 CAPSULE BY MOUTH 3 TIMES A WEEK ON MONDAY- WEDNESDAY- FRIDAY FOR SEVERE VITAMIN D . DEFICIENCY     Allergies:   Levofloxacin, Buspirone , Cefprozil, Codeine, Hydrocodone-acetaminophen ,  Hydrocodone-acetaminophen , Morphine, Nitrofuran derivatives, Pravastatin, and Lexapro  [escitalopram  oxalate]   Social History   Socioeconomic History   Marital status: Divorced    Spouse name: Not on file   Number of children: Not on file   Years of education: Not on file   Highest education level: Bachelor's degree (e.g., BA, AB, BS)  Occupational History   Not on file  Tobacco Use   Smoking status: Never    Passive exposure: Past   Smokeless tobacco: Never  Substance and Sexual Activity   Alcohol use: Yes    Alcohol/week: 1.0 standard drink of alcohol    Types: 1 Glasses of wine per week    Comment: occasional   Drug use: No   Sexual activity: Not Currently  Other Topics Concern   Not on file  Social History Narrative   Not on file   Social Drivers of Health   Financial Resource Strain: Medium Risk (01/22/2024)   Overall Financial Resource Strain (CARDIA)    Difficulty of Paying Living  Expenses: Somewhat hard  Food Insecurity: No Food Insecurity (01/22/2024)   Hunger Vital Sign    Worried About Running Out of Food in the Last Year: Never true    Ran Out of Food in the Last Year: Never true  Transportation Needs: No Transportation Needs (01/22/2024)   PRAPARE - Administrator, Civil Service (Medical): No    Lack of Transportation (Non-Medical): No  Physical Activity: Inactive (01/22/2024)   Exercise Vital Sign    Days of Exercise per Week: 0 days    Minutes of Exercise per Session: 0 min  Stress: Stress Concern Present (01/22/2024)   [employer] of Occupational Health - Occupational Stress Questionnaire    Feeling of Stress: Rather much  Social Connections: Unknown (01/22/2024)   Social Connection and Isolation Panel    Frequency of Communication with Friends and Family: Once a week    Frequency of Social Gatherings with Friends and Family: Never    Attends Religious Services: Not on Insurance Claims Handler of Clubs or Organizations: Yes    Attends Engineer, Structural: More than 4 times per year    Marital Status: Divorced  Recent Concern: Social Connections - Socially Isolated (10/25/2023)   Social Connection and Isolation Panel    Frequency of Communication with Friends and Family: Never    Frequency of Social Gatherings with Friends and Family: Never    Attends Religious Services: More than 4 times per year    Active Member of [employer] or Organizations: No    Attends Engineer, Structural: Never    Marital Status: Divorced     Family History: The patient's family history includes Asthma in her mother and sister; Breast cancer in her cousin and mother; Cancer in her father and paternal grandmother; Cancer (age of onset: 39) in her mother; Diabetes in her mother; Heart attack (age of onset: 71 - 74) in her father; Heart disease in her father and paternal grandfather; Hypertension in her father; Stroke in her maternal grandmother and  mother.  ROS:   Please see the history of present illness.     All other systems reviewed and are negative.  EKGs/Labs/Other Studies Reviewed:    EKG:  EKG is not ordered today.    Recent Labs: 11/20/2023: ALT 20; BUN 16; Creatinine, Ser 0.78; Hemoglobin 15.4; Platelets 464.0; Potassium 4.2; Sodium 138; TSH 0.44   Recent Lipid Panel    Component Value Date/Time  CHOL 231 (H) 06/05/2023 1229   TRIG 192.0 (H) 06/05/2023 1229   HDL 59.10 06/05/2023 1229   CHOLHDL 4 06/05/2023 1229   VLDL 38.4 06/05/2023 1229   LDLCALC 134 (H) 06/05/2023 1229   LDLCALC 125 (H) 10/18/2022 1158   LDLDIRECT 138.2 04/25/2008 1514    Physical Exam:   VS:  BP (!) 177/78 (BP Location: Left Arm, Patient Position: Sitting, Cuff Size: Normal)   Pulse 88   Ht 5' 2 (1.575 m)   Wt 156 lb 9.6 oz (71 kg)   SpO2 96%   BMI 28.64 kg/m  , BMI Body mass index is 28.64 kg/m. GENERAL:  Well appearing HEENT: Pupils equal round and reactive, fundi not visualized, oral mucosa unremarkable NECK:  No jugular venous distention, waveform within normal limits, carotid upstroke brisk and symmetric, no bruits, no thyromegaly LUNGS:  Clear to auscultation bilaterally HEART:  RRR.  PMI not displaced or sustained,S1 and S2 within normal limits, no S3, no S4, no clicks, no rubs, no murmurs ABD:  Flat, positive bowel sounds normal in frequency in pitch, no bruits, no rebound, no guarding, no midline pulsatile mass, no hepatomegaly, no splenomegaly EXT:  2 plus pulses throughout, no edema, no cyanosis no clubbing SKIN:  No rashes no nodules NEURO:  Cranial nerves II through XII grossly intact, motor grossly intact throughout PSYCH:  Cognitively intact, oriented to person place and time  ASSESSMENT/PLAN:    Assessment & Plan # Primary hypertension with labile blood pressure Labile blood pressure with episodes of high and low readings, possible white coat hypertension. Unclear what her home BP truly runs.  She reports that  anxiety likely also contributes.  BP drops when she takes medication for hypertension.  She has hydralazine  to take as needed.  Symptoms of brain fog and fatigue not consistently correlated with blood pressure, suggesting other causes.  She feels brain fog regardless of whether BP is high or low. She started an oral OTC supplement for BP support.  Given that her BP may actually be low at times, recommend not taking any supplements like this at this time.  - Ordered 24-hour ambulatory blood pressure monitoring to better understand her BP pattern and whether she has HTN.  - Ordered morning hormone labs: renin, aldosterone, metanephrines. - Advised discontinuation of blood pressure supplement. - Discussed non-statin cholesterol medications like Repatha.  # Hyperlipidemia with aortic atherosclerosis Hyperlipidemia with aortic atherosclerosis.  Also strong family history of ASCVD.  Previous pravastatin intolerance. She isn't interested in trying other statins. Discussed dietary modifications and non-statin options like Repatha. - Recommended dietary modifications: lean meats, vegetables, fruits, complex carbohydrates. - Discussed non-statin cholesterol medications like Repatha. - She will try Repatha 140mg  every other week.  Repeat lipids/CMP in 2-3 months - Continue aspirin 81mg  daily - Recommend at least 150 minutes of exercise weekly.   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  Based on her research, patient thinks it is due to iron deficiency.  I explained that this is unlikely given hbg >15.  Check iron level per patient request.  Will defer to primary care/dermatology.   # Obstructive sleep apnea Managed with CPAP therapy. No symptom improvement despite use. Symptoms not attributed to sleep apnea. - Continue CPAP therapy.    Screening for Secondary Hypertension:     07/07/2022    4:44 PM 07/07/2022    4:45 PM 01/22/2024   12:12 PM  Causes  Drugs/Herbals   Screened Screened     -  Comments  No OTC agents Limits caffeine. Limited EtOH.  Rare meloxicam .  BP support Dr. Minette.  Renovascular HTN Screened       - Comments Renal duplex 03/2022 bilateral 1-59% renal artery stenosis    Sleep Apnea  Screened Screened     - Comments  Manged with dental device   Thyroid  Disease Screened  Screened     - Comments Hypothyroidism appropriately managed by PCP  treated with levothyroxine   Hyperaldosteronism Not Screened -- Screened     - Comments  Not screened as BP controlled. If BP uncontrolled in future, consider plasma renin aldosterone check renin and aldosterone  Pheochromocytoma Screened  Screened     - Comments CT 10/2020 normal adrenals without pheochromocytoma  check metanephrines  Cushing's Syndrome N/A  N/A     - Comments Non cushingoid appearance    Hyperparathyroidism   N/A  Coarctation of the Aorta N/A  Screened     - Comments BP symmetrical  BP symmetric  Compliance   Screened    Relevant Labs/Studies:    Latest Ref Rng & Units 11/20/2023    2:23 PM 06/05/2023   12:29 PM 10/18/2022   11:58 AM  Basic Labs  Sodium 135 - 145 mEq/L 138  137  142   Potassium 3.5 - 5.1 mEq/L 4.2  3.8  4.3   Creatinine 0.40 - 1.20 mg/dL 9.21  9.13  9.15        Latest Ref Rng & Units 11/20/2023    2:23 PM 06/05/2023   12:29 PM  Thyroid    TSH 0.35 - 5.50 uIU/mL 0.44  2.63              Latest Ref Rng & Units 11/26/2019    4:55 PM  Cortisol  Cortisol  mcg/dL 4.2        08/21/7973    9:58 AM  Renovascular   Renal Artery US  Completed Yes     Disposition:    FU with MD/PharmD in 2 months    Medication Adjustments/Labs and Tests Ordered: Current medicines are reviewed at length with the patient today.  Concerns regarding medicines are outlined above.  Orders Placed This Encounter  Procedures   Aldosterone + renin activity w/ ratio   Iron   Metanephrines, plasma   Lipid panel   Comprehensive metabolic panel with GFR   CAR 75YM BLOOD PRESSURE  MONITOR   Meds ordered this encounter  Medications   Evolocumab (REPATHA SURECLICK) 140 MG/ML SOAJ    Sig: Inject 140 mg into the skin every 14 (fourteen) days.    Dispense:  6 mL    Refill:  3   Evolocumab (REPATHA SURECLICK) 140 MG/ML SOAJ    Sig: Inject 140 mg into the skin every 14 (fourteen) days for 14 days.    Dispense:  1 mL    Refill:  0    Lot Number?:   [id number]    Expiration Date?:   04/21/2026    Quantity:   1     Signed, Annabella Scarce, MD  01/22/2024 1:43 PM    [facility]

## 2024-01-22 NOTE — Therapy (Signed)
 OUTPATIENT PHYSICAL THERAPY FEMALE PELVIC TREATMENT   Patient Name: Diana Wolfe MRN: [id number] DOB:1950/03/27, 73 y.o., female Today's Date: 01/22/2024     END OF SESSION:  PT End of Session - 01/22/24 1616     Visit Number 15    Date for Recertification  02/01/24    Authorization Type Medicare/ [employer]    Authorization - Visit Number 15    Authorization - Number of Visits 20    PT Start Time 1615    PT Stop Time 1655    PT Time Calculation (min) 40 min    Activity Tolerance Patient tolerated treatment well    Behavior During Therapy WFL for tasks assessed/performed          Past Medical History:  Diagnosis Date   ADD (attention deficit disorder)    Allergy 1988   Anemia    Anxiety    Arthritis 2009   Depression    GERD (gastroesophageal reflux disease)    Hyperlipidemia    Hypertension    Hypothyroid    Osteoporosis    PONV (postoperative nausea and vomiting)    Sleep apnea    using cpap since 2025   Vitamin D  deficiency    Past Surgical History:  Procedure Laterality Date   BREAST BIOPSY Left 07/09/2019   CESAREAN SECTION     ESOPHAGOGASTRODUODENOSCOPY N/A 09/02/2015   Procedure: ESOPHAGOGASTRODUODENOSCOPY (EGD);  Surgeon: Lynwood Bohr, MD;  Location: [facility] ENDOSCOPY;  Service: Endoscopy;  Laterality: N/A;   ESOPHAGOGASTRODUODENOSCOPY N/A 06/05/2022   Procedure: ESOPHAGOGASTRODUODENOSCOPY (EGD);  Surgeon: Burnette Fallow, MD;  Location: [facility];  Service: Gastroenterology;  Laterality: N/A;   FOOT SURGERY Right    FOREIGN BODY REMOVAL N/A 06/05/2022   Procedure: FOREIGN BODY REMOVAL;  Surgeon: Burnette Fallow, MD;  Location: [facility] ENDOSCOPY;  Service: Gastroenterology;  Laterality: N/A;   FRACTURE SURGERY     IM NAILING TIBIA Left 01/09/2001   Dr. 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   Dysphagia 04/21/2020   Generalized abdominal pain 04/21/2020   Hematochezia 04/21/2020   Left lower quadrant pain 04/21/2020   History of colonic polyps 04/21/2020   Urticaria 04/21/2020   B12 deficiency 01/31/2019   Medication management 06/23/2014   Vaginal atrophy 03/27/2014   Hyperlipidemia    Hypertension    Anxiety    GERD (gastroesophageal reflux disease)    ADD (attention deficit disorder)    Depression    Vitamin D  deficiency    Hypothyroidism 04/25/2008   Iron deficiency anemia 04/25/2008   Obstructive sleep apnea 11/16/2007   Allergic rhinitis 11/16/2007   Headache 11/16/2007   PULMONARY EMBOLISM, HX OF 11/16/2007    PCP: Alvia Corean CROME, FNP  REFERRING PROVIDER: Alvia Corean CROME, FNP   REFERRING DIAG:  [phone] (ICD-10-CM) - Medication management  M62.89 (ICD-10-CM) - Pelvic floor dysfunction in female    THERAPY DIAG:  Cramp and spasm  Other lack of coordination  Pelvic pain  Rationale for Evaluation and Treatment: Rehabilitation  ONSET DATE: 2021  SUBJECTIVE:  SUBJECTIVE STATEMENT: I am having issues with the extreme fatigue and brain fog. I am on the 4th dilator. No urinary leakage since last visit. I have had some urgency. I am using the TENS unit. I have done a 4 hour drive without stopping and no leakage at end of drive when walking to the bathroom.    PAIN:  Are you having pain? Yes NPRS scale: 7/10 Pain location: Vaginal  Pain type: aching Pain description: intermittent   Aggravating factors: during  vaginal penetration Relieving factors: no vaginal penetration  PRECAUTIONS: None  RED FLAGS: None   WEIGHT BEARING RESTRICTIONS: No  FALLS:  Has patient fallen in last 6 months? Yes. Number of falls 1 time slipped on ice.   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  URINATION: Pain with urination: No Fully empty bladder: Yes: sometimes will empty her bladder and 10 minutes have to urinate again Stream: Strong Urgency: Yes , getting out of car, sitting in house 08/07/23: feels the urgency after driving more than 1 hour; no urgency with sitting in house.  Frequency: night time 2-3 times: daytime: 6 times 07/31/23: wake up 2 times at night 08/07/23: wake up 1 time to urinate.  Leakage: Urge to void and Walking to the bathroom 08/07/23: urinary leakage 1 time since last week when she got out of the car and rushed to the bathroom Pads: No  INTERCOURSE:not sexually active at this time but in the future would like to be able to.    PREGNANCY: C-section deliveries 1  PROLAPSE: None   OBJECTIVE:  Note: Objective measures were completed at Evaluation unless otherwise noted.  DIAGNOSTIC FINDINGS:  none  PATIENT SURVEYS:  UIQ-7: 48 PFIQ-7: 44 08/16/23: UIQ-7: 33 PFIQ-7: 28 10/27/23: UIQ-7: 48 PFIQ-7: 44  COGNITION: Overall cognitive status: Within functional limits for tasks assessed     SENSATION: Light touch: Appears intact   POSTURE: decreased lumbar lordosis   LUMBARAROM/PROM:  A/PROM A/PROM  eval A/PROM  10/27/23  Extension Decreased by 50% full   (Blank rows = not tested)  LOWER EXTREMITY ROM: bilateral hip ROM is full   LOWER EXTREMITY MMT:  MMT Right eval Left eval  Right 10/27/23 Left 10/27/23  Hip extension 4/5 4/5 5/5 5/5  Hip abduction 4/5 5/5 5/5 5/5   (Blank rows = not tested) PALPATION:    Pelvic Alignment: ASIS  Abdominal: decreased mobility of the tissue below the scar, decreased movement of the diaphragm, difficulty with opening her lower rib cage                External Perineal Exam: dry, redness along the vulva, white patch of skin along the inner left labia majora; Q-tip test was positive at 6, 11 and 1 O'Clock.                              Internal Pelvic Floor: pain with Q-tip therefore did not assess any further, when she bulged therapist did not see any prolapse, the vaginal canal was small  Patient confirms identification and approves PT to assess internal pelvic floor and treatment Yes  PELVIC MMT:not tested due to the pain   MMT 07/31/23 12/18/23 01/15/24  Vaginal 2/5 3/5 4 s 4/5 2 s  Internal Anal Sphincter     External Anal Sphincter     Puborectalis     Diastasis Recti     (Blank rows = not tested)         TODAY'S TREATMENT:  01/22/24 Neuromuscular re-education: Pelvic floor contraction training: Stand on vibration plate on strength setting for 30 seconds x 3 Standing bilateral should extension with green band with alternate marching 20 x  Standing with holding green band at midline walking sideways 10 x each way PLANK against the wall with diagonal hip flexion 20 x  Step up on step with other leg doing hip flexion 20 x  Standing with one leg forward and diagonal to incorporate the pelvic floor 15 x each side Sitting hip abduction with yellow band with pelvic floor contraction Sitting hip internal rotation with yellow band 15 x each side Supine alternate shoulder and hip flexion with toe touch 20 x     01/15/24 Manual: Internal pelvic floor techniques: No emotional/communication barriers or cognitive limitation. Patient is motivated to learn. Patient understands and agrees with treatment goals and plan. PT  explains patient will be examined in standing, sitting, and lying down to see how their muscles and joints work. When they are ready, they will be asked to remove their underwear so PT can examine their perineum. The patient is also given the option of providing their own chaperone as one is not provided in our facility. The patient also has the right and is explained the right to defer or refuse any part of the evaluation or treatment including the internal exam. With the patient's consent, PT will use one gloved finger to gently assess the muscles of the pelvic floor, seeing how well it contracts and relaxes and if there is muscle symmetry. After, the patient will get dressed and PT and patient will discuss exam findings and plan of care. PT and patient discuss plan of care, schedule, attendance policy and HEP activities.  Therapist gloved finger in the vaginal canal with releasing around the bladder, release around the clitoris, manual work around the introitus elongating the tissue Neuromuscular re-education: Pelvic floor contraction training: Curl up with feet on mat and verbal cues to contract the abdominals Supine pull the green band to the side 15 x each way working the obliques Supine bilateral shoulder extension with green band 20 x Diaphragmatic breathing with pelvic drop Self-care: Discussed with patient on the home TENS unit, urinary urgency with caffeine, progressing with dilator    01/03/24 Manual: Myofascial release: Fascial release of the mons pubis, round ligament, clitoris going through the restrictiona Internal pelvic floor techniques: No emotional/communication barriers or cognitive limitation. Patient is motivated to learn. Patient understands and agrees with treatment goals and plan. PT explains patient will be examined in standing, sitting, and lying down to see how their muscles and joints work. When they are ready, they will be asked to remove their underwear so PT can  examine their perineum. The patient is also given the option of providing their own chaperone as one is not provided in our facility. The patient also has the right and is explained the right to defer or refuse any part of the evaluation or treatment including the internal exam. With the patient's consent, PT will use one gloved finger to gently assess the muscles of the pelvic floor, seeing how well it contracts and relaxes and if there is muscle symmetry. After, the patient will get  dressed and PT and patient will discuss exam findings and plan of care. PT and patient discuss plan of care, schedule, attendance policy and HEP activities.  Therapist gloved finger in the vaginal canal releasing around the urethra and bladder to reduce urgency Neuromuscular re-education: Down training: Home TENS unit with one electrode behind the left malleolus and one on the medial superior arch for 20 minutes; Pulse width is 200-250 usec Frequency 10-12 HZ for 20 minutes and educated patient on how to perform at home.  ESelf-care: Discussed patient trying another lubricant to see if that is the irritant and gave her 2 different samples.    PATIENT EDUCATION:  01/15/24 Education details: Access Code: [email or web address], abdominal scar massage Person educated: Patient Education method: Explanation, Demonstration, Tactile cues, Verbal cues, and Handouts Education comprehension: verbalized understanding, returned demonstration, verbal cues required, tactile cues required, and needs further education  HOME EXERCISE PROGRAM: 01/15/24 Access Code: [email or web address] URL: [email or web address] Date: 01/15/2024 Prepared by: Channing Pereyra  Program Notes sit on ball and massage the pelvic floor daily for several minutes.   Exercises - Seated Diaphragmatic Breathing  - 1 x daily - 7 x weekly - 3 sets - 10 reps - Seated Piriformis Stretch with Trunk Bend  - 1 x daily - 7 x weekly - 1 sets - 2 reps - 30 sec hold - Seated  Happy Baby With Trunk Flexion For Pelvic Relaxation  - 1 x daily - 7 x weekly - 1 sets - 1 reps - 30 sec hold - Supine Piriformis Stretch with Leg Straight  - 1 x daily - 7 x weekly - 1 sets - 2 reps - 30 sec hold - Quadruped Pelvic Floor Contraction with Opposite Arm and Leg Lift  - 1 x daily - 1 x weekly - 2 sets - 10 reps - Posterior Chain Stretch  - 1 x daily - 3 x weekly - 1 sets - 5 reps - Butterfly Groin Stretch  - 1 x daily - 3 x weekly - 1 sets - 1 reps - 30 sec hold - Quadruped Yoga Block Lift Off  - 1 x daily - 3 x weekly - 1 sets - 10 reps - Curl Up with Reach  - 1 x daily - 7 x weekly - 3 sets - 10 reps - Hooklying Anti-Rotation Press With Anchored Resistance  - 1 x daily - 7 x weekly - 3 sets - 10 reps - Shoulder Extension with Resistance - 90/90 on Swiss Ball  - 1 x daily - 3 x weekly - 2 sets - 10 reps   ASSESSMENT:  CLINICAL IMPRESSION: Patient is a 73 y.o. female who was seen today for physical therapy  treatment for pelvis floor dysfunction.  Patient has used the TENS unit and has less urgency. Patient has not leaked urine since the last visit. She  was able to d a 4 hour drive without stopping then walk to the bathroom without leaking. Patient has focused on core and pelvic floor strength this session. She is having difficulty with brain fog and fatigue making it difficult to exercise.  Patient will benefit from skilled therapy to improve pelvic floor tissue health, elongation of the vaginal canal and working on coordination to reduce urgency.   OBJECTIVE IMPAIRMENTS: decreased coordination, decreased ROM, decreased strength, increased fascial restrictions, increased muscle spasms, impaired tone, and pain.   ACTIVITY LIMITATIONS: continence, toileting, and locomotion level  PARTICIPATION LIMITATIONS: interpersonal relationship, driving, shopping, and community activity  PERSONAL FACTORS: Fitness, Time  since onset of injury/illness/exacerbation, and 1-2 comorbidities:  Hypothyroid; Hypertension; Cesarean section are also affecting patient's functional outcome.   REHAB POTENTIAL: Good  CLINICAL DECISION MAKING: Evolving/moderate complexity  EVALUATION COMPLEXITY: Moderate   GOALS: Goals reviewed with patient? Yes  SHORT TERM GOALS: Target date: 06/22/23  Patient educated on vaginal moisturizers and understands how they are used and why to improve the health of the vaginal tissue.  Baseline: Goal status: Met 05/29/23  2.  Patient is able to perform diaphragmatic breathing to elongate her pelvic floor.  Baseline:  Goal status: met 07/24/23  3.  Patient understands was to perform perineal massage externally to start to relax the pelvic floor tissue.  Baseline:  Goal status: Met 07/31/23  4.  Patient is independent with flexibility exercises to indirectly elongate the pelvic floor.  Baseline:  Goal status: Met 07/24/23  5.  Patient educated on vaginal dilators to assist with the expansion of the pelvic floor.  Baseline:  Goal status: met 08/16/23  LONG TERM GOALS: Target date: 01/27/24  Patient independent with advanced HEP to increase strength of core and pelvic floor.  Baseline:  Goal status: ongoing 10/27/23  2.  Patient is able to use the dilator that is appropriate for her goal to have vaginal penetration with pain level </= 0-2/10.  Baseline: Patient is on the 3rd one Goal status: ongoing 12/18/23  3.  Patient pelvic floor strength increased to reduce urinary leakage with urgency.  Baseline:  Goal status: ongoing 10/27/23  4.  Patient is able to use the behavioral technique to reduce the urge to void so she is able to slowly walk to the bathroom without leaking urine and enter her house.  Baseline:  Goal status: ongoing 12/18/23  5.  Patient reports she is able to urinate 1-2 times at night due to using the urge to void technique.  Baseline:  Goal status: Met 08/07/23   PLAN:  PT FREQUENCY: 1-2x/week  PT DURATION: 12 weeks  PLANNED  INTERVENTIONS: 97110-Therapeutic exercises, 97530- Therapeutic activity, 97112- Neuromuscular re-education, 97535- Self Care, [id number]- Manual therapy, [id number]- Electrical stimulation (unattended), 97035- Ultrasound, Patient/Family education, Dry Needling, Joint mobilization, Spinal mobilization, Scar mobilization, Cryotherapy, Moist heat, and Biofeedback  PLAN FOR NEXT SESSION:   hip mobility and lumbar mobility, lower abdominal contraction   Channing Pereyra, PT 01/22/24 4:56 PM

## 2024-01-22 NOTE — Telephone Encounter (Signed)
 Pharmacy Patient Advocate Encounter   Received notification from Physician's Office that prior authorization for REPATHA  is required/requested.   Insurance verification completed.   The patient is insured through [facility] MEDICARE .   Per test claim:  PRALUENT is preferred by the insurance.  If suggested medication is appropriate, Please send in a new RX and discontinue this one. If not, please advise as to why it's not appropriate so that we may request a Prior Authorization. Please note, some preferred medications may still require a PA.  If the suggested medications have not been trialed and there are no contraindications to their use, the PA will not be submitted, as it will not be approved.

## 2024-01-23 ENCOUNTER — Other Ambulatory Visit: Payer: Self-pay

## 2024-01-23 ENCOUNTER — Encounter (HOSPITAL_BASED_OUTPATIENT_CLINIC_OR_DEPARTMENT_OTHER): Payer: Self-pay | Admitting: Cardiovascular Disease

## 2024-01-23 DIAGNOSIS — E039 Hypothyroidism, unspecified: Secondary | ICD-10-CM

## 2024-01-24 ENCOUNTER — Ambulatory Visit

## 2024-01-24 MED ORDER — PRALUENT 150 MG/ML ~~LOC~~ SOAJ: 150.0000 mg | SUBCUTANEOUS | 2 refills | Status: AC

## 2024-01-24 NOTE — Telephone Encounter (Signed)
 Dr Raford reviewed and is ok with the change to Praluent.  Mychart message to patient

## 2024-01-24 NOTE — Telephone Encounter (Signed)
 Patient agreeable to do the Praluent injections   Will forward to PA team

## 2024-01-25 ENCOUNTER — Other Ambulatory Visit (HOSPITAL_COMMUNITY): Payer: Self-pay

## 2024-01-25 DIAGNOSIS — Z8639 Personal history of other endocrine, nutritional and metabolic disease: Secondary | ICD-10-CM | POA: Diagnosis not present

## 2024-01-25 DIAGNOSIS — R5383 Other fatigue: Secondary | ICD-10-CM | POA: Diagnosis not present

## 2024-01-25 DIAGNOSIS — I10 Essential (primary) hypertension: Secondary | ICD-10-CM | POA: Diagnosis not present

## 2024-01-25 NOTE — Telephone Encounter (Signed)
 Pharmacy Patient Advocate Encounter   Received notification from Physician's Office that prior authorization for PRALUENT is required/requested.   Insurance verification completed.   The patient is insured through [facility].   Per test claim: PA required; PA submitted to above mentioned insurance via Latent Key/confirmation #/EOC [id number] Status is pending

## 2024-01-26 ENCOUNTER — Encounter: Payer: Self-pay | Admitting: Family Medicine

## 2024-01-29 ENCOUNTER — Other Ambulatory Visit (HOSPITAL_COMMUNITY): Payer: Self-pay

## 2024-01-29 ENCOUNTER — Ambulatory Visit: Admitting: Physical Therapy

## 2024-01-29 ENCOUNTER — Encounter: Payer: Self-pay | Admitting: Physical Therapy

## 2024-01-29 ENCOUNTER — Telehealth: Payer: Self-pay | Admitting: Pharmacy Technician

## 2024-01-29 DIAGNOSIS — R278 Other lack of coordination: Secondary | ICD-10-CM

## 2024-01-29 DIAGNOSIS — R252 Cramp and spasm: Secondary | ICD-10-CM | POA: Diagnosis not present

## 2024-01-29 DIAGNOSIS — R102 Pelvic and perineal pain unspecified side: Secondary | ICD-10-CM

## 2024-01-29 NOTE — Telephone Encounter (Signed)
 Patient Advocate Encounter   The patient was approved for a [employer] grant that will help cover the cost of praluent  Total amount awarded, 2500.  Effective: 12/30/23 - 12/28/24   APW:[id number] ERW:[id number] Hmnle:[id number] PI:[id number] [employer] ID: [id number]   Pharmacy provided with approval and processing information. Patient informed via

## 2024-01-29 NOTE — Telephone Encounter (Signed)
 Effective: 12/30/23 - 12/28/24   APW:[id number] ERW:[id number] Hmnle:[id number] PI:[id number]  Given to ht and pt

## 2024-01-29 NOTE — Telephone Encounter (Signed)
 PA completed and approved. Request routed to med assist for [facility] grant, please see separate encounter for details.

## 2024-01-29 NOTE — Telephone Encounter (Signed)
 Pharmacy Patient Advocate Encounter  Received notification from [facility] that Prior Authorization for PRALUENT  has been APPROVED from 01/11/24 to 02/20/98  ROUTING TO RX MED ASSIST POOL FOR [facility] GRANT.

## 2024-01-29 NOTE — Therapy (Signed)
 OUTPATIENT PHYSICAL THERAPY FEMALE PELVIC TREATMENT   Patient Name: Diana Wolfe MRN: [id number] DOB:June 19, 1950, 73 y.o., female Today's Date: 01/29/2024     END OF SESSION:  PT End of Session - 01/29/24 1019     Visit Number 16    Date for Recertification  03/27/24    Authorization Type Medicare/ [employer]    Authorization - Visit Number 16    Authorization - Number of Visits 20    PT Start Time 1020    PT Stop Time 1058    PT Time Calculation (min) 38 min    Activity Tolerance Patient tolerated treatment well    Behavior During Therapy WFL for tasks assessed/performed          Past Medical History:  Diagnosis Date   ADD (attention deficit disorder)    Allergy 1988   Anemia    Anxiety    Arthritis 2009   Depression    GERD (gastroesophageal reflux disease)    Hyperlipidemia    Hypertension    Hypothyroid    Osteoporosis    PONV (postoperative nausea and vomiting)    Sleep apnea    using cpap since 2025   Vitamin D  deficiency    Past Surgical History:  Procedure Laterality Date   BREAST BIOPSY Left 07/09/2019   CESAREAN SECTION     ESOPHAGOGASTRODUODENOSCOPY N/A 09/02/2015   Procedure: ESOPHAGOGASTRODUODENOSCOPY (EGD);  Surgeon: Lynwood Bohr, MD;  Location: [facility] ENDOSCOPY;  Service: Endoscopy;  Laterality: N/A;   ESOPHAGOGASTRODUODENOSCOPY N/A 06/05/2022   Procedure: ESOPHAGOGASTRODUODENOSCOPY (EGD);  Surgeon: Burnette Fallow, MD;  Location: [facility];  Service: Gastroenterology;  Laterality: N/A;   FOOT SURGERY Right    FOREIGN BODY REMOVAL N/A 06/05/2022   Procedure: FOREIGN BODY REMOVAL;  Surgeon: Burnette Fallow, MD;  Location: [facility]COPY;  Service: Gastroenterology;  Laterality: N/A;   FRACTURE SURGERY     IM NAILING TIBIA Left 01/09/2001   Dr. 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   Dysphagia 04/21/2020   Generalized abdominal pain 04/21/2020   Hematochezia 04/21/2020   Left lower quadrant pain 04/21/2020   History of colonic polyps 04/21/2020   Urticaria 04/21/2020   B12 deficiency 01/31/2019   Medication management 06/23/2014   Vaginal atrophy 03/27/2014   Hyperlipidemia    Hypertension    Anxiety    GERD (gastroesophageal reflux disease)    ADD (attention deficit disorder)    Depression    Vitamin D  deficiency    Hypothyroidism 04/25/2008   Iron deficiency anemia 04/25/2008   Obstructive sleep apnea 11/16/2007   Allergic rhinitis 11/16/2007   Headache 11/16/2007   PULMONARY EMBOLISM, HX OF 11/16/2007    PCP: Alvia Corean CROME, FNP  REFERRING PROVIDER: Alvia Corean CROME, FNP   REFERRING DIAG:  [phone] (ICD-10-CM) - Medication management  M62.89 (ICD-10-CM) - Pelvic floor dysfunction in female    THERAPY DIAG:  Cramp and spasm - Plan: PT plan of care cert/re-cert  Other lack of coordination - Plan: PT plan of care cert/re-cert  Pelvic pain - Plan: PT plan of care cert/re-cert  Rationale for Evaluation  and Treatment: Rehabilitation  ONSET DATE: 2021  SUBJECTIVE:                                                                                                                                                                                           SUBJECTIVE STATEMENT: MD wants me to drink caffeine due to blood pressure. The 4th dilator is not comfortable. I have not leaked urine in several week. I am using the electrical stimulation daily. The stimulation is helping.   PAIN:  Are you having pain? Yes NPRS scale: 7/10 Pain location: Vaginal  Pain type: aching Pain description:  intermittent   Aggravating factors: during vaginal penetration Relieving factors: no vaginal penetration  PRECAUTIONS: None  RED FLAGS: None   WEIGHT BEARING RESTRICTIONS: No  FALLS:  Has patient fallen in last 6 months? Yes. Number of falls 1 time slipped on ice.   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  URINATION: Pain with urination: No Fully empty bladder: Yes: sometimes will empty her bladder and 10 minutes have to urinate again Stream: Strong Urgency: Yes , getting out of car, sitting in house 08/07/23: feels the urgency after driving more than 1 hour; no urgency with sitting in house.  Frequency: night time 2-3 times: daytime: 6 times 07/31/23: wake up 2 times at night 08/07/23: wake up 1 time to urinate.  Leakage: Urge to void and Walking to the bathroom 08/07/23: urinary leakage 1 time since last week when she got out of the car and rushed to the bathroom Pads: No  INTERCOURSE:not sexually active at this time but in the future would like to be able to.    PREGNANCY: C-section deliveries 1  PROLAPSE: None   OBJECTIVE:  Note: Objective measures were completed at Evaluation unless otherwise noted.  DIAGNOSTIC FINDINGS:  none  PATIENT SURVEYS:  UIQ-7: 48 PFIQ-7: 44 08/16/23: UIQ-7: 33 PFIQ-7: 28 10/27/23: UIQ-7: 48 PFIQ-7: 44 01/29/24: UIQ-7: 38 PFIQ-7: 43  COGNITION: Overall cognitive status: Within functional limits for tasks assessed     SENSATION: Light touch: Appears intact   POSTURE: decreased lumbar lordosis   LUMBARAROM/PROM:  A/PROM A/PROM  eval A/PROM  10/27/23  Extension Decreased by 50% full   (Blank rows = not tested)  LOWER EXTREMITY ROM: bilateral  hip ROM is full   LOWER EXTREMITY MMT:  MMT Right eval Left eval Right 10/27/23 Left  10/27/23  Hip extension 4/5 4/5 5/5 5/5  Hip abduction 4/5 5/5 5/5 5/5   (Blank rows = not tested) PALPATION:    Pelvic Alignment: ASIS  Abdominal: decreased mobility of the tissue below the scar, decreased movement of the diaphragm, difficulty with opening her lower rib cage                External Perineal Exam: dry, redness along the vulva, white patch of skin along the inner left labia majora; Q-tip test was positive at 6, 11 and 1 O'Clock.                              Internal Pelvic Floor: pain with Q-tip therefore did not assess any further, when she bulged therapist did not see any prolapse, the vaginal canal was small  Patient confirms identification and approves PT to assess internal pelvic floor and treatment Yes  PELVIC MMT:not tested due to the pain   MMT 07/31/23 12/18/23 01/15/24  Vaginal 2/5 3/5 4 s 4/5 2 s  Internal Anal Sphincter     External Anal Sphincter     Puborectalis     Diastasis Recti     (Blank rows = not tested)         TODAY'S TREATMENT:  01/29/24 Neuromuscular re-education: Pelvic floor contraction training: Stand on vibration plate on strength setting for 30 seconds x 3 PLANK against the wall with diagonal hip flexion 20 x  Standing bilateral should extension with green band with alternate marching 20 x  Sitting on ball with shoulder flexion and using green band 15 x  Sitting on ball with green band horizontal abduction 20 x  Sitting on ball horizontal abduction with green band 20 x  Sitting on ball opposite arm and hip flexion 20 x  Supine with feet no ball and bringing ball back and forth engaging the lower abdomen 20 x  Self-care: Therapist has reviewed with patient on using the electrical stimulation for urgency and she is doing it correctly.     01/22/24 Neuromuscular re-education: Pelvic floor contraction training: Stand on vibration plate on  strength setting for 30 seconds x 3 Standing bilateral should extension with green band with alternate marching 20 x  Standing with holding green band at midline walking sideways 10 x each way PLANK against the wall with diagonal hip flexion 20 x  Step up on step with other leg doing hip flexion 20 x  Standing with one leg forward and diagonal to incorporate the pelvic floor 15 x each side Sitting hip abduction with yellow band with pelvic floor contraction Sitting hip internal rotation with yellow band 15 x each side Supine alternate shoulder and hip flexion with toe touch 20 x     01/15/24 Manual: Internal pelvic floor techniques: No emotional/communication barriers or cognitive limitation. Patient is motivated to learn. Patient understands and agrees with treatment goals and plan. PT explains patient will be examined in standing, sitting, and lying down to see how their muscles and joints work. When they are ready, they will be asked to remove their underwear so PT can examine their perineum. The patient is also given the option of providing their own chaperone as one is not provided in our facility. The patient also has the right and is explained the right to defer or refuse any part of the evaluation or treatment including the internal exam. With the patient's consent, PT will use one gloved  finger to gently assess the muscles of the pelvic floor, seeing how well it contracts and relaxes and if there is muscle symmetry. After, the patient will get dressed and PT and patient will discuss exam findings and plan of care. PT and patient discuss plan of care, schedule, attendance policy and HEP activities.  Therapist gloved finger in the vaginal canal with releasing around the bladder, release around the clitoris, manual work around the introitus elongating the tissue Neuromuscular re-education: Pelvic floor contraction training: Curl up with feet on mat and verbal cues to contract the  abdominals Supine pull the green band to the side 15 x each way working the obliques Supine bilateral shoulder extension with green band 20 x Diaphragmatic breathing with pelvic drop Self-care: Discussed with patient on the home TENS unit, urinary urgency with caffeine, progressing with dilator       PATIENT EDUCATION:  01/29/24 Education details: Access Code: [email or web address], abdominal scar massage Person educated: Patient Education method: Explanation, Demonstration, Tactile cues, Verbal cues, and Handouts Education comprehension: verbalized understanding, returned demonstration, verbal cues required, tactile cues required, and needs further education  HOME EXERCISE PROGRAM: 01/29/24 Access Code: [email or web address] URL: [email or web address] Date: 01/29/2024 Prepared by: Channing Pereyra  Program Notes sit on ball and massage the pelvic floor daily for several minutes.   Exercises - Seated Diaphragmatic Breathing  - 1 x daily - 7 x weekly - 3 sets - 10 reps - Seated Piriformis Stretch with Trunk Bend  - 1 x daily - 7 x weekly - 1 sets - 2 reps - 30 sec hold - Seated Happy Baby With Trunk Flexion For Pelvic Relaxation  - 1 x daily - 7 x weekly - 1 sets - 1 reps - 30 sec hold - Supine Piriformis Stretch with Leg Straight  - 1 x daily - 7 x weekly - 1 sets - 2 reps - 30 sec hold - Quadruped Pelvic Floor Contraction with Opposite Arm and Leg Lift  - 1 x daily - 1 x weekly - 2 sets - 10 reps - Posterior Chain Stretch  - 1 x daily - 3 x weekly - 1 sets - 5 reps - Butterfly Groin Stretch  - 1 x daily - 3 x weekly - 1 sets - 1 reps - 30 sec hold - Quadruped Yoga Block Lift Off  - 1 x daily - 3 x weekly - 1 sets - 10 reps - Curl Up with Reach  - 1 x daily - 7 x weekly - 3 sets - 10 reps - Hooklying Anti-Rotation Press With Anchored Resistance  - 1 x daily - 7 x weekly - 3 sets - 10 reps - Shoulder Extension with Resistance - 90/90 on Swiss Ball  - 1 x daily - 3 x weekly - 2 sets - 10 reps -  Seated Shoulder Flexion with Self-Anchored Resistance  - 1 x daily - 2 x weekly - 1 sets - 15 reps - Seated Shoulder Horizontal Abduction with Resistance - Palms Down  - 1 x daily - 2 x weekly - 1 sets - 20 reps - Seated March with Opposite Arm Flexion on Swiss Ball  - 1 x daily - 2 x weekly - 2 sets - 10 reps - Supine Hamstring Swiss Ball Curls/Dynamic Mobilization  - 1 x daily - 2 x weekly - 2 sets - 10 reps   ASSESSMENT:  CLINICAL IMPRESSION: Patient is a 73 y.o. female who was seen today for physical therapy  treatment for pelvis floor  dysfunction.  Patient has used the TENS unit and has less urgency. Patient reports the urinary urgency is 50% better since the initial evaluation. Patient has not leaked urine since the last visit.  She is having difficulty with brain fog and fatigue making it difficult to exercise.  Patient has full  lumbar ROM and strength of bilateral hips. Patient is on the 4th dilator compared to 3rd. Patient is able to walk to the bathroom with urgency and not leak urine. Patient wakes up 1-2 times to urinate at night. Patient will benefit from skilled therapy to improve pelvic floor tissue health, elongation of the vaginal canal and working on coordination to reduce urgency.   OBJECTIVE IMPAIRMENTS: decreased coordination, decreased ROM, decreased strength, increased fascial restrictions, increased muscle spasms, impaired tone, and pain.   ACTIVITY LIMITATIONS: continence, toileting, and locomotion level  PARTICIPATION LIMITATIONS: interpersonal relationship, driving, shopping, and community activity  PERSONAL FACTORS: Fitness, Time since onset of injury/illness/exacerbation, and 1-2 comorbidities: Hypothyroid; Hypertension; Cesarean section are also affecting patient's functional outcome.   REHAB POTENTIAL: Good  CLINICAL DECISION MAKING: Evolving/moderate complexity  EVALUATION COMPLEXITY: Moderate   GOALS: Goals reviewed with patient? Yes  SHORT TERM GOALS:  Target date: 06/22/23  Patient educated on vaginal moisturizers and understands how they are used and why to improve the health of the vaginal tissue.  Baseline: Goal status: Met 05/29/23  2.  Patient is able to perform diaphragmatic breathing to elongate her pelvic floor.  Baseline:  Goal status: met 07/24/23  3.  Patient understands was to perform perineal massage externally to start to relax the pelvic floor tissue.  Baseline:  Goal status: Met 07/31/23  4.  Patient is independent with flexibility exercises to indirectly elongate the pelvic floor.  Baseline:  Goal status: Met 07/24/23  5.  Patient educated on vaginal dilators to assist with the expansion of the pelvic floor.  Baseline:  Goal status: met 08/16/23  LONG TERM GOALS: Target date: 03/27/24  Patient independent with advanced HEP to increase strength of core and pelvic floor.  Baseline:  Goal status: ongoing 01/29/24  2.  Patient is able to use the dilator that is appropriate for her goal to have vaginal penetration with pain level </= 0-2/10.  Baseline: Patient is on the 4th one Goal status: ongoing 01/29/24  3.  Patient pelvic floor strength increased to reduce urinary leakage with urgency.  Baseline: urgency decreased by 50% Goal status: ongoing 01/29/24  4.  Patient is able to use the behavioral technique to reduce the urge to void so she is able to slowly walk to the bathroom without leaking urine and enter her house.  Baseline:  Goal status: Met 01/29/24  5.  Patient reports she is able to urinate 1-2 times at night due to using the urge to void technique.  Baseline:  Goal status: Met 08/07/23   PLAN:  PT FREQUENCY: 1-2x/week  PT DURATION: 12 weeks  PLANNED INTERVENTIONS: 97110-Therapeutic exercises, 97530- Therapeutic activity, 97112- Neuromuscular re-education, 97535- Self Care, [id number]- Manual therapy, [id number]- Electrical stimulation (unattended), 97035- Ultrasound, Patient/Family education, Dry Needling, Joint  mobilization, Spinal mobilization, Scar mobilization, Cryotherapy, Moist heat, and Biofeedback  PLAN FOR NEXT SESSION:   hip mobility and lumbar mobility, lower abdominal contraction   Channing Pereyra, PT 01/29/24 10:59 AM

## 2024-01-29 NOTE — Telephone Encounter (Signed)
 Will forward to PA team to follow up and see if correct cost/will qualify for [facility]

## 2024-01-30 ENCOUNTER — Encounter (HOSPITAL_BASED_OUTPATIENT_CLINIC_OR_DEPARTMENT_OTHER): Payer: Self-pay

## 2024-01-30 DIAGNOSIS — L820 Inflamed seborrheic keratosis: Secondary | ICD-10-CM | POA: Diagnosis not present

## 2024-01-30 DIAGNOSIS — L603 Nail dystrophy: Secondary | ICD-10-CM | POA: Diagnosis not present

## 2024-01-30 DIAGNOSIS — D485 Neoplasm of uncertain behavior of skin: Secondary | ICD-10-CM | POA: Diagnosis not present

## 2024-02-01 LAB — ALDOSTERONE + RENIN ACTIVITY W/ RATIO
Aldos/Renin Ratio: 2.6 (ref 0.0–30.0)
Aldosterone: 8.7 ng/dL (ref 0.0–30.0)
Renin Activity, Plasma: 3.331 ng/mL/h (ref 0.167–5.380)

## 2024-02-01 LAB — METANEPHRINES, PLASMA
Metanephrine, Free: <25 pg/mL (ref 0.0–88.0)
Normetanephrine, Free: 70.9 pg/mL (ref 0.0–285.2)

## 2024-02-01 LAB — IRON: Iron: 74 ug/dL (ref 27–139)

## 2024-02-01 NOTE — Telephone Encounter (Signed)
 Will you please call [facility] tomorrow (Friday) to check status of her labs? I anticipate still being processed but she seems anxious.   Krisna Omar S Mertha Clyatt, NP

## 2024-02-02 ENCOUNTER — Other Ambulatory Visit: Payer: Self-pay

## 2024-02-02 ENCOUNTER — Ambulatory Visit (HOSPITAL_BASED_OUTPATIENT_CLINIC_OR_DEPARTMENT_OTHER): Payer: Self-pay | Admitting: Family

## 2024-02-02 MED ORDER — MELOXICAM 15 MG PO TABS: 15.0000 mg | ORAL_TABLET | Freq: Every day | ORAL | 2 refills | Status: AC

## 2024-02-02 NOTE — Telephone Encounter (Signed)
 Results routed to patient via lab result encounter. All unremarkable.  Elick Aguilera S Leesa Leifheit, NP

## 2024-02-05 ENCOUNTER — Ambulatory Visit

## 2024-02-05 DIAGNOSIS — R030 Elevated blood-pressure reading, without diagnosis of hypertension: Secondary | ICD-10-CM

## 2024-02-05 DIAGNOSIS — R42 Dizziness and giddiness: Secondary | ICD-10-CM

## 2024-02-05 NOTE — Progress Notes (Unsigned)
 24 hour ambulatory blood pressure monitor applied to patients left arm using standard adult cuff.

## 2024-02-06 ENCOUNTER — Other Ambulatory Visit

## 2024-02-06 DIAGNOSIS — E039 Hypothyroidism, unspecified: Secondary | ICD-10-CM | POA: Diagnosis not present

## 2024-02-06 LAB — TSH: TSH: 1.68 u[IU]/mL (ref 0.35–5.50)

## 2024-02-08 NOTE — Telephone Encounter (Signed)
 Would recommend ask TR to review her 24h BP log when she is back on clinic 02/12/24.  Can let patient know will not be reviewed until next week. As I've not seen her in person, likely best reviewed by TR.   Would not suspect her tick bite from 7-8 years ago is causing issues now.  Pedrohenrique Mcconville S Misty Rago, NP

## 2024-02-12 DIAGNOSIS — R030 Elevated blood-pressure reading, without diagnosis of hypertension: Secondary | ICD-10-CM | POA: Diagnosis not present

## 2024-02-12 DIAGNOSIS — R42 Dizziness and giddiness: Secondary | ICD-10-CM

## 2024-02-13 ENCOUNTER — Other Ambulatory Visit: Payer: Self-pay | Admitting: *Deleted

## 2024-02-13 DIAGNOSIS — G4733 Obstructive sleep apnea (adult) (pediatric): Secondary | ICD-10-CM

## 2024-02-14 ENCOUNTER — Encounter: Payer: Self-pay | Admitting: Internal Medicine

## 2024-02-14 NOTE — Telephone Encounter (Signed)
 Concerns will be addressed at clinic visit. ED precautions already reviewed.   Umar Patmon S Akshith Moncus, NP

## 2024-02-20 NOTE — Telephone Encounter (Signed)
 Dr. Neysa does not have this order on his end. I have placed a new one.

## 2024-02-20 NOTE — Telephone Encounter (Signed)
 Dr. Neysa has signed the order. [facility] please send ASAP.

## 2024-02-20 NOTE — Telephone Encounter (Signed)
 Dr. Neysa can you please sign order from 12/23

## 2024-02-28 ENCOUNTER — Ambulatory Visit: Attending: Family Medicine | Admitting: Physical Therapy

## 2024-02-28 ENCOUNTER — Encounter: Payer: Self-pay | Admitting: Physical Therapy

## 2024-02-28 ENCOUNTER — Encounter: Payer: Self-pay | Admitting: Family Medicine

## 2024-02-28 DIAGNOSIS — R102 Pelvic and perineal pain unspecified side: Secondary | ICD-10-CM | POA: Insufficient documentation

## 2024-02-28 DIAGNOSIS — R252 Cramp and spasm: Secondary | ICD-10-CM | POA: Insufficient documentation

## 2024-02-28 DIAGNOSIS — R278 Other lack of coordination: Secondary | ICD-10-CM | POA: Insufficient documentation

## 2024-02-28 NOTE — Therapy (Signed)
 " OUTPATIENT PHYSICAL THERAPY FEMALE PELVIC TREATMENT   Patient Name: Diana Wolfe MRN: [id number] DOB:[birth year], 74 y.o., female Today's Date: 02/28/2024     END OF SESSION:  PT End of Session - 02/28/24 1147     Visit Number 17    Date for Recertification  03/27/24    Authorization Type Medicare/ [employer]    Authorization - Visit Number 17    Authorization - Number of Visits 20    PT Start Time 1145    PT Stop Time 1225    PT Time Calculation (min) 40 min    Activity Tolerance Patient tolerated treatment well    Behavior During Therapy WFL for tasks assessed/performed          Past Medical History:  Diagnosis Date   ADD (attention deficit disorder)    Allergy 1988   Anemia    Anxiety    Arthritis 2009   Depression    GERD (gastroesophageal reflux disease)    Hyperlipidemia    Hypertension    Hypothyroid    Osteoporosis    PONV (postoperative nausea and vomiting)    Sleep apnea    using cpap since 2025   Vitamin D  deficiency    Past Surgical History:  Procedure Laterality Date   BREAST BIOPSY Left 07/09/2019   CESAREAN SECTION     ESOPHAGOGASTRODUODENOSCOPY N/A 09/02/2015   Procedure: ESOPHAGOGASTRODUODENOSCOPY (EGD);  Surgeon: Diana Bohr, MD;  Location: [facility] ENDOSCOPY;  Service: Endoscopy;  Laterality: N/A;   ESOPHAGOGASTRODUODENOSCOPY N/A 06/05/2022   Procedure: ESOPHAGOGASTRODUODENOSCOPY (EGD);  Surgeon: Diana Fallow, MD;  Location: [facility];  Service: Gastroenterology;  Laterality: N/A;   FOOT SURGERY Right    FOREIGN BODY REMOVAL N/A 06/05/2022   Procedure: FOREIGN BODY REMOVAL;  Surgeon: Diana Fallow, MD;  Location: [facility] ENDOSCOPY;  Service: Gastroenterology;  Laterality: N/A;   FRACTURE SURGERY     IM NAILING TIBIA Left 01/09/2001   Dr. 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   Left foot pain 11/26/2023   Postmenopausal  estrogen deficiency 11/26/2023   Osteopenia of neck of left femur 11/26/2023   Insomnia 12/12/2022   Hot flashes due to menopause 12/12/2022   Prediabetes 12/12/2022   Diaphragmatic hernia 04/21/2020   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   Dysphagia 04/21/2020   Generalized abdominal pain 04/21/2020   Hematochezia 04/21/2020   Left lower quadrant pain 04/21/2020   History of colonic polyps 04/21/2020   Urticaria 04/21/2020   B12 deficiency 01/31/2019   Medication management 06/23/2014   Vaginal atrophy 03/27/2014   Hyperlipidemia    Hypertension    Anxiety    GERD (gastroesophageal reflux disease)    ADD (attention deficit disorder)    Depression    Vitamin D  deficiency    Hypothyroidism 04/25/2008   Iron deficiency anemia 04/25/2008   Obstructive sleep apnea 11/16/2007   Allergic rhinitis 11/16/2007   Headache 11/16/2007   PULMONARY EMBOLISM, HX OF 11/16/2007    PCP: Diana Corean CROME, FNP  REFERRING PROVIDER: Alvia Corean CROME, FNP   REFERRING DIAG:  [phone] (ICD-10-CM) - Medication management  M62.89 (ICD-10-CM) - Pelvic floor dysfunction in female    THERAPY DIAG:  Cramp and spasm  Other lack of coordination  Pelvic pain  Rationale for Evaluation and Treatment: Rehabilitation  ONSET DATE: 2021  SUBJECTIVE:  SUBJECTIVE STATEMENT: I am still on the 4th dilator and it is more comfortable. I have not had long rides. I have not had huge urgency. I have not used the home TENS unit. I had the strong urgency several weeks ago. I have one more dilator to go for my goal. I am drinking more caffeine for the blood pressure. I have had urgency but no leakage in the underwear.   PAIN:  Are you having pain? Yes NPRS scale: 7/10 Pain location: Vaginal  Pain type:  aching Pain description: intermittent   Aggravating factors: during vaginal penetration Relieving factors: no vaginal penetration  PRECAUTIONS: None  RED FLAGS: None   WEIGHT BEARING RESTRICTIONS: No  FALLS:  Has patient fallen in last 6 months? Yes. Number of falls 1 time slipped on ice.   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  URINATION: Pain with urination: No Fully empty bladder: Yes: sometimes will empty her bladder and 10 minutes have to urinate again Stream: Strong Urgency: Yes , getting out of car, sitting in house 08/07/23: feels the urgency after driving more than 1 hour; no urgency with sitting in house.  Frequency: night time 2-3 times: daytime: 6 times 07/31/23: wake up 2 times at night 08/07/23: wake up 1 time to urinate.  Leakage: Urge to void and Walking to the bathroom 08/07/23: urinary leakage 1 time since last week when she got out of the car and rushed to the bathroom Pads: No  INTERCOURSE:not sexually active at this time but in the future would like to be able to.    PREGNANCY: C-section deliveries 1  PROLAPSE: None   OBJECTIVE:  Note: Objective measures were completed at Evaluation unless otherwise noted.  DIAGNOSTIC FINDINGS:  none  PATIENT SURVEYS:  UIQ-7: 48 PFIQ-7: 44 08/16/23: UIQ-7: 33 PFIQ-7: 28 10/27/23: UIQ-7: 48 PFIQ-7: 44 01/29/24: UIQ-7: 38 PFIQ-7: 43  COGNITION: Overall cognitive status: Within functional limits for tasks assessed     SENSATION: Light touch: Appears intact   POSTURE: decreased lumbar lordosis   LUMBARAROM/PROM:  A/PROM A/PROM  eval A/PROM  10/27/23  Extension Decreased by 50% full   (Blank rows = not tested)  LOWER  EXTREMITY ROM: bilateral hip ROM is full   LOWER EXTREMITY MMT:  MMT Right eval Left eval Right 10/27/23 Left 10/27/23  Hip extension 4/5 4/5 5/5 5/5  Hip abduction 4/5 5/5 5/5 5/5   (Blank rows = not tested) PALPATION:    Pelvic Alignment: ASIS  Abdominal: decreased mobility of the tissue below the scar, decreased movement of the diaphragm, difficulty with opening her lower rib cage                External Perineal Exam: dry, redness along the vulva, white patch of skin along the inner left labia majora; Q-tip test was positive at 6, 11 and 1 O'Clock.                              Internal Pelvic Floor: pain with Q-tip therefore did not assess any further, when she bulged therapist did not see any prolapse, the vaginal canal was small  Patient confirms identification and approves PT to assess internal pelvic floor  and treatment Yes  PELVIC MMT:not tested due to the pain   MMT 07/31/23 12/18/23 01/15/24  Vaginal 2/5 3/5 4 s 4/5 2 s  Internal Anal Sphincter     External Anal Sphincter     Puborectalis     Diastasis Recti     (Blank rows = not tested)         TODAY'S TREATMENT:  02/28/24 Exercises: Stretches/mobility: Sitting piriformis stretch holding 30 sec each Sitting happy baby holding 30 sec  Supine pull knee across the hip holding 30 sec each Strengthening: Supine with feet on ball and bring knees to chest 20 x  Supine with feet on ball and bring knees to chest 20 x  with bilateral shoulder extension with green band Supine with feet on ball rotate trunk with green band as resistance 20 x each PLANK against the wall with diagonal hip flexion 20 x  Hands on wall lifting opposite arm and leg 20 x  Lunges with holding onto the counter 15 x each side with tactile cues to engage the abdomen    01/29/24 Neuromuscular re-education: Pelvic floor contraction training: Stand on vibration plate on strength setting for 30 seconds x 3 PLANK against the wall with diagonal hip  flexion 20 x  Standing bilateral should extension with green band with alternate marching 20 x  Sitting on ball with shoulder flexion and using green band 15 x  Sitting on ball with green band horizontal abduction 20 x  Sitting on ball horizontal abduction with green band 20 x  Sitting on ball opposite arm and hip flexion 20 x  Supine with feet no ball and bringing ball back and forth engaging the lower abdomen 20 x  Self-care: Therapist has reviewed with patient on using the electrical stimulation for urgency and she is doing it correctly.     01/22/24 Neuromuscular re-education: Pelvic floor contraction training: Stand on vibration plate on strength setting for 30 seconds x 3 Standing bilateral should extension with green band with alternate marching 20 x  Standing with holding green band at midline walking sideways 10 x each way PLANK against the wall with diagonal hip flexion 20 x  Step up on step with other leg doing hip flexion 20 x  Standing with one leg forward and diagonal to incorporate the pelvic floor 15 x each side Sitting hip abduction with yellow band with pelvic floor contraction Sitting hip internal rotation with yellow band 15 x each side Supine alternate shoulder and hip flexion with toe touch 20 x         PATIENT EDUCATION:  01/29/24 Education details: Access Code: [email or web address], abdominal scar massage Person educated: Patient Education method: Explanation, Demonstration, Tactile cues, Verbal cues, and Handouts Education comprehension: verbalized understanding, returned demonstration, verbal cues required, tactile cues required, and needs further education  HOME EXERCISE PROGRAM: 01/29/24 Access Code: [email or web address] URL: [email or web address] Date: 01/29/2024 Prepared by: Channing Pereyra  Program Notes sit on ball and massage the pelvic floor daily for several minutes.   Exercises - Seated Diaphragmatic Breathing  - 1 x daily - 7 x weekly - 3 sets - 10  reps - Seated Piriformis Stretch with Trunk Bend  - 1 x daily - 7 x weekly - 1 sets - 2 reps - 30 sec hold - Seated Happy Baby With Trunk Flexion For Pelvic Relaxation  - 1 x daily - 7 x weekly - 1 sets - 1 reps - 30 sec hold - Supine Piriformis Stretch with Leg Straight  -  1 x daily - 7 x weekly - 1 sets - 2 reps - 30 sec hold - Quadruped Pelvic Floor Contraction with Opposite Arm and Leg Lift  - 1 x daily - 1 x weekly - 2 sets - 10 reps - Posterior Chain Stretch  - 1 x daily - 3 x weekly - 1 sets - 5 reps - Butterfly Groin Stretch  - 1 x daily - 3 x weekly - 1 sets - 1 reps - 30 sec hold - Quadruped Yoga Block Lift Off  - 1 x daily - 3 x weekly - 1 sets - 10 reps - Curl Up with Reach  - 1 x daily - 7 x weekly - 3 sets - 10 reps - Hooklying Anti-Rotation Press With Anchored Resistance  - 1 x daily - 7 x weekly - 3 sets - 10 reps - Shoulder Extension with Resistance - 90/90 on Swiss Ball  - 1 x daily - 3 x weekly - 2 sets - 10 reps - Seated Shoulder Flexion with Self-Anchored Resistance  - 1 x daily - 2 x weekly - 1 sets - 15 reps - Seated Shoulder Horizontal Abduction with Resistance - Palms Down  - 1 x daily - 2 x weekly - 1 sets - 20 reps - Seated March with Opposite Arm Flexion on Swiss Ball  - 1 x daily - 2 x weekly - 2 sets - 10 reps - Supine Hamstring Swiss Ball Curls/Dynamic Mobilization  - 1 x daily - 2 x weekly - 2 sets - 10 reps   ASSESSMENT:  CLINICAL IMPRESSION: Patient is a 74 y.o. female who was seen today for physical therapy  treatment for pelvis floor dysfunction.  Patient has not leaked urine as she is walking to the bathroom for several weeks. She has the urgency to urinate but is able to walk to the bathroom slowly. She reports less urgency. She is on the 4th dilator and having less difficulty with it.  Patient will benefit from skilled therapy to improve pelvic floor tissue health, elongation of the vaginal canal and working on coordination to reduce urgency.   OBJECTIVE  IMPAIRMENTS: decreased coordination, decreased ROM, decreased strength, increased fascial restrictions, increased muscle spasms, impaired tone, and pain.   ACTIVITY LIMITATIONS: continence, toileting, and locomotion level  PARTICIPATION LIMITATIONS: interpersonal relationship, driving, shopping, and community activity  PERSONAL FACTORS: Fitness, Time since onset of injury/illness/exacerbation, and 1-2 comorbidities: Hypothyroid; Hypertension; Cesarean section are also affecting patient's functional outcome.   REHAB POTENTIAL: Good  CLINICAL DECISION MAKING: Evolving/moderate complexity  EVALUATION COMPLEXITY: Moderate   GOALS: Goals reviewed with patient? Yes  SHORT TERM GOALS: Target date: 06/22/23  Patient educated on vaginal moisturizers and understands how they are used and why to improve the health of the vaginal tissue.  Baseline: Goal status: Met 05/29/23  2.  Patient is able to perform diaphragmatic breathing to elongate her pelvic floor.  Baseline:  Goal status: met 07/24/23  3.  Patient understands was to perform perineal massage externally to start to relax the pelvic floor tissue.  Baseline:  Goal status: Met 07/31/23  4.  Patient is independent with flexibility exercises to indirectly elongate the pelvic floor.  Baseline:  Goal status: Met 07/24/23  5.  Patient educated on vaginal dilators to assist with the expansion of the pelvic floor.  Baseline:  Goal status: met 08/16/23  LONG TERM GOALS: Target date: 03/27/24  Patient independent with advanced HEP to increase strength of core and pelvic floor.  Baseline:  Goal status: ongoing 01/29/24  2.  Patient is able to use the dilator that is appropriate for her goal to have vaginal penetration with pain level </= 0-2/10.  Baseline: Patient is on the 4th one Goal status: ongoing 01/29/24  3.  Patient pelvic floor strength increased to reduce urinary leakage with urgency.  Baseline: urgency decreased by 50% Goal status:  ongoing 01/29/24  4.  Patient is able to use the behavioral technique to reduce the urge to void so she is able to slowly walk to the bathroom without leaking urine and enter her house.  Baseline:  Goal status: Met 01/29/24  5.  Patient reports she is able to urinate 1-2 times at night due to using the urge to void technique.  Baseline:  Goal status: Met 08/07/23   PLAN:  PT FREQUENCY: 1-2x/week  PT DURATION: 12 weeks  PLANNED INTERVENTIONS: 97110-Therapeutic exercises, 97530- Therapeutic activity, 97112- Neuromuscular re-education, 97535- Self Care, [id number]- Manual therapy, [id number]- Electrical stimulation (unattended), 97035- Ultrasound, Patient/Family education, Dry Needling, Joint mobilization, Spinal mobilization, Scar mobilization, Cryotherapy, Moist heat, and Biofeedback  PLAN FOR NEXT SESSION:   hip mobility and lumbar mobility, lower abdominal contraction; prepare for discharge   Channing Pereyra, PT 02/28/2024 12:28 PM  "

## 2024-03-04 ENCOUNTER — Encounter (HOSPITAL_BASED_OUTPATIENT_CLINIC_OR_DEPARTMENT_OTHER): Payer: Self-pay | Admitting: Family

## 2024-03-04 ENCOUNTER — Ambulatory Visit (INDEPENDENT_AMBULATORY_CARE_PROVIDER_SITE_OTHER): Admitting: Family

## 2024-03-04 VITALS — BP 150/72 | HR 72 | Ht 62.0 in | Wt 159.7 lb

## 2024-03-04 DIAGNOSIS — I10 Essential (primary) hypertension: Secondary | ICD-10-CM

## 2024-03-04 DIAGNOSIS — G4733 Obstructive sleep apnea (adult) (pediatric): Secondary | ICD-10-CM | POA: Diagnosis not present

## 2024-03-04 DIAGNOSIS — R5383 Other fatigue: Secondary | ICD-10-CM | POA: Diagnosis not present

## 2024-03-04 NOTE — Patient Instructions (Signed)
 Medication Instructions:   Your physician recommends that you continue on your current medications as directed. Please refer to the Current Medication list given to you today.   *If you need a refill on your cardiac medications before your next appointment, please call your pharmacy*  Lab Work:  None ordered.  If you have labs (blood work) drawn today and your tests are completely normal, you will receive your results only by: MyChart Message (if you have MyChart) OR A paper copy in the mail If you have any lab test that is abnormal or we need to change your treatment, we will call you to review the results.  Testing/Procedures:  Your physician has requested that you have an echocardiogram. Echocardiography is a painless test that uses sound waves to create images of your heart. It provides your doctor with information about the size and shape of your heart and how well your hearts chambers and valves are working. This procedure takes approximately one hour. There are no restrictions for this procedure. Please do NOT wear cologne, perfume or lotions (deodorant is allowed). Please arrive 15 minutes prior to your appointment time.   Follow-Up: At [facility], you and your health needs are our priority.  As part of our continuing mission to provide you with exceptional heart care, our providers are all part of one team.  This team includes your primary Cardiologist (physician) and Advanced Practice Providers or APPs (Physician Assistants and Nurse Practitioners) who all work together to provide you with the care you need, when you need it.  Your next appointment:   3 month(s)  Provider:   Annabella Scarce, MD    We recommend signing up for the patient portal called MyChart.  Sign up information is provided on this After Visit Summary.  MyChart is used to connect with patients for Virtual Visits (Telemedicine).  Patients are able to view lab/test results, encounter notes,  upcoming appointments, etc.  Non-urgent messages can be sent to your provider as well.   To learn more about what you can do with MyChart, go to [email or web address].   Other Instructions

## 2024-03-04 NOTE — Progress Notes (Unsigned)
 "  [facility] Assessment:    Date:  03/04/2024   ID:  Diana Wolfe, DOB December 13, 1950, MRN [id number]  PCP:  Alvia Corean CROME, FNP  Cardiologist:  Soyla DELENA Merck, MD  Nephrologist:  Referring MD: Alvia Corean CROME, FNP   CC: Hypertension  History of Present Illness:    Diana Wolfe is a 74 y.o. female with a hx of elevated blood pressure, ADHD, hypothroidism, HLD, aortic atherosclerosis, OSA. Here to follow up in the [facility].   Established with [facility] 01/22/24. Noted BP more labile after having COVID-19. Lonstanding history of labile BP, often high at medical visits and controlleda t home. She was not requiring Hydralazine  PRN for SBP >190. She noted fatigue, brain fog x 3-4 mos which she attributed to low diastolic BP though was normal in clinic. Reported compliance with CPAP with no improvement in energy despite using for 1 year. She was advised against supplement use. [id number] initiated for aortic atherosclerosis and HLD.   24h BP monitor with averall average BP 159/72 (160/75 when awake and 153/64 when asleep). Based on this Dr. Raford recommended low dose Losartan  25mg  daily. Plasma renin-aldosteron, metanephrines, TSH unremarkable. No iron deficiency. Diana Wolfe sent MyChart with low diastolic BP which she attributed ot low energy, addition of Losartan  deferred until follow up.   Presents today for follow up independently. Since last seen stopped her Timolol eyedrops for glaucoma which she attributes to improving her diastolic blood pressure. Reports her diastolic blood pressure drops low but not as frequently. Previously discussed via MyChart this would be uncommon side effect of Timolol eyedrop. Encouraged to follow up with her eye doctor. Reports fatigue, feeling in a fog if diastolic BP <70. Reports her systolic blood pressure at home is 135-145 which she reports is normal for me but as high as 170-180  which improves after 30 minutes. She has not had to take her PRN Hydralazine  for SBP >190. Reports her energy level is a little better. Still with issues with energy level but not as frequent. Occasional lightheadedness, dizziness on her days she feels worse.   She has completed 2 Praluent  injection without issue.   She inquires about tick borne illness, discussed would be followed by primary care. Encouraged to schedule sooner follow up regarding fatigue.   BP in clinic: 150/72 Her wrist cuff: 141/77  Previous antihypertensives: PRN Hydralazine   Past Medical History:  Diagnosis Date   ADD (attention deficit disorder)    Allergy 1988   Anemia    Anxiety    Arthritis 2009   Depression    GERD (gastroesophageal reflux disease)    Hyperlipidemia    Hypertension    Hypothyroid    Osteoporosis    PONV (postoperative nausea and vomiting)    Sleep apnea    using cpap since 2025   Vitamin D  deficiency     Past Surgical History:  Procedure Laterality Date   BREAST BIOPSY Left 07/09/2019   CESAREAN SECTION     ESOPHAGOGASTRODUODENOSCOPY N/A 09/02/2015   Procedure: ESOPHAGOGASTRODUODENOSCOPY (EGD);  Surgeon: Lynwood Bohr, MD;  Location: [facility] ENDOSCOPY;  Service: Endoscopy;  Laterality: N/A;   ESOPHAGOGASTRODUODENOSCOPY N/A 06/05/2022   Procedure: ESOPHAGOGASTRODUODENOSCOPY (EGD);  Surgeon: Burnette Fallow, MD;  Location: [facility];  Service: Gastroenterology;  Laterality: N/A;   FOOT SURGERY Right    FOREIGN BODY REMOVAL N/A 06/05/2022   Procedure: FOREIGN BODY REMOVAL;  Surgeon: Burnette Fallow, MD;  Location: [facility] ENDOSCOPY;  Service: Gastroenterology;  Laterality: N/A;  FRACTURE SURGERY     IM NAILING TIBIA Left 01/09/2001   Dr. Jerrell [place], [place]   TONSILLECTOMY  2002    Current Medications: Active Medications[1]   Allergies:   Levofloxacin, Buspirone , Cefprozil, Codeine, Hydrocodone-acetaminophen , Hydrocodone-acetaminophen , Morphine, Nitrofuran derivatives,  Pravastatin, and Lexapro  [escitalopram  oxalate]   Social History   Socioeconomic History   Marital status: Divorced    Spouse name: Not on file   Number of children: Not on file   Years of education: Not on file   Highest education level: Bachelor's degree (e.g., BA, AB, BS)  Occupational History   Not on file  Tobacco Use   Smoking status: Never    Passive exposure: Past   Smokeless tobacco: Never  Substance and Sexual Activity   Alcohol use: Yes    Alcohol/week: 1.0 standard drink of alcohol    Types: 1 Glasses of wine per week    Comment: occasional   Drug use: No   Sexual activity: Not Currently  Other Topics Concern   Not on file  Social History Narrative   Not on file   Social Drivers of Health   Tobacco Use: Low Risk (03/04/2024)   Patient History    Smoking Tobacco Use: Never    Smokeless Tobacco Use: Never    Passive Exposure: Past  Financial Resource Strain: Medium Risk (01/22/2024)   Overall Financial Resource Strain (CARDIA)    Difficulty of Paying Living Expenses: Somewhat hard  Food Insecurity: No Food Insecurity (01/22/2024)   Epic    Worried About Programme Researcher, Broadcasting/film/video in the Last Year: Never true    Ran Out of Food in the Last Year: Never true  Transportation Needs: No Transportation Needs (01/22/2024)   Epic    Lack of Transportation (Medical): No    Lack of Transportation (Non-Medical): No  Physical Activity: Inactive (01/22/2024)   Exercise Vital Sign    Days of Exercise per Week: 0 days    Minutes of Exercise per Session: 0 min  Stress: Stress Concern Present (01/22/2024)   [employer] of Occupational Health - Occupational Stress Questionnaire    Feeling of Stress: Rather much  Social Connections: Unknown (01/22/2024)   Social Connection and Isolation Panel    Frequency of Communication with Friends and Family: Once a week    Frequency of Social Gatherings with Friends and Family: Never    Attends Religious Services: Not on Administrator, Sports or Organizations: Yes    Attends Engineer, Structural: More than 4 times per year    Marital Status: Divorced  Recent Concern: Social Connections - Socially Isolated (10/25/2023)   Social Connection and Isolation Panel    Frequency of Communication with Friends and Family: Never    Frequency of Social Gatherings with Friends and Family: Never    Attends Religious Services: More than 4 times per year    Active Member of Clubs or Organizations: No    Attends Banker Meetings: Never    Marital Status: Divorced  Depression (PHQ2-9): Low Risk (10/25/2023)   Depression (PHQ2-9)    PHQ-2 Score: 0  Alcohol Screen: Low Risk (01/22/2024)   Alcohol Screen    Last Alcohol Screening Score (AUDIT): 1  Housing: Low Risk (01/22/2024)   Epic    Unable to Pay for Housing in the Last Year: No    Number of Times Moved in the Last Year: 0    Homeless in the Last Year: No  Utilities:  Not At Risk (01/22/2024)   Epic    Threatened with loss of utilities: No  Health Literacy: Inadequate Health Literacy (01/22/2024)   B1300 Health Literacy    Frequency of need for help with medical instructions: Sometimes     Family History: The patient's family history includes Asthma in her mother and sister; Breast cancer in her cousin and mother; Cancer in her father and paternal grandmother; Cancer (age of onset: 37) in her mother; Diabetes in her mother; Heart attack (age of onset: 22 - 60) in her father; Heart disease in her father and paternal grandfather; Hypertension in her father; Stroke in her maternal grandmother and mother.  ROS:   Please see the history of present illness.     All other systems reviewed and are negative.  EKGs/Labs/Other Studies Reviewed:         Recent Labs: 11/20/2023: ALT 20; BUN 16; Creatinine, Ser 0.78; Hemoglobin 15.4; Platelets 464.0; Potassium 4.2; Sodium 138 02/06/2024: TSH 1.68   Recent Lipid Panel    Component Value Date/Time   CHOL 231 (H)  06/05/2023 1229   TRIG 192.0 (H) 06/05/2023 1229   HDL 59.10 06/05/2023 1229   CHOLHDL 4 06/05/2023 1229   VLDL 38.4 06/05/2023 1229   LDLCALC 134 (H) 06/05/2023 1229   LDLCALC 125 (H) 10/18/2022 1158   LDLDIRECT 138.2 04/25/2008 1514    Physical Exam:   VS:  BP (!) 150/72 (BP Location: Right Arm, Patient Position: Sitting, Cuff Size: Normal)   Pulse 72   Ht 5' 2 (1.575 m)   Wt 159 lb 11.2 oz (72.4 kg)   SpO2 96%   BMI 29.21 kg/m  , BMI Body mass index is 29.21 kg/m. GENERAL:  Well appearing HEENT: Pupils equal round and reactive, fundi not visualized, oral mucosa unremarkable NECK:  No jugular venous distention, waveform within normal limits, carotid upstroke brisk and symmetric, no bruits, no thyromegaly LYMPHATICS:  No cervical adenopathy LUNGS:  Clear to auscultation bilaterally HEART:  RRR.  PMI not displaced or sustained,S1 and S2 within normal limits, no S3, no S4, no clicks, no rubs, no murmurs ABD:  Flat, positive bowel sounds normal in frequency in pitch, no bruits, no rebound, no guarding, no midline pulsatile mass, no hepatomegaly, no splenomegaly EXT:  2 plus pulses throughout, no edema, no cyanosis no clubbing SKIN:  No rashes no nodules NEURO:  Cranial nerves II through XII grossly intact, motor grossly intact throughout PSYCH:  Cognitively intact, oriented to person place and time   ASSESSMENT/PLAN:    HTN - BP goal <130/80. BP not at goal. Reviewed 24h BP monitor with overall average BP 159/72. She reports feeling poorly with DBP <70. We reviewed the dangers of elevated blood pressure, that home BP cuff is not accurate.BP in clinic: 150/72 versus her her wrist cuff: 141/77. Discussed addition of  Losartan  25mg  daily as recommended by Dr. Raford based on 24h BP monitor, she politely declines. Reviewed that Timolol eye drop was unlikely to be impacting her diastolic blood pressure, she has discontinued and plans to discuss with optometry. Update echo to assess  diastolic function and strain.  Continue Hydralazine  10mg  PRN for SBP >190.  Aortic atherosclerosis / HLD - Stable with no anginal symptoms. Continue Praluent  150mg  q14 days.  Prior statin intolerance. Recommend aiming for 150 minutes of moderate intensity activity per week and following a heart healthy diet.    Fatigue - 02/06/24 normal TSH. Encouraged to follow up with primary care regarding fatigue. Unchanged with addition  of CPAP.  OSA - wearing CPAP regularly. Continued CPAP compliance encouraged.   Screening for Secondary Hypertension:     07/07/2022    4:44 PM 07/07/2022    4:45 PM 01/22/2024   12:12 PM  Causes  Drugs/Herbals  Screened Screened     - Comments  No OTC agents Limits caffeine. Limited EtOH.  Rare meloxicam .  BP support Dr. Minette.  Renovascular HTN Screened       - Comments Renal duplex 03/2022 bilateral 1-59% renal artery stenosis    Sleep Apnea  Screened Screened     - Comments  Manged with dental device   Thyroid  Disease Screened  Screened     - Comments Hypothyroidism appropriately managed by PCP  treated with levothyroxine   Hyperaldosteronism Not Screened -- Screened     - Comments  Not screened as BP controlled. If BP uncontrolled in future, consider plasma renin aldosterone check renin and aldosterone  Pheochromocytoma Screened  Screened     - Comments CT 10/2020 normal adrenals without pheochromocytoma  check metanephrines  Cushing's Syndrome N/A  N/A     - Comments Non cushingoid appearance    Hyperparathyroidism   N/A  Coarctation of the Aorta N/A  Screened     - Comments BP symmetrical  BP symmetric  Compliance   Screened    Relevant Labs/Studies:    Latest Ref Rng & Units 11/20/2023    2:23 PM 06/05/2023   12:29 PM 10/18/2022   11:58 AM  Basic Labs  Sodium 135 - 145 mEq/L 138  137  142   Potassium 3.5 - 5.1 mEq/L 4.2  3.8  4.3   Creatinine 0.40 - 1.20 mg/dL 9.21  9.13  9.15        Latest Ref Rng & Units 02/06/2024   11:42 AM 11/20/2023     2:23 PM  Thyroid    TSH 0.35 - 5.50 uIU/mL 1.68  0.44        Latest Ref Rng & Units 01/25/2024   11:41 AM  Renin/Aldosterone   Aldosterone 0.0 - 30.0 ng/dL 8.7   Aldos/Renin Ratio 0.0 - 30.0 2.6        Latest Ref Rng & Units 01/25/2024   11:41 AM  Metanephrines/Catecholamines   Metanephrines 0.0 - 88.0 pg/mL <25.0   Normetanephrines  0.0 - 285.2 pg/mL 70.9        Latest Ref Rng & Units 11/26/2019    4:55 PM  Cortisol  Cortisol  mcg/dL 4.2        08/21/7973    9:58 AM  Renovascular   Renal Artery US  Completed Yes      Disposition:    FU with Dr. Raford in 3 months   Medication Adjustments/Labs and Tests Ordered: Current medicines are reviewed at length with the patient today.  Concerns regarding medicines are outlined above.  No orders of the defined types were placed in this encounter.  No orders of the defined types were placed in this encounter.    Signed, Reche GORMAN Finder, NP  03/04/2024 2:42 PM    [facility]     [1]  Current Meds  Medication Sig   Alirocumab  (PRALUENT ) 150 MG/ML SOAJ Inject 1 mL (150 mg total) into the skin every 14 (fourteen) days.   amphetamine -dextroamphetamine  (ADDERALL) 20 MG tablet Take 1 tablet (20 mg total) by mouth daily.   amphetamine -dextroamphetamine  (ADDERALL) 20 MG tablet Take 1 tablet (20 mg total) by mouth daily.   amphetamine -dextroamphetamine  (ADDERALL) 20 MG  tablet Take 1 tablet (20 mg total) by mouth daily.   amphetamine -dextroamphetamine  (ADDERALL) 20 MG tablet Take 1 tablet (20 mg total) by mouth daily.   amphetamine -dextroamphetamine  (ADDERALL) 20 MG tablet Take 1 tablet (20 mg total) by mouth daily.   aspirin 81 MG chewable tablet 3-4 days a week   cetirizine (ZYRTEC) 10 MG tablet PRN   Cholecalciferol (VITAMIN D3) 10 MCG (400 UNIT) CAPS Take 2 capsules by mouth daily.   Cyanocobalamin  (B-12) 1000 MCG TABS Take by mouth.   diclofenac (VOLTAREN) 75 MG EC tablet Take 75 mg by mouth 2 (two)  times daily.   estradiol  (ESTRACE  VAGINAL) 0.1 MG/GM vaginal cream Place 1 Applicatorful vaginally at bedtime.   hydrALAZINE  (APRESOLINE ) 10 MG tablet Take half tablet as needed for systolic blood pressure (top number) more than 190. If blood pressure remains more than 190 one hour after taking half tablet, may take additional half tablet.   levothyroxine  (SYNTHROID ) 75 MCG tablet TAKE 1 AND 1/2 TABLETS BY MOUTH ON MONDAYS, WEDNESDAYS AND FRIDAYS THEN TAKE 1 TABLET ON OTHER DAYS OF THE WEEK. TAKE ON EMPTY STOMACH WITH ONLY WATER FOR 30 MINUTES AND NO ANTACIDS, CALCIUM  OR MAGNESIUM FOR 4 HOURS AND AVOID BIOTIN   meclizine  (ANTIVERT ) 25 MG tablet 1/2-1 pill up to 3 times daily for motion sickness/dizziness   meloxicam  (MOBIC ) 15 MG tablet Take 1 tablet (15 mg total) by mouth daily.   Multiple Vitamin (MULTIVITAMIN) capsule Take 1 capsule by mouth daily.   omeprazole  (PRILOSEC) 40 MG capsule Take 1 capsule (40 mg total) by mouth daily. TAKE ONE CAPSULE BY MOUTH DAILY TO PREVENT HEARTBURN AND INDIGESTION (Patient taking differently: Take 40 mg by mouth as needed. TAKE ONE CAPSULE BY MOUTH DAILY TO PREVENT HEARTBURN AND INDIGESTION)   vitamin C (ASCORBIC ACID) 500 MG tablet Take 1,200 mg by mouth daily.   Vitamin D , Ergocalciferol , (DRISDOL ) 1.25 MG (50000 UNIT) CAPS capsule TAKE 1 CAPSULE BY MOUTH 3 TIMES A WEEK ON MONDAY- WEDNESDAY- FRIDAY FOR SEVERE VITAMIN D . DEFICIENCY   "

## 2024-03-05 ENCOUNTER — Other Ambulatory Visit: Payer: Self-pay | Admitting: Family Medicine

## 2024-03-05 ENCOUNTER — Encounter (HOSPITAL_BASED_OUTPATIENT_CLINIC_OR_DEPARTMENT_OTHER): Payer: Self-pay | Admitting: Family

## 2024-03-05 DIAGNOSIS — F902 Attention-deficit hyperactivity disorder, combined type: Secondary | ICD-10-CM

## 2024-03-05 MED ORDER — AMPHETAMINE-DEXTROAMPHETAMINE 20 MG PO TABS: 20.0000 mg | ORAL_TABLET | Freq: Every day | ORAL | 0 refills | Status: AC

## 2024-03-05 NOTE — Telephone Encounter (Signed)
Adderall refilled today °

## 2024-03-11 ENCOUNTER — Encounter: Payer: Self-pay | Admitting: Physical Therapy

## 2024-03-11 ENCOUNTER — Ambulatory Visit: Admitting: Physical Therapy

## 2024-03-11 DIAGNOSIS — R252 Cramp and spasm: Secondary | ICD-10-CM

## 2024-03-11 DIAGNOSIS — R102 Pelvic and perineal pain unspecified side: Secondary | ICD-10-CM

## 2024-03-11 DIAGNOSIS — R278 Other lack of coordination: Secondary | ICD-10-CM

## 2024-03-11 NOTE — Therapy (Signed)
 " OUTPATIENT PHYSICAL THERAPY FEMALE PELVIC TREATMENT   Patient Name: Diana Wolfe MRN: [id number] DOB:08/24/1950, 74 y.o., female Today's Date: 03/11/2024     END OF SESSION:  PT End of Session - 03/11/24 1147     Visit Number 18    Date for Recertification  03/27/24    Authorization Type Medicare/ [employer]    Authorization - Visit Number 18    Authorization - Number of Visits 20    PT Start Time 1145    PT Stop Time 1225    PT Time Calculation (min) 40 min    Activity Tolerance Patient tolerated treatment well    Behavior During Therapy WFL for tasks assessed/performed          Past Medical History:  Diagnosis Date   ADD (attention deficit disorder)    Allergy 1988   Anemia    Anxiety    Arthritis 2009   Depression    GERD (gastroesophageal reflux disease)    Hyperlipidemia    Hypertension    Hypothyroid    Osteoporosis    PONV (postoperative nausea and vomiting)    Sleep apnea    using cpap since 2025   Vitamin D  deficiency    Past Surgical History:  Procedure Laterality Date   BREAST BIOPSY Left 07/09/2019   CESAREAN SECTION     ESOPHAGOGASTRODUODENOSCOPY N/A 09/02/2015   Procedure: ESOPHAGOGASTRODUODENOSCOPY (EGD);  Surgeon: Lynwood Bohr, MD;  Location: [facility];  Service: Endoscopy;  Laterality: N/A;   ESOPHAGOGASTRODUODENOSCOPY N/A 06/05/2022   Procedure: ESOPHAGOGASTRODUODENOSCOPY (EGD);  Surgeon: Burnette Fallow, MD;  Location: [facility];  Service: Gastroenterology;  Laterality: N/A;   FOOT SURGERY Right    FOREIGN BODY REMOVAL N/A 06/05/2022   Procedure: FOREIGN BODY REMOVAL;  Surgeon: Burnette Fallow, MD;  Location: [facility];  Service: Gastroenterology;  Laterality: N/A;   FRACTURE SURGERY     IM NAILING TIBIA Left 01/09/2001   Dr. 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   Dysphagia 04/21/2020   Generalized abdominal pain 04/21/2020   Hematochezia 04/21/2020   Left lower quadrant pain 04/21/2020   History of colonic polyps 04/21/2020   Urticaria 04/21/2020   B12 deficiency 01/31/2019   Medication management 06/23/2014   Vaginal atrophy 03/27/2014   Hyperlipidemia    Hypertension    Anxiety    GERD (gastroesophageal reflux disease)    ADD (attention deficit disorder)    Depression    Vitamin D  deficiency    Hypothyroidism 04/25/2008   Iron deficiency anemia 04/25/2008   Obstructive sleep apnea 11/16/2007   Allergic rhinitis 11/16/2007   Headache 11/16/2007   PULMONARY EMBOLISM, HX OF 11/16/2007    PCP: Alvia Corean CROME, FNP  REFERRING PROVIDER: Alvia Corean CROME, FNP   REFERRING DIAG:  [phone] (ICD-10-CM) - Medication management  M62.89 (ICD-10-CM) - Pelvic floor dysfunction in female    THERAPY DIAG:  Cramp and spasm  Other lack of coordination  Pelvic pain  Rationale for Evaluation and Treatment: Rehabilitation  ONSET DATE: 2021  SUBJECTIVE:  SUBJECTIVE STATEMENT: I am still on the 4th dilator and it is more comfortable. I have not had long rides. I have not had huge urgency. I have not used the home TENS unit. I had the strong urgency several weeks ago. I have one more dilator to go for my goal. I am drinking more caffeine for the blood pressure. I have had urgency but no leakage in the underwear.   PAIN:  Are you having pain? Yes NPRS scale: 7/10 Pain location: Vaginal  Pain type:  aching Pain description: intermittent   Aggravating factors: during vaginal penetration Relieving factors: no vaginal penetration  PRECAUTIONS: None  RED FLAGS: None   WEIGHT BEARING RESTRICTIONS: No  FALLS:  Has patient fallen in last 6 months? Yes. Number of falls 1 time slipped on ice.   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  URINATION: Pain with urination: No Fully empty bladder: Yes: sometimes will empty her bladder and 10 minutes have to urinate again Stream: Strong Urgency: Yes , getting out of car, sitting in house 08/07/23: feels the urgency after driving more than 1 hour; no urgency with sitting in house.  Frequency: night time 2-3 times: daytime: 6 times 07/31/23: wake up 2 times at night 08/07/23: wake up 1 time to urinate.  Leakage: Urge to void and Walking to the bathroom 08/07/23: urinary leakage 1 time since last week when she got out of the car and rushed to the bathroom Pads: No  INTERCOURSE:not sexually active at this time but in the future would like to be able to.    PREGNANCY: C-section deliveries 1  PROLAPSE: None   OBJECTIVE:  Note: Objective measures were completed at Evaluation unless otherwise noted.  DIAGNOSTIC FINDINGS:  none  PATIENT SURVEYS:  UIQ-7: 48 PFIQ-7: 44 08/16/23: UIQ-7: 33 PFIQ-7: 28 10/27/23: UIQ-7: 48 PFIQ-7: 44 01/29/24: UIQ-7: 38 PFIQ-7: 43 03/11/24: UIQ-7: 17 PFIQ-7: 19  COGNITION: Overall cognitive status: Within functional limits for tasks assessed     SENSATION: Light touch: Appears intact   POSTURE: decreased lumbar lordosis   LUMBARAROM/PROM:  A/PROM A/PROM  eval A/PROM  10/27/23  Extension Decreased by 50% full    (Blank rows = not tested)  LOWER EXTREMITY ROM: bilateral hip ROM is full   LOWER EXTREMITY MMT:  MMT Right eval Left eval Right 10/27/23 Left 10/27/23  Hip extension 4/5 4/5 5/5 5/5  Hip abduction 4/5 5/5 5/5 5/5   (Blank rows = not tested) PALPATION:    Pelvic Alignment: ASIS  Abdominal: decreased mobility of the tissue below the scar, decreased movement of the diaphragm, difficulty with opening her lower rib cage                External Perineal Exam: dry, redness along the vulva, white patch of skin along the inner left labia majora; Q-tip test was positive at 6, 11 and 1 O'Clock.                              Internal Pelvic Floor: pain with Q-tip therefore did not assess any further, when she bulged therapist did not see any prolapse, the vaginal canal was small  Patient confirms identification and approves PT  to assess internal pelvic floor and treatment Yes  PELVIC MMT:not tested due to the pain   MMT 07/31/23 12/18/23 01/15/24  Vaginal 2/5 3/5 4 s 4/5 2 s  Internal Anal Sphincter     External Anal Sphincter     Puborectalis     Diastasis Recti     (Blank rows = not tested)         TODAY'S TREATMENT:  03/11/24 Exercises: Stretches/mobility: Seated piriformis stretch holding 30 sec each Sitting happy baby 30 sec with diaphragmatic breathing Supine pull the leg across the trunk holding 30 sec each Strengthening: Supine with feet on ball and bring knees to chest 20 x Supine feet on ball curl-up with verbal cues to pull belly in  15 x Supine with feet on ball and bring knees to chest 20 x  with bilateral shoulder extension with green band PLANK against the wall with diagonal hip flexion 20 x  Lunges with holding onto the counter 15 x each side with tactile cues to engage the abdomen    02/28/24 Exercises: Stretches/mobility: Sitting piriformis stretch holding 30 sec each Sitting happy baby holding 30 sec  Supine pull knee across the hip holding 30 sec  each Strengthening: Supine with feet on ball and bring knees to chest 20 x  Supine with feet on ball and bring knees to chest 20 x  with bilateral shoulder extension with green band Supine with feet on ball rotate trunk with green band as resistance 20 x each PLANK against the wall with diagonal hip flexion 20 x  Hands on wall lifting opposite arm and leg 20 x  Lunges with holding onto the counter 15 x each side with tactile cues to engage the abdomen    01/29/24 Neuromuscular re-education: Pelvic floor contraction training: Stand on vibration plate on strength setting for 30 seconds x 3 PLANK against the wall with diagonal hip flexion 20 x  Standing bilateral should extension with green band with alternate marching 20 x  Sitting on ball with shoulder flexion and using green band 15 x  Sitting on ball with green band horizontal abduction 20 x  Sitting on ball horizontal abduction with green band 20 x  Sitting on ball opposite arm and hip flexion 20 x  Supine with feet no ball and bringing ball back and forth engaging the lower abdomen 20 x  Self-care: Therapist has reviewed with patient on using the electrical stimulation for urgency and she is doing it correctly.       PATIENT EDUCATION:  03/11/24 Education details: Access Code: [email or web address], abdominal scar massage Person educated: Patient Education method: Explanation, Demonstration, Tactile cues, Verbal cues, and Handouts Education comprehension: verbalized understanding, returned demonstration, verbal cues required, tactile cues required, and needs further education  HOME EXERCISE PROGRAM: 03/11/24 Access Code: [email or web address] URL: [email or web address] Date: 03/11/2024 Prepared by: Channing Pereyra  Program Notes sit on ball and massage the pelvic floor daily for several minutes.   Exercises - Seated Diaphragmatic Breathing  - 1 x daily - 7 x weekly - 3 sets - 10 reps - Seated Piriformis Stretch with Trunk Bend  - 1 x  daily - 7 x weekly - 1 sets - 2 reps - 30 sec hold - Seated Happy Baby With Trunk Flexion For Pelvic Relaxation  - 1 x daily - 7 x weekly - 1 sets - 1 reps - 30 sec hold - Supine Piriformis Stretch with Leg Straight  - 1 x daily - 7 x weekly - 1  sets - 2 reps - 30 sec hold - Quadruped Pelvic Floor Contraction with Opposite Arm and Leg Lift  - 1 x daily - 1 x weekly - 2 sets - 10 reps - Posterior Chain Stretch  - 1 x daily - 3 x weekly - 1 sets - 5 reps - Butterfly Groin Stretch  - 1 x daily - 3 x weekly - 1 sets - 1 reps - 30 sec hold - Quadruped Yoga Block Lift Off  - 1 x daily - 3 x weekly - 1 sets - 10 reps - Curl Up with Reach  - 1 x daily - 7 x weekly - 3 sets - 10 reps - Hooklying Anti-Rotation Press With Anchored Resistance  - 1 x daily - 7 x weekly - 3 sets - 10 reps - Shoulder Extension with Resistance - 90/90 on Swiss Ball  - 1 x daily - 3 x weekly - 2 sets - 10 reps - Seated Shoulder Flexion with Self-Anchored Resistance  - 1 x daily - 2 x weekly - 1 sets - 15 reps - Seated Shoulder Horizontal Abduction with Resistance - Palms Down  - 1 x daily - 2 x weekly - 1 sets - 20 reps - Seated March with Opposite Arm Flexion on Swiss Ball  - 1 x daily - 2 x weekly - 2 sets - 10 reps - Supine Hamstring Swiss Ball Curls/Dynamic Mobilization  - 1 x daily - 2 x weekly - 2 sets - 10 reps - Full Plank with Hip Flexion/Adduction Knee Drive at [employer]  - 1 x daily - 3 x weekly - 2 sets - 10 reps - Lunge with Pelvic Floor Contraction  - 1 x daily - 3 x weekly - 1 sets - 10 reps - Modified Bird Dog At Wall  - 1 x daily - 3 x weekly - 1 sets - 10 reps   ASSESSMENT:  CLINICAL IMPRESSION: Patient is a 74 y.o. female who was seen today for physical therapy  treatment for pelvis floor dysfunction.  Patient has not leaked urine as she is walking to the bathroom for several weeks. She has the urgency to urinate but is able to walk to the bathroom slowly. She reports less urgency. She is on the 4th dilator and  having less difficulty with it.  Urinary urgency is 75% better and she is using the Home TENS unit to assist with the control of the urinary urgency. Patient is independent with her HEP and ready for discharge.   OBJECTIVE IMPAIRMENTS: decreased coordination, decreased ROM, decreased strength, increased fascial restrictions, increased muscle spasms, impaired tone, and pain.   ACTIVITY LIMITATIONS: continence, toileting, and locomotion level  PARTICIPATION LIMITATIONS: interpersonal relationship, driving, shopping, and community activity  PERSONAL FACTORS: Fitness, Time since onset of injury/illness/exacerbation, and 1-2 comorbidities: Hypothyroid; Hypertension; Cesarean section are also affecting patient's functional outcome.   REHAB POTENTIAL: Good  CLINICAL DECISION MAKING: Evolving/moderate complexity  EVALUATION COMPLEXITY: Moderate   GOALS: Goals reviewed with patient? Yes  SHORT TERM GOALS: Target date: 06/22/23  Patient educated on vaginal moisturizers and understands how they are used and why to improve the health of the vaginal tissue.  Baseline: Goal status: Met 05/29/23  2.  Patient is able to perform diaphragmatic breathing to elongate her pelvic floor.  Baseline:  Goal status: met 07/24/23  3.  Patient understands was to perform perineal massage externally to start to relax the pelvic floor tissue.  Baseline:  Goal status: Met 07/31/23  4.  Patient is independent with flexibility exercises to indirectly elongate the pelvic floor.  Baseline:  Goal status: Met 07/24/23  5.  Patient educated on vaginal dilators to assist with the expansion of the pelvic floor.  Baseline:  Goal status: met 08/16/23  LONG TERM GOALS: Target date: 03/27/24  Patient independent with advanced HEP to increase strength of core and pelvic floor.  Baseline:  Goal status: Met 03/11/24  2.  Patient is able to use the dilator that is appropriate for her goal to have vaginal penetration with pain level  </= 0-2/10.  Baseline: Patient is on the 4th one Goal status: Met 03/11/24  3.  Patient pelvic floor strength increased to reduce urinary leakage with urgency.  Baseline: urgency decreased by 75% Goal status: met 03/11/24  4.  Patient is able to use the behavioral technique to reduce the urge to void so she is able to slowly walk to the bathroom without leaking urine and enter her house.  Baseline:  Goal status: Met 01/29/24  5.  Patient reports she is able to urinate 1-2 times at night due to using the urge to void technique.  Baseline:  Goal status: Met 08/07/23   PLAN:Discharge to HEP this visit.    Channing Pereyra, PT 03/11/24 12:28 PM   PHYSICAL THERAPY DISCHARGE SUMMARY  Visits from Start of Care: 18  Current functional level related to goals / functional outcomes: See above.    Remaining deficits: See above.    Education / Equipment: HEP   Patient agrees to discharge. Patient goals were met. Patient is being discharged due to meeting the stated rehab goals. Thank you for the referral.   Channing Pereyra, PT 03/11/24 12:28 PM   "

## 2024-03-20 ENCOUNTER — Ambulatory Visit (HOSPITAL_BASED_OUTPATIENT_CLINIC_OR_DEPARTMENT_OTHER)

## 2024-03-20 DIAGNOSIS — R5383 Other fatigue: Secondary | ICD-10-CM

## 2024-03-20 DIAGNOSIS — G4733 Obstructive sleep apnea (adult) (pediatric): Secondary | ICD-10-CM | POA: Diagnosis not present

## 2024-03-20 DIAGNOSIS — I10 Essential (primary) hypertension: Secondary | ICD-10-CM | POA: Diagnosis not present

## 2024-03-21 ENCOUNTER — Encounter: Payer: Self-pay | Admitting: Family Medicine

## 2024-03-21 LAB — ECHOCARDIOGRAM COMPLETE
Area-P 1/2: 3.72 cm2
S' Lateral: 1.96 cm

## 2024-03-22 ENCOUNTER — Ambulatory Visit (HOSPITAL_BASED_OUTPATIENT_CLINIC_OR_DEPARTMENT_OTHER): Payer: Self-pay | Admitting: Family

## 2024-03-22 ENCOUNTER — Other Ambulatory Visit: Payer: Self-pay

## 2024-03-22 ENCOUNTER — Other Ambulatory Visit: Payer: Self-pay | Admitting: Family Medicine

## 2024-03-22 DIAGNOSIS — E039 Hypothyroidism, unspecified: Secondary | ICD-10-CM

## 2024-03-22 MED ORDER — LEVOTHYROXINE SODIUM 75 MCG PO TABS: ORAL_TABLET | ORAL | 3 refills | Status: DC

## 2024-03-27 ENCOUNTER — Ambulatory Visit: Admitting: Physical Therapy

## 2024-03-29 ENCOUNTER — Ambulatory Visit: Admission: RE | Admit: 2024-03-29 | Source: Ambulatory Visit

## 2024-03-29 ENCOUNTER — Other Ambulatory Visit: Payer: Self-pay | Admitting: Family Medicine

## 2024-03-29 ENCOUNTER — Encounter: Payer: Self-pay | Admitting: *Deleted

## 2024-03-29 DIAGNOSIS — Z1231 Encounter for screening mammogram for malignant neoplasm of breast: Secondary | ICD-10-CM

## 2024-03-29 LAB — AMB RESULTS CONSOLE CBG: Glucose: 137

## 2024-03-29 NOTE — Progress Notes (Signed)
 Pt has insurance, doesn't smoke. Blood glucose non fasting 137mg /dl.

## 2024-04-01 ENCOUNTER — Encounter (HOSPITAL_BASED_OUTPATIENT_CLINIC_OR_DEPARTMENT_OTHER): Admitting: Family

## 2024-05-27 ENCOUNTER — Ambulatory Visit: Admitting: Family Medicine

## 2024-06-05 ENCOUNTER — Encounter (HOSPITAL_BASED_OUTPATIENT_CLINIC_OR_DEPARTMENT_OTHER): Admitting: Cardiovascular Disease

## 2024-06-24 ENCOUNTER — Ambulatory Visit: Admitting: Family Medicine

## 2024-10-29 ENCOUNTER — Ambulatory Visit
